# Patient Record
Sex: Male | Born: 1935 | Race: White | Hispanic: No | Smoking: Former smoker
Health system: Southern US, Community
[De-identification: ages and names within clinical notes are randomized; demographics above are authoritative.]

## PROBLEM LIST (undated history)

## (undated) DIAGNOSIS — N183 Chronic kidney disease, stage 3 unspecified: Secondary | ICD-10-CM

## (undated) DIAGNOSIS — C8590 Non-Hodgkin lymphoma, unspecified, unspecified site: Secondary | ICD-10-CM

## (undated) DIAGNOSIS — K2100 Gastro-esophageal reflux disease with esophagitis, without bleeding: Secondary | ICD-10-CM

## (undated) DIAGNOSIS — Z87442 Personal history of urinary calculi: Secondary | ICD-10-CM

## (undated) DIAGNOSIS — R05 Cough: Secondary | ICD-10-CM

## (undated) DIAGNOSIS — E538 Deficiency of other specified B group vitamins: Secondary | ICD-10-CM

## (undated) DIAGNOSIS — G4730 Sleep apnea, unspecified: Secondary | ICD-10-CM

## (undated) DIAGNOSIS — N1330 Unspecified hydronephrosis: Secondary | ICD-10-CM

## (undated) DIAGNOSIS — R059 Cough, unspecified: Secondary | ICD-10-CM

## (undated) DIAGNOSIS — H9190 Unspecified hearing loss, unspecified ear: Secondary | ICD-10-CM

## (undated) DIAGNOSIS — N400 Enlarged prostate without lower urinary tract symptoms: Secondary | ICD-10-CM

## (undated) DIAGNOSIS — J189 Pneumonia, unspecified organism: Secondary | ICD-10-CM

## (undated) DIAGNOSIS — D649 Anemia, unspecified: Secondary | ICD-10-CM

## (undated) DIAGNOSIS — Z87448 Personal history of other diseases of urinary system: Secondary | ICD-10-CM

## (undated) DIAGNOSIS — Q7649 Other congenital malformations of spine, not associated with scoliosis: Secondary | ICD-10-CM

## (undated) DIAGNOSIS — C8300 Small cell B-cell lymphoma, unspecified site: Secondary | ICD-10-CM

## (undated) DIAGNOSIS — K5790 Diverticulosis of intestine, part unspecified, without perforation or abscess without bleeding: Secondary | ICD-10-CM

## (undated) HISTORY — PX: INGUINAL HERNIA REPAIR: SUR1180

## (undated) HISTORY — PX: EYE SURGERY: SHX253

## (undated) HISTORY — DX: Small cell B-cell lymphoma, unspecified site: C83.00

## (undated) HISTORY — DX: Other congenital malformations of spine, not associated with scoliosis: Q76.49

## (undated) HISTORY — PX: PROSTATE SURGERY: SHX751

## (undated) HISTORY — DX: Gastro-esophageal reflux disease with esophagitis, without bleeding: K21.00

## (undated) HISTORY — DX: Personal history of other diseases of urinary system: Z87.448

## (undated) HISTORY — DX: Diverticulosis of intestine, part unspecified, without perforation or abscess without bleeding: K57.90

## (undated) HISTORY — DX: Unspecified hydronephrosis: N13.30

## (undated) HISTORY — DX: Benign prostatic hyperplasia without lower urinary tract symptoms: N40.0

## (undated) HISTORY — DX: Pneumonia, unspecified organism: J18.9

## (undated) HISTORY — PX: CHOLECYSTECTOMY: SHX55

## (undated) HISTORY — DX: Unspecified hearing loss, unspecified ear: H91.90

## (undated) HISTORY — DX: Anemia, unspecified: D64.9

## (undated) HISTORY — DX: Gastro-esophageal reflux disease with esophagitis: K21.0

## (undated) HISTORY — DX: Deficiency of other specified B group vitamins: E53.8

---

## 1999-12-12 ENCOUNTER — Other Ambulatory Visit: Admission: RE | Admit: 1999-12-12 | Discharge: 1999-12-12 | Payer: Self-pay | Admitting: Gastroenterology

## 1999-12-12 ENCOUNTER — Encounter (INDEPENDENT_AMBULATORY_CARE_PROVIDER_SITE_OTHER): Payer: Self-pay | Admitting: Specialist

## 1999-12-12 ENCOUNTER — Encounter (INDEPENDENT_AMBULATORY_CARE_PROVIDER_SITE_OTHER): Payer: Self-pay | Admitting: *Deleted

## 2002-04-28 ENCOUNTER — Emergency Department (HOSPITAL_COMMUNITY): Admission: EM | Admit: 2002-04-28 | Discharge: 2002-04-28 | Payer: Self-pay | Admitting: Emergency Medicine

## 2002-04-28 ENCOUNTER — Encounter: Payer: Self-pay | Admitting: Emergency Medicine

## 2004-10-04 ENCOUNTER — Encounter (INDEPENDENT_AMBULATORY_CARE_PROVIDER_SITE_OTHER): Payer: Self-pay | Admitting: Gastroenterology

## 2007-09-01 ENCOUNTER — Encounter: Admission: RE | Admit: 2007-09-01 | Discharge: 2007-09-01 | Payer: Self-pay | Admitting: Internal Medicine

## 2007-09-01 ENCOUNTER — Encounter: Payer: Self-pay | Admitting: Pulmonary Disease

## 2007-10-19 ENCOUNTER — Encounter (INDEPENDENT_AMBULATORY_CARE_PROVIDER_SITE_OTHER): Payer: Self-pay | Admitting: *Deleted

## 2007-10-19 ENCOUNTER — Ambulatory Visit (HOSPITAL_COMMUNITY): Admission: RE | Admit: 2007-10-19 | Discharge: 2007-10-19 | Payer: Self-pay | Admitting: *Deleted

## 2007-11-30 ENCOUNTER — Ambulatory Visit (HOSPITAL_COMMUNITY): Admission: RE | Admit: 2007-11-30 | Discharge: 2007-11-30 | Payer: Self-pay | Admitting: *Deleted

## 2008-04-12 ENCOUNTER — Encounter: Admission: RE | Admit: 2008-04-12 | Discharge: 2008-04-12 | Payer: Self-pay | Admitting: Internal Medicine

## 2008-04-21 ENCOUNTER — Ambulatory Visit (HOSPITAL_COMMUNITY): Admission: RE | Admit: 2008-04-21 | Discharge: 2008-04-21 | Payer: Self-pay | Admitting: Internal Medicine

## 2008-04-21 ENCOUNTER — Encounter (INDEPENDENT_AMBULATORY_CARE_PROVIDER_SITE_OTHER): Payer: Self-pay | Admitting: Interventional Radiology

## 2008-04-28 ENCOUNTER — Ambulatory Visit: Payer: Self-pay | Admitting: Oncology

## 2008-05-06 ENCOUNTER — Encounter (HOSPITAL_COMMUNITY): Payer: Self-pay | Admitting: Oncology

## 2008-05-06 ENCOUNTER — Other Ambulatory Visit: Admission: RE | Admit: 2008-05-06 | Discharge: 2008-05-06 | Payer: Self-pay | Admitting: Oncology

## 2008-05-27 LAB — COMPREHENSIVE METABOLIC PANEL
ALT: 11 U/L (ref 0–53)
CO2: 25 mEq/L (ref 19–32)
Sodium: 138 mEq/L (ref 135–145)
Total Bilirubin: 0.6 mg/dL (ref 0.3–1.2)
Total Protein: 6.1 g/dL (ref 6.0–8.3)

## 2008-05-27 LAB — CBC WITH DIFFERENTIAL/PLATELET
BASO%: 1.2 % (ref 0.0–2.0)
LYMPH%: 63.4 % — ABNORMAL HIGH (ref 14.0–48.0)
MCHC: 34.2 g/dL (ref 32.0–35.9)
MONO#: 0.7 10*3/uL (ref 0.1–0.9)
RBC: 4.53 10*6/uL (ref 4.20–5.71)
RDW: 14.9 % — ABNORMAL HIGH (ref 11.2–14.6)
WBC: 9.8 10*3/uL (ref 4.0–10.0)
lymph#: 6.2 10*3/uL — ABNORMAL HIGH (ref 0.9–3.3)

## 2008-05-27 LAB — LACTATE DEHYDROGENASE: LDH: 266 U/L — ABNORMAL HIGH (ref 94–250)

## 2008-06-07 LAB — CBC WITH DIFFERENTIAL/PLATELET
Basophils Absolute: 0 10*3/uL (ref 0.0–0.1)
EOS%: 0.6 % (ref 0.0–7.0)
HCT: 40.2 % (ref 38.7–49.9)
HGB: 14.1 g/dL (ref 13.0–17.1)
LYMPH%: 38 % (ref 14.0–48.0)
MCH: 31.2 pg (ref 28.0–33.4)
MCV: 89.1 fL (ref 81.6–98.0)
MONO%: 8.8 % (ref 0.0–13.0)
NEUT%: 52.2 % (ref 40.0–75.0)
RDW: 15.2 % — ABNORMAL HIGH (ref 11.2–14.6)

## 2008-06-14 ENCOUNTER — Ambulatory Visit: Payer: Self-pay | Admitting: Oncology

## 2008-06-14 LAB — CBC WITH DIFFERENTIAL/PLATELET
Basophils Absolute: 0 10*3/uL (ref 0.0–0.1)
Eosinophils Absolute: 0 10*3/uL (ref 0.0–0.5)
HCT: 38.7 % (ref 38.7–49.9)
HGB: 13.4 g/dL (ref 13.0–17.1)
MCH: 31.3 pg (ref 28.0–33.4)
MCV: 90.7 fL (ref 81.6–98.0)
MONO%: 6.6 % (ref 0.0–13.0)
NEUT#: 3.4 10*3/uL (ref 1.5–6.5)
NEUT%: 71.5 % (ref 40.0–75.0)
Platelets: 328 10*3/uL (ref 145–400)
RDW: 16.7 % — ABNORMAL HIGH (ref 11.2–14.6)

## 2008-06-21 LAB — CBC WITH DIFFERENTIAL/PLATELET
Basophils Absolute: 0 10*3/uL (ref 0.0–0.1)
EOS%: 0.3 % (ref 0.0–7.0)
Eosinophils Absolute: 0 10*3/uL (ref 0.0–0.5)
HCT: 37.7 % — ABNORMAL LOW (ref 38.7–49.9)
HGB: 13.1 g/dL (ref 13.0–17.1)
MCH: 31.6 pg (ref 28.0–33.4)
NEUT#: 1.4 10*3/uL — ABNORMAL LOW (ref 1.5–6.5)
NEUT%: 57 % (ref 40.0–75.0)
lymph#: 0.7 10*3/uL — ABNORMAL LOW (ref 0.9–3.3)

## 2008-06-27 LAB — CBC WITH DIFFERENTIAL/PLATELET
Basophils Absolute: 0.1 10*3/uL (ref 0.0–0.1)
EOS%: 0.4 % (ref 0.0–7.0)
Eosinophils Absolute: 0 10*3/uL (ref 0.0–0.5)
HCT: 37.7 % — ABNORMAL LOW (ref 38.7–49.9)
HGB: 12.7 g/dL — ABNORMAL LOW (ref 13.0–17.1)
LYMPH%: 25.7 % (ref 14.0–48.0)
MCH: 31.2 pg (ref 28.0–33.4)
MCV: 92.4 fL (ref 81.6–98.0)
MONO%: 15.1 % — ABNORMAL HIGH (ref 0.0–13.0)
NEUT%: 57.3 % (ref 40.0–75.0)
Platelets: 186 10*3/uL (ref 145–400)
RDW: 16.3 % — ABNORMAL HIGH (ref 11.2–14.6)

## 2008-06-27 LAB — COMPREHENSIVE METABOLIC PANEL
AST: 15 U/L (ref 0–37)
Alkaline Phosphatase: 72 U/L (ref 39–117)
BUN: 17 mg/dL (ref 6–23)
Creatinine, Ser: 1.04 mg/dL (ref 0.40–1.50)
Glucose, Bld: 117 mg/dL — ABNORMAL HIGH (ref 70–99)
Total Bilirubin: 0.5 mg/dL (ref 0.3–1.2)

## 2008-06-27 LAB — URIC ACID: Uric Acid, Serum: 6.2 mg/dL (ref 4.0–7.8)

## 2008-07-05 LAB — CBC WITH DIFFERENTIAL/PLATELET
BASO%: 0.1 % (ref 0.0–2.0)
Eosinophils Absolute: 0 10*3/uL (ref 0.0–0.5)
HCT: 39.5 % (ref 38.7–49.9)
LYMPH%: 7.1 % — ABNORMAL LOW (ref 14.0–48.0)
MONO#: 0.4 10*3/uL (ref 0.1–0.9)
NEUT#: 7.8 10*3/uL — ABNORMAL HIGH (ref 1.5–6.5)
NEUT%: 87.7 % — ABNORMAL HIGH (ref 40.0–75.0)
Platelets: 189 10*3/uL (ref 145–400)
WBC: 8.9 10*3/uL (ref 4.0–10.0)
lymph#: 0.6 10*3/uL — ABNORMAL LOW (ref 0.9–3.3)

## 2008-07-12 LAB — CBC WITH DIFFERENTIAL/PLATELET
Basophils Absolute: 0 10*3/uL (ref 0.0–0.1)
Eosinophils Absolute: 0 10*3/uL (ref 0.0–0.5)
HGB: 14.2 g/dL (ref 13.0–17.1)
MCV: 95.6 fL (ref 81.6–98.0)
MONO#: 0.4 10*3/uL (ref 0.1–0.9)
NEUT#: 6.1 10*3/uL (ref 1.5–6.5)
RDW: 19.3 % — ABNORMAL HIGH (ref 11.2–14.6)
WBC: 7.4 10*3/uL (ref 4.0–10.0)
lymph#: 0.8 10*3/uL — ABNORMAL LOW (ref 0.9–3.3)

## 2008-07-25 LAB — CBC WITH DIFFERENTIAL/PLATELET
BASO%: 1.8 % (ref 0.0–2.0)
EOS%: 0.8 % (ref 0.0–7.0)
HCT: 42.6 % (ref 38.7–49.9)
MCH: 32.8 pg (ref 28.0–33.4)
MCHC: 34.6 g/dL (ref 32.0–35.9)
MONO#: 1.1 10*3/uL — ABNORMAL HIGH (ref 0.1–0.9)
NEUT%: 58.7 % (ref 40.0–75.0)
RDW: 16.9 % — ABNORMAL HIGH (ref 11.2–14.6)
WBC: 5.4 10*3/uL (ref 4.0–10.0)
lymph#: 1.1 10*3/uL (ref 0.9–3.3)

## 2008-07-25 LAB — COMPREHENSIVE METABOLIC PANEL
ALT: 16 U/L (ref 0–53)
CO2: 24 mEq/L (ref 19–32)
Calcium: 9.2 mg/dL (ref 8.4–10.5)
Chloride: 102 mEq/L (ref 96–112)
Creatinine, Ser: 1.03 mg/dL (ref 0.40–1.50)
Glucose, Bld: 102 mg/dL — ABNORMAL HIGH (ref 70–99)
Sodium: 137 mEq/L (ref 135–145)
Total Bilirubin: 0.6 mg/dL (ref 0.3–1.2)
Total Protein: 6.3 g/dL (ref 6.0–8.3)

## 2008-07-25 LAB — LACTATE DEHYDROGENASE: LDH: 204 U/L (ref 94–250)

## 2008-07-25 LAB — URIC ACID: Uric Acid, Serum: 6.2 mg/dL (ref 4.0–7.8)

## 2008-08-04 ENCOUNTER — Ambulatory Visit: Payer: Self-pay | Admitting: Oncology

## 2008-08-08 LAB — CBC WITH DIFFERENTIAL/PLATELET
BASO%: 0 % (ref 0.0–2.0)
Basophils Absolute: 0 10*3/uL (ref 0.0–0.1)
EOS%: 0.2 % (ref 0.0–7.0)
Eosinophils Absolute: 0 10*3/uL (ref 0.0–0.5)
HCT: 42.5 % (ref 38.7–49.9)
HGB: 14.6 g/dL (ref 13.0–17.1)
LYMPH%: 5 % — ABNORMAL LOW (ref 14.0–48.0)
MCH: 33.9 pg — ABNORMAL HIGH (ref 28.0–33.4)
MCHC: 34.3 g/dL (ref 32.0–35.9)
MCV: 98.8 fL — ABNORMAL HIGH (ref 81.6–98.0)
MONO#: 0.5 10*3/uL (ref 0.1–0.9)
MONO%: 5.3 % (ref 0.0–13.0)
NEUT#: 7.7 10*3/uL — ABNORMAL HIGH (ref 1.5–6.5)
NEUT%: 89.5 % — ABNORMAL HIGH (ref 40.0–75.0)
Platelets: 180 10*3/uL (ref 145–400)
RBC: 4.3 10*6/uL (ref 4.20–5.71)
RDW: 19.5 % — ABNORMAL HIGH (ref 11.2–14.6)
WBC: 8.6 10*3/uL (ref 4.0–10.0)
lymph#: 0.4 10*3/uL — ABNORMAL LOW (ref 0.9–3.3)

## 2008-08-22 LAB — CBC WITH DIFFERENTIAL/PLATELET
BASO%: 2.9 % — ABNORMAL HIGH (ref 0.0–2.0)
EOS%: 0.6 % (ref 0.0–7.0)
HCT: 41.8 % (ref 38.7–49.9)
MCH: 33 pg (ref 28.0–33.4)
MCHC: 34.8 g/dL (ref 32.0–35.9)
NEUT%: 67.5 % (ref 40.0–75.0)
RDW: 16 % — ABNORMAL HIGH (ref 11.2–14.6)
lymph#: 0.6 10*3/uL — ABNORMAL LOW (ref 0.9–3.3)

## 2008-08-22 LAB — COMPREHENSIVE METABOLIC PANEL
ALT: 19 U/L (ref 0–53)
CO2: 23 mEq/L (ref 19–32)
Calcium: 9 mg/dL (ref 8.4–10.5)
Chloride: 107 mEq/L (ref 96–112)
Sodium: 139 mEq/L (ref 135–145)
Total Protein: 5.9 g/dL — ABNORMAL LOW (ref 6.0–8.3)

## 2008-08-22 LAB — LACTATE DEHYDROGENASE: LDH: 308 U/L — ABNORMAL HIGH (ref 94–250)

## 2008-09-05 LAB — CBC WITH DIFFERENTIAL/PLATELET
BASO%: 0.7 % (ref 0.0–2.0)
Basophils Absolute: 0.1 10*3/uL (ref 0.0–0.1)
EOS%: 0.2 % (ref 0.0–7.0)
HGB: 14.7 g/dL (ref 13.0–17.1)
MCH: 33.8 pg — ABNORMAL HIGH (ref 28.0–33.4)
RBC: 4.36 10*6/uL (ref 4.20–5.71)
RDW: 17.2 % — ABNORMAL HIGH (ref 11.2–14.6)
lymph#: 0.8 10*3/uL — ABNORMAL LOW (ref 0.9–3.3)

## 2008-09-14 ENCOUNTER — Ambulatory Visit (HOSPITAL_COMMUNITY): Admission: RE | Admit: 2008-09-14 | Discharge: 2008-09-14 | Payer: Self-pay | Admitting: Oncology

## 2008-09-16 LAB — COMPREHENSIVE METABOLIC PANEL
BUN: 16 mg/dL (ref 6–23)
CO2: 24 mEq/L (ref 19–32)
Calcium: 9.2 mg/dL (ref 8.4–10.5)
Chloride: 104 mEq/L (ref 96–112)
Creatinine, Ser: 1 mg/dL (ref 0.40–1.50)
Glucose, Bld: 113 mg/dL — ABNORMAL HIGH (ref 70–99)

## 2008-09-16 LAB — CBC WITH DIFFERENTIAL/PLATELET
Basophils Absolute: 0 10*3/uL (ref 0.0–0.1)
Eosinophils Absolute: 0 10*3/uL (ref 0.0–0.5)
HCT: 44.6 % (ref 38.7–49.9)
HGB: 15.4 g/dL (ref 13.0–17.1)
MCH: 33.9 pg — ABNORMAL HIGH (ref 28.0–33.4)
MONO#: 0.6 10*3/uL (ref 0.1–0.9)
NEUT%: 70.9 % (ref 40.0–75.0)
lymph#: 0.7 10*3/uL — ABNORMAL LOW (ref 0.9–3.3)

## 2008-09-16 LAB — LACTATE DEHYDROGENASE: LDH: 212 U/L (ref 94–250)

## 2008-09-19 ENCOUNTER — Ambulatory Visit: Payer: Self-pay | Admitting: Oncology

## 2008-10-17 LAB — COMPREHENSIVE METABOLIC PANEL
BUN: 16 mg/dL (ref 6–23)
CO2: 25 mEq/L (ref 19–32)
Calcium: 9 mg/dL (ref 8.4–10.5)
Chloride: 105 mEq/L (ref 96–112)
Creatinine, Ser: 1.09 mg/dL (ref 0.40–1.50)
Glucose, Bld: 96 mg/dL (ref 70–99)

## 2008-10-17 LAB — LACTATE DEHYDROGENASE: LDH: 205 U/L (ref 94–250)

## 2008-10-17 LAB — CBC WITH DIFFERENTIAL/PLATELET
Basophils Absolute: 0 10*3/uL (ref 0.0–0.1)
EOS%: 1.7 % (ref 0.0–7.0)
Eosinophils Absolute: 0 10*3/uL (ref 0.0–0.5)
HCT: 41.1 % (ref 38.7–49.9)
HGB: 14.3 g/dL (ref 13.0–17.1)
MCH: 32.1 pg (ref 28.0–33.4)
NEUT%: 49.5 % (ref 40.0–75.0)
lymph#: 0.4 10*3/uL — ABNORMAL LOW (ref 0.9–3.3)

## 2008-10-24 LAB — CBC WITH DIFFERENTIAL/PLATELET
Basophils Absolute: 0 10*3/uL (ref 0.0–0.1)
Eosinophils Absolute: 0 10*3/uL (ref 0.0–0.5)
HGB: 14.6 g/dL (ref 13.0–17.1)
NEUT#: 1 10*3/uL — ABNORMAL LOW (ref 1.5–6.5)
RBC: 4.45 10*6/uL (ref 4.20–5.71)
RDW: 14.1 % (ref 11.2–14.6)
WBC: 2.3 10*3/uL — ABNORMAL LOW (ref 4.0–10.0)
lymph#: 0.8 10*3/uL — ABNORMAL LOW (ref 0.9–3.3)

## 2008-10-31 LAB — CBC WITH DIFFERENTIAL/PLATELET
Basophils Absolute: 0 10*3/uL (ref 0.0–0.1)
Eosinophils Absolute: 0 10*3/uL (ref 0.0–0.5)
HGB: 14.4 g/dL (ref 13.0–17.1)
LYMPH%: 19.3 % (ref 14.0–48.0)
MCV: 93.6 fL (ref 81.6–98.0)
MONO#: 0.6 10*3/uL (ref 0.1–0.9)
MONO%: 18.3 % — ABNORMAL HIGH (ref 0.0–13.0)
NEUT#: 2 10*3/uL (ref 1.5–6.5)
Platelets: 195 10*3/uL (ref 145–400)
RDW: 13.8 % (ref 11.2–14.6)
WBC: 3.3 10*3/uL — ABNORMAL LOW (ref 4.0–10.0)

## 2008-11-07 ENCOUNTER — Ambulatory Visit: Payer: Self-pay | Admitting: Oncology

## 2008-11-07 LAB — CBC WITH DIFFERENTIAL/PLATELET
BASO%: 0.5 % (ref 0.0–2.0)
HCT: 43.8 % (ref 38.7–49.9)
HGB: 15.2 g/dL (ref 13.0–17.1)
MCHC: 34.7 g/dL (ref 32.0–35.9)
MONO#: 0.7 10*3/uL (ref 0.1–0.9)
NEUT%: 71.9 % (ref 40.0–75.0)
WBC: 5 10*3/uL (ref 4.0–10.0)
lymph#: 0.7 10*3/uL — ABNORMAL LOW (ref 0.9–3.3)

## 2008-11-14 LAB — CBC WITH DIFFERENTIAL/PLATELET
BASO%: 1.4 % (ref 0.0–2.0)
Basophils Absolute: 0 10*3/uL (ref 0.0–0.1)
Eosinophils Absolute: 0 10*3/uL (ref 0.0–0.5)
HCT: 40.7 % (ref 38.7–49.9)
HGB: 14.4 g/dL (ref 13.0–17.1)
MCHC: 35.2 g/dL (ref 32.0–35.9)
MONO#: 0.7 10*3/uL (ref 0.1–0.9)
NEUT#: 1 10*3/uL — ABNORMAL LOW (ref 1.5–6.5)
NEUT%: 41.3 % (ref 40.0–75.0)
WBC: 2.4 10*3/uL — ABNORMAL LOW (ref 4.0–10.0)
lymph#: 0.7 10*3/uL — ABNORMAL LOW (ref 0.9–3.3)

## 2008-11-23 ENCOUNTER — Ambulatory Visit (HOSPITAL_COMMUNITY): Admission: RE | Admit: 2008-11-23 | Discharge: 2008-11-23 | Payer: Self-pay | Admitting: Oncology

## 2008-11-30 LAB — CBC WITH DIFFERENTIAL/PLATELET
Basophils Absolute: 0.1 10*3/uL (ref 0.0–0.1)
Eosinophils Absolute: 0.1 10*3/uL (ref 0.0–0.5)
HCT: 44.6 % (ref 38.7–49.9)
HGB: 15.4 g/dL (ref 13.0–17.1)
MCV: 88.2 fL (ref 81.6–98.0)
MONO%: 17.5 % — ABNORMAL HIGH (ref 0.0–13.0)
NEUT#: 2.7 10*3/uL (ref 1.5–6.5)
RDW: 12.8 % (ref 11.2–14.6)

## 2008-11-30 LAB — COMPREHENSIVE METABOLIC PANEL
Albumin: 4 g/dL (ref 3.5–5.2)
CO2: 23 mEq/L (ref 19–32)
Glucose, Bld: 96 mg/dL (ref 70–99)
Potassium: 4.2 mEq/L (ref 3.5–5.3)
Sodium: 140 mEq/L (ref 135–145)
Total Bilirubin: 0.7 mg/dL (ref 0.3–1.2)
Total Protein: 6.1 g/dL (ref 6.0–8.3)

## 2008-11-30 LAB — URIC ACID: Uric Acid, Serum: 6.5 mg/dL (ref 4.0–7.8)

## 2009-01-06 ENCOUNTER — Ambulatory Visit: Payer: Self-pay | Admitting: Oncology

## 2009-01-10 LAB — CBC WITH DIFFERENTIAL/PLATELET
Basophils Absolute: 0 10*3/uL (ref 0.0–0.1)
EOS%: 1.9 % (ref 0.0–7.0)
HCT: 44 % (ref 38.7–49.9)
HGB: 15.3 g/dL (ref 13.0–17.1)
MCH: 30.8 pg (ref 28.0–33.4)
MCHC: 34.8 g/dL (ref 32.0–35.9)
MCV: 88.5 fL (ref 81.6–98.0)
MONO%: 11.2 % (ref 0.0–13.0)
NEUT%: 69.8 % (ref 40.0–75.0)
RDW: 14.6 % (ref 11.2–14.6)

## 2009-01-10 LAB — COMPREHENSIVE METABOLIC PANEL
AST: 21 U/L (ref 0–37)
Alkaline Phosphatase: 86 U/L (ref 39–117)
BUN: 18 mg/dL (ref 6–23)
Creatinine, Ser: 0.99 mg/dL (ref 0.40–1.50)

## 2009-02-21 ENCOUNTER — Ambulatory Visit: Payer: Self-pay | Admitting: Oncology

## 2009-02-21 LAB — COMPREHENSIVE METABOLIC PANEL
ALT: 28 U/L (ref 0–53)
Alkaline Phosphatase: 92 U/L (ref 39–117)
Glucose, Bld: 138 mg/dL — ABNORMAL HIGH (ref 70–99)
Sodium: 140 mEq/L (ref 135–145)
Total Bilirubin: 0.8 mg/dL (ref 0.3–1.2)
Total Protein: 6.2 g/dL (ref 6.0–8.3)

## 2009-02-21 LAB — CBC WITH DIFFERENTIAL/PLATELET
Basophils Absolute: 0 10*3/uL (ref 0.0–0.1)
EOS%: 1.5 % (ref 0.0–7.0)
Eosinophils Absolute: 0 10*3/uL (ref 0.0–0.5)
HCT: 46.9 % (ref 38.4–49.9)
HGB: 15.9 g/dL (ref 13.0–17.1)
LYMPH%: 30.7 % (ref 14.0–49.0)
MCHC: 33.9 g/dL (ref 32.0–36.0)
MONO#: 0.5 10*3/uL (ref 0.1–0.9)
NEUT#: 0.9 10*3/uL — ABNORMAL LOW (ref 1.5–6.5)
NEUT%: 43.9 % (ref 39.0–75.0)
Platelets: 125 10*3/uL — ABNORMAL LOW (ref 140–400)
WBC: 2.1 10*3/uL — ABNORMAL LOW (ref 4.0–10.3)
lymph#: 0.6 10*3/uL — ABNORMAL LOW (ref 0.9–3.3)
nRBC: 0 % (ref 0–0)

## 2009-02-21 LAB — LACTATE DEHYDROGENASE: LDH: 206 U/L (ref 94–250)

## 2009-03-22 ENCOUNTER — Ambulatory Visit (HOSPITAL_COMMUNITY): Admission: RE | Admit: 2009-03-22 | Discharge: 2009-03-22 | Payer: Self-pay | Admitting: Oncology

## 2009-03-27 LAB — CBC WITH DIFFERENTIAL/PLATELET
Eosinophils Absolute: 0.1 10*3/uL (ref 0.0–0.5)
HCT: 47.9 % (ref 38.4–49.9)
LYMPH%: 17.5 % (ref 14.0–49.0)
MCHC: 35.1 g/dL (ref 32.0–36.0)
MCV: 87.9 fL (ref 79.3–98.0)
MONO#: 0.8 10*3/uL (ref 0.1–0.9)
MONO%: 16.6 % — ABNORMAL HIGH (ref 0.0–14.0)
NEUT#: 3 10*3/uL (ref 1.5–6.5)
NEUT%: 64.2 % (ref 39.0–75.0)
Platelets: 147 10*3/uL (ref 140–400)
RBC: 5.45 10*6/uL (ref 4.20–5.82)

## 2009-03-27 LAB — COMPREHENSIVE METABOLIC PANEL
Alkaline Phosphatase: 91 U/L (ref 39–117)
BUN: 19 mg/dL (ref 6–23)
CO2: 26 mEq/L (ref 19–32)
Creatinine, Ser: 0.99 mg/dL (ref 0.40–1.50)
Glucose, Bld: 89 mg/dL (ref 70–99)
Sodium: 142 mEq/L (ref 135–145)
Total Bilirubin: 0.8 mg/dL (ref 0.3–1.2)
Total Protein: 6.3 g/dL (ref 6.0–8.3)

## 2009-03-27 LAB — LACTATE DEHYDROGENASE: LDH: 201 U/L (ref 94–250)

## 2009-06-22 ENCOUNTER — Ambulatory Visit: Payer: Self-pay | Admitting: Oncology

## 2009-06-26 ENCOUNTER — Ambulatory Visit (HOSPITAL_COMMUNITY): Admission: RE | Admit: 2009-06-26 | Discharge: 2009-06-26 | Payer: Self-pay | Admitting: Oncology

## 2009-06-26 ENCOUNTER — Encounter: Payer: Self-pay | Admitting: Pulmonary Disease

## 2009-06-26 LAB — CBC WITH DIFFERENTIAL/PLATELET
Basophils Absolute: 0 10*3/uL (ref 0.0–0.1)
EOS%: 0.6 % (ref 0.0–7.0)
HCT: 46.1 % (ref 38.4–49.9)
HGB: 15.7 g/dL (ref 13.0–17.1)
MCH: 30.4 pg (ref 27.2–33.4)
MONO#: 1.1 10*3/uL — ABNORMAL HIGH (ref 0.1–0.9)
NEUT%: 36.9 % — ABNORMAL LOW (ref 39.0–75.0)
Platelets: 163 10*3/uL (ref 140–400)
lymph#: 0.8 10*3/uL — ABNORMAL LOW (ref 0.9–3.3)

## 2009-07-17 ENCOUNTER — Ambulatory Visit: Payer: Self-pay | Admitting: Pulmonary Disease

## 2009-07-17 DIAGNOSIS — R05 Cough: Secondary | ICD-10-CM

## 2009-07-17 DIAGNOSIS — R059 Cough, unspecified: Secondary | ICD-10-CM | POA: Insufficient documentation

## 2009-07-25 ENCOUNTER — Ambulatory Visit: Payer: Self-pay | Admitting: Oncology

## 2009-07-26 ENCOUNTER — Encounter: Payer: Self-pay | Admitting: Pulmonary Disease

## 2009-07-26 ENCOUNTER — Encounter: Admission: RE | Admit: 2009-07-26 | Discharge: 2009-09-06 | Payer: Self-pay | Admitting: Pulmonary Disease

## 2009-07-27 LAB — CBC WITH DIFFERENTIAL/PLATELET
Basophils Absolute: 0 10*3/uL (ref 0.0–0.1)
Eosinophils Absolute: 0.1 10*3/uL (ref 0.0–0.5)
HCT: 42.1 % (ref 38.4–49.9)
HGB: 14.3 g/dL (ref 13.0–17.1)
LYMPH%: 20.1 % (ref 14.0–49.0)
MONO#: 1.2 10*3/uL — ABNORMAL HIGH (ref 0.1–0.9)
NEUT#: 2.4 10*3/uL (ref 1.5–6.5)
NEUT%: 51.6 % (ref 39.0–75.0)
Platelets: 214 10*3/uL (ref 140–400)
RBC: 4.75 10*6/uL (ref 4.20–5.82)
WBC: 4.7 10*3/uL (ref 4.0–10.3)
nRBC: 0 % (ref 0–0)

## 2009-07-27 LAB — COMPREHENSIVE METABOLIC PANEL
AST: 13 U/L (ref 0–37)
Alkaline Phosphatase: 78 U/L (ref 39–117)
BUN: 16 mg/dL (ref 6–23)
Creatinine, Ser: 0.94 mg/dL (ref 0.40–1.50)
Glucose, Bld: 101 mg/dL — ABNORMAL HIGH (ref 70–99)
Total Bilirubin: 0.5 mg/dL (ref 0.3–1.2)

## 2009-07-28 ENCOUNTER — Telehealth: Payer: Self-pay | Admitting: Pulmonary Disease

## 2009-08-30 ENCOUNTER — Ambulatory Visit: Payer: Self-pay | Admitting: Internal Medicine

## 2009-09-01 ENCOUNTER — Encounter: Payer: Self-pay | Admitting: Internal Medicine

## 2009-09-07 ENCOUNTER — Telehealth: Payer: Self-pay | Admitting: Pulmonary Disease

## 2009-09-11 ENCOUNTER — Ambulatory Visit: Payer: Self-pay | Admitting: Pulmonary Disease

## 2009-09-11 DIAGNOSIS — J4489 Other specified chronic obstructive pulmonary disease: Secondary | ICD-10-CM | POA: Insufficient documentation

## 2009-09-11 DIAGNOSIS — J449 Chronic obstructive pulmonary disease, unspecified: Secondary | ICD-10-CM

## 2009-09-26 ENCOUNTER — Telehealth: Payer: Self-pay | Admitting: Pulmonary Disease

## 2009-10-03 ENCOUNTER — Ambulatory Visit: Payer: Self-pay | Admitting: Gastroenterology

## 2009-10-03 ENCOUNTER — Encounter (INDEPENDENT_AMBULATORY_CARE_PROVIDER_SITE_OTHER): Payer: Self-pay | Admitting: *Deleted

## 2009-10-03 DIAGNOSIS — K220 Achalasia of cardia: Secondary | ICD-10-CM

## 2009-10-19 ENCOUNTER — Ambulatory Visit (HOSPITAL_COMMUNITY): Admission: RE | Admit: 2009-10-19 | Discharge: 2009-10-19 | Payer: Self-pay | Admitting: Gastroenterology

## 2009-10-19 ENCOUNTER — Ambulatory Visit: Payer: Self-pay | Admitting: Gastroenterology

## 2009-10-25 ENCOUNTER — Ambulatory Visit: Payer: Self-pay | Admitting: Oncology

## 2009-10-27 LAB — COMPREHENSIVE METABOLIC PANEL
AST: 29 U/L (ref 0–37)
Albumin: 3.7 g/dL (ref 3.5–5.2)
Alkaline Phosphatase: 127 U/L — ABNORMAL HIGH (ref 39–117)
Calcium: 8.9 mg/dL (ref 8.4–10.5)
Chloride: 103 mEq/L (ref 96–112)
Potassium: 3.9 mEq/L (ref 3.5–5.3)
Sodium: 140 mEq/L (ref 135–145)
Total Protein: 5.8 g/dL — ABNORMAL LOW (ref 6.0–8.3)

## 2009-10-27 LAB — CBC WITH DIFFERENTIAL/PLATELET
Basophils Absolute: 0 10*3/uL (ref 0.0–0.1)
EOS%: 3.1 % (ref 0.0–7.0)
Eosinophils Absolute: 0.2 10*3/uL (ref 0.0–0.5)
HGB: 15 g/dL (ref 13.0–17.1)
MCH: 30.1 pg (ref 27.2–33.4)
MCV: 89.9 fL (ref 79.3–98.0)
MONO%: 9.1 % (ref 0.0–14.0)
NEUT#: 5.4 10*3/uL (ref 1.5–6.5)
RBC: 4.99 10*6/uL (ref 4.20–5.82)
RDW: 15.8 % — ABNORMAL HIGH (ref 11.0–14.6)
lymph#: 1 10*3/uL (ref 0.9–3.3)

## 2009-11-21 ENCOUNTER — Ambulatory Visit: Payer: Self-pay | Admitting: Gastroenterology

## 2010-01-24 ENCOUNTER — Ambulatory Visit: Payer: Self-pay | Admitting: Oncology

## 2010-01-26 LAB — CBC WITH DIFFERENTIAL/PLATELET
Eosinophils Absolute: 0.2 10*3/uL (ref 0.0–0.5)
HCT: 43.2 % (ref 38.4–49.9)
HGB: 14.3 g/dL (ref 13.0–17.1)
MCH: 29.7 pg (ref 27.2–33.4)
MCHC: 33.1 g/dL (ref 32.0–36.0)
MCV: 89.6 fL (ref 79.3–98.0)
NEUT#: 6.8 10*3/uL — ABNORMAL HIGH (ref 1.5–6.5)
Platelets: 209 10*3/uL (ref 140–400)
RDW: 14.7 % — ABNORMAL HIGH (ref 11.0–14.6)
lymph#: 0.9 10*3/uL (ref 0.9–3.3)

## 2010-01-26 LAB — COMPREHENSIVE METABOLIC PANEL
BUN: 22 mg/dL (ref 6–23)
Calcium: 8.9 mg/dL (ref 8.4–10.5)
Chloride: 104 mEq/L (ref 96–112)
Creatinine, Ser: 0.93 mg/dL (ref 0.40–1.50)

## 2010-03-04 ENCOUNTER — Inpatient Hospital Stay (HOSPITAL_COMMUNITY): Admission: EM | Admit: 2010-03-04 | Discharge: 2010-03-07 | Payer: Self-pay | Admitting: Emergency Medicine

## 2010-03-05 ENCOUNTER — Encounter (INDEPENDENT_AMBULATORY_CARE_PROVIDER_SITE_OTHER): Payer: Self-pay | Admitting: Internal Medicine

## 2010-03-05 ENCOUNTER — Ambulatory Visit: Payer: Self-pay | Admitting: Vascular Surgery

## 2010-03-06 ENCOUNTER — Encounter (INDEPENDENT_AMBULATORY_CARE_PROVIDER_SITE_OTHER): Payer: Self-pay | Admitting: Internal Medicine

## 2010-03-07 ENCOUNTER — Ambulatory Visit: Payer: Self-pay | Admitting: Oncology

## 2010-03-14 ENCOUNTER — Encounter: Admission: RE | Admit: 2010-03-14 | Discharge: 2010-04-16 | Payer: Self-pay | Admitting: Internal Medicine

## 2010-04-24 ENCOUNTER — Ambulatory Visit: Payer: Self-pay | Admitting: Oncology

## 2010-04-26 LAB — CBC WITH DIFFERENTIAL/PLATELET
Basophils Absolute: 0 10*3/uL (ref 0.0–0.1)
EOS%: 6.2 % (ref 0.0–7.0)
MONO#: 1 10*3/uL — ABNORMAL HIGH (ref 0.1–0.9)
Platelets: 169 10*3/uL (ref 140–400)
RBC: 5.16 10*6/uL (ref 4.20–5.82)
RDW: 14.9 % — ABNORMAL HIGH (ref 11.0–14.6)
WBC: 7.6 10*3/uL (ref 4.0–10.3)
nRBC: 0 % (ref 0–0)

## 2010-04-26 LAB — COMPREHENSIVE METABOLIC PANEL
AST: 16 U/L (ref 0–37)
CO2: 24 mEq/L (ref 19–32)
Chloride: 104 mEq/L (ref 96–112)
Creatinine, Ser: 0.92 mg/dL (ref 0.40–1.50)
Potassium: 4 mEq/L (ref 3.5–5.3)

## 2010-07-25 ENCOUNTER — Ambulatory Visit: Payer: Self-pay | Admitting: Oncology

## 2010-07-27 LAB — CBC WITH DIFFERENTIAL/PLATELET
Basophils Absolute: 0 10*3/uL (ref 0.0–0.1)
Eosinophils Absolute: 0.2 10*3/uL (ref 0.0–0.5)
HGB: 14.7 g/dL (ref 13.0–17.1)
MCHC: 32.7 g/dL (ref 32.0–36.0)
MCV: 89.8 fL (ref 79.3–98.0)
MONO#: 0.9 10*3/uL (ref 0.1–0.9)
Platelets: 152 10*3/uL (ref 140–400)
RDW: 15.1 % — ABNORMAL HIGH (ref 11.0–14.6)
lymph#: 1.2 10*3/uL (ref 0.9–3.3)

## 2010-07-27 LAB — COMPREHENSIVE METABOLIC PANEL
ALT: 13 U/L (ref 0–53)
Alkaline Phosphatase: 99 U/L (ref 39–117)
CO2: 27 mEq/L (ref 19–32)
Calcium: 8.7 mg/dL (ref 8.4–10.5)
Chloride: 105 mEq/L (ref 96–112)
Sodium: 141 mEq/L (ref 135–145)
Total Protein: 5.6 g/dL — ABNORMAL LOW (ref 6.0–8.3)

## 2010-10-16 ENCOUNTER — Ambulatory Visit: Payer: Self-pay | Admitting: Oncology

## 2010-10-18 ENCOUNTER — Encounter: Payer: Self-pay | Admitting: Pulmonary Disease

## 2010-10-18 LAB — CBC WITH DIFFERENTIAL/PLATELET
BASO%: 0.3 % (ref 0.0–2.0)
Eosinophils Absolute: 0.2 10*3/uL (ref 0.0–0.5)
HCT: 45 % (ref 38.4–49.9)
HGB: 14.9 g/dL (ref 13.0–17.1)
LYMPH%: 13.6 % — ABNORMAL LOW (ref 14.0–49.0)
MCV: 90.5 fL (ref 79.3–98.0)
MONO%: 11.9 % (ref 0.0–14.0)
NEUT#: 5.5 10*3/uL (ref 1.5–6.5)
RDW: 15 % — ABNORMAL HIGH (ref 11.0–14.6)
WBC: 7.6 10*3/uL (ref 4.0–10.3)
nRBC: 0 % (ref 0–0)

## 2010-10-19 LAB — URIC ACID: Uric Acid, Serum: 6 mg/dL (ref 4.0–7.8)

## 2010-10-19 LAB — LACTATE DEHYDROGENASE: LDH: 145 U/L (ref 94–250)

## 2010-10-19 LAB — COMPREHENSIVE METABOLIC PANEL
Calcium: 9.2 mg/dL (ref 8.4–10.5)
Glucose, Bld: 155 mg/dL — ABNORMAL HIGH (ref 70–99)
Potassium: 4.2 mEq/L (ref 3.5–5.3)

## 2010-10-30 ENCOUNTER — Encounter: Admission: RE | Admit: 2010-10-30 | Discharge: 2010-10-30 | Payer: Self-pay | Admitting: Allergy

## 2010-11-30 ENCOUNTER — Ambulatory Visit: Payer: Self-pay | Admitting: Gastroenterology

## 2010-12-30 ENCOUNTER — Encounter: Payer: Self-pay | Admitting: Internal Medicine

## 2011-01-10 NOTE — Letter (Signed)
Summary: [facility]  [facility]   Imported By: Lennie Odor 11/02/2010 12:39:42  _____________________________________________________________________  External Attachment:    Type:   Image     Comment:   External Document

## 2011-01-10 NOTE — Assessment & Plan Note (Signed)
  Review of gastrointestinal problems: 1. Presumed achalasia. Previously a patient of Dr. Virginia Rochester with extensive workup (Failed manometry testing, the catheter coiled in his esophagus)  . Most recent EGD November, 2010 found tight GE junction, slightly dilated distal esophagus, findings consistent with achalasia, botulinum toxin was injected.  Injection improved his swallowing significantly however he is still careful about what he eats, eats slowly, takes small bites, chews his food very well.   History of Present Illness Visit Type: Follow-up Visit Primary GI MD: Rob Bunting MD Primary Provider: Pearson Grippe, MD  Requesting Provider: n/a Chief Complaint: Follow up for  reflux and cough History of Present Illness:     very pleasant 77 year old man whom I last saw over a year ago. Recently he was found to sinusitis, perhaps contributing to his cough.  His swallowing is "alright," .  Swallowing is no longer a problem for him. He eats slowly, chews his food well.  the cough is usually worse at night, BEFORE he lays down.  Eating does not cause him to cough.           Current Medications (verified): 1)  Glucosamine 500 Mg Caps (Glucosamine Sulfate) .... Once Daily 2)  Prevacid 30 Mg Cpdr (Lansoprazole) .... 2 Once Daily 3)  Finasteride 5 Mg Tabs (Finasteride) .Marland Kitchen.. 1 By Mouth Once Daily 4)  Avelox 400 Mg Tabs (Moxifloxacin Hcl) .Marland Kitchen.. 1 By Mouth Once Daily  Allergies (verified): No Known Drug Allergies  Vital Signs:  Patient profile:   77 year old male Height:      65 inches Weight:      141 pounds BMI:     23.55 BSA:     1.71 Pulse rate:   80 / minute Pulse rhythm:   irregular BP sitting:   92 / 52  (left arm)  Vitals Entered By: Merri Ray CMA Duncan Dull) (November 30, 2010 3:24 PM)  Physical Exam  Additional Exam:  Constitutional: generally well appearing Psychiatric: alert and oriented times 3 Abdomen: soft, non-tender, non-distended, normal bowel sounds    Impression &  Recommendations:  Problem # 1:  Achalasia he did not have any significant swallowing difficulties anymore. If he does, he will call and I would like to repeat EGD with Botox injection. It is not clear if his cough, which is the biggest complaint, is related to his achalasia. Recently he was told he had sinusitis and is finishing antibiotics for it.  Patient Instructions: 1)  should stay on prevacid once daily. 2)  Call Dr. Christella Hartigan if you start to have swallowing troubles again. 3)  A copy of this information will be sent to Dr. Selena Batten. 4)  The medication list was reviewed and reconciled.  All changed / newly prescribed medications were explained.  A complete medication list was provided to the patient / caregiver.

## 2011-01-18 ENCOUNTER — Other Ambulatory Visit (HOSPITAL_COMMUNITY): Payer: Self-pay | Admitting: Oncology

## 2011-01-18 ENCOUNTER — Encounter (HOSPITAL_BASED_OUTPATIENT_CLINIC_OR_DEPARTMENT_OTHER): Payer: Medicare Other | Admitting: Oncology

## 2011-01-18 DIAGNOSIS — Z5112 Encounter for antineoplastic immunotherapy: Secondary | ICD-10-CM

## 2011-01-18 DIAGNOSIS — C8589 Other specified types of non-Hodgkin lymphoma, extranodal and solid organ sites: Secondary | ICD-10-CM

## 2011-01-18 DIAGNOSIS — C8590 Non-Hodgkin lymphoma, unspecified, unspecified site: Secondary | ICD-10-CM

## 2011-01-18 LAB — CBC WITH DIFFERENTIAL/PLATELET
LYMPH%: 17.1 % (ref 14.0–49.0)
MCH: 29.6 pg (ref 27.2–33.4)
NEUT#: 6.9 10*3/uL — ABNORMAL HIGH (ref 1.5–6.5)
Platelets: 215 10*3/uL (ref 140–400)

## 2011-01-18 LAB — COMPREHENSIVE METABOLIC PANEL
Albumin: 3.8 g/dL (ref 3.5–5.2)
Alkaline Phosphatase: 119 U/L — ABNORMAL HIGH (ref 39–117)
BUN: 14 mg/dL (ref 6–23)
CO2: 27 mEq/L (ref 19–32)
Chloride: 100 mEq/L (ref 96–112)
Creatinine, Ser: 0.95 mg/dL (ref 0.40–1.50)
Glucose, Bld: 133 mg/dL — ABNORMAL HIGH (ref 70–99)
Potassium: 4.1 mEq/L (ref 3.5–5.3)
Total Bilirubin: 0.7 mg/dL (ref 0.3–1.2)

## 2011-01-18 LAB — URIC ACID: Uric Acid, Serum: 5.8 mg/dL (ref 4.0–7.8)

## 2011-02-12 ENCOUNTER — Other Ambulatory Visit: Payer: Self-pay | Admitting: Dermatology

## 2011-02-15 ENCOUNTER — Other Ambulatory Visit: Payer: Self-pay | Admitting: Allergy

## 2011-02-15 ENCOUNTER — Ambulatory Visit
Admission: RE | Admit: 2011-02-15 | Discharge: 2011-02-15 | Disposition: A | Discharge status: Home / Self Care | Payer: Medicare Other | Source: Ambulatory Visit | Attending: Allergy | Admitting: Allergy

## 2011-02-15 DIAGNOSIS — R05 Cough: Secondary | ICD-10-CM

## 2011-03-03 LAB — CBC
HCT: 38.1 % — ABNORMAL LOW (ref 39.0–52.0)
Hemoglobin: 13.3 g/dL (ref 13.0–17.0)
MCHC: 34.3 g/dL (ref 30.0–36.0)
MCV: 90.1 fL (ref 78.0–100.0)
Platelets: 199 10*3/uL (ref 150–400)
RBC: 4.3 MIL/uL (ref 4.22–5.81)
RDW: 15.3 % (ref 11.5–15.5)
WBC: 5.5 10*3/uL (ref 4.0–10.5)

## 2011-03-03 LAB — LIPID PANEL
Cholesterol: 123 mg/dL (ref 0–200)
Total CHOL/HDL Ratio: 2.6 RATIO

## 2011-03-03 LAB — CULTURE, BLOOD (ROUTINE X 2): Culture: NO GROWTH

## 2011-03-03 LAB — T3: T3, Total: 72.3 ng/dl — ABNORMAL LOW (ref 80.0–204.0)

## 2011-03-03 LAB — COMPREHENSIVE METABOLIC PANEL
ALT: 32 U/L (ref 0–53)
Albumin: 2.7 g/dL — ABNORMAL LOW (ref 3.5–5.2)
Alkaline Phosphatase: 112 U/L (ref 39–117)
BUN: 12 mg/dL (ref 6–23)
Chloride: 102 mEq/L (ref 96–112)
Glucose, Bld: 124 mg/dL — ABNORMAL HIGH (ref 70–99)
Potassium: 4.1 mEq/L (ref 3.5–5.1)
Sodium: 135 mEq/L (ref 135–145)
Total Bilirubin: 0.6 mg/dL (ref 0.3–1.2)

## 2011-03-03 LAB — DIFFERENTIAL
Basophils Absolute: 0.1 10*3/uL (ref 0.0–0.1)
Basophils Relative: 1 % (ref 0–1)
Lymphocytes Relative: 10 % — ABNORMAL LOW (ref 12–46)
Neutro Abs: 8.9 10*3/uL — ABNORMAL HIGH (ref 1.7–7.7)

## 2011-03-03 LAB — POCT I-STAT, CHEM 8
BUN: 15 mg/dL (ref 6–23)
Creatinine, Ser: 0.9 mg/dL (ref 0.4–1.5)
Sodium: 137 mEq/L (ref 135–145)
TCO2: 28 mmol/L (ref 0–100)

## 2011-03-03 LAB — T4, FREE: Free T4: 1.03 ng/dL (ref 0.80–1.80)

## 2011-04-15 ENCOUNTER — Other Ambulatory Visit (HOSPITAL_COMMUNITY): Payer: Self-pay | Admitting: Oncology

## 2011-04-15 ENCOUNTER — Encounter (HOSPITAL_BASED_OUTPATIENT_CLINIC_OR_DEPARTMENT_OTHER): Payer: Medicare Other | Admitting: Oncology

## 2011-04-15 ENCOUNTER — Encounter (HOSPITAL_COMMUNITY): Payer: Self-pay

## 2011-04-15 ENCOUNTER — Ambulatory Visit (HOSPITAL_COMMUNITY)
Admission: RE | Admit: 2011-04-15 | Discharge: 2011-04-15 | Disposition: A | Discharge status: Home / Self Care | Payer: Medicare Other | Source: Ambulatory Visit | Attending: Oncology | Admitting: Oncology

## 2011-04-15 DIAGNOSIS — C8599 Non-Hodgkin lymphoma, unspecified, extranodal and solid organ sites: Secondary | ICD-10-CM

## 2011-04-15 DIAGNOSIS — R599 Enlarged lymph nodes, unspecified: Secondary | ICD-10-CM | POA: Insufficient documentation

## 2011-04-15 DIAGNOSIS — C8590 Non-Hodgkin lymphoma, unspecified, unspecified site: Secondary | ICD-10-CM

## 2011-04-15 DIAGNOSIS — N200 Calculus of kidney: Secondary | ICD-10-CM | POA: Insufficient documentation

## 2011-04-15 DIAGNOSIS — C8589 Other specified types of non-Hodgkin lymphoma, extranodal and solid organ sites: Secondary | ICD-10-CM

## 2011-04-15 DIAGNOSIS — N1330 Unspecified hydronephrosis: Secondary | ICD-10-CM | POA: Insufficient documentation

## 2011-04-15 DIAGNOSIS — N400 Enlarged prostate without lower urinary tract symptoms: Secondary | ICD-10-CM | POA: Insufficient documentation

## 2011-04-15 HISTORY — DX: Non-Hodgkin lymphoma, unspecified, unspecified site: C85.90

## 2011-04-15 LAB — CMP (CANCER CENTER ONLY)
AST: 24 U/L (ref 11–38)
Albumin: 3.2 g/dL — ABNORMAL LOW (ref 3.3–5.5)
BUN, Bld: 14 mg/dL (ref 7–22)
CO2: 29 mEq/L (ref 18–33)
Calcium: 9 mg/dL (ref 8.0–10.3)
Chloride: 101 mEq/L (ref 98–108)
Creat: 0.7 mg/dl (ref 0.6–1.2)
Potassium: 4.5 mEq/L (ref 3.3–4.7)

## 2011-04-15 LAB — CBC WITH DIFFERENTIAL/PLATELET
Basophils Absolute: 0 10*3/uL (ref 0.0–0.1)
Eosinophils Absolute: 0.1 10*3/uL (ref 0.0–0.5)
HCT: 46 % (ref 38.4–49.9)
HGB: 15.4 g/dL (ref 13.0–17.1)
MCH: 29.5 pg (ref 27.2–33.4)
MONO#: 0.7 10*3/uL (ref 0.1–0.9)
NEUT#: 5.6 10*3/uL (ref 1.5–6.5)
NEUT%: 74 % (ref 39.0–75.0)
RDW: 17.4 % — ABNORMAL HIGH (ref 11.0–14.6)
WBC: 7.5 10*3/uL (ref 4.0–10.3)
lymph#: 1.2 10*3/uL (ref 0.9–3.3)

## 2011-04-15 LAB — LACTATE DEHYDROGENASE: LDH: 156 U/L (ref 94–250)

## 2011-04-15 MED ORDER — IOHEXOL 300 MG/ML  SOLN
100.0000 mL | Freq: Once | INTRAMUSCULAR | Status: AC | PRN
  Administered 2011-04-15: 100 mL via INTRAVENOUS

## 2011-04-19 ENCOUNTER — Encounter (HOSPITAL_BASED_OUTPATIENT_CLINIC_OR_DEPARTMENT_OTHER): Payer: Medicare Other | Admitting: Oncology

## 2011-04-19 DIAGNOSIS — Z5112 Encounter for antineoplastic immunotherapy: Secondary | ICD-10-CM

## 2011-04-19 DIAGNOSIS — R609 Edema, unspecified: Secondary | ICD-10-CM

## 2011-04-19 DIAGNOSIS — C8589 Other specified types of non-Hodgkin lymphoma, extranodal and solid organ sites: Secondary | ICD-10-CM

## 2011-04-23 NOTE — Op Note (Signed)
NAME:  IVAL, PACER NO.:  [id number]   MEDICAL RECORD NO.:  [id number]          PATIENT TYPE:  AMB   LOCATION:  ENDO                         FACILITY:  [facility]   PHYSICIAN:  Georgiana Spinner, M.D.    DATE OF BIRTH:  1918-12-18   DATE OF PROCEDURE:  10/19/2007  DATE OF DISCHARGE:                               OPERATIVE REPORT   PROCEDURE:  Upper endoscopy with dilation.   INDICATIONS:  Dysphagia with abnormal barium swallow indicating a near  complete obstruction of the distal esophagus with diffuse esophageal  spasm.   ANESTHESIA:  Fentanyl 50 mcg, Versed 6 mg.   DESCRIPTION OF PROCEDURE:  With the patient mildly sedated in the left  lateral decubitus position the Pentax videoscopic endoscope was inserted  in the mouth passed under direct vision into the esophagus.  It was  noted that he did have spasm as we advanced the endoscope through the  esophagus areas would open and close in front of [place].  As we got to the  distal esophagus it appeared to be fairly tightly closed, but we were  able to get the endoscope through it with a pop. We advanced through  the stomach, fundus, body, antrum, duodenal bulb, second portion of the  duodenum all appeared normal.  Photographs were taken.  From this point  the endoscope was slowly withdrawn taking circumferential views of  duodenal mucosa until the endoscope had been pulled back, and into  stomach placed in retroflexion to view the stomach from below.  The  endoscope was then straightened and the guidewire was passed.  The  endoscope was withdrawn.  Subsequently Savary dilators 10 and 12 were  passed.  With the latter I noted some resistance so I elected to  withdraw the guidewire with it and reinserted the endoscope.  There  appeared to be some blood in the distal esophagus.  No other pathology  noted, and the endoscope was withdrawn.  The patient's vital signs and  pulse oximeter remained stable.  The patient tolerated  procedure well  without apparent complications.   FINDINGS:  Dilation of the distal esophageal narrowing to 10 and 12  Savary.   PLAN:  Will schedule the patient for esophageal manometry to evaluate  further and have patient follow up with me as an outpatient.  Will  assess clinical response to dilation as well.           ______________________________  Georgiana Spinner, M.D.     GMO/MEDQ  D:  10/19/2007  T:  10/19/2007  Job:  [id number]

## 2011-08-13 ENCOUNTER — Other Ambulatory Visit (HOSPITAL_COMMUNITY): Payer: Self-pay | Admitting: Oncology

## 2011-08-13 ENCOUNTER — Encounter (HOSPITAL_BASED_OUTPATIENT_CLINIC_OR_DEPARTMENT_OTHER): Payer: Medicare Other | Admitting: Oncology

## 2011-08-13 DIAGNOSIS — C8589 Other specified types of non-Hodgkin lymphoma, extranodal and solid organ sites: Secondary | ICD-10-CM

## 2011-08-13 DIAGNOSIS — Z5112 Encounter for antineoplastic immunotherapy: Secondary | ICD-10-CM

## 2011-08-13 DIAGNOSIS — R05 Cough: Secondary | ICD-10-CM

## 2011-08-13 LAB — COMPREHENSIVE METABOLIC PANEL
AST: 24 U/L (ref 0–37)
Albumin: 3.3 g/dL — ABNORMAL LOW (ref 3.5–5.2)
BUN: 18 mg/dL (ref 6–23)
Calcium: 8.8 mg/dL (ref 8.4–10.5)
Chloride: 103 mEq/L (ref 96–112)
Creatinine, Ser: 0.97 mg/dL (ref 0.50–1.35)
Glucose, Bld: 133 mg/dL — ABNORMAL HIGH (ref 70–99)
Potassium: 4 mEq/L (ref 3.5–5.3)

## 2011-08-13 LAB — CBC WITH DIFFERENTIAL/PLATELET
BASO%: 0.5 % (ref 0.0–2.0)
Basophils Absolute: 0.1 10*3/uL (ref 0.0–0.1)
HCT: 41.2 % (ref 38.4–49.9)
HGB: 13.7 g/dL (ref 13.0–17.1)
LYMPH%: 14.2 % (ref 14.0–49.0)
MCH: 29.5 pg (ref 27.2–33.4)
MCHC: 33.3 g/dL (ref 32.0–36.0)
MONO#: 0.9 10*3/uL (ref 0.1–0.9)
NEUT%: 75 % (ref 39.0–75.0)
Platelets: 212 10*3/uL (ref 140–400)
WBC: 10.5 10*3/uL — ABNORMAL HIGH (ref 4.0–10.3)
lymph#: 1.5 10*3/uL (ref 0.9–3.3)

## 2011-08-14 ENCOUNTER — Encounter (HOSPITAL_BASED_OUTPATIENT_CLINIC_OR_DEPARTMENT_OTHER): Payer: Medicare Other | Admitting: Oncology

## 2011-08-14 DIAGNOSIS — C8589 Other specified types of non-Hodgkin lymphoma, extranodal and solid organ sites: Secondary | ICD-10-CM

## 2011-08-14 DIAGNOSIS — Z5112 Encounter for antineoplastic immunotherapy: Secondary | ICD-10-CM

## 2011-11-04 ENCOUNTER — Other Ambulatory Visit: Payer: Self-pay | Admitting: Dermatology

## 2011-11-08 ENCOUNTER — Other Ambulatory Visit: Payer: Self-pay | Admitting: Nurse Practitioner

## 2011-11-10 ENCOUNTER — Other Ambulatory Visit: Payer: Self-pay | Admitting: Oncology

## 2011-11-10 DIAGNOSIS — C8300 Small cell B-cell lymphoma, unspecified site: Secondary | ICD-10-CM | POA: Insufficient documentation

## 2011-11-11 ENCOUNTER — Encounter: Payer: Self-pay | Admitting: Oncology

## 2011-11-12 ENCOUNTER — Other Ambulatory Visit: Payer: Self-pay | Admitting: *Deleted

## 2011-11-12 ENCOUNTER — Ambulatory Visit (HOSPITAL_BASED_OUTPATIENT_CLINIC_OR_DEPARTMENT_OTHER): Payer: Medicare Other

## 2011-11-12 ENCOUNTER — Other Ambulatory Visit (HOSPITAL_BASED_OUTPATIENT_CLINIC_OR_DEPARTMENT_OTHER): Payer: Medicare Other | Admitting: Lab

## 2011-11-12 ENCOUNTER — Telehealth: Payer: Self-pay | Admitting: Oncology

## 2011-11-12 ENCOUNTER — Ambulatory Visit (HOSPITAL_BASED_OUTPATIENT_CLINIC_OR_DEPARTMENT_OTHER): Payer: Medicare Other | Admitting: Oncology

## 2011-11-12 VITALS — BP 105/59 | HR 71 | Temp 96.8°F | Ht 63.5 in | Wt 141.0 lb

## 2011-11-12 VITALS — BP 108/68 | HR 84 | Temp 97.5°F | Ht 63.5 in | Wt 141.4 lb

## 2011-11-12 DIAGNOSIS — J449 Chronic obstructive pulmonary disease, unspecified: Secondary | ICD-10-CM

## 2011-11-12 DIAGNOSIS — C8300 Small cell B-cell lymphoma, unspecified site: Secondary | ICD-10-CM

## 2011-11-12 DIAGNOSIS — C8583 Other specified types of non-Hodgkin lymphoma, intra-abdominal lymph nodes: Secondary | ICD-10-CM

## 2011-11-12 DIAGNOSIS — Z5112 Encounter for antineoplastic immunotherapy: Secondary | ICD-10-CM

## 2011-11-12 DIAGNOSIS — R05 Cough: Secondary | ICD-10-CM

## 2011-11-12 DIAGNOSIS — C8599 Non-Hodgkin lymphoma, unspecified, extranodal and solid organ sites: Secondary | ICD-10-CM

## 2011-11-12 LAB — COMPREHENSIVE METABOLIC PANEL
BUN: 23 mg/dL (ref 6–23)
CO2: 25 mEq/L (ref 19–32)
Calcium: 8.6 mg/dL (ref 8.4–10.5)
Chloride: 105 mEq/L (ref 96–112)
Creatinine, Ser: 1.06 mg/dL (ref 0.50–1.35)

## 2011-11-12 LAB — CBC WITH DIFFERENTIAL/PLATELET
Basophils Absolute: 0 10*3/uL (ref 0.0–0.1)
EOS%: 0.9 % (ref 0.0–7.0)
Eosinophils Absolute: 0.1 10*3/uL (ref 0.0–0.5)
HCT: 42.1 % (ref 38.4–49.9)
HGB: 13.7 g/dL (ref 13.0–17.1)
MCH: 28.8 pg (ref 27.2–33.4)
MONO#: 1.6 10*3/uL — ABNORMAL HIGH (ref 0.1–0.9)
NEUT#: 11 10*3/uL — ABNORMAL HIGH (ref 1.5–6.5)
NEUT%: 75.8 % — ABNORMAL HIGH (ref 39.0–75.0)
RDW: 15 % — ABNORMAL HIGH (ref 11.0–14.6)
WBC: 14.5 10*3/uL — ABNORMAL HIGH (ref 4.0–10.3)
lymph#: 1.7 10*3/uL (ref 0.9–3.3)

## 2011-11-12 LAB — LACTATE DEHYDROGENASE: LDH: 286 U/L — ABNORMAL HIGH (ref 94–250)

## 2011-11-12 MED ORDER — ACETAMINOPHEN 325 MG PO TABS
650.0000 mg | ORAL_TABLET | Freq: Once | ORAL | Status: AC
  Administered 2011-11-12: 650 mg via ORAL

## 2011-11-12 MED ORDER — DEXAMETHASONE SODIUM PHOSPHATE 10 MG/ML IJ SOLN
20.0000 mg | Freq: Once | INTRAMUSCULAR | Status: AC
  Administered 2011-11-12: 20 mg via INTRAVENOUS

## 2011-11-12 MED ORDER — SODIUM CHLORIDE 0.9 % IV SOLN
375.0000 mg/m2 | Freq: Once | INTRAVENOUS | Status: DC
  Filled 2011-11-12: qty 60

## 2011-11-12 MED ORDER — SODIUM CHLORIDE 0.9 % IV SOLN
Freq: Once | INTRAVENOUS | Status: AC
  Administered 2011-11-12: 12:00:00 via INTRAVENOUS

## 2011-11-12 MED ORDER — SODIUM CHLORIDE 0.9 % IV SOLN
375.0000 mg/m2 | Freq: Once | INTRAVENOUS | Status: AC
  Administered 2011-11-12: 600 mg via INTRAVENOUS
  Filled 2011-11-12: qty 60

## 2011-11-12 MED ORDER — FAMOTIDINE IN NACL 20-0.9 MG/50ML-% IV SOLN
20.0000 mg | Freq: Once | INTRAVENOUS | Status: AC
  Administered 2011-11-12: 20 mg via INTRAVENOUS

## 2011-11-12 MED ORDER — DIPHENHYDRAMINE HCL 25 MG PO CAPS
25.0000 mg | ORAL_CAPSULE | Freq: Once | ORAL | Status: AC
  Administered 2011-11-12: 25 mg via ORAL

## 2011-11-12 MED ORDER — ONDANSETRON 8 MG/50ML IVPB (CHCC)
8.0000 mg | Freq: Once | INTRAVENOUS | Status: AC
  Administered 2011-11-12: 8 mg via INTRAVENOUS

## 2011-11-12 NOTE — Progress Notes (Signed)
CC:   Massie Maroon, MD [name], MD Barbaraann Share, MD,FCCP Courtney Paris, M.D.  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  Mr. Monts has been receiving maintenance Rituxan following induction treatment with Rituxan and fludarabine from 05/31/2008 through 08/22/2008.  The patient's diagnosis was established by a core needle biopsy of a mesenteric mass on 04/21/2008.  The patient has stage IV disease with involvement of blood and bone marrow at the time of diagnosis.  He has been on maintenance Rituxan since 09/19/2008 and continues to do quite well.  He was last treated on 08/13/2011.  Mr. Gassert is now 77 years old.  He gets along quite well.  He is active, lives with his wife, independent.  He drives.  His only real complaints are chronic cough which he has now had for 2-1/2 years, felt to be in part due to reflux.  He has had a full workup for that problem.  He denies any abdominal pain, any trouble eating, fever, chills, night sweats.  His white count is a little elevated today but he tells me that he has not had any steroid injections or corticosteroid therapy.  He has had no problems with prior doses of his Rituxan most recently given 3 months ago.  PROBLEM LIST: 1. Small lymphocytic B-cell non-Hodgkin's lymphoma presenting with a     large abdominal mass in the mesentery with core needle biopsy on     04/21/2008 showing coexpression of CD 5 and CD 20, also positive     for CD 19, CD 21 and CD 22, negative for CD 10.  The patient had     involvement of the peripheral blood and bone marrow when he was     evaluated at [facility] in early     June.  The patient received initially Rituxan and fludarabine 4     cycles from 05/31/2008 through 08/22/2008 and then has been on     maintenance Rituxan every 3 months since 09/19/2008.   He has been     virtually asymptomatic from the standpoint of his lymphoma.  The     patient's last CT scan of abdomen and pelvis was on 04/15/2011.     His last chest x-ray was on 02/15/2011.  The patient has had an     excellent partial response.  There were some enlarged lymph nodes     seen in the central mesentery measuring up to about 29 mm.  In the     pelvis there were no enlarged lymph nodes. 2. Chronic cough possibly related to GERD. 3. Achalasia. 4. COPD. 5. History of kidney stones. 6. History of elevated PSA. 7. BPH. 8. Hearing impairment. 9. Sun related damage of skin. 10.Diverticulosis. 11.Multiple colonic polyps.  MEDICATIONS:  Are as follows:  Beta-carotene with minerals, vitamin B12, Proscar, Fish Oil fatty acids, Prevacid and glucosamine chondroitin sulfate.  PHYSICAL EXAMINATION:  Mr. Holberg looks generally well and unchanged.  He is vigorous and able to get up to the examining table without any assistance.  Weight is 141.4 pounds.  Height 5 feet 3-1/2 inches.  Body surface area 1.7 meters squared.  Blood pressure 108/68.  O2 saturation on room air at rest was 95%.  Other vital signs are normal.  There is male pattern balding.  No scleral icterus.  Mouth and pharynx benign. No  peripheral adenopathy palpable in the neck, axillary, supraclavicular or inguinal regions.  Lungs are clear to percussion and auscultation. The patient does have a nonproductive cough.  Cardiac regular rhythm without murmur or rub.  Abdomen, he has about a 6-7 cm hernia just above the umbilicus as well as hernia involving the umbilicus.  No definite abdominal masses, ascites, organ enlargement.  Extremities 3+ peripheral edema of both lower legs.  No central catheter or port.  He has a lot of sun-related skin damage.  Neurologic exam is grossly normal.  LABORATORY DATA:  Today, white count 14.5, ANC 11.0, hemoglobin 13.7, hematocrit 42.1 and platelets 198,000.  Differential is normal  with 76% neutrophils, 12% lymphocytes.  Absolute lymphocyte count was 1.7 which is normal.  The patient's white count is slightly elevated for reasons that are not entirely clear.  As stated, the patient is asymptomatic from his lymphoma.  He denies any fever, chills, night sweats, any sign of infection or any history of steroid injections or oral treatment. Chemistries today are pending.  Chemistries from 08/13/2011 notable for an albumin of 3.3, glucose of 133.  Liver function tests including LDH were normal.  In looking over the patient's chart I see where he did have hepatitis serology carried out on 10/18/2010.  This consisted of hepatitis B surface antibody, hepatitis B surface antigen, hepatitis B core antibody total, hepatitis core antibody IgM, all of which were negative.  IMPRESSION AND PLAN:  In general, Mr. Boyte is doing well.  We will go ahead with another dose of Rituxan with the dose rounded off to 600 mg IV.  We will continue this every 3 months.  The next treatment will be scheduled for March 5 at which time we will check CBC and chemistries. The patient is doing well from our standpoint.  He has minimal adenopathy as per his last CT scan of the abdomen and pelvis on 04/15/2011.  Will probably repeat the CT scans somewhere in the vicinity of May or June.  For now the patient is scheduled for another treatment with Rituxan maintenance on March 5.    ______________________________ Samul Dada, M.D. DSM/MEDQ  D:  11/12/2011  T:  11/12/2011  Job:  [id number]

## 2011-11-12 NOTE — Telephone Encounter (Signed)
gve the pt his march 2013 appt calendar °

## 2011-11-12 NOTE — Progress Notes (Signed)
This office note has been dictated.  #[id number]

## 2011-12-10 DIAGNOSIS — Z87442 Personal history of urinary calculi: Secondary | ICD-10-CM

## 2011-12-10 HISTORY — DX: Personal history of urinary calculi: Z87.442

## 2012-02-11 ENCOUNTER — Encounter: Payer: Self-pay | Admitting: Oncology

## 2012-02-11 ENCOUNTER — Other Ambulatory Visit: Payer: Medicare Other | Admitting: Lab

## 2012-02-11 ENCOUNTER — Ambulatory Visit (HOSPITAL_BASED_OUTPATIENT_CLINIC_OR_DEPARTMENT_OTHER): Payer: Medicare Other

## 2012-02-11 ENCOUNTER — Ambulatory Visit: Payer: Medicare Other | Admitting: Oncology

## 2012-02-11 ENCOUNTER — Other Ambulatory Visit (HOSPITAL_BASED_OUTPATIENT_CLINIC_OR_DEPARTMENT_OTHER): Payer: Medicare Other | Admitting: Lab

## 2012-02-11 VITALS — BP 113/60 | HR 71 | Temp 97.2°F

## 2012-02-11 VITALS — BP 119/65 | HR 88 | Temp 97.0°F | Ht 63.5 in | Wt 138.4 lb

## 2012-02-11 DIAGNOSIS — C8599 Non-Hodgkin lymphoma, unspecified, extranodal and solid organ sites: Secondary | ICD-10-CM

## 2012-02-11 DIAGNOSIS — C8300 Small cell B-cell lymphoma, unspecified site: Secondary | ICD-10-CM

## 2012-02-11 DIAGNOSIS — Z5112 Encounter for antineoplastic immunotherapy: Secondary | ICD-10-CM

## 2012-02-11 LAB — LACTATE DEHYDROGENASE: LDH: 178 U/L (ref 94–250)

## 2012-02-11 LAB — COMPREHENSIVE METABOLIC PANEL
ALT: 12 U/L (ref 0–53)
BUN: 21 mg/dL (ref 6–23)
CO2: 27 mEq/L (ref 19–32)
Calcium: 9.3 mg/dL (ref 8.4–10.5)
Chloride: 104 mEq/L (ref 96–112)
Creatinine, Ser: 1.06 mg/dL (ref 0.50–1.35)
Total Bilirubin: 0.3 mg/dL (ref 0.3–1.2)

## 2012-02-11 LAB — CBC WITH DIFFERENTIAL/PLATELET
Basophils Absolute: 0 10*3/uL (ref 0.0–0.1)
EOS%: 1.1 % (ref 0.0–7.0)
Eosinophils Absolute: 0.2 10*3/uL (ref 0.0–0.5)
LYMPH%: 10.4 % — ABNORMAL LOW (ref 14.0–49.0)
MCH: 28.8 pg (ref 27.2–33.4)
MCV: 87.4 fL (ref 79.3–98.0)
MONO%: 8.3 % (ref 0.0–14.0)
Platelets: 185 10*3/uL (ref 140–400)
RBC: 4.69 10*6/uL (ref 4.20–5.82)
RDW: 15.2 % — ABNORMAL HIGH (ref 11.0–14.6)
nRBC: 0 % (ref 0–0)

## 2012-02-11 MED ORDER — DIPHENHYDRAMINE HCL 25 MG PO CAPS
25.0000 mg | ORAL_CAPSULE | Freq: Once | ORAL | Status: AC
  Administered 2012-02-11: 25 mg via ORAL

## 2012-02-11 MED ORDER — SODIUM CHLORIDE 0.9 % IV SOLN
Freq: Once | INTRAVENOUS | Status: AC
  Administered 2012-02-11: 12:00:00 via INTRAVENOUS

## 2012-02-11 MED ORDER — FAMOTIDINE IN NACL 20-0.9 MG/50ML-% IV SOLN
20.0000 mg | Freq: Once | INTRAVENOUS | Status: AC
  Administered 2012-02-11: 20 mg via INTRAVENOUS

## 2012-02-11 MED ORDER — ONDANSETRON 8 MG/50ML IVPB (CHCC)
8.0000 mg | Freq: Once | INTRAVENOUS | Status: AC
  Administered 2012-02-11: 8 mg via INTRAVENOUS

## 2012-02-11 MED ORDER — DEXAMETHASONE SODIUM PHOSPHATE 10 MG/ML IJ SOLN
20.0000 mg | Freq: Once | INTRAMUSCULAR | Status: AC
  Administered 2012-02-11: 20 mg via INTRAVENOUS

## 2012-02-11 MED ORDER — ACETAMINOPHEN 325 MG PO TABS
650.0000 mg | ORAL_TABLET | Freq: Once | ORAL | Status: AC
  Administered 2012-02-11: 650 mg via ORAL

## 2012-02-11 MED ORDER — SODIUM CHLORIDE 0.9 % IV SOLN: 375.0000 mg/m2 | Freq: Once | INTRAVENOUS | Status: DC

## 2012-02-11 MED ORDER — SODIUM CHLORIDE 0.9 % IV SOLN
375.0000 mg/m2 | Freq: Once | INTRAVENOUS | Status: AC
  Administered 2012-02-11: 600 mg via INTRAVENOUS
  Filled 2012-02-11: qty 60

## 2012-02-11 NOTE — Progress Notes (Signed)
CC:   Massie Maroon, MD [name], MD Barbaraann Share, MD,FCCP Courtney Paris, M.D.   PROBLEM LIST:  1. Small lymphocytic B-cell non-Hodgkin's lymphoma presenting with a  large abdominal mass in the mesentery with core needle biopsy on  04/21/2008 showing coexpression of CD 5 and CD 20, also positive  for CD 19, CD 21 and CD 22, negative for CD 10. The patient had  involvement of the peripheral blood and bone marrow when he was  evaluated at [facility] in early  June. The patient received initially Rituxan and fludarabine 4  cycles from 05/31/2008 through 08/22/2008 and then has been on  maintenance Rituxan every 3 months since 09/19/2008. He has been  virtually asymptomatic from the standpoint of his lymphoma. The  patient's last CT scan of abdomen and pelvis was on 04/15/2011.  His last chest x-ray was on 02/15/2011. The patient has had an  excellent partial response. There were some enlarged lymph nodes  seen in the central mesentery measuring up to about 29 mm. In the  pelvis there were no enlarged lymph nodes.  It should be noted that Mr. Hazzard did undergo hepatitis screening on 10/18/2010.  This consisted of hepatitis B surface antibody, hepatitis B surface antigen, hepatitis B core antibody total, and hepatitis core antibody IgM; all of which were negative. 2. Chronic cough possibly related to GERD.  3. Achalasia.  4. COPD.  5. History of kidney stones.  6. History of elevated PSA.  7. BPH.  8. Hearing impairment.  9. Sun related damage of skin.  10.Diverticulosis.  11.Multiple colonic polyps.   MEDICATIONS: 1. Ocuvite 1 tablet daily. 2. Cyanocobalamin 100 mcg daily. 3. Proscar 5 mg daily. 4. Fish oil omega-3 fatty acids 1,000 mg daily. 5. Glucosamine chondroitin 500-400 mg daily. 6. Prevacid 30 mg daily.  HISTORY:  Daniel [name] is a 78 year old, white, married male followed by me for small lymphocytic B-cell  non-Hodgkin's lymphoma presenting with a large abdominal mass involving the mesentery with diagnosis going back to 04/21/2008.  The patient has stage IV disease with involvement of peripheral blood and bone marrow.  He is currently on maintenance Rituxan, which was started on 09/19/2008, following initial treatment with Rituxan and fludarabine from 05/31/2008 through 08/22/2008.  Mr. Snellings was last seen by [place] on 11/12/2011 at which time he received Rituxan 600 mg IV.  We are treating him with maintenance dosing every 3 months.  He tolerates these treatments well without any problems, although occasionally has some trouble sleeping at night due to the effects of the Decadron.  The patient continues to have a cough, which has been attributed to reflux.  He does have some sputum production from this.  A new problem, which has arisen about 6 months ago, seems to be episodic rigors occurring every 2-3 weeks, usually at around 6-7 p.m. and lasting about an hour.  These are not associated with any fever.  The patient does not attribute this to being cold since they usually occur when he is home.  He has never had this before.  There do not seem to be any after effects.  In general, the patient feels well.  He says that, if anything, his energy has improved, although he does not have as much energy as he used to.  He denies any pain and remains quite active, especially for his age.  PHYSICAL EXAMINATION:  General Appearance:  He continues to look well and is his  usual outgoing, friendly self.  Weight is 138.4 pounds. Height 5 feet and 3-1/2 inches.  Body surface area 1.7 m2.  Other vital signs are normal.  HEENT:  There is no scleral icterus.  Mouth and pharynx benign.  Lymph Nodes:  No peripheral adenopathy palpable in the neck, axillary, supraclavicular, or inguinal areas.  Lungs: Today reveal inspiratory crackles bilaterally despite coughing.  On his last visit here on December 4th, his  lungs were clear.  Cardiac Exam:  Regular rhythm without murmur or rub.  Abdomen:  Benign without masses, ascites, or organ enlargement.  The abdomen is notable for continued presence of a 6-7 cm hernia above the umbilicus as well as an umbilical hernia. Extremities:  He has 1-2+ edema of both lower legs, as previously noted. No clubbing.  The patient does not have a central catheter or port.  He has a lot of sun-related skin damage.  Neurologic Exam:  Nonfocal.  LABORATORY DATA TODAY:  White count 13.1, ANC 10.4, hemoglobin 13.5, hematocrit 41, platelets  185,000.  Absolute lymphocyte count was 1.4, which is normal.  There are 80% neutrophils, 10.4% lymphocytes. Chemistries today are pending.  Chemistries from 11/12/2011 are notable only for an albumin of 3.4, total protein of 5.3, and, thus, globulins are 1.9, which are low.  LDH was slightly elevated at 286 as compared with 149 on 08/13/2011.  IMAGING STUDIES: 1. CT scan of the abdomen and pelvis with IV contrast from 03/04/2010     showed central mesenteric adenopathy.  The largest left anterior     mesenteric lymph node measures 2.5 x 1.2 cm, felt to be stable in     size compared with the prior exam of 03/22/2009.  No new adenopathy     was noted.  There was marked enlargement of the prostate gland with     indentation of the urinary bladder base.  There is a diverticulum     in the right lateral aspect of the urinary bladder, felt to be     stable.  There was a stable compression fracture of T12 vertebral     body.  There was patchy airspace disease in the right lower lobe     posteriorly, felt to be due to either infection or inflammatory     process. 2. Chest x-ray, 2 view, from 02/15/2011 showed stable chronic     bibasilar interstitial disease compared with the chest x-ray of     03/04/2010.  No acute findings. 3. CT of the abdomen and pelvis with IV contrast from 04/15/2011     showed progression of peribronchial  interstitial thickening within     the left and right lower lobes.  There was mild interval increase     in adenopathy within the central small bowel mesentery.  There was     increased hydroureter and mild hydronephrosis on the right.  A     lymph node in the left abdomen measures 18 x 29 mm, similar to 15 x     27 mm on the prior study of 03/04/2010.  It was felt that there     might be more lymph nodes notable within the mesentery.  In the     pelvis, there were no enlarged lymph nodes.  Again, there was     marked enlargement of the prostate gland.  IMPRESSION AND PLAN:  Mr. Nee continues to do well.  We will continue with maintenance Rituxan every 3 months as we are doing, 600 mg  per dose.  Mr. Carsey is due for treatment today.  We will plan to see Mr. Wrinkle again in approximately 3 months.  We will obtain a chest x-ray, 2 view, and a CT scan of abdomen and pelvis with IV contrast several days prior to his scheduled appointment to see me, which will be around June 11th, at which time we will check CBC and chemistries and he will be due for another dose of Rituxan.  I am not sure what to make of the intermittent rigors.  Certainly, these could be related to his underlying lymphoma.  I cannot completely exclude the possibility of some intermittent seeding of bacteria, perhaps a virus, although I would not expect these to be quite as predictable as Mr. Thall has relayed to me, nor would I expect them to be quite as transient as he describes.   ______________________________ Samul Dada, M.D. DSM/MEDQ  D:  02/11/2012  T:  02/11/2012  Job:  [id number]

## 2012-02-11 NOTE — Progress Notes (Signed)
This office note has been dictated.  #[id number]

## 2012-02-11 NOTE — Patient Instructions (Signed)
Pt to go to lab for lab draw before going home. Call with any problems

## 2012-02-21 ENCOUNTER — Ambulatory Visit: Payer: Medicare Other

## 2012-02-21 ENCOUNTER — Ambulatory Visit: Payer: Medicare Other | Admitting: Oncology

## 2012-05-12 ENCOUNTER — Other Ambulatory Visit (HOSPITAL_BASED_OUTPATIENT_CLINIC_OR_DEPARTMENT_OTHER): Payer: Medicare Other | Admitting: Lab

## 2012-05-12 ENCOUNTER — Telehealth: Payer: Self-pay | Admitting: Medical Oncology

## 2012-05-12 ENCOUNTER — Ambulatory Visit (HOSPITAL_BASED_OUTPATIENT_CLINIC_OR_DEPARTMENT_OTHER): Payer: Medicare Other | Admitting: Lab

## 2012-05-12 ENCOUNTER — Other Ambulatory Visit: Payer: Self-pay | Admitting: Oncology

## 2012-05-12 ENCOUNTER — Ambulatory Visit (HOSPITAL_COMMUNITY)
Admission: RE | Admit: 2012-05-12 | Discharge: 2012-05-12 | Disposition: A | Discharge status: Home / Self Care | Payer: Medicare Other | Source: Ambulatory Visit | Attending: Oncology | Admitting: Oncology

## 2012-05-12 DIAGNOSIS — C8300 Small cell B-cell lymphoma, unspecified site: Secondary | ICD-10-CM

## 2012-05-12 DIAGNOSIS — N1330 Unspecified hydronephrosis: Secondary | ICD-10-CM

## 2012-05-12 DIAGNOSIS — Z9079 Acquired absence of other genital organ(s): Secondary | ICD-10-CM | POA: Insufficient documentation

## 2012-05-12 DIAGNOSIS — K5730 Diverticulosis of large intestine without perforation or abscess without bleeding: Secondary | ICD-10-CM | POA: Insufficient documentation

## 2012-05-12 DIAGNOSIS — Z9089 Acquired absence of other organs: Secondary | ICD-10-CM | POA: Insufficient documentation

## 2012-05-12 DIAGNOSIS — Z79899 Other long term (current) drug therapy: Secondary | ICD-10-CM | POA: Insufficient documentation

## 2012-05-12 DIAGNOSIS — R599 Enlarged lymph nodes, unspecified: Secondary | ICD-10-CM | POA: Insufficient documentation

## 2012-05-12 DIAGNOSIS — C8599 Non-Hodgkin lymphoma, unspecified, extranodal and solid organ sites: Secondary | ICD-10-CM

## 2012-05-12 DIAGNOSIS — C8589 Other specified types of non-Hodgkin lymphoma, extranodal and solid organ sites: Secondary | ICD-10-CM | POA: Insufficient documentation

## 2012-05-12 DIAGNOSIS — K4090 Unilateral inguinal hernia, without obstruction or gangrene, not specified as recurrent: Secondary | ICD-10-CM | POA: Insufficient documentation

## 2012-05-12 LAB — CMP (CANCER CENTER ONLY)
ALT(SGPT): 27 U/L (ref 10–47)
AST: 22 U/L (ref 11–38)
Albumin: 3 g/dL — ABNORMAL LOW (ref 3.3–5.5)
CO2: 30 mEq/L (ref 18–33)
Calcium: 8.7 mg/dL (ref 8.0–10.3)
Chloride: 100 mEq/L (ref 98–108)
Potassium: 4.5 mEq/L (ref 3.3–4.7)
Sodium: 142 mEq/L (ref 128–145)
Total Protein: 6.3 g/dL — ABNORMAL LOW (ref 6.4–8.1)

## 2012-05-12 LAB — CBC WITH DIFFERENTIAL/PLATELET
Eosinophils Absolute: 0.1 10*3/uL (ref 0.0–0.5)
MONO#: 1.3 10*3/uL — ABNORMAL HIGH (ref 0.1–0.9)
NEUT#: 8.8 10*3/uL — ABNORMAL HIGH (ref 1.5–6.5)
Platelets: 208 10*3/uL (ref 140–400)
RBC: 4.79 10*6/uL (ref 4.20–5.82)
RDW: 15.5 % — ABNORMAL HIGH (ref 11.0–14.6)
WBC: 11.4 10*3/uL — ABNORMAL HIGH (ref 4.0–10.3)
nRBC: 0 % (ref 0–0)

## 2012-05-12 NOTE — Telephone Encounter (Signed)
Victorino Dike from Dr. Lenoria Chime office called to let us know that  Dr. Retta Diones would like for [place] to get a BMET and fax results to him. He is going to see pt on Friday 05/15/12

## 2012-05-12 NOTE — Telephone Encounter (Signed)
I called pt per Dr. Arline Asp to inform him that he has bilateral hydroureteronephrosis. He has had one on the right but the left is new. There is a kidney stone on the left. Pt states he has kidney stones about 50 years ago. I noted he has seen Dr. Aldean Ast in the past but he states he now sees Dr. Retta Diones since Dr. Aldean Ast retired. I told him that Dr. Arline Asp would like for him to see Dr. Retta Diones and I will call his nurse to discuss. He voiced understanding. I called Dr. Lenoria Chime nurse Victorino Dike and I also faxed her the report. She will discuss with Dr. Retta Diones regarding an appointment. I let her know that Dr. Arline Asp would like to check a BMET and if they would like for [place] to order to please call me.

## 2012-05-12 NOTE — Telephone Encounter (Signed)
I called pt to let him know that Dr. Arline Asp and Dr. Retta Diones would like to check his kidney function. He states he can come right now. Appointment made.

## 2012-05-15 ENCOUNTER — Encounter: Payer: Self-pay | Admitting: Oncology

## 2012-05-15 ENCOUNTER — Encounter (HOSPITAL_COMMUNITY): Payer: Self-pay | Admitting: *Deleted

## 2012-05-15 ENCOUNTER — Other Ambulatory Visit: Payer: Self-pay | Admitting: Urology

## 2012-05-15 NOTE — Progress Notes (Signed)
Asked not to take any Advil,Ibuprofen,Aleve, or Toradol 72 hour prior to procedure, bring blue folder to procedure, sign all forms in the folder,eat a light dinner Sunday evening and take a laxative between 5 an 6 p..m. And NPO after MN 05-17-12. Understood all instructions given.

## 2012-05-15 NOTE — Progress Notes (Signed)
CT scans of the abdomen and pelvis without IV contrast from 05/12/2012 were reviewed with a radiologist. Compared with the CT scan of 04/15/2011 there has been some progression in a few of the lymph nodes that are being followed. One of the mesenteric lymph nodes which previously measured 2.4 x 1.3 cm now measures 4.5 x 2.4 cm.  The patient is scheduled to see me on 05/19/2012 and also is scheduled for Rituxan on that date. Options are to continue with the current schedule of Rituxan, shorten the interval between treatments or introduce bendamustine with Rituxan.

## 2012-05-18 ENCOUNTER — Ambulatory Visit (HOSPITAL_COMMUNITY): Payer: Medicare Other

## 2012-05-18 ENCOUNTER — Ambulatory Visit: Payer: Medicare Other | Admitting: Oncology

## 2012-05-18 ENCOUNTER — Ambulatory Visit (HOSPITAL_COMMUNITY)
Admission: RE | Admit: 2012-05-18 | Discharge: 2012-05-18 | Disposition: A | Discharge status: Home / Self Care | Payer: Medicare Other | Source: Ambulatory Visit | Attending: Urology | Admitting: Urology

## 2012-05-18 ENCOUNTER — Encounter (HOSPITAL_COMMUNITY): Payer: Self-pay | Admitting: *Deleted

## 2012-05-18 ENCOUNTER — Encounter (HOSPITAL_COMMUNITY)
Admission: RE | Disposition: A | Discharge status: Home / Self Care | Payer: Self-pay | Source: Ambulatory Visit | Attending: Urology

## 2012-05-18 DIAGNOSIS — N189 Chronic kidney disease, unspecified: Secondary | ICD-10-CM | POA: Insufficient documentation

## 2012-05-18 DIAGNOSIS — G4730 Sleep apnea, unspecified: Secondary | ICD-10-CM | POA: Insufficient documentation

## 2012-05-18 DIAGNOSIS — N1330 Unspecified hydronephrosis: Secondary | ICD-10-CM | POA: Insufficient documentation

## 2012-05-18 DIAGNOSIS — N400 Enlarged prostate without lower urinary tract symptoms: Secondary | ICD-10-CM | POA: Insufficient documentation

## 2012-05-18 DIAGNOSIS — N201 Calculus of ureter: Secondary | ICD-10-CM | POA: Insufficient documentation

## 2012-05-18 DIAGNOSIS — N323 Diverticulum of bladder: Secondary | ICD-10-CM | POA: Insufficient documentation

## 2012-05-18 DIAGNOSIS — C8589 Other specified types of non-Hodgkin lymphoma, extranodal and solid organ sites: Secondary | ICD-10-CM | POA: Insufficient documentation

## 2012-05-18 SURGERY — LITHOTRIPSY, ESWL
Anesthesia: LOCAL | Laterality: Left

## 2012-05-18 MED ORDER — HYDROCODONE-ACETAMINOPHEN 5-500 MG PO TABS: 1.0000 | ORAL_TABLET | Freq: Four times a day (QID) | ORAL | Status: DC | PRN

## 2012-05-18 MED ORDER — DIPHENHYDRAMINE HCL 25 MG PO CAPS
25.0000 mg | ORAL_CAPSULE | ORAL | Status: AC
  Administered 2012-05-18: 25 mg via ORAL

## 2012-05-18 MED ORDER — DIAZEPAM 5 MG PO TABS
ORAL_TABLET | ORAL | Status: AC
  Administered 2012-05-18: 10 mg via ORAL
  Filled 2012-05-18: qty 2

## 2012-05-18 MED ORDER — DEXTROSE-NACL 5-0.45 % IV SOLN
INTRAVENOUS | Status: DC
  Administered 2012-05-18: 08:00:00 via INTRAVENOUS

## 2012-05-18 MED ORDER — DIPHENHYDRAMINE HCL 25 MG PO CAPS
ORAL_CAPSULE | ORAL | Status: AC
  Administered 2012-05-18: 25 mg via ORAL
  Filled 2012-05-18: qty 1

## 2012-05-18 MED ORDER — DIAZEPAM 5 MG PO TABS
10.0000 mg | ORAL_TABLET | ORAL | Status: AC
  Administered 2012-05-18: 10 mg via ORAL

## 2012-05-18 NOTE — Progress Notes (Signed)
Patient took laxative yesterday. Denies aspirin or blood thinners past week.

## 2012-05-18 NOTE — Progress Notes (Signed)
Patient first stated he had 4 oz of water this am at 0500. RN called Lorayne Marek on [employer] truck. She stated to call Dr. Retta Diones. MD paged. Immediately after that patient called RN to room and said he drinks water in the am only when he takes his meds and he didn't have his meds this am. Therefore he stated he feels certain now that he did NOT have water this am. Lorayne Marek on the [employer] truck informed.

## 2012-05-18 NOTE — Discharge Instructions (Signed)
See [facility] discharge instructions in chart. MD has called in pain medicine to your pharmacy.     Lithotripsy Care After Refer to this sheet for the next few weeks. These discharge instructions provide you with general information on caring for yourself after you leave the hospital. Your caregiver may also give you specific instructions. Your treatment has been planned according to the most current medical practices available, but unavoidable complications sometimes occur. If you have any problems or questions after discharge, please call your caregiver. AFTER THE PROCEDURE   The recovery time will vary with the procedure done.   You will be taken to the recovery area. A nurse will watch and check your progress. Once you are awake, stable, and taking fluids well, you will be allowed to go home as long as there are no problems.   Your urine may have a red tinge for a few days after treatment. Blood loss is usually minimal.   You may have soreness in the back or flank area. This usually goes away after a few days. The procedure can cause blotches or bruises on the back where the pressure wave enters the skin. These marks usually cause only minimal discomfort and should disappear in a short time.   Stone fragments should begin to pass within 24 hours of treatment. However, a delayed passage is not unusual.   You may have pain, discomfort, and feel sick to your stomach (nauseous) when the crushed (pulverized) fragments of stone are passed down the tube from the kidney to the bladder. Stone fragments can pass soon after the procedure and may last for up to 4 to 8 weeks.   A small number of patients may have severe pain when stone fragments are not able to pass, which leads to an obstruction.   If your stone is greater than 1 inch/2.5 centimeters in diameter or if you have multiple stones that have a combined diameter greater than 1 inch/2.5 centimeters, you may require more than 1  treatment.   You must have someone drive you home.  HOME CARE INSTRUCTIONS   Rest at home until you feel your energy improving.   Only take over-the-counter or prescription medicines for pain, discomfort, or fever as directed by your caregiver. Depending on the type of lithotripsy, you may need to take medicines (antibiotics) that kill germs and anti-inflammatory medicines for a few days.   Drink enough water and fluids to keep your urine clear or pale yellow. This helps "flush" your kidneys. It helps pass any remaining pieces of stone and prevents stones from coming back.   Most people can resume daily activities within 1 or 2 days after standard lithotripsy. It can take longer to recover from laser and percutaneous lithotripsy.   If the stones are in your urinary system, you may be asked to strain your urine at home to look for stones. Any stones that are found can be sent to a medical lab for examination.   Visit your caregiver for a follow-up appointment in a few weeks. Your doctor may remove your stent if you have one. Your caregiver will also check to see whether stone particles still remain.  SEEK MEDICAL CARE IF:   You have an oral temperature above 102 F (38.9 C).   Your pain is not relieved by medicine.   You have a lasting nauseous feeling.   You feel there is too much blood in the urine.   You develop persistent problems with frequent and/or  painful urination that does not at least partially improve after 2 days following the procedure.   You have a congested cough.   You feel lightheaded.   You develop a rash or any other signs that might suggest an allergic problem.   You develop any reaction or side effects to your medicine(s).  SEEK IMMEDIATE MEDICAL CARE IF:   You experience severe back and/or flank pain.   You see nothing but blood when you urinate.   You cannot pass any urine at all.   You have an oral temperature above 102 F (38.9 C), not controlled  by medicine.   You develop shortness of breath, difficulty breathing, or chest pain.   You develop vomiting that will not stop after 6 to 8 hours.   You have a fainting episode.  MAKE SURE YOU:   Understand these instructions.   Will watch your condition.   Will get help right away if you are not doing well or get worse.  Document Released: 12/15/2007 Document Revised: 11/14/2011 Document Reviewed: 12/15/2007 [facility] Patient Information 2012 [place], [place].

## 2012-05-18 NOTE — H&P (Signed)
  Urology History and Physical Exam  CC: Left mid ureteral stone  HPI: 78 year old male with the following history:   He has been followed for years by Dr. Vic Blackbird. I recently started seeing him. He has a history of persistent/asymptomatic right hydroureteronephrosis, a large bladder diverticulum on the right without symptoms, and a huge prostate. He has a history of PSA elevation with a negative  biopsy in the past.  He is on finasteride. He has tolerable urinary symptoms. He denies any right flank pain, gross hematuria, dysuria, or any urologic issues since his visit here last year.    He had a recent CT scan, ordered by Dr. Arline Asp for followup of lymphoma. This revealed a 9 mm left distal ureteral stone with proximal hydronephrosis. In retrospect, the patient had left lower quadrant pain about a month ago. He has had intermittent back pain. He has no nausea, vomiting, fever or chills. Recent creatinine was 1.6. About 4 months ago, it was 1.1. He was seen recently in the office, and we reviewed treatment options. He desired to proceed with lithotripsy. He is aware of the procedure, as well  As risks and complications, including but not limited to pain, bleeding, need for a 2nd procedure or ureteroscopy. He desires to proceed.  PMH: Past Medical History  Diagnosis Date  . Chronic kidney disease   . NHL (non-Hodgkin's lymphoma)     nhl dx 5/09;    PSH: Past Surgical History  Procedure Date  . Cholecystectomy   . Prostate surgery   . Eye surgery     Implants bilateral    Allergies: No Known Allergies  Medications: No prescriptions prior to admission     Social History: History   Social History  . Marital Status: Married    Spouse Name: N/A    Number of Children: N/A  . Years of Education: N/A   Occupational History  . Not on file.   Social History Main Topics  . Smoking status: Former Smoker    Quit date: 05/09/1972  . Smokeless tobacco: Not on file  .  Alcohol Use: 0.6 oz/week    1 Glasses of wine per week  . Drug Use:   . Sexually Active:    Other Topics Concern  . Not on file   Social History Narrative  . No narrative on file    Family History: No family history on file.  Review of Systems: Positive: Left flank/abdominal pain, slow stream. Negative:  A further 10 point review of systems was negative except what is listed in the HPI.  Physical Exam: @VITALS2 @ General: No acute distress.  Awake. Head:  Normocephalic.  Atraumatic. Hard of hearing. ENT:  EOMI.  Mucous membranes moist Neck:  Supple.  No lymphadenopathy. CV:  S1 present. S2 present. Regular rate. Pulmonary: Equal effort bilaterally.  Clear to auscultation bilaterally. Abdomen: Soft.  Non tender to palpation. Skin:  Normal turgor.  No visible rash. Extremity: No gross deformity of bilateral upper extremities.  No gross deformity of    bilateral lower extremities. Neurologic: Alert. Appropriate mood.   Studies:  No results found for this basename: HGB:2,WBC:2,PLT:2 in the last 72 hours  No results found for this basename: NA:2,K:2,CL:2,CO2:2,BUN:2,CREATININE:2,CALCIUM:2,MAGNESIUM:2,GFRNONAA:2,GFRAA:2 in the last 72 hours   No results found for this basename: PT:2,INR:2,APTT:2 in the last 72 hours   No components found with this basename: ABG:2    Assessment:  Left ureteral stone  Plan: Left ESL

## 2012-05-19 ENCOUNTER — Ambulatory Visit (HOSPITAL_BASED_OUTPATIENT_CLINIC_OR_DEPARTMENT_OTHER): Payer: Medicare Other

## 2012-05-19 ENCOUNTER — Encounter: Payer: Self-pay | Admitting: Oncology

## 2012-05-19 ENCOUNTER — Telehealth: Payer: Self-pay | Admitting: Oncology

## 2012-05-19 ENCOUNTER — Telehealth: Payer: Self-pay | Admitting: *Deleted

## 2012-05-19 ENCOUNTER — Ambulatory Visit (HOSPITAL_BASED_OUTPATIENT_CLINIC_OR_DEPARTMENT_OTHER): Payer: Medicare Other | Admitting: Oncology

## 2012-05-19 VITALS — BP 108/50 | HR 68 | Temp 95.0°F

## 2012-05-19 VITALS — BP 125/65 | HR 90 | Temp 97.0°F | Ht 65.0 in | Wt 131.0 lb

## 2012-05-19 DIAGNOSIS — C8599 Non-Hodgkin lymphoma, unspecified, extranodal and solid organ sites: Secondary | ICD-10-CM

## 2012-05-19 DIAGNOSIS — C8300 Small cell B-cell lymphoma, unspecified site: Secondary | ICD-10-CM

## 2012-05-19 DIAGNOSIS — Z5112 Encounter for antineoplastic immunotherapy: Secondary | ICD-10-CM

## 2012-05-19 DIAGNOSIS — R634 Abnormal weight loss: Secondary | ICD-10-CM

## 2012-05-19 MED ORDER — FAMOTIDINE IN NACL 20-0.9 MG/50ML-% IV SOLN
20.0000 mg | Freq: Once | INTRAVENOUS | Status: AC
  Administered 2012-05-19: 20 mg via INTRAVENOUS

## 2012-05-19 MED ORDER — DEXAMETHASONE SODIUM PHOSPHATE 10 MG/ML IJ SOLN
20.0000 mg | Freq: Once | INTRAMUSCULAR | Status: AC
  Administered 2012-05-19: 20 mg via INTRAVENOUS

## 2012-05-19 MED ORDER — ACETAMINOPHEN 325 MG PO TABS
650.0000 mg | ORAL_TABLET | Freq: Once | ORAL | Status: AC
  Administered 2012-05-19: 650 mg via ORAL

## 2012-05-19 MED ORDER — SODIUM CHLORIDE 0.9 % IV SOLN
Freq: Once | INTRAVENOUS | Status: AC
  Administered 2012-05-19: 11:00:00 via INTRAVENOUS

## 2012-05-19 MED ORDER — SODIUM CHLORIDE 0.9 % IV SOLN
375.0000 mg/m2 | Freq: Once | INTRAVENOUS | Status: AC
  Administered 2012-05-19: 600 mg via INTRAVENOUS
  Filled 2012-05-19: qty 60

## 2012-05-19 MED ORDER — DIPHENHYDRAMINE HCL 25 MG PO CAPS
25.0000 mg | ORAL_CAPSULE | Freq: Once | ORAL | Status: AC
  Administered 2012-05-19: 25 mg via ORAL

## 2012-05-19 MED ORDER — ONDANSETRON 8 MG/50ML IVPB (CHCC)
8.0000 mg | Freq: Once | INTRAVENOUS | Status: AC
  Administered 2012-05-19: 8 mg via INTRAVENOUS

## 2012-05-19 NOTE — Telephone Encounter (Signed)
Per POF I have scheduled treatment appt for the patient. Not enough time after MD visit on 7/9 to do treatment. Treatment moved to 7/10. Rose notified.  JMW

## 2012-05-19 NOTE — Telephone Encounter (Signed)
Gave pt appt for July 9th lab and MD, chemo the next day, notify MD's nurse Lynden Ang

## 2012-05-19 NOTE — Progress Notes (Signed)
CC:   Daniel Maroon, MD Daniel Gosling, MD Daniel Share, MD,FCCP Daniel [name], M.D.  PROBLEM LIST:   1. Small lymphocytic B-cell non-Hodgkin lymphoma presenting with a     large abdominal mass in the mesentery.  Core needle biopsy on     04/21/2008 showed coexpression of CD5 and CD20, also CD19, CD21,     and CD22.  CD10 was negative.  The patient had involvement of the     peripheral blood and bone marrow when he was evaluated at [facility] in early June 2009.  The     patient initially was treated with Rituxan and fludarabine, 4     cycles from 05/31/2008 through 08/22/2008.  Since then he has been     on maintenance Rituxan every 3 months and has remained asymptomatic     from the standpoint of his lymphoma.  The patient had an excellent     response to these treatments.  His most recent CT scan of the     abdomen and pelvis carried out without IV contrast on 05/12/2012     was compared with the CT scan of the abdomen and pelvis carried out     on 04/15/2011.  There has been some mild progression of mesenteric     adenopathy.  For that reason, we will be treating the patient with     Rituxan on a monthly basis for the next few months. It should be noted that Daniel [name] underwent hepatitis screening on 10/18/2010.  This consisted of hepatitis B surface antibody, hepatitis B surface antigen, hepatitis B core antibody total, and hepatitis core antibody IgM.  All of these were negative.  2. Chronic cough possibly related to GERD.  3. Achalasia.  4. COPD.  5. History of kidney stones.  6. History of elevated PSA.  7. BPH.  8. Hearing impairment.  9. Sun related damage of skin.  10.Diverticulosis.  11.Multiple colonic polyps. 12.Macular degeneration, under the care of Dr. Mateo [name]. 13.Lithotripsy carried out on an obstructing distal left ureteral     stone on 05/18/2012 by Dr. Marcine [name].    MEDICATIONS:  1. Ocuvite 1  tablet daily.  2. Prevacid 30 mg daily. 3. Proscar 5 mg daily.  4. Fish oil omega-3 fatty acids 1,000 mg daily.  5. Glucosamine chondroitin 500-400 mg daily.     HISTORY: I saw Daniel [name] today for followup of his small lymphocytic B-cell non-Hodgkin lymphoma, presenting with a large abdominal mass involving the mesentery.  His diagnosis goes back over 4 years now to Apr 21, 2008.  The patient had stage IV disease with peripheral blood and bone marrow involvement.  The patient is 78 years old.  Daniel [name] was last seen by [place] on 02/11/2012.  He had been receiving maintenance Rituxan every 3 months.  Daniel [name] underwent a scheduled CT scan of abdomen and pelvis without IV contrast on 05/12/2012.  This was compared with the prior study of 04/15/2011.  The major finding was the development of moderate left-sided hydronephrosis secondary to a distal left ureteral stone.  The patient had previous right-sided hydronephrosis, presumably secondary to prostatic enlargement and bladder outlet obstruction.  This had remained stable.  In addition, he was noted to have some mild progression of his mesenteric adenopathy.  The patient has been asymptomatic from these findings.  He was referred to Dr. Marcine [name], who carried out lithotripsy on the left-sided ureteral  stone yesterday.  The patient tolerated this well and is here today for scheduled dose of Rituxan.  As stated, he was last treated with Rituxan 3 months ago on 02/11/2012.  [name].  He denies any nausea, vomiting, pain.  He was having rigors every few weeks when I saw him 3 months ago.  Those rigors have ceased.  The patient is eating well, but nevertheless has lost about 7 pounds since his last visit.  We talked about nutritional supplements today.  Energy is decreased, but without change.  The patient is without fevers.  In general feels okay, remains independent and active.  PHYSICAL  EXAMINATION:  General:  He is a somewhat frail elderly gentleman, now 78 years old.  He needs a little assistance getting up and down from the examining table.  Vital Signs:  Weight today is 131 pounds, as compared with 138.4 pounds on 02/11/2012.  Height 5 feet 5 inches, body surface area 1.65 sq m.  Blood pressure 125/65.  Other vital signs are normal.  O2 saturation on room air at rest was 97%. HEENT:  There is no scleral icterus.  Mouth and pharynx are benign.  He has a soft swelling over his central forehead.  This has been stable, measures approximately 4 cm across x 3 cm in height.  This is asymptomatic, appears to be benign.  Lymph:  There is no peripheral adenopathy palpable in the neck, supraclavicular, axillary, or inguinal areas.  Lungs:  Notable for inspiratory rales about halfway up bilaterally.  These do not clear with cough.  I should mention, the patient's cough is about the same, nonproductive.  This has been attributed to reflux.  Cardiac:  Regular rhythm without murmur or rub. Abdomen:  Benign with no organomegaly or masses palpable.  He does have an umbilical hernia located above the umbilicus without significant change, measuring approximately 5 or 6 cm across.  This is easily reducible.  Extremities:  Trace to 1+ edema of the ankles without clubbing.  The patient has extensive sun-related skin damage.  No central catheter or port.  Neurologic Exam:  Nonfocal.  He walks with a limp.  LABORATORY DATA:  From June 4th, white count 11.4, ANC 8.8, hemoglobin 14.1, hematocrit 42.2, platelets 208,000.  Chemistries from 05/12/2012 notable for a BUN of 24, creatinine 1.6, glucose 122, albumin of 3.0. LDH 167.  Recent albumins have been slightly low.  Specifically on 03/05 albumin was 2.9, on 11/12/2011 albumin was 3.4, and on 08/13/2011 albumin was 3.3.  IMAGING STUDIES:  1. CT scan of the abdomen and pelvis with IV contrast from 03/04/2010  showed central mesenteric  adenopathy. The largest left anterior  mesenteric lymph node measures 2.5 x 1.2 cm, felt to be stable in  size compared with the prior exam of 03/22/2009. No new adenopathy  was noted. There was marked enlargement of the prostate gland with  indentation of the urinary bladder base. There is a diverticulum  in the right lateral aspect of the urinary bladder, felt to be  stable. There was a stable compression fracture of T12 vertebral  body. There was patchy airspace disease in the right lower lobe  posteriorly, felt to be due to either infection or inflammatory  process.   2. Chest x-ray, 2 view, from 02/15/2011 showed stable chronic  bibasilar interstitial disease compared with the chest x-ray of  03/04/2010. No acute findings.   3. CT of the abdomen and pelvis with IV contrast from  04/15/2011  showed progression of peribronchial interstitial thickening within  the left and right lower lobes. There was mild interval increase  in adenopathy within the central small bowel mesentery. There was  increased hydroureter and mild hydronephrosis on the right. A  lymph node in the left abdomen measures 18 x 29 mm, similar to 15 x  27 mm on the prior study of 03/04/2010. It was felt that there  might be more lymph nodes notable within the mesentery. In the  pelvis, there were no enlarged lymph nodes. Again, there was  marked enlargement of the prostate gland.  4. CT scan of abdomen and pelvis without IV contrast on 05/12/2012 showed progression of mesenteric adenopathy.  Specifically an index node now measures 4.5 x 2.4 cm on image 24 versus 2.4 x 1.3 cm on the prior CT scan of 04/15/2011.  There were scattered colonic diverticula.  There was no retrocrural or retroperitoneal adenopathy.  There was moderate to marked enlargement of the prostate with some mild bladder wall thickening and a right-sided bladder diverticulum.  There was fat- containing ventral abdominal wall hernia.  There was  development of moderate left-sided hydroureteronephrosis secondary to a distal left ureteric stone.  There was similar right-sided hydroureteronephrosis, felt to be due to prostatic enlargement and bladder outlet obstruction. There was improved appearance at the lung bases.  T12 compression deformity was unchanged and there was evidence of prior cholecystectomy. There was a tiny hiatal hernia.  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  Heart size was normal without pericardial or pleural Effusion.   IMPRESSION AND PLAN:  In general, Daniel [name] is doing well.  I am concerned about his weight loss today, approximately 7 pounds from his last visit.  Perhaps some of this may be due to less edema in his legs. We talked about nutritional supplements.  In general, Daniel [name] is feeling well and eating well and has no specific complaints.  He is moderately active and independent for his age of 81.  I have reviewed the CT scan of the abdomen and pelvis and compared it with the prior study from a year ago.  There has been some slight progression despite Rituxan given every 3 months as maintenance therapy. I thought the simplest thing we could do is perhaps decrease the interval between Rituxan treatments to monthly.  The patient is due for Rituxan 600 mg IV today.  We will have him return on or about July 9th, at which time we will check CBC and chemistries and plan to treat him with Rituxan 600 mg IV at that time and monthly thereafter.  We will probably get another CT scan of the abdomen and pelvis in November or December for comparison.  If the patient has significant progression of his disease, then we may consider treating him with bendamustine.  The patient was given a copy of his imaging studies.    ______________________________ Samul Dada, M.D. DSM/MEDQ  D:  05/19/2012  T:  05/19/2012  Job:  [id number]

## 2012-05-19 NOTE — Patient Instructions (Addendum)
[facility] Discharge Instructions for Patients Receiving Chemotherapy  Today you received the following chemotherapy agents Rituxan  To help prevent nausea and vomiting after your treatment, we encourage you to take your nausea medication as needed.   If you develop nausea and vomiting that is not controlled by your nausea medication, call the clinic. If it is after clinic hours your family physician or the after hours number for the clinic or go to the Emergency Department.   BELOW ARE SYMPTOMS THAT SHOULD BE REPORTED IMMEDIATELY:  *FEVER GREATER THAN 100.5 F  *CHILLS WITH OR WITHOUT FEVER  NAUSEA AND VOMITING THAT IS NOT CONTROLLED WITH YOUR NAUSEA MEDICATION  *UNUSUAL SHORTNESS OF BREATH  *UNUSUAL BRUISING OR BLEEDING  TENDERNESS IN MOUTH AND THROAT WITH OR WITHOUT PRESENCE OF ULCERS  *URINARY PROBLEMS  *BOWEL PROBLEMS  UNUSUAL RASH Items with * indicate a potential emergency and should be followed up as soon as possible.  One of the nurses will contact you 24 hours after your treatment. Please let the nurse know about any problems that you may have experienced. Feel free to call the clinic you have any questions or concerns. The clinic phone number is [phone].   I have been informed and understand all the instructions given to me. I know to contact the clinic, my physician, or go to the Emergency Department if any problems should occur. I do not have any questions at this time, but understand that I may call the clinic during office hours   should I have any questions or need assistance in obtaining follow up care.    __________________________________________  _____________  __________ Signature of Patient or Authorized Representative            Date                   Time    __________________________________________ Nurse's Signature

## 2012-05-19 NOTE — Progress Notes (Signed)
This office note has been dictated.  #[id number]

## 2012-06-09 ENCOUNTER — Other Ambulatory Visit: Payer: Self-pay | Admitting: Oncology

## 2012-06-10 ENCOUNTER — Encounter: Payer: Self-pay | Admitting: Dietician

## 2012-06-10 NOTE — Progress Notes (Signed)
Brief Out-patient Oncology Nutrition Note  Reason: Patient screened positive for nutrition risk for unintentional weight loss and decreased appetite.   Mr. Bulson is a 78 year old patient of Dr. Arline Asp, diagnosed with lymphoma. Contacted patient via telephone number for positive nutrition risk. Spoke with patient's wife. She reported patient is eating ok. She stated she tries to make him drink Boost nutrition supplement. She reported she makes sure he eats so that he does not loose any more weight.   Wt Readings from Last 10 Encounters:  05/19/12 131 lb (59.421 kg)  05/18/12 140 lb (63.504 kg)  05/18/12 140 lb (63.504 kg)  02/11/12 138 lb 6.4 oz (62.778 kg)  11/12/11 141 lb (63.957 kg)  11/12/11 141 lb 6.4 oz (64.139 kg)  11/10/11 140 lb 3.2 oz (63.594 kg)  11/30/10 141 lb (63.957 kg)  11/21/09 148 lb (67.132 kg)  10/03/09 145 lb (65.772 kg)   I have educated patient's wife on high-calorie, high-protein foods. I encouraged her to have patient try carnation instant breakfast if patient will not drink Boost. She would like patient to receive nutrition information via mail. Provided RD contact information.   RD available for nutrition needs.   Iven Finn [facility] [phone]

## 2012-06-16 ENCOUNTER — Ambulatory Visit (HOSPITAL_BASED_OUTPATIENT_CLINIC_OR_DEPARTMENT_OTHER): Payer: Medicare Other | Admitting: Oncology

## 2012-06-16 ENCOUNTER — Telehealth: Payer: Self-pay | Admitting: Oncology

## 2012-06-16 ENCOUNTER — Encounter: Payer: Self-pay | Admitting: Oncology

## 2012-06-16 ENCOUNTER — Other Ambulatory Visit (HOSPITAL_BASED_OUTPATIENT_CLINIC_OR_DEPARTMENT_OTHER): Payer: Medicare Other

## 2012-06-16 VITALS — BP 116/67 | HR 74 | Temp 97.1°F | Ht 65.5 in | Wt 130.5 lb

## 2012-06-16 DIAGNOSIS — D126 Benign neoplasm of colon, unspecified: Secondary | ICD-10-CM

## 2012-06-16 DIAGNOSIS — R05 Cough: Secondary | ICD-10-CM

## 2012-06-16 DIAGNOSIS — C8599 Non-Hodgkin lymphoma, unspecified, extranodal and solid organ sites: Secondary | ICD-10-CM

## 2012-06-16 DIAGNOSIS — C8300 Small cell B-cell lymphoma, unspecified site: Secondary | ICD-10-CM

## 2012-06-16 DIAGNOSIS — J449 Chronic obstructive pulmonary disease, unspecified: Secondary | ICD-10-CM

## 2012-06-16 LAB — COMPREHENSIVE METABOLIC PANEL
ALT: 23 U/L (ref 0–53)
Albumin: 3.3 g/dL — ABNORMAL LOW (ref 3.5–5.2)
Alkaline Phosphatase: 101 U/L (ref 39–117)
CO2: 28 mEq/L (ref 19–32)
Potassium: 4 mEq/L (ref 3.5–5.3)
Sodium: 140 mEq/L (ref 135–145)
Total Bilirubin: 0.6 mg/dL (ref 0.3–1.2)
Total Protein: 5 g/dL — ABNORMAL LOW (ref 6.0–8.3)

## 2012-06-16 LAB — CBC WITH DIFFERENTIAL/PLATELET
BASO%: 0.8 % (ref 0.0–2.0)
Eosinophils Absolute: 0.1 10*3/uL (ref 0.0–0.5)
LYMPH%: 15.8 % (ref 14.0–49.0)
MCHC: 32.6 g/dL (ref 32.0–36.0)
MONO#: 0.8 10*3/uL (ref 0.1–0.9)
MONO%: 10.6 % (ref 0.0–14.0)
NEUT#: 5.1 10*3/uL (ref 1.5–6.5)
Platelets: 203 10*3/uL (ref 140–400)
RBC: 4.59 10*6/uL (ref 4.20–5.82)
RDW: 16 % — ABNORMAL HIGH (ref 11.0–14.6)
WBC: 7.1 10*3/uL (ref 4.0–10.3)

## 2012-06-16 NOTE — Progress Notes (Signed)
CC:   Massie Maroon, MD [name], MD Barbaraann Share, MD,FCCP Bertram Millard. Dahlstedt, M.D.   PROBLEM LIST:  1. 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 Core needle biopsy on  04/21/2008 showed coexpression of CD5 and CD20, also CD19, CD21,  and CD22. CD10 was negative. The patient had involvement of the  peripheral blood and bone marrow when he was evaluated at [facility] in early June 2009. The  patient initially was treated with Rituxan and fludarabine, 4  cycles from 05/31/2008 through 08/22/2008. Since then he has been  on maintenance Rituxan every 3 months and has remained asymptomatic  from the standpoint of his lymphoma. The patient had an excellent  response to these treatments. His most recent CT scan of the  abdomen and pelvis carried out without IV contrast on 05/12/2012  was compared with the CT scan of the abdomen and pelvis carried out  on 04/15/2011. There has been some mild progression of mesenteric  adenopathy. For that reason, we will be treating the patient with  Rituxan on a monthly basis for the next few months.  It should be noted that Mr. Pietrzyk underwent hepatitis screening on  10/18/2010. This consisted of hepatitis B surface antibody, hepatitis B  surface antigen, hepatitis B core antibody total, and hepatitis core  antibody IgM. All of these were negative.  2. Chronic cough possibly related to GERD.  3. Achalasia.  4. COPD.  5. History of kidney stones.  6. History of elevated PSA.  7. BPH.  8. Hearing impairment.  9. Sun related damage of skin.  10.Diverticulosis.  11.Multiple colonic polyps.  12.Macular degeneration, under the care of Dr. Mateo Flow.  13.Lithotripsy carried out on an obstructing distal left ureteral  stone on 05/18/2012 by Dr. Marcine Matar.    MEDICATIONS:  1. Ocuvite 1 tablet daily.  2. Prevacid 30 mg daily.  3.  Proscar 5 mg daily.  4. Fish oil omega-3 fatty acids 1,000 mg daily.  5. Glucosamine chondroitin 500-400 mg daily.  6. Flomax 0.4 mg daily.   SMOKING HISTORY:  Patient has been a nonsmoker except for a pipe in the past.   HISTORY:  Yaxiel Minnie was seen today for followup of his small lymphocytic B-cell non-Hodgkin's lymphoma presenting with a large abdominal mass involving the mesentery back in May 2009.  The patient had stage IV disease with peripheral blood and bone marrow involvement. He is 78 years old.  Mr. Deloria was last seen by [place] on 05/19/2012 at which time he received Rituxan 600 mg IV.  We have been treating him monthly since a CT scan of the abdomen and pelvis on 05/12/2012 showed some progression as compared with a prior CT scan from 04/15/2011.  Mr.. Stepter is here for evaluation prior to another dose of Rituxan which he will be receiving tomorrow.  The only change in Mr. Davids condition has been some discomfort in his left sacral iliac area.  This hurts with movement, started recently, but seems to be improving.  He is using a cane, however.  He denies any change in his respiratory status.  He continues to have swelling of his ankles.  The patient otherwise feels fine, is without any complaints. He had no problems with his treatment a month ago.  PHYSICAL EXAMINATION:  The patient is elderly, somewhat frail.  He needs some assistance getting up to the examining table.  Weight is  138.5 pounds, height 5 feet 5-1/2 inches, body surface area 1.65 sq m.  Blood pressure 116/67.  Other vital signs are normal.  O2 saturation on room air at rest was 98%.  There was no scleral icterus.  Mouth and pharynx are benign.  No peripheral adenopathy palpable.  Lungs:  Notable for inspiratory and expiratory rhonchi with prolonged expiratory phase. These abnormalities are present bilaterally.  Despite his lung exam, the patient denies any respiratory distress.  He still has a  nonproductive cough.  Cardiac:  Regular rhythm without murmur or rub.  Abdomen:  Soft, nontender, with no organomegaly or masses palpable.  He has an umbilical hernia located above the umbilicus without change.  This is easily reducible.  He has 1 to 2+ edema of both ankles.  He has lots of sun- related skin damage over his arms.  No central catheter or port. Neurologic:  He walks with a limp and has a cane in his right hand.  No focal findings.  LABORATORY DATA:  Today, white count 7.1, ANC 5.1, hemoglobin 13.0, hematocrit 40.0, platelets 203,000.  Chemistries are pending. Chemistries from 05/12/2012 were notable for a glucose of 122, creatinine 1.6, BUN 24.  Albumin was 3.0.  LDH 167.   IMAGING STUDIES:  1. CT scan of the abdomen and pelvis with IV contrast from 03/04/2010  showed central mesenteric adenopathy. The largest left anterior  mesenteric lymph node measures 2.5 x 1.2 cm, felt to be stable in  size compared with the prior exam of 03/22/2009. No new adenopathy  was noted. There was marked enlargement of the prostate gland with  indentation of the urinary bladder base. There is a diverticulum  in the right lateral aspect of the urinary bladder, felt to be  stable. There was a stable compression fracture of T12 vertebral  body. There was patchy airspace disease in the right lower lobe  posteriorly, felt to be due to either infection or inflammatory  process.  2. Chest x-ray, 2 view, from 02/15/2011 showed stable chronic  bibasilar interstitial disease compared with the chest x-ray of  03/04/2010. No acute findings.  3. CT of the abdomen and pelvis with IV contrast from 04/15/2011  showed progression of peribronchial interstitial thickening within  the left and right lower lobes. There was mild interval increase  in adenopathy within the central small bowel mesentery. There was  increased hydroureter and mild hydronephrosis on the right. A  lymph node in the left abdomen  measures 18 x 29 mm, similar to 15 x  27 mm on the prior study of 03/04/2010. It was felt that there  might be more lymph nodes notable within the mesentery. In the  pelvis, there were no enlarged lymph nodes. Again, there was  marked enlargement of the prostate gland.  4. CT scan of abdomen and pelvis without IV contrast on 05/12/2012 showed  progression of mesenteric adenopathy. Specifically an index node now  measures 4.5 x 2.4 cm on image 24 versus 2.4 x 1.3 cm on the prior CT  scan of 04/15/2011. There were scattered colonic diverticula. There  was no retrocrural or retroperitoneal adenopathy. There was moderate to  marked enlargement of the prostate with some mild bladder wall  thickening and a right-sided bladder diverticulum. There was fat-  containing ventral abdominal wall hernia. There was development of  moderate left-sided hydroureteronephrosis secondary to a distal left  ureteric stone. There was similar right-sided hydroureteronephrosis,  felt to be due to prostatic enlargement and bladder outlet  obstruction.  There was improved appearance at the lung bases. T12 compression  deformity was unchanged and there was evidence of prior cholecystectomy.  There was a tiny hiatal hernia. 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 Heart size was normal without pericardial or pleural  effusion.   IMPRESSION AND PLAN:  Mr. Lamadrid will be due for another dose of Rituxan 600 mg IV on Wednesday which is tomorrow, July 10th.  We talked about nutritional supplements today.  Mr. Payeur had taken about 1 can of Ensure per day.  I encouraged him to take 2 or 3.  His weight may have plateaued at this point.  However, a few years ago he was weighing about 150 pounds and, thus, he has lost about 20 pounds over the past 3 years.  We will plan to see Mr. Cislo again in about 1 month around August 8th at which time we will check CBC and chemistries.  He will be due for another dose  of Rituxan 600 mg IV.  We are continuing this on a monthly basis for now with plans to check CT scan of abdomen and pelvis in November or December for comparison.  If the present program is not effective in controlling his disease then we may have to consider treating him with bendamustine possibly or ofatumumab.    ______________________________ Samul Dada, M.D. DSM/MEDQ  D:  06/16/2012  T:  06/16/2012  Job:  [id number]

## 2012-06-16 NOTE — Progress Notes (Signed)
This office note has been dictated.  #[id number]

## 2012-06-16 NOTE — Telephone Encounter (Signed)
Nothing avail for dr dm,pt aware that i will call,note to [facility]

## 2012-06-17 ENCOUNTER — Other Ambulatory Visit: Payer: Self-pay | Admitting: Nurse Practitioner

## 2012-06-17 ENCOUNTER — Ambulatory Visit (HOSPITAL_BASED_OUTPATIENT_CLINIC_OR_DEPARTMENT_OTHER): Payer: Medicare Other

## 2012-06-17 VITALS — BP 134/54 | HR 58 | Temp 97.1°F

## 2012-06-17 DIAGNOSIS — C8300 Small cell B-cell lymphoma, unspecified site: Secondary | ICD-10-CM

## 2012-06-17 DIAGNOSIS — C8599 Non-Hodgkin lymphoma, unspecified, extranodal and solid organ sites: Secondary | ICD-10-CM

## 2012-06-17 DIAGNOSIS — Z5112 Encounter for antineoplastic immunotherapy: Secondary | ICD-10-CM

## 2012-06-17 MED ORDER — DEXAMETHASONE SODIUM PHOSPHATE 10 MG/ML IJ SOLN
20.0000 mg | Freq: Once | INTRAMUSCULAR | Status: AC
  Administered 2012-06-17: 20 mg via INTRAVENOUS

## 2012-06-17 MED ORDER — DIPHENHYDRAMINE HCL 25 MG PO CAPS
25.0000 mg | ORAL_CAPSULE | Freq: Once | ORAL | Status: AC
  Administered 2012-06-17: 25 mg via ORAL

## 2012-06-17 MED ORDER — FAMOTIDINE IN NACL 20-0.9 MG/50ML-% IV SOLN
20.0000 mg | Freq: Once | INTRAVENOUS | Status: AC
  Administered 2012-06-17: 20 mg via INTRAVENOUS

## 2012-06-17 MED ORDER — SODIUM CHLORIDE 0.9 % IV SOLN
Freq: Once | INTRAVENOUS | Status: AC
  Administered 2012-06-17: 09:00:00 via INTRAVENOUS

## 2012-06-17 MED ORDER — RITUXIMAB CHEMO INJECTION 10 MG/ML
375.0000 mg/m2 | Freq: Once | INTRAVENOUS | Status: AC
  Administered 2012-06-17: 600 mg via INTRAVENOUS
  Filled 2012-06-17: qty 60

## 2012-06-17 MED ORDER — ONDANSETRON 8 MG/50ML IVPB (CHCC)
8.0000 mg | Freq: Once | INTRAVENOUS | Status: AC
  Administered 2012-06-17: 8 mg via INTRAVENOUS

## 2012-06-17 MED ORDER — ACETAMINOPHEN 325 MG PO TABS
650.0000 mg | ORAL_TABLET | Freq: Once | ORAL | Status: AC
  Administered 2012-06-17: 650 mg via ORAL

## 2012-06-17 NOTE — Patient Instructions (Addendum)
[facility] Discharge Instructions for Patients Receiving Chemotherapy  Today you received the following chemotherapy agents Rituxin  To help prevent nausea and vomiting after your treatment, we encourage you to take your nausea medication Begin taking it at 7 pm and take it as often as prescribed for the next 24 to 72 hours.   If you develop nausea and vomiting that is not controlled by your nausea medication, call the clinic. If it is after clinic hours your family physician or the after hours number for the clinic or go to the Emergency Department.   BELOW ARE SYMPTOMS THAT SHOULD BE REPORTED IMMEDIATELY:  *FEVER GREATER THAN 100.5 F  *CHILLS WITH OR WITHOUT FEVER  NAUSEA AND VOMITING THAT IS NOT CONTROLLED WITH YOUR NAUSEA MEDICATION  *UNUSUAL SHORTNESS OF BREATH  *UNUSUAL BRUISING OR BLEEDING  TENDERNESS IN MOUTH AND THROAT WITH OR WITHOUT PRESENCE OF ULCERS  *URINARY PROBLEMS  *BOWEL PROBLEMS  UNUSUAL RASH Items with * indicate a potential emergency and should be followed up as soon as possible.  One of the nurses will contact you 24 hours after your treatment. Please let the nurse know about any problems that you may have experienced. Feel free to call the clinic you have any questions or concerns. The clinic phone number is [phone].   I have been informed and understand all the instructions given to me. I know to contact the clinic, my physician, or go to the Emergency Department if any problems should occur. I do not have any questions at this time, but understand that I may call the clinic during office hours   should I have any questions or need assistance in obtaining follow up care.    __________________________________________  _____________  __________ Signature of Patient or Authorized Representative            Date                   Time    __________________________________________ Nurse's Signature    

## 2012-06-19 ENCOUNTER — Telehealth: Payer: Self-pay | Admitting: *Deleted

## 2012-06-19 NOTE — Telephone Encounter (Signed)
Per staff message I have scheduled appt. JMW 

## 2012-07-06 ENCOUNTER — Other Ambulatory Visit: Payer: Self-pay | Admitting: Oncology

## 2012-07-06 ENCOUNTER — Other Ambulatory Visit: Payer: Self-pay | Admitting: Urology

## 2012-07-17 ENCOUNTER — Ambulatory Visit (HOSPITAL_BASED_OUTPATIENT_CLINIC_OR_DEPARTMENT_OTHER): Payer: Medicare Other | Admitting: Oncology

## 2012-07-17 ENCOUNTER — Encounter: Payer: Self-pay | Admitting: Oncology

## 2012-07-17 ENCOUNTER — Ambulatory Visit (HOSPITAL_BASED_OUTPATIENT_CLINIC_OR_DEPARTMENT_OTHER): Payer: Medicare Other

## 2012-07-17 ENCOUNTER — Other Ambulatory Visit (HOSPITAL_BASED_OUTPATIENT_CLINIC_OR_DEPARTMENT_OTHER): Payer: Medicare Other | Admitting: Lab

## 2012-07-17 ENCOUNTER — Other Ambulatory Visit: Payer: Medicare Other | Admitting: Lab

## 2012-07-17 VITALS — BP 100/57 | HR 80 | Temp 97.9°F | Resp 20 | Ht 65.5 in | Wt 133.8 lb

## 2012-07-17 VITALS — BP 98/59 | HR 72 | Temp 97.4°F | Resp 18

## 2012-07-17 DIAGNOSIS — C8583 Other specified types of non-Hodgkin lymphoma, intra-abdominal lymph nodes: Secondary | ICD-10-CM

## 2012-07-17 DIAGNOSIS — C8300 Small cell B-cell lymphoma, unspecified site: Secondary | ICD-10-CM

## 2012-07-17 DIAGNOSIS — C8599 Non-Hodgkin lymphoma, unspecified, extranodal and solid organ sites: Secondary | ICD-10-CM

## 2012-07-17 DIAGNOSIS — Z5112 Encounter for antineoplastic immunotherapy: Secondary | ICD-10-CM

## 2012-07-17 LAB — CBC WITH DIFFERENTIAL/PLATELET
BASO%: 0.2 % (ref 0.0–2.0)
Basophils Absolute: 0 10*3/uL (ref 0.0–0.1)
EOS%: 1.2 % (ref 0.0–7.0)
HGB: 12.5 g/dL — ABNORMAL LOW (ref 13.0–17.1)
MCH: 28.8 pg (ref 27.2–33.4)
RDW: 16.4 % — ABNORMAL HIGH (ref 11.0–14.6)
WBC: 13.7 10*3/uL — ABNORMAL HIGH (ref 4.0–10.3)
lymph#: 1.4 10*3/uL (ref 0.9–3.3)

## 2012-07-17 LAB — COMPREHENSIVE METABOLIC PANEL
ALT: 17 U/L (ref 0–53)
AST: 18 U/L (ref 0–37)
Albumin: 3.4 g/dL — ABNORMAL LOW (ref 3.5–5.2)
BUN: 21 mg/dL (ref 6–23)
Calcium: 9.1 mg/dL (ref 8.4–10.5)
Chloride: 103 mEq/L (ref 96–112)
Potassium: 4.2 mEq/L (ref 3.5–5.3)

## 2012-07-17 MED ORDER — SODIUM CHLORIDE 0.9 % IV SOLN
375.0000 mg/m2 | Freq: Once | INTRAVENOUS | Status: AC
  Administered 2012-07-17: 600 mg via INTRAVENOUS
  Filled 2012-07-17: qty 60

## 2012-07-17 MED ORDER — DEXAMETHASONE SODIUM PHOSPHATE 10 MG/ML IJ SOLN
20.0000 mg | Freq: Once | INTRAMUSCULAR | Status: AC
  Administered 2012-07-17: 20 mg via INTRAVENOUS

## 2012-07-17 MED ORDER — ONDANSETRON 8 MG/50ML IVPB (CHCC)
8.0000 mg | Freq: Once | INTRAVENOUS | Status: AC
  Administered 2012-07-17: 8 mg via INTRAVENOUS

## 2012-07-17 MED ORDER — FAMOTIDINE IN NACL 20-0.9 MG/50ML-% IV SOLN
20.0000 mg | Freq: Once | INTRAVENOUS | Status: AC
  Administered 2012-07-17: 20 mg via INTRAVENOUS

## 2012-07-17 MED ORDER — DIPHENHYDRAMINE HCL 25 MG PO CAPS
25.0000 mg | ORAL_CAPSULE | Freq: Once | ORAL | Status: AC
  Administered 2012-07-17: 25 mg via ORAL

## 2012-07-17 MED ORDER — ACETAMINOPHEN 325 MG PO TABS
650.0000 mg | ORAL_TABLET | Freq: Once | ORAL | Status: AC
  Administered 2012-07-17: 650 mg via ORAL

## 2012-07-17 NOTE — Progress Notes (Signed)
CC:   Massie Maroon, MD [name], MD Barbaraann Share, MD,FCCP Bertram Millard. Dahlstedt, M.D.  PROBLEM LIST:  1. 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 Core needle biopsy on  04/21/2008 showed coexpression of CD5 and CD20, also CD19, CD21,  and CD22. CD10 was negative. The patient had involvement of the  peripheral blood and bone marrow when he was evaluated at [facility] in early June 2009. The  patient initially was treated with Rituxan and fludarabine, 4  cycles from 05/31/2008 through 08/22/2008. Since then he has been  on maintenance Rituxan every 3 months and has remained asymptomatic  from the standpoint of his lymphoma. The patient had an excellent  response to these treatments. His most recent CT scan of the  abdomen and pelvis carried out without IV contrast on 05/12/2012  was compared with the CT scan of the abdomen and pelvis carried out  on 04/15/2011. There has been some mild progression of mesenteric  adenopathy. For that reason, we will be treating the patient with  Rituxan on a monthly basis for the next few months.  It should be noted that Daniel [name] underwent hepatitis screening on  10/18/2010. This consisted of hepatitis B surface antibody, hepatitis B  surface antigen, hepatitis B core antibody total, and hepatitis core  antibody IgM. All of these were negative.  2. Chronic cough possibly related to GERD.  3. Achalasia.  4. COPD.  5. History of kidney stones.  6. History of elevated PSA.  7. BPH.  8. Hearing impairment.  9. Sun related damage of skin.  10.Diverticulosis.  11.Multiple colonic polyps.  12.Macular degeneration, under the care of Dr. Mateo Flow.  13.Lithotripsy carried out on an obstructing distal left ureteral  stone on 05/18/2012 by Dr. Marcine Matar.    MEDICATIONS:  1. Ocuvite 1 tablet daily.  2. Prevacid 30 mg daily.  3. Proscar  5 mg daily.  4. Fish oil omega-3 fatty acids 1,000 mg daily.  5. Glucosamine chondroitin 500-400 mg daily.  6. Flomax 0.4 mg daily.  CURRENT CHEMOTHERAPY PROGRAM:  Rituxan 375 mg/m2 equal to 600 mg IV every month.    SMOKING HISTORY: Patient has been a nonsmoker except for a pipe in the past.    HISTORY:  I saw Daniel [name] today for followup of his small lymphocytic B-cell non-Hodgkin's lymphoma presenting with a large abdominal mass involving the mesentery in May 2009.  The patient had stage IV disease with peripheral blood and bone marrow involvement.  He is  78 years old and will be 17 on October 13.  Daniel [name] was last seen by [place] on 06/16/2012 and received Rituxan the following day, 06/17/2012.  With the exception of feeling tired and having no energy for a few days he tolerated the treatment well.  The patient's condition basically is the same.  He still has a troublesome unproductive cough.  He has increased his nutritional supplements to 3 cans of Ensure per day with some weight gain.  I believe his wife is about to have some surgery and his son is recovering from a nephrectomy.  There has been no change in the patient's condition and he is without complaints today.  PHYSICAL EXAM:  There is little change.  Weight is 133.8 pounds.  Height 5 feet 5-1/2 inches.  Body surface area 1.67 m2.  Blood pressure 100/57. Other vital signs are normal.  O2 saturation on  room air at rest was 94%.  He continues to have a cystic lesion on his forehead measuring about 4 cm across x 3 cm in height.  There is no scleral icterus.  Mouth and pharynx are benign.  There is no peripheral adenopathy palpable. Lungs notable for some inspiratory rales and squeaks over the right lower lung field.  Breath sounds seem to be decreased at the left base but the left lung seems to be clear.  Cardiac exam regular rhythm without murmur or rub.  Abdomen soft, benign.  He has an umbilical hernia located above  the umbilicus measuring about 8 cm across x 5 cm in height.  This is easily reducible.  No abdominal masses, tenderness, organomegaly.  No Port-A-Cath or central catheter.  Neurologic exam is normal.  The patient is without his cane today but does walk with a limp.  LABORATORY DATA:  Today, white count 13.7, ANC 11.0, hemoglobin 12.5, hematocrit 38.0, platelets 172,000.  Chemistries today are pending. Chemistries from 06/16/2012 notable for an albumin of 3.3, total protein 5.0 and globulins at 1.7.  Glucose 119, BUN 19, creatinine 1.13.  IMAGING STUDIES:  1. CT scan of the abdomen and pelvis with IV contrast from 03/04/2010  showed central mesenteric adenopathy. The largest left anterior  mesenteric lymph node measures 2.5 x 1.2 cm, felt to be stable in  size compared with the prior exam of 03/22/2009. No new adenopathy  was noted. There was marked enlargement of the prostate gland with  indentation of the urinary bladder base. There is a diverticulum  in the right lateral aspect of the urinary bladder, felt to be  stable. There was a stable compression fracture of T12 vertebral  body. There was patchy airspace disease in the right lower lobe  posteriorly, felt to be due to either infection or inflammatory  process.  2. Chest x-ray, 2 view, from 02/15/2011 showed stable chronic  bibasilar interstitial disease compared with the chest x-ray of  03/04/2010. No acute findings.  3. CT of the abdomen and pelvis with IV contrast from 04/15/2011  showed progression of peribronchial interstitial thickening within  the left and right lower lobes. There was mild interval increase  in adenopathy within the central small bowel mesentery. There was  increased hydroureter and mild hydronephrosis on the right. A  lymph node in the left abdomen measures 18 x 29 mm, similar to 15 x  27 mm on the prior study of 03/04/2010. It was felt that there  might be more lymph nodes notable within the mesentery.  In the  pelvis, there were no enlarged lymph nodes. Again, there was  marked enlargement of the prostate gland.  4. CT scan of abdomen and pelvis without IV contrast on 05/12/2012 showed  progression of mesenteric adenopathy. Specifically an index node now  measures 4.5 x 2.4 cm on image 24 versus 2.4 x 1.3 cm on the prior CT  scan of 04/15/2011. There were scattered colonic diverticula. There  was no retrocrural or retroperitoneal adenopathy. There was moderate to  marked enlargement of the prostate with some mild bladder wall  thickening and a right-sided bladder diverticulum. There was fat-  containing ventral abdominal wall hernia. There was development of  moderate left-sided hydroureteronephrosis secondary to a distal left  ureteric stone. There was similar right-sided hydroureteronephrosis,  felt to be due to prostatic enlargement and bladder outlet obstruction.  There was improved appearance at the lung bases. T12 compression  deformity was unchanged and there was evidence  of prior cholecystectomy.  There was a tiny hiatal hernia. 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 Heart size was normal without pericardial or pleural  effusion.   IMPRESSION AND PLAN:  Daniel [name] continues to do well with current program of Rituxan 600 mg IV every month.  He is due for treatment today.  He is being treated because of progression evident on the CT scan of the abdomen and pelvis from 05/12/2012 when compared with the CT scans of 04/15/2011.  We will have Daniel [name] return in about 5 weeks, about September 12 at which time he will have CBC and chemistries.  He will be due for another dose of Rituxan at that time.  It is our plan to recheck CT scan of abdomen and pelvis in November or December to see if he is responding.  If he is not responding then we may need to consider treating him with bendamustine and/or ofatumumab.    ______________________________ Samul Dada, M.D. DSM/MEDQ  D:  07/17/2012  T:  07/17/2012  Job:  [id number]

## 2012-07-17 NOTE — Progress Notes (Signed)
This office note has been dictated.  #[id number]

## 2012-07-20 ENCOUNTER — Telehealth: Payer: Self-pay | Admitting: Oncology

## 2012-07-20 NOTE — Telephone Encounter (Signed)
s/w pt and he is aware of his 9/12 appt   aom

## 2012-07-23 ENCOUNTER — Encounter (HOSPITAL_COMMUNITY): Payer: Self-pay | Admitting: *Deleted

## 2012-07-23 NOTE — Progress Notes (Signed)
1032 Asked to bring in the blue folder for the procedure,insurance card,ID,have a driver for the day,wear comfortable clothing. Asked not to take Aspirin,Advil or Toradol 72 hours prior to the procedure. Instructed to eat a light dinner and take his laxative Dulcolax as told by the doctor's office also take a fleet enema according to the patient. To arrive for the procedure at 0645 said he was clear on all of his instructions.

## 2012-07-27 NOTE — Progress Notes (Deleted)
07-23-12 1032 Asked to bring the blue folder.insurance card, ID, have a driver for the day and wear comfortable clothing. Asked not to take Aspirin ,Advil, Toradol 72 hours procedure to procedure and to review the medication list in the folder from the doctor. Instructed to eat a light dinner and take a laxative 08-02-12 by 6 pm unless other instructions were given by the doctors's office. To arrive for the procedure at 0645.

## 2012-07-28 ENCOUNTER — Encounter (HOSPITAL_COMMUNITY): Payer: Self-pay | Admitting: Pharmacy Technician

## 2012-07-29 NOTE — Progress Notes (Signed)
1125 07-29-12 Late entry for 07-23-12 1033 Asked to stop taking vitamins and herbal medications 7 days before procedure.

## 2012-07-29 NOTE — Progress Notes (Deleted)
07-23-12 1033 Asked to stop Vitamins and herbal medications 7 days before the procedure.

## 2012-08-03 ENCOUNTER — Ambulatory Visit (HOSPITAL_COMMUNITY): Payer: Medicare Other

## 2012-08-03 ENCOUNTER — Other Ambulatory Visit: Payer: Medicare Other | Admitting: Lab

## 2012-08-03 ENCOUNTER — Ambulatory Visit (HOSPITAL_COMMUNITY)
Admission: RE | Admit: 2012-08-03 | Discharge: 2012-08-03 | Disposition: A | Discharge status: Home / Self Care | Payer: Medicare Other | Source: Ambulatory Visit | Attending: Urology | Admitting: Urology

## 2012-08-03 ENCOUNTER — Encounter (HOSPITAL_COMMUNITY): Payer: Self-pay

## 2012-08-03 ENCOUNTER — Ambulatory Visit: Payer: Medicare Other | Admitting: Oncology

## 2012-08-03 ENCOUNTER — Encounter (HOSPITAL_COMMUNITY)
Admission: RE | Disposition: A | Discharge status: Home / Self Care | Payer: Self-pay | Source: Ambulatory Visit | Attending: Urology

## 2012-08-03 DIAGNOSIS — N201 Calculus of ureter: Secondary | ICD-10-CM | POA: Insufficient documentation

## 2012-08-03 DIAGNOSIS — C8589 Other specified types of non-Hodgkin lymphoma, extranodal and solid organ sites: Secondary | ICD-10-CM | POA: Insufficient documentation

## 2012-08-03 DIAGNOSIS — N1330 Unspecified hydronephrosis: Secondary | ICD-10-CM

## 2012-08-03 SURGERY — LITHOTRIPSY, ESWL
Anesthesia: LOCAL | Laterality: Left

## 2012-08-03 MED ORDER — SODIUM CHLORIDE 0.9 % IJ SOLN: 3.0000 mL | Freq: Two times a day (BID) | INTRAMUSCULAR | Status: DC

## 2012-08-03 MED ORDER — DIAZEPAM 5 MG PO TABS
10.0000 mg | ORAL_TABLET | ORAL | Status: AC
  Administered 2012-08-03: 10 mg via ORAL
  Filled 2012-08-03: qty 2

## 2012-08-03 MED ORDER — OXYCODONE HCL 5 MG PO TABS: 5.0000 mg | ORAL_TABLET | ORAL | Status: DC | PRN

## 2012-08-03 MED ORDER — OXYCODONE-ACETAMINOPHEN 5-325 MG PO TABS: ORAL_TABLET | ORAL | Status: DC

## 2012-08-03 MED ORDER — SODIUM CHLORIDE 0.9 % IJ SOLN: 3.0000 mL | INTRAMUSCULAR | Status: DC | PRN

## 2012-08-03 MED ORDER — ACETAMINOPHEN 325 MG PO TABS: 650.0000 mg | ORAL_TABLET | ORAL | Status: DC | PRN

## 2012-08-03 MED ORDER — DEXTROSE-NACL 5-0.45 % IV SOLN
INTRAVENOUS | Status: DC
  Administered 2012-08-03: 08:00:00 via INTRAVENOUS

## 2012-08-03 MED ORDER — ONDANSETRON HCL 4 MG/2ML IJ SOLN: 4.0000 mg | Freq: Four times a day (QID) | INTRAMUSCULAR | Status: DC | PRN

## 2012-08-03 MED ORDER — DIPHENHYDRAMINE HCL 25 MG PO CAPS
25.0000 mg | ORAL_CAPSULE | ORAL | Status: AC
  Administered 2012-08-03: 25 mg via ORAL
  Filled 2012-08-03: qty 1

## 2012-08-03 MED ORDER — ACETAMINOPHEN 650 MG RE SUPP
650.0000 mg | RECTAL | Status: DC | PRN
  Filled 2012-08-03: qty 1

## 2012-08-03 MED ORDER — MORPHINE SULFATE 10 MG/ML IJ SOLN: 1.0000 mg | INTRAMUSCULAR | Status: DC | PRN

## 2012-08-03 MED ORDER — SODIUM CHLORIDE 0.9 % IV SOLN: 250.0000 mL | INTRAVENOUS | Status: DC | PRN

## 2012-08-03 NOTE — H&P (Signed)
  Urology History and Physical Exam  CC: Kidney stone  HPI: 78 year old male presents for repeat ESL of a remaining left ureteral stone fragment remaining from ESL of a 10 mm left distal ureteral stone performed on 05/18/2012.  PMH: Past Medical History  Diagnosis Date  . Chronic kidney disease   . NHL (non-Hodgkin's lymphoma)     nhl dx 5/09;    PSH: Past Surgical History  Procedure Date  . Cholecystectomy   . Prostate surgery   . Eye surgery     Implants bilateral    Allergies: No Known Allergies  Medications: Prescriptions prior to admission  Medication Sig Dispense Refill  . finasteride (PROSCAR) 5 MG tablet Take 5 mg by mouth daily with breakfast.       . fish oil-omega-3 fatty acids 1000 MG capsule Take 1 g by mouth daily with breakfast.       . glucosamine-chondroitin 500-400 MG tablet Take 1 tablet by mouth daily.        . Hypromellose (GENTEAL OP) Place 1 drop into both eyes as needed. Dry eyes      . lansoprazole (PREVACID) 30 MG capsule Take 30 mg by mouth daily.        . Multiple Vitamins-Minerals (ICAPS MV PO) Take 1 tablet by mouth daily.      . Tamsulosin HCl (FLOMAX) 0.4 MG CAPS Take 0.4 mg by mouth daily.         Social History: History   Social History  . Marital Status: Married    Spouse Name: N/A    Number of Children: N/A  . Years of Education: N/A   Occupational History  . Not on file.   Social History Main Topics  . Smoking status: Former Smoker    Quit date: 05/09/1972  . Smokeless tobacco: Not on file  . Alcohol Use: 0.6 oz/week    1 Glasses of wine per week  . Drug Use:   . Sexually Active:    Other Topics Concern  . Not on file   Social History Narrative  . No narrative on file    Family History: History reviewed. No pertinent family history.  Review of Systems: Positive:  Negative:   A further 10 point review of systems was negative except what is listed in the HPI.  Physical Exam: @VITALS2 @ General: No acute  distress.  Awake. Head:  Normocephalic.  Atraumatic. ENT:  EOMI.  Mucous membranes moist Neck:  Supple.  No lymphadenopathy. CV:  S1 present. S2 present. Regular rate. Pulmonary: Equal effort bilaterally.  Clear to auscultation bilaterally. Abdomen: Soft.  Non tender to palpation. Skin:  Normal turgor.  No visible rash. Extremity: No gross deformity of bilateral upper extremities.  No gross deformity of    bilateral lower extremities. Neurologic: Alert. Appropriate mood.    Studies:  No results found for this basename: HGB:2,WBC:2,PLT:2 in the last 72 hours  No results found for this basename: NA:2,K:2,CL:2,CO2:2,BUN:2,CREATININE:2,CALCIUM:2,MAGNESIUM:2,GFRNONAA:2,GFRAA:2 in the last 72 hours   No results found for this basename: PT:2,INR:2,APTT:2 in the last 72 hours   No components found with this basename: ABG:2    Assessment:  Left ureteral stone  Plan: Repeat ESL

## 2012-08-12 ENCOUNTER — Other Ambulatory Visit: Payer: Self-pay | Admitting: Oncology

## 2012-08-13 ENCOUNTER — Ambulatory Visit: Payer: Medicare Other | Admitting: Oncology

## 2012-08-13 ENCOUNTER — Encounter: Payer: Self-pay | Admitting: Oncology

## 2012-08-18 ENCOUNTER — Encounter: Payer: Self-pay | Admitting: Oncology

## 2012-08-20 ENCOUNTER — Encounter: Payer: Self-pay | Admitting: Oncology

## 2012-08-20 ENCOUNTER — Ambulatory Visit (HOSPITAL_BASED_OUTPATIENT_CLINIC_OR_DEPARTMENT_OTHER): Payer: Medicare Other

## 2012-08-20 ENCOUNTER — Other Ambulatory Visit (HOSPITAL_BASED_OUTPATIENT_CLINIC_OR_DEPARTMENT_OTHER): Payer: Medicare Other | Admitting: Lab

## 2012-08-20 ENCOUNTER — Ambulatory Visit (HOSPITAL_BASED_OUTPATIENT_CLINIC_OR_DEPARTMENT_OTHER): Payer: Medicare Other | Admitting: Oncology

## 2012-08-20 ENCOUNTER — Telehealth: Payer: Self-pay | Admitting: Oncology

## 2012-08-20 VITALS — BP 108/52 | HR 74 | Temp 97.6°F | Resp 18

## 2012-08-20 VITALS — BP 125/65 | HR 87 | Temp 97.4°F | Resp 18 | Ht 65.0 in | Wt 136.9 lb

## 2012-08-20 DIAGNOSIS — C8300 Small cell B-cell lymphoma, unspecified site: Secondary | ICD-10-CM

## 2012-08-20 DIAGNOSIS — R05 Cough: Secondary | ICD-10-CM

## 2012-08-20 DIAGNOSIS — C8583 Other specified types of non-Hodgkin lymphoma, intra-abdominal lymph nodes: Secondary | ICD-10-CM

## 2012-08-20 DIAGNOSIS — N201 Calculus of ureter: Secondary | ICD-10-CM

## 2012-08-20 DIAGNOSIS — Z5112 Encounter for antineoplastic immunotherapy: Secondary | ICD-10-CM

## 2012-08-20 DIAGNOSIS — J8410 Pulmonary fibrosis, unspecified: Secondary | ICD-10-CM

## 2012-08-20 LAB — CBC WITH DIFFERENTIAL/PLATELET
Basophils Absolute: 0 10*3/uL (ref 0.0–0.1)
EOS%: 1.9 % (ref 0.0–7.0)
HGB: 12.8 g/dL — ABNORMAL LOW (ref 13.0–17.1)
MCH: 29 pg (ref 27.2–33.4)
MONO#: 1.2 10*3/uL — ABNORMAL HIGH (ref 0.1–0.9)
NEUT#: 8 10*3/uL — ABNORMAL HIGH (ref 1.5–6.5)
RDW: 16.3 % — ABNORMAL HIGH (ref 11.0–14.6)
WBC: 10.9 10*3/uL — ABNORMAL HIGH (ref 4.0–10.3)
lymph#: 1.6 10*3/uL (ref 0.9–3.3)

## 2012-08-20 LAB — COMPREHENSIVE METABOLIC PANEL (CC13)
ALT: 15 U/L (ref 0–55)
AST: 14 U/L (ref 5–34)
Albumin: 2.6 g/dL — ABNORMAL LOW (ref 3.5–5.0)
BUN: 19 mg/dL (ref 7.0–26.0)
Calcium: 8.4 mg/dL (ref 8.4–10.4)
Chloride: 108 mEq/L — ABNORMAL HIGH (ref 98–107)
Potassium: 4.2 mEq/L (ref 3.5–5.1)

## 2012-08-20 MED ORDER — ACETAMINOPHEN 325 MG PO TABS
650.0000 mg | ORAL_TABLET | Freq: Once | ORAL | Status: AC
  Administered 2012-08-20: 650 mg via ORAL

## 2012-08-20 MED ORDER — SODIUM CHLORIDE 0.9 % IV SOLN: Freq: Once | INTRAVENOUS | Status: DC

## 2012-08-20 MED ORDER — DEXAMETHASONE SODIUM PHOSPHATE 10 MG/ML IJ SOLN
20.0000 mg | Freq: Once | INTRAMUSCULAR | Status: AC
  Administered 2012-08-20: 20 mg via INTRAVENOUS

## 2012-08-20 MED ORDER — SODIUM CHLORIDE 0.9 % IV SOLN
375.0000 mg/m2 | Freq: Once | INTRAVENOUS | Status: AC
  Administered 2012-08-20: 600 mg via INTRAVENOUS
  Filled 2012-08-20: qty 60

## 2012-08-20 MED ORDER — DIPHENHYDRAMINE HCL 25 MG PO CAPS
25.0000 mg | ORAL_CAPSULE | Freq: Once | ORAL | Status: AC
  Administered 2012-08-20: 25 mg via ORAL

## 2012-08-20 MED ORDER — ONDANSETRON 8 MG/50ML IVPB (CHCC)
8.0000 mg | Freq: Once | INTRAVENOUS | Status: AC
  Administered 2012-08-20: 8 mg via INTRAVENOUS

## 2012-08-20 MED ORDER — FAMOTIDINE IN NACL 20-0.9 MG/50ML-% IV SOLN
20.0000 mg | Freq: Once | INTRAVENOUS | Status: AC
  Administered 2012-08-20: 20 mg via INTRAVENOUS

## 2012-08-20 NOTE — Progress Notes (Signed)
CC:   [name], MD Barbaraann Share, MD,FCCP Bertram Millard. Dahlstedt, M.D. Massie Maroon, MD  PROBLEM LIST:  1. 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 Core needle biopsy on  04/21/2008 showed coexpression of CD5 and CD20, also CD19, CD21,  and CD22. CD10 was negative. The patient had involvement of the  peripheral blood and bone marrow when he was evaluated at [facility] in early June 2009. The  patient initially was treated with Rituxan and fludarabine, 4  cycles from 05/31/2008 through 08/22/2008. Since then he has been  on maintenance Rituxan every 3 months and has remained asymptomatic  from the standpoint of his lymphoma. The patient had an excellent  response to these treatments. His most recent CT scan of the  abdomen and pelvis carried out without IV contrast on 05/12/2012  was compared with the CT scan of the abdomen and pelvis carried out  on 04/15/2011. There has been some mild progression of mesenteric  adenopathy. For that reason, we will be treating the patient with  Rituxan on a monthly basis for the next few months.  It should be noted that Mr. Gangemi underwent hepatitis screening on  10/18/2010. This consisted of hepatitis B surface antibody, hepatitis B  surface antigen, hepatitis B core antibody total, and hepatitis core  antibody IgM. All of these were negative.  2. Chronic cough possibly related to GERD.  3. Achalasia.  4. COPD.  5. History of kidney stones.  6. History of elevated PSA.  7. BPH.  8. Hearing impairment.  9. Sun related damage of skin.  10.Diverticulosis.  11.Multiple colonic polyps.  12.Macular degeneration, under the care of Dr. Mateo Flow.  13.Lithotripsy carried out on an obstructing distal left ureteral  stone on 05/18/2012 by Dr. Marcine Matar.    MEDICATIONS:  1. Ocuvite 1 tablet daily.  2. Prevacid 30 mg daily.  3. Proscar  5 mg daily.  4. Fish oil omega-3 fatty acids 1,000 mg daily.  5. Glucosamine chondroitin 500-400 mg daily.  6. Flomax 0.4 mg daily.   CURRENT CHEMOTHERAPY PROGRAM: Rituxan 375 mg/m2 equal to 600 mg IV  every month.   SMOKING HISTORY: Patient has been a nonsmoker except for a pipe in the past.   HISTORY:  Jase Reep was seen today for followup of his small lymphocytic B-cell non-Hodgkin lymphoma presenting with a large abdominal mass involving the mesentery in May 2009.  The patient had stage IV disease with peripheral blood and bone marrow involvement.  He is 78 years old, will be 78 on October 13th. on October 13th.  The patient was last seen by [place] on 07/17/2012, at which time he received another dose of Rituxan 600 mg IV.  The only real change in the patient's condition is increased swelling of his legs, right greater than left, not associated with any pain.  The patient at one time had been on Lasix but apparently had stopped this.  I have urged him to consult with Dr. Selena Batten, his primary physician.  The patient denies any problems tolerating his Rituxan.  He continues to have a cough, mostly unproductive, which varies in intensity.  He denies any shortness of breath, any fever, chills, night sweats.  The patient apparently had lithotripsy carried out on 08/03/2012.  His wife has undergone surgery for a hip replacement about 3 weeks ago, is currently recovering in a rehab facility.  The patient is without any other problems  or complaints.  PHYSICAL EXAMINATION:  There is little change.  He is a frail, elderly gentleman who is independent.  Weight is 136.8 pounds, height 5 feet 5 inches, body surface area 1.69 sq m.  Blood pressure 125/65.  Other vital signs are normal.  His physical exam is unchanged from the exam previously recorded on 07/17/2012.  He continues to have inspiratory rales bilaterally, right greater than left.  He does have more edema today in his legs, 2+ to 3+ in the right leg  up to the knee and about 1+ to 2+ on the left.  No calf tenderness.  Negative Homans sign.  The patient walks with a limp.  Does not have his cane today.  No Port-A- Cath or central catheter.  Abdomen:  Without masses or organ enlargement, and there is no adenopathy.  LABORATORY DATA:  Today, white count 10.9, ANC 8.0, hemoglobin 12.8, hematocrit 39.7, platelets 176,000.  Chemistries today are pending. Chemistries from 07/17/2012 notable for an albumin of 3.4 and total protein of 5.5.  Globulins were therefore 2.1.  IMAGING STUDIES:  1. CT scan of the abdomen and pelvis with IV contrast from 03/04/2010  showed central mesenteric adenopathy. The largest left anterior  mesenteric lymph node measures 2.5 x 1.2 cm, felt to be stable in  size compared with the prior exam of 03/22/2009. No new adenopathy  was noted. There was marked enlargement of the prostate gland with  indentation of the urinary bladder base. There is a diverticulum  in the right lateral aspect of the urinary bladder, felt to be  stable. There was a stable compression fracture of T12 vertebral  body. There was patchy airspace disease in the right lower lobe  posteriorly, felt to be due to either infection or inflammatory  process.  2. Chest x-ray, 2 view, from 02/15/2011 showed stable chronic  bibasilar interstitial disease compared with the chest x-ray of  03/04/2010. No acute findings.  3. CT of the abdomen and pelvis with IV contrast from 04/15/2011  showed progression of peribronchial interstitial thickening within  the left and right lower lobes. There was mild interval increase  in adenopathy within the central small bowel mesentery. There was  increased hydroureter and mild hydronephrosis on the right. A  lymph node in the left abdomen measures 18 x 29 mm, similar to 15 x  27 mm on the prior study of 03/04/2010. It was felt that there  might be more lymph nodes notable within the mesentery. In the  pelvis, there  were no enlarged lymph nodes. Again, there was  marked enlargement of the prostate gland.  4. CT scan of abdomen and pelvis without IV contrast on 05/12/2012 showed  progression of mesenteric adenopathy. Specifically an index node now  measures 4.5 x 2.4 cm on image 24 versus 2.4 x 1.3 cm on the prior CT  scan of 04/15/2011. There were scattered colonic diverticula. There  was no retrocrural or retroperitoneal adenopathy. There was moderate to  marked enlargement of the prostate with some mild bladder wall  thickening and a right-sided bladder diverticulum. There was fat-  containing ventral abdominal wall hernia. There was development of  moderate left-sided hydroureteronephrosis secondary to a distal left  ureteric stone. There was similar right-sided hydroureteronephrosis,  felt to be due to prostatic enlargement and bladder outlet obstruction.  There was improved appearance at the lung bases. T12 compression  deformity was unchanged and there was evidence of prior cholecystectomy.  There was a tiny hiatal hernia.  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 Heart size was normal without pericardial or pleural  effusion. 5. One-view abdomen on 05/18/2012 showed a 1 cm stone in the distal     aspect of the left ureter, felt to be unchanged compared with the     abdominal CT scan of 05/12/2012. 6. One-view abdomen on 08/03/2012 showed possible distal left ureteral     stone measuring about 6 mm along the left lateral margin of the     lower sacrum.  There are numerous phleboliths seen in the anatomic     pelvis bilaterally, making evaluation for a distal left ureteral     stone difficult.   IMPRESSION AND PLAN:  Clinically Mr. Labonte is stable, seems to be doing reasonably well.  He will receive another dose of Rituxan 600 mg IV.  We are treating him approximately every month for now and plan to repeat his CT scans in November or December and compare them with the last  CT scan of abdomen and pelvis carried out on 05/12/2012.  Today I am concerned about increased swelling of his legs and have urged him to contact his primary physician, Dr. Selena Batten, regarding reinstitution of diuretic therapy.  We will plan to see Mr. Goostree again in 1 month, which will be around October 11th, at which time we will check CBC and chemistries.  The patient will be due for another dose of Rituxan at that time.    ______________________________ Samul Dada, M.D. DSM/MEDQ  D:  08/20/2012  T:  08/20/2012  Job:  [id number]

## 2012-08-20 NOTE — Progress Notes (Signed)
This office note has been dictated.  #[id number]

## 2012-08-20 NOTE — Telephone Encounter (Signed)
gve the pt his oct 2013 appt calendar °

## 2012-09-22 ENCOUNTER — Encounter: Payer: Self-pay | Admitting: Medical Oncology

## 2012-09-22 ENCOUNTER — Other Ambulatory Visit (HOSPITAL_BASED_OUTPATIENT_CLINIC_OR_DEPARTMENT_OTHER): Payer: Medicare Other | Admitting: Lab

## 2012-09-22 ENCOUNTER — Encounter: Payer: Self-pay | Admitting: Oncology

## 2012-09-22 ENCOUNTER — Ambulatory Visit (HOSPITAL_BASED_OUTPATIENT_CLINIC_OR_DEPARTMENT_OTHER): Payer: Medicare Other | Admitting: Oncology

## 2012-09-22 ENCOUNTER — Ambulatory Visit (HOSPITAL_BASED_OUTPATIENT_CLINIC_OR_DEPARTMENT_OTHER): Payer: Medicare Other

## 2012-09-22 ENCOUNTER — Other Ambulatory Visit: Payer: Self-pay | Admitting: Oncology

## 2012-09-22 VITALS — BP 125/69 | HR 80 | Temp 97.0°F | Resp 18 | Ht 65.0 in | Wt 132.7 lb

## 2012-09-22 VITALS — BP 106/63 | HR 65 | Temp 98.2°F | Resp 18

## 2012-09-22 DIAGNOSIS — C8599 Non-Hodgkin lymphoma, unspecified, extranodal and solid organ sites: Secondary | ICD-10-CM

## 2012-09-22 DIAGNOSIS — Z5111 Encounter for antineoplastic chemotherapy: Secondary | ICD-10-CM

## 2012-09-22 DIAGNOSIS — C8300 Small cell B-cell lymphoma, unspecified site: Secondary | ICD-10-CM

## 2012-09-22 LAB — CBC WITH DIFFERENTIAL/PLATELET
BASO%: 0.7 % (ref 0.0–2.0)
EOS%: 3.2 % (ref 0.0–7.0)
MCH: 29.2 pg (ref 27.2–33.4)
MCV: 88 fL (ref 79.3–98.0)
MONO%: 9.9 % (ref 0.0–14.0)
RBC: 4.68 10*6/uL (ref 4.20–5.82)
RDW: 15 % — ABNORMAL HIGH (ref 11.0–14.6)

## 2012-09-22 LAB — COMPREHENSIVE METABOLIC PANEL (CC13)
AST: 13 U/L (ref 5–34)
Albumin: 2.9 g/dL — ABNORMAL LOW (ref 3.5–5.0)
Alkaline Phosphatase: 122 U/L (ref 40–150)
Potassium: 4.1 mEq/L (ref 3.5–5.1)
Sodium: 140 mEq/L (ref 136–145)
Total Protein: 5.5 g/dL — ABNORMAL LOW (ref 6.4–8.3)

## 2012-09-22 MED ORDER — ONDANSETRON 8 MG/50ML IVPB (CHCC)
8.0000 mg | Freq: Once | INTRAVENOUS | Status: AC
  Administered 2012-09-22: 8 mg via INTRAVENOUS

## 2012-09-22 MED ORDER — RITUXIMAB CHEMO INJECTION 10 MG/ML
375.0000 mg/m2 | Freq: Once | INTRAVENOUS | Status: AC
  Administered 2012-09-22: 600 mg via INTRAVENOUS
  Filled 2012-09-22: qty 60

## 2012-09-22 MED ORDER — SODIUM CHLORIDE 0.9 % IV SOLN
Freq: Once | INTRAVENOUS | Status: AC
  Administered 2012-09-22: 13:00:00 via INTRAVENOUS

## 2012-09-22 MED ORDER — ACETAMINOPHEN 325 MG PO TABS
650.0000 mg | ORAL_TABLET | Freq: Once | ORAL | Status: AC
  Administered 2012-09-22: 650 mg via ORAL

## 2012-09-22 MED ORDER — DIPHENHYDRAMINE HCL 25 MG PO CAPS
25.0000 mg | ORAL_CAPSULE | Freq: Once | ORAL | Status: AC
  Administered 2012-09-22: 25 mg via ORAL

## 2012-09-22 MED ORDER — FAMOTIDINE IN NACL 20-0.9 MG/50ML-% IV SOLN
20.0000 mg | Freq: Once | INTRAVENOUS | Status: AC
  Administered 2012-09-22: 20 mg via INTRAVENOUS

## 2012-09-22 MED ORDER — DEXAMETHASONE SODIUM PHOSPHATE 10 MG/ML IJ SOLN
20.0000 mg | Freq: Once | INTRAMUSCULAR | Status: AC
  Administered 2012-09-22: 20 mg via INTRAVENOUS

## 2012-09-22 NOTE — Patient Instructions (Signed)
[facility] Discharge Instructions for Patients Receiving Chemotherapy  Today you received the following chemotherapy agents Rituxan   To help prevent nausea and vomiting after your treatment, we encourage you to take your nausea medication as prescribed If you develop nausea and vomiting that is not controlled by your nausea medication, call the clinic. If it is after clinic hours your family physician or the after hours number for the clinic or go to the Emergency Department.   BELOW ARE SYMPTOMS THAT SHOULD BE REPORTED IMMEDIATELY:  *FEVER GREATER THAN 100.5 F  *CHILLS WITH OR WITHOUT FEVER  NAUSEA AND VOMITING THAT IS NOT CONTROLLED WITH YOUR NAUSEA MEDICATION  *UNUSUAL SHORTNESS OF BREATH  *UNUSUAL BRUISING OR BLEEDING  TENDERNESS IN MOUTH AND THROAT WITH OR WITHOUT PRESENCE OF ULCERS  *URINARY PROBLEMS  *BOWEL PROBLEMS  UNUSUAL RASH Items with * indicate a potential emergency and should be followed up as soon as possible.  One of the nurses will contact you 24 hours after your treatment. Please let the nurse know about any problems that you may have experienced. Feel free to call the clinic you have any questions or concerns. The clinic phone number is [phone].   I have been informed and understand all the instructions given to me. I know to contact the clinic, my physician, or go to the Emergency Department if any problems should occur. I do not have any questions at this time, but understand that I may call the clinic during office hours   should I have any questions or need assistance in obtaining follow up care.    __________________________________________  _____________  __________ Signature of Patient or Authorized Representative            Date                   Time    __________________________________________ Nurse's Signature    

## 2012-09-22 NOTE — Progress Notes (Signed)
This office note has been dictated.  #[id number]

## 2012-09-23 ENCOUNTER — Encounter: Payer: Self-pay | Admitting: Medical Oncology

## 2012-09-23 ENCOUNTER — Telehealth: Payer: Self-pay | Admitting: Medical Oncology

## 2012-09-23 NOTE — Telephone Encounter (Signed)
I called pt per Dr. Arline Asp to ask if he has gotten his flu vaccine for this year and if he has had the pneumonia vaccine. He has not gotten his flu vaccine yet and he did have the pneumonia vaccine but not sure how long it has been. He will call Dr. Selena Batten to follow up. Dr. Arline Asp notified.

## 2012-09-23 NOTE — Progress Notes (Signed)
CC:   [name], MD Barbaraann Share, MD,FCCP Bertram Millard. Dahlstedt, M.D. Massie Maroon, MD    PROBLEM LIST:  1. 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 Core needle biopsy on  04/21/2008 showed coexpression of CD5 and CD20, also CD19, CD21,  and CD22. CD10 was negative. The patient had involvement of the  peripheral blood and bone marrow when he was evaluated at [facility] in early June 2009. The  patient initially was treated with Rituxan and fludarabine, 4  cycles from 05/31/2008 through 08/22/2008.   The patient had an excellent response to his induction treatment with Rituxan and fludarabine.  Beginning in the fall of 2009, the patient was maintained on Rituxan every 3 months.  However, a CT scan of the abdomen and pelvis carried out without IV contrast on 05/12/2012 showed mild progression of mesenteric adenopathy when compared with the CT scan of the abdomen and pelvis carried out on 04/15/2011.  On 05/19/2012,  the patient began receiving Rituxan on a monthly basis. It should be noted that Mr. Bors underwent hepatitis screening on  10/18/2010. This consisted of hepatitis B surface antibody, hepatitis B  surface antigen, hepatitis B core antibody total, and hepatitis core  antibody IgM. All of these were negative.  2. Chronic cough possibly related to GERD.  3. Achalasia.  4. COPD.  5. History of kidney stones.  6. History of elevated PSA.  7. BPH.  8. Hearing impairment.  9. Sun related damage of skin.  10.Diverticulosis.  11.Multiple colonic polyps.  12.Macular degeneration, under the care of Dr. Mateo Flow.  13.Lithotripsy carried out on an obstructing distal left ureteral  stone on 05/18/2012 by Dr. Marcine Matar.   MEDICATIONS:  1. Ocuvite 1 tablet daily.  2. Prevacid 30 mg daily.  3. Proscar 5 mg daily.  4. Fish oil omega-3 fatty acids 1,000 mg daily.   5. Glucosamine chondroitin 500-400 mg daily.  6. Flomax 0.4 mg daily. 7. Lasix 20 mg daily for peripheral edema.   CURRENT CHEMOTHERAPY PROGRAM: Rituxan 375 mg/m2 equal to 600 mg IV  every month.    We will need to confirm dates of Pneumovax and flu shots.   SMOKING HISTORY: Patient has been a nonsmoker except for a pipe in the past.    HISTORY:  I saw Daniel [name] today for followup of his small lymphocytic B-cell non-Hodgkin's lymphoma presenting with a large abdominal mass involving the mesentery dating back to May 2009.  The patient had stage IV disease with peripheral blood and bone marrow involvement.  He recently turned 78 years old.  He was last seen by [place] on 08/20/2012.  He is currently receiving Rituxan about every month.  The patient denies any major changes in his condition.  He continues to feel generally well.  He continues to have a troublesome cough.  For the past 6 months or so the patient experiences a sense of chill and feeling febrile although his temperature is normal.  These episodes occur every 3-6 weeks usually at night.  He denies any night sweats.  The patient's wife is still in rehab facility following hip replacement.  As stated, the patient is without any new problems or complaints, and tolerates is a monthly Rituxan treatments without any problems.  PHYSICAL EXAMINATION:  General:  The patient shows little change. Weight is 132.7 pounds.  Height 5 feet 5 inches.  Body surface area 1.66 m2.  Vital signs:  Blood pressure 125/69.  Other vital signs are normal. There is no scleral icterus.  Mouth and pharynx are benign.  No peripheral adenopathy palpable.  No axillary or inguinal adenopathy. Lungs:  Inspiratory crackles and wheezing at both lower lung fields. Cardiac:  Regular rhythm without murmur or rub.  Abdomen:  Without masses.  He continues to have a fairly large hernia above his umbilical area.  Abdomen is soft and nontender.  No Port-A-Cath or  central catheter.  He has 1+ edema of both ankles.  He is not using his cane today, which he left in his car.  Neurologic:  Exam is nonfocal.  LABORATORY DATA:  Today, white count 11.2, ANC 8.4, hemoglobin 13.6, hematocrit 41.2, platelets 250,000.  Chemistries today notable for a total protein of 5.5 and an albumin of 2.9.  LDH was 164, BUN 13, and creatinine 1.  IMAGING STUDIES:  1. CT scan of the abdomen and pelvis with IV contrast from 03/04/2010  showed central mesenteric adenopathy. The largest left anterior  mesenteric lymph node measures 2.5 x 1.2 cm, felt to be stable in  size compared with the prior exam of 03/22/2009. No new adenopathy  was noted. There was marked enlargement of the prostate gland with  indentation of the urinary bladder base. There is a diverticulum  in the right lateral aspect of the urinary bladder, felt to be  stable. There was a stable compression fracture of T12 vertebral  body. There was patchy airspace disease in the right lower lobe  posteriorly, felt to be due to either infection or inflammatory  process.  2. Chest x-ray, 2 view, from 02/15/2011 showed stable chronic  bibasilar interstitial disease compared with the chest x-ray of  03/04/2010. No acute findings.  3. CT of the abdomen and pelvis with IV contrast from 04/15/2011  showed progression of peribronchial interstitial thickening within  the left and right lower lobes. There was mild interval increase  in adenopathy within the central small bowel mesentery. There was  increased hydroureter and mild hydronephrosis on the right. A  lymph node in the left abdomen measures 18 x 29 mm, similar to 15 x  27 mm on the prior study of 03/04/2010. It was felt that there  might be more lymph nodes notable within the mesentery. In the  pelvis, there were no enlarged lymph nodes. Again, there was  marked enlargement of the prostate gland.  4. CT scan of abdomen and pelvis without IV contrast on  05/12/2012 showed  progression of mesenteric adenopathy. Specifically an index node now  measures 4.5 x 2.4 cm on image 24 versus 2.4 x 1.3 cm on the prior CT  scan of 04/15/2011. There were scattered colonic diverticula. There  was no retrocrural or retroperitoneal adenopathy. There was moderate to  marked enlargement of the prostate with some mild bladder wall  thickening and a right-sided bladder diverticulum. There was fat-  containing ventral abdominal wall hernia. There was development of  moderate left-sided hydroureteronephrosis secondary to a distal left  ureteric stone. There was similar right-sided hydroureteronephrosis,  felt to be due to prostatic enlargement and bladder outlet obstruction.  There was improved appearance at the lung bases. T12 compression  deformity was unchanged and there was evidence of prior cholecystectomy.  There was a tiny hiatal hernia. 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 Heart size was normal without pericardial or pleural  effusion.  5. One-view abdomen on 05/18/2012 showed a 1 cm stone in the  distal  aspect of the left ureter, felt to be unchanged compared with the  abdominal CT scan of 05/12/2012.  6. One-view abdomen on 08/03/2012 showed possible distal left ureteral  stone measuring about 6 mm along the left lateral margin of the  lower sacrum. There are numerous phleboliths seen in the anatomic  pelvis bilaterally, making evaluation for a distal left ureteral  stone difficult.   IMPRESSION AND PLAN:  Mr. Huhta clinical condition remains stable.  He will receive another dose of Rituxan and Decadron today, 600 mg and 20 mg respectively.  We are setting him up for another office visit with treatment on November 12th at which time he will have CBC and chemistries.  Our plan is to repeat the CT scan of the abdomen and pelvis probably in early December and to compare that with the CT scan of abdomen and pelvis carried out  on 05/12/2012.  If the patient's disease is progressive, then he may be a candidate for bendamustine with or without Rituxan or possibly ofatumumab.    ______________________________ Samul Dada, M.D. DSM/MEDQ  D:  09/22/2012  T:  09/23/2012  Job:  [id number]

## 2012-09-25 ENCOUNTER — Telehealth: Payer: Self-pay | Admitting: Oncology

## 2012-09-25 NOTE — Telephone Encounter (Signed)
Talked to patient and gave her appt for 11/12 lab and MD, emailed Marcelino Duster to adjust chemo

## 2012-09-28 ENCOUNTER — Telehealth: Payer: Self-pay | Admitting: *Deleted

## 2012-09-28 NOTE — Telephone Encounter (Signed)
Per staff message and POF I have scheduled appts. JWM  

## 2012-09-29 ENCOUNTER — Telehealth: Payer: Self-pay | Admitting: Oncology

## 2012-09-29 NOTE — Telephone Encounter (Signed)
Called pt and left message regarding appt for November 2014 lab,MD and chemo

## 2012-10-20 ENCOUNTER — Encounter: Payer: Self-pay | Admitting: Oncology

## 2012-10-20 ENCOUNTER — Other Ambulatory Visit: Payer: Self-pay | Admitting: Oncology

## 2012-10-20 ENCOUNTER — Ambulatory Visit: Payer: Medicare Other | Admitting: Lab

## 2012-10-20 ENCOUNTER — Ambulatory Visit (HOSPITAL_BASED_OUTPATIENT_CLINIC_OR_DEPARTMENT_OTHER): Payer: Medicare Other

## 2012-10-20 ENCOUNTER — Telehealth: Payer: Self-pay | Admitting: Oncology

## 2012-10-20 ENCOUNTER — Ambulatory Visit (HOSPITAL_BASED_OUTPATIENT_CLINIC_OR_DEPARTMENT_OTHER): Payer: Medicare Other | Admitting: Oncology

## 2012-10-20 ENCOUNTER — Other Ambulatory Visit (HOSPITAL_BASED_OUTPATIENT_CLINIC_OR_DEPARTMENT_OTHER): Payer: Medicare Other

## 2012-10-20 VITALS — BP 101/61 | HR 84 | Temp 97.5°F | Resp 16

## 2012-10-20 VITALS — BP 118/73 | HR 74 | Temp 97.0°F | Resp 18 | Ht 65.0 in | Wt 134.0 lb

## 2012-10-20 DIAGNOSIS — C8599 Non-Hodgkin lymphoma, unspecified, extranodal and solid organ sites: Secondary | ICD-10-CM

## 2012-10-20 DIAGNOSIS — C8300 Small cell B-cell lymphoma, unspecified site: Secondary | ICD-10-CM

## 2012-10-20 DIAGNOSIS — C8583 Other specified types of non-Hodgkin lymphoma, intra-abdominal lymph nodes: Secondary | ICD-10-CM

## 2012-10-20 DIAGNOSIS — Z5112 Encounter for antineoplastic immunotherapy: Secondary | ICD-10-CM

## 2012-10-20 DIAGNOSIS — C8589 Other specified types of non-Hodgkin lymphoma, extranodal and solid organ sites: Secondary | ICD-10-CM

## 2012-10-20 LAB — COMPREHENSIVE METABOLIC PANEL (CC13)
ALT: 9 U/L (ref 0–55)
AST: 7 U/L (ref 5–34)
Albumin: 2.6 g/dL — ABNORMAL LOW (ref 3.5–5.0)
Alkaline Phosphatase: 81 U/L (ref 40–150)
BUN: 56 mg/dL — ABNORMAL HIGH (ref 7.0–26.0)
Calcium: 9.2 mg/dL (ref 8.4–10.4)
Chloride: 106 mEq/L (ref 98–107)
Potassium: 5.5 mEq/L — ABNORMAL HIGH (ref 3.5–5.1)
Sodium: 135 mEq/L — ABNORMAL LOW (ref 136–145)
Total Protein: 5.5 g/dL — ABNORMAL LOW (ref 6.4–8.3)

## 2012-10-20 LAB — CBC WITH DIFFERENTIAL/PLATELET
BASO%: 0.5 % (ref 0.0–2.0)
EOS%: 2.9 % (ref 0.0–7.0)
HCT: 39.9 % (ref 38.4–49.9)
HGB: 12.8 g/dL — ABNORMAL LOW (ref 13.0–17.1)
MCH: 28.2 pg (ref 27.2–33.4)
MCHC: 32.1 g/dL (ref 32.0–36.0)
MONO#: 1.2 10*3/uL — ABNORMAL HIGH (ref 0.1–0.9)
NEUT%: 65.1 % (ref 39.0–75.0)
RDW: 15.6 % — ABNORMAL HIGH (ref 11.0–14.6)
WBC: 8.7 10*3/uL (ref 4.0–10.3)
lymph#: 1.5 10*3/uL (ref 0.9–3.3)

## 2012-10-20 MED ORDER — ONDANSETRON 8 MG/50ML IVPB (CHCC)
8.0000 mg | Freq: Once | INTRAVENOUS | Status: AC
  Administered 2012-10-20: 8 mg via INTRAVENOUS

## 2012-10-20 MED ORDER — FAMOTIDINE IN NACL 20-0.9 MG/50ML-% IV SOLN
20.0000 mg | Freq: Once | INTRAVENOUS | Status: AC
  Administered 2012-10-20: 20 mg via INTRAVENOUS

## 2012-10-20 MED ORDER — DIPHENHYDRAMINE HCL 25 MG PO CAPS
25.0000 mg | ORAL_CAPSULE | Freq: Once | ORAL | Status: AC
  Administered 2012-10-20: 25 mg via ORAL

## 2012-10-20 MED ORDER — ACETAMINOPHEN 325 MG PO TABS
650.0000 mg | ORAL_TABLET | Freq: Once | ORAL | Status: AC
  Administered 2012-10-20: 650 mg via ORAL

## 2012-10-20 MED ORDER — SODIUM CHLORIDE 0.9 % IV SOLN
375.0000 mg/m2 | Freq: Once | INTRAVENOUS | Status: AC
  Administered 2012-10-20: 600 mg via INTRAVENOUS
  Filled 2012-10-20: qty 60

## 2012-10-20 MED ORDER — SODIUM CHLORIDE 0.9 % IV SOLN
Freq: Once | INTRAVENOUS | Status: AC
  Administered 2012-10-20: 11:00:00 via INTRAVENOUS

## 2012-10-20 MED ORDER — DEXAMETHASONE SODIUM PHOSPHATE 10 MG/ML IJ SOLN
20.0000 mg | Freq: Once | INTRAMUSCULAR | Status: AC
  Administered 2012-10-20: 20 mg via INTRAVENOUS

## 2012-10-20 NOTE — Patient Instructions (Addendum)
[facility] Discharge Instructions for Patients Receiving Chemotherapy  Today you received the following chemotherapy agents Rituxan   To help prevent nausea and vomiting after your treatment, we encourage you to take your nausea medication as prescribed If you develop nausea and vomiting that is not controlled by your nausea medication, call the clinic. If it is after clinic hours your family physician or the after hours number for the clinic or go to the Emergency Department.   BELOW ARE SYMPTOMS THAT SHOULD BE REPORTED IMMEDIATELY:  *FEVER GREATER THAN 100.5 F  *CHILLS WITH OR WITHOUT FEVER  NAUSEA AND VOMITING THAT IS NOT CONTROLLED WITH YOUR NAUSEA MEDICATION  *UNUSUAL SHORTNESS OF BREATH  *UNUSUAL BRUISING OR BLEEDING  TENDERNESS IN MOUTH AND THROAT WITH OR WITHOUT PRESENCE OF ULCERS  *URINARY PROBLEMS  *BOWEL PROBLEMS  UNUSUAL RASH Items with * indicate a potential emergency and should be followed up as soon as possible.  One of the nurses will contact you 24 hours after your treatment. Please let the nurse know about any problems that you may have experienced. Feel free to call the clinic you have any questions or concerns. The clinic phone number is [phone].   I have been informed and understand all the instructions given to me. I know to contact the clinic, my physician, or go to the Emergency Department if any problems should occur. I do not have any questions at this time, but understand that I may call the clinic during office hours   should I have any questions or need assistance in obtaining follow up care.    __________________________________________  _____________  __________ Signature of Patient or Authorized Representative            Date                   Time    __________________________________________ Nurse's Signature    

## 2012-10-20 NOTE — Progress Notes (Signed)
CC:   Daniel Gosling, MD Daniel Share, MD,FCCP Daniel [name], M.D. Daniel Maroon, MD  PROBLEM LIST:  1. 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 Core needle biopsy on  04/21/2008 showed coexpression of CD5 and CD20, also CD19, CD21,  and CD22. CD10 was negative. The patient had involvement of the  peripheral blood and bone marrow when he was evaluated at [facility] in early June 2009. The  patient initially was treated with Rituxan and fludarabine, 4  cycles from 05/31/2008 through 08/22/2008.  The patient had an excellent response to his induction treatment with  Rituxan and fludarabine. Beginning in the fall of 2009, the patient was  maintained on Rituxan every 3 months. However, a CT scan of the abdomen  and pelvis carried out without IV contrast on 05/12/2012 showed mild  progression of mesenteric adenopathy when compared with the CT scan of  the abdomen and pelvis carried out on 04/15/2011. On 05/19/2012,  the patient began receiving Rituxan on a monthly basis.  It should be noted that Daniel [name] underwent hepatitis screening on  10/18/2010. This consisted of hepatitis B surface antibody, hepatitis B  surface antigen, hepatitis B core antibody total, and hepatitis core  antibody IgM. All of these were negative.  2. Chronic cough possibly related to GERD.  3. Achalasia.  4. COPD.  5. History of kidney stones.  6. History of elevated PSA.  7. BPH.  8. Hearing impairment.  9. Sun related damage of skin.  10.Diverticulosis.  11.Multiple colonic polyps.  12.Macular degeneration, under the care of Dr. Mateo [name].  13.Lithotripsy carried out on an obstructing distal left ureteral  stone on 05/18/2012 by Dr. Marcine [name].    MEDICATIONS:  1. Ocuvite 1 tablet daily.  2. Prevacid 30 mg daily.  3. Proscar 5 mg daily.  4. Fish oil omega-3 fatty acids 1,000 mg daily.    5. Glucosamine chondroitin 500-400 mg daily.  6. Flomax 0.4 mg daily.  7. Lasix 20 mg daily for peripheral edema.   CURRENT CHEMOTHERAPY PROGRAM: Rituxan 375 mg/m2 equal to 600 mg IV  every month.   Pneumovax was given in the late 1990s.   The patient's last flu shot was on 09/19/2011.  Patient was urged to obtain a flu shot from his primary physician.    SMOKING HISTORY: Patient has been a nonsmoker except for a pipe in the past.    HISTORY:  Daniel [name] was seen today for followup of his small lymphocytic B-cell non-Hodgkin's lymphoma presenting with a large abdominal mass involving the mesentery, dating back to May 2009.  The patient had stage IV disease with peripheral blood and bone marrow involvement.  Daniel [name] was last seen by [place] on 09/22/2012 at which time he received another dose of IV Rituxan and Decadron.  He tolerates these treatments without any problems.  The patient denies any change in his condition and feels generally well.  He tells me that his wife is now home from rehab following a total hip replacement.  The patient continues to have persistent coughing, as he has had for some time.  Some days are better than others.  Overall there has been no change.  PHYSICAL EXAMINATION:  There is little change.  Weight is 134 pounds. Height 5 feet 5 inches.  Body surface area 1.67 meters square.  Blood pressure 118/73.  Other vital signs are normal.  O2 saturation on  room air at rest was 94% to 95%.  The patient still has a troublesome unproductive cough.  There was no scleral icterus.  Mouth and pharynx benign.  No peripheral adenopathy palpable.  No axillary or inguinal adenopathy.  Lungs:  Inspiratory and expiratory crackles bilaterally.  I did not hear any wheezing today.  Cardiac:  Regular rhythm without murmur or rub.  The patient does not have a Port-A-Cath or central catheter.  Abdomen:  Unchanged without palpable mass but fairly large supraumbilical and  umbilical hernias with possible diastasis recti.  No tenderness.  He has 2+ edema of the right leg without tenderness and 1+ edema of the left leg.  I did not see a cane with the patient today. Neurologic:  Nonfocal.  LABORATORY DATA:  Today, white count 8.7, ANC 5.6, hemoglobin 12.8, hematocrit 39.9, platelets 162,000.  Chemistries are pending. Chemistries from 09/22/2012 were essentially normal except for an albumin of 2.9.  LDH was 164.  BUN 13, creatinine 1.0.  IMAGING STUDIES:  1. CT scan of the abdomen and pelvis with IV contrast from 03/04/2010  showed central mesenteric adenopathy. The largest left anterior  mesenteric lymph node measures 2.5 x 1.2 cm, felt to be stable in  size compared with the prior exam of 03/22/2009. No new adenopathy  was noted. There was marked enlargement of the prostate gland with  indentation of the urinary bladder base. There is a diverticulum  in the right lateral aspect of the urinary bladder, felt to be  stable. There was a stable compression fracture of T12 vertebral  body. There was patchy airspace disease in the right lower lobe  posteriorly, felt to be due to either infection or inflammatory  process.  2. Chest x-ray, 2 view, from 02/15/2011 showed stable chronic  bibasilar interstitial disease compared with the chest x-ray of  03/04/2010. No acute findings.  3. CT of the abdomen and pelvis with IV contrast from 04/15/2011  showed progression of peribronchial interstitial thickening within  the left and right lower lobes. There was mild interval increase  in adenopathy within the central small bowel mesentery. There was  increased hydroureter and mild hydronephrosis on the right. A  lymph node in the left abdomen measures 18 x 29 mm, similar to 15 x  27 mm on the prior study of 03/04/2010. It was felt that there  might be more lymph nodes notable within the mesentery. In the  pelvis, there were no enlarged lymph nodes. Again, there was    marked enlargement of the prostate gland.  4. CT scan of abdomen and pelvis without IV contrast on 05/12/2012 showed  progression of mesenteric adenopathy. Specifically an index node now  measures 4.5 x 2.4 cm on image 24 versus 2.4 x 1.3 cm on the prior CT  scan of 04/15/2011. There were scattered colonic diverticula. There  was no retrocrural or retroperitoneal adenopathy. There was moderate to  marked enlargement of the prostate with some mild bladder wall  thickening and a right-sided bladder diverticulum. There was fat-  containing ventral abdominal wall hernia. There was development of  moderate left-sided hydroureteronephrosis secondary to a distal left  ureteric stone. There was similar right-sided hydroureteronephrosis,  felt to be due to prostatic enlargement and bladder outlet obstruction.  There was improved appearance at the lung bases. T12 compression  deformity was unchanged and there was evidence of prior cholecystectomy.  There was a tiny hiatal hernia. 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 Heart size was normal  without pericardial or pleural  effusion.  5. One-view abdomen on 05/18/2012 showed a 1 cm stone in the distal  aspect of the left ureter, felt to be unchanged compared with the  abdominal CT scan of 05/12/2012.  6. One-view abdomen on 08/03/2012 showed possible distal left ureteral  stone measuring about 6 mm along the left lateral margin of the  lower sacrum. There are numerous phleboliths seen in the anatomic  pelvis bilaterally, making evaluation for a distal left ureteral  stone difficult.   IMPRESSION/PLAN:  There have been no significant changes in Daniel [name] condition.  He will receive another dose of IV Rituxan 600 mg and Decadron 20 mg.  Our plan is to repeat imaging studies, specifically a chest x-ray, 2 views, and CT scan of abdomen and pelvis with IV contrast to be carried out somewhere around December 6th prior to the  patient's next appointment with me about December 12th.  At that time, he will be due for another treatment.  We will check a CBC, chemistries, and LDH.  If the patient's disease seems to be progressing, he may be a candidate for bendamustine in combination with Rituxan or ofatumumab.    ______________________________ Samul Dada, M.D. DSM/MEDQ  D:  10/20/2012  T:  10/20/2012  Job:  [id number]

## 2012-10-20 NOTE — Telephone Encounter (Signed)
gv and printed pt appt schedule for DEC 2013 for lab, appt...emailed michelle to add chemo..the patient aware

## 2012-10-20 NOTE — Progress Notes (Signed)
This office note has been dictated.  #[id number]

## 2012-10-20 NOTE — Patient Instructions (Signed)
Please be sure to get your flu shot.

## 2012-10-21 ENCOUNTER — Encounter: Payer: Self-pay | Admitting: Medical Oncology

## 2012-10-21 ENCOUNTER — Telehealth: Payer: Self-pay | Admitting: Medical Oncology

## 2012-10-21 ENCOUNTER — Telehealth: Payer: Self-pay | Admitting: Oncology

## 2012-10-21 NOTE — Progress Notes (Unsigned)
I called pt to inform him that Dr.Murinson is going to recheck some labs 10/22/12. He had several values that were elevated and he needs to re-evaluate.

## 2012-10-21 NOTE — Telephone Encounter (Signed)
Added lb for 11/14. S/w pt he is aware. Also informed pt that inf after 12/12 lb/fu has been added.

## 2012-10-21 NOTE — Telephone Encounter (Signed)
I called pt per Dr. Arline Asp to let him know he will be getting a call from the schedulers about a lab appointment for 10/22/12. I explained that he had several values that were elevated and he would like to recheck. He voiced understanding.

## 2012-10-22 ENCOUNTER — Other Ambulatory Visit (HOSPITAL_BASED_OUTPATIENT_CLINIC_OR_DEPARTMENT_OTHER): Payer: Medicare Other

## 2012-10-22 DIAGNOSIS — C8599 Non-Hodgkin lymphoma, unspecified, extranodal and solid organ sites: Secondary | ICD-10-CM

## 2012-10-22 DIAGNOSIS — C8300 Small cell B-cell lymphoma, unspecified site: Secondary | ICD-10-CM

## 2012-10-22 LAB — COMPREHENSIVE METABOLIC PANEL (CC13)
ALT: 54 U/L (ref 0–55)
AST: 39 U/L — ABNORMAL HIGH (ref 5–34)
Albumin: 2.8 g/dL — ABNORMAL LOW (ref 3.5–5.0)
Alkaline Phosphatase: 115 U/L (ref 40–150)
Potassium: 4 mEq/L (ref 3.5–5.1)
Sodium: 138 mEq/L (ref 136–145)
Total Bilirubin: 0.71 mg/dL (ref 0.20–1.20)
Total Protein: 5 g/dL — ABNORMAL LOW (ref 6.4–8.3)

## 2012-10-23 ENCOUNTER — Ambulatory Visit: Payer: Medicare Other | Admitting: Oncology

## 2012-10-23 ENCOUNTER — Other Ambulatory Visit: Payer: Medicare Other | Admitting: Lab

## 2012-11-13 ENCOUNTER — Ambulatory Visit (HOSPITAL_COMMUNITY)
Admission: RE | Admit: 2012-11-13 | Discharge: 2012-11-13 | Disposition: A | Discharge status: Home / Self Care | Payer: Medicare Other | Source: Ambulatory Visit | Attending: Oncology | Admitting: Oncology

## 2012-11-13 DIAGNOSIS — C8300 Small cell B-cell lymphoma, unspecified site: Secondary | ICD-10-CM

## 2012-11-13 DIAGNOSIS — C8599 Non-Hodgkin lymphoma, unspecified, extranodal and solid organ sites: Secondary | ICD-10-CM | POA: Insufficient documentation

## 2012-11-13 MED ORDER — IOHEXOL 300 MG/ML  SOLN
100.0000 mL | Freq: Once | INTRAMUSCULAR | Status: AC | PRN
  Administered 2012-11-13: 100 mL via INTRAVENOUS

## 2012-11-17 ENCOUNTER — Encounter: Payer: Self-pay | Admitting: Oncology

## 2012-11-17 NOTE — Progress Notes (Signed)
CT scan of the abdomen and pelvis with IV contrast was carried out on 11/13/2012 and compared with the prior study of 07/02/2012. The study was reviewed. There is a cluster of lymph nodes per the left side of the mesentery. There are no significant changes. There may be some slight improvement in the scan.

## 2012-11-19 ENCOUNTER — Telehealth: Payer: Self-pay | Admitting: Oncology

## 2012-11-19 ENCOUNTER — Other Ambulatory Visit (HOSPITAL_BASED_OUTPATIENT_CLINIC_OR_DEPARTMENT_OTHER): Payer: Medicare Other | Admitting: Lab

## 2012-11-19 ENCOUNTER — Ambulatory Visit (HOSPITAL_BASED_OUTPATIENT_CLINIC_OR_DEPARTMENT_OTHER): Payer: Medicare Other | Admitting: Family

## 2012-11-19 ENCOUNTER — Ambulatory Visit (HOSPITAL_BASED_OUTPATIENT_CLINIC_OR_DEPARTMENT_OTHER): Payer: Medicare Other

## 2012-11-19 ENCOUNTER — Encounter: Payer: Self-pay | Admitting: Family

## 2012-11-19 VITALS — BP 91/56 | HR 76 | Temp 96.9°F | Resp 18

## 2012-11-19 VITALS — BP 133/75 | HR 75 | Temp 96.8°F | Resp 18 | Ht 65.0 in | Wt 136.0 lb

## 2012-11-19 DIAGNOSIS — C8300 Small cell B-cell lymphoma, unspecified site: Secondary | ICD-10-CM

## 2012-11-19 DIAGNOSIS — Z5112 Encounter for antineoplastic immunotherapy: Secondary | ICD-10-CM

## 2012-11-19 DIAGNOSIS — I10 Essential (primary) hypertension: Secondary | ICD-10-CM

## 2012-11-19 DIAGNOSIS — R05 Cough: Secondary | ICD-10-CM

## 2012-11-19 DIAGNOSIS — C8583 Other specified types of non-Hodgkin lymphoma, intra-abdominal lymph nodes: Secondary | ICD-10-CM

## 2012-11-19 DIAGNOSIS — D126 Benign neoplasm of colon, unspecified: Secondary | ICD-10-CM

## 2012-11-19 LAB — CBC WITH DIFFERENTIAL/PLATELET
EOS%: 3.2 % (ref 0.0–7.0)
Eosinophils Absolute: 0.3 10*3/uL (ref 0.0–0.5)
LYMPH%: 18.6 % (ref 14.0–49.0)
MCH: 28.5 pg (ref 27.2–33.4)
MCV: 88.2 fL (ref 79.3–98.0)
MONO%: 12.1 % (ref 0.0–14.0)
NEUT#: 7 10*3/uL — ABNORMAL HIGH (ref 1.5–6.5)
Platelets: 166 10*3/uL (ref 140–400)
RBC: 4.84 10*6/uL (ref 4.20–5.82)
RDW: 16.2 % — ABNORMAL HIGH (ref 11.0–14.6)
nRBC: 0 % (ref 0–0)

## 2012-11-19 LAB — COMPREHENSIVE METABOLIC PANEL (CC13)
AST: 23 U/L (ref 5–34)
BUN: 20 mg/dL (ref 7.0–26.0)
Calcium: 9.1 mg/dL (ref 8.4–10.4)
Chloride: 107 mEq/L (ref 98–107)
Creatinine: 1.1 mg/dL (ref 0.7–1.3)
Total Bilirubin: 0.63 mg/dL (ref 0.20–1.20)

## 2012-11-19 LAB — LACTATE DEHYDROGENASE (CC13): LDH: 251 U/L — ABNORMAL HIGH (ref 125–245)

## 2012-11-19 MED ORDER — SODIUM CHLORIDE 0.9 % IV SOLN
375.0000 mg/m2 | Freq: Once | INTRAVENOUS | Status: AC
  Administered 2012-11-19: 600 mg via INTRAVENOUS
  Filled 2012-11-19: qty 60

## 2012-11-19 MED ORDER — DEXAMETHASONE SODIUM PHOSPHATE 10 MG/ML IJ SOLN
20.0000 mg | Freq: Once | INTRAMUSCULAR | Status: AC
  Administered 2012-11-19: 20 mg via INTRAVENOUS

## 2012-11-19 MED ORDER — FAMOTIDINE IN NACL 20-0.9 MG/50ML-% IV SOLN
20.0000 mg | Freq: Once | INTRAVENOUS | Status: AC
  Administered 2012-11-19: 20 mg via INTRAVENOUS

## 2012-11-19 MED ORDER — DIPHENHYDRAMINE HCL 25 MG PO CAPS
25.0000 mg | ORAL_CAPSULE | Freq: Once | ORAL | Status: AC
  Administered 2012-11-19: 25 mg via ORAL

## 2012-11-19 MED ORDER — ACETAMINOPHEN 325 MG PO TABS
650.0000 mg | ORAL_TABLET | Freq: Once | ORAL | Status: AC
  Administered 2012-11-19: 650 mg via ORAL

## 2012-11-19 MED ORDER — ONDANSETRON 8 MG/50ML IVPB (CHCC)
8.0000 mg | Freq: Once | INTRAVENOUS | Status: AC
  Administered 2012-11-19: 8 mg via INTRAVENOUS

## 2012-11-19 MED ORDER — SODIUM CHLORIDE 0.9 % IV SOLN
Freq: Once | INTRAVENOUS | Status: AC
  Administered 2012-11-19: 10:00:00 via INTRAVENOUS

## 2012-11-19 NOTE — Progress Notes (Signed)
Patient ID: Daniel [name], male   DOB: Mar 25, 1919, 78 y.o.   MRN: [id number] CSN: [id number]  CC: Daniel Gosling, MD  Daniel Share, MD,FCCP  Daniel Millard. Retta Diones, MD Daniel Maroon, MD   Problem List: Daniel [name] is a 78 y.o. Caucasian male with a problem list consisting of:  1. Small lymphocytic B-cell non-Hodgkin lymphoma presenting with a large abdominal mass in the mesentery. Core needle biopsy on 04/21/2008 showed coexpression of CD5 and CD20, also CD19, CD21, and CD22. CD10 was negative. The patient had involvement of the peripheral blood and bone marrow when he was evaluated at [facility] in early June 2009. The patient initially was treated with Rituxan and fludarabine, 4 cycles from 05/31/2008 through 08/22/2008. The patient had an excellent response to his induction treatment with Rituxan and fludarabine. Beginning in the fall of 2009, the patient was maintained on Rituxan every 3 months. However, a CT scan of the abdomen and pelvis carried out without IV contrast on 05/12/2012 showed mild progression of mesenteric adenopathy when compared with the CT scan of the abdomen and pelvis carried out on 04/15/2011. On 05/19/2012, the patient began receiving Rituxan on a monthly basis. It should be noted that Daniel [name] underwent hepatitis screening on 10/18/2010. This consisted of hepatitis B surface antibody, hepatitis B surface antigen, hepatitis B core antibody total, and hepatitis core antibody IgM. All of these were negative. Chest x-ray and CT of abdomen/pelvis on 11/13/2012 do not show disease progression and showed stable left-sided mesenteric lymphadenopathy. 2. Chronic cough possibly related to GERD 3. Achalasia 4. COPD 5. History of kidney stones  6. History of elevated PSA 7. BPH  8. Hearing impairment 9. Sun related damage of skin 10.Diverticulosis 11.Multiple colonic polyps  12.Macular degeneration, under the care of Daniel [name].   13.Lithotripsy carried out on an obstructing distal left ureteral stone on 05/18/2012 by Daniel [name].  The patient's last flu shot was on 09/19/2011. Patient was urged to obtain a flu shot from his primary physician. Pneumovax was given in the late 1990s.   Current Chemotherapy Program: Rituxan 375 mg/m2 equal to 600 mg IV every month.  Daniel [name] and I saw Daniel [name] was seen today for followup of his small lymphocytic B-cell non-Hodgkin's lymphoma presenting with a large  abdominal mass involving the mesentery, dating back to May 2009. The patient had stage IV disease with peripheral blood and bone marrow involvement.  Daniel [name] was last seen by [place] on 10/20/2012 at which time he received another dose of IV Rituxan and Decadron. He tolerates these treatments without any problems. The patient denies any change in his condition and feels generally well. His wife is continues recovery at their home following a total hip replacement. The patient continues to have persistent coughing, as he has had for some time. Overall there has been no change and he denies any other symptomatology.  The results of the 11/13/2012 chest x-ray and CT of the abdomen/pelvis were reviewed with him and the patient received copies of the reports.    Past Medical History: Past Medical History  Diagnosis Date  . Chronic kidney disease   . NHL (non-Hodgkin's lymphoma)     nhl dx 5/09;  . HOH (hard of hearing)   . Diverticulosis   . BPH (benign prostatic hyperplasia)   . Congenital malformation of spine     T12  . Hx of hydronephrosis     Surgical History: Past  Surgical History  Procedure Date  . Cholecystectomy   . Prostate surgery   . Eye surgery     Implants bilateral  . Inguinal hernia repair     left    Current Medications: Current Outpatient Prescriptions  Medication Sig Dispense Refill  . finasteride (PROSCAR) 5 MG tablet Take 5 mg by mouth daily with breakfast.       . fish  oil-omega-3 fatty acids 1000 MG capsule Take 1 g by mouth daily with breakfast.       . furosemide (LASIX) 20 MG tablet Take 20 mg by mouth daily.      Marland Kitchen glucosamine-chondroitin 500-400 MG tablet Take 1 tablet by mouth daily.        . Hypromellose (GENTEAL OP) Place 1 drop into both eyes as needed. Dry eyes      . lansoprazole (PREVACID) 30 MG capsule Take 30 mg by mouth daily.        . Multiple Vitamins-Minerals (ICAPS MV PO) Take 1 tablet by mouth daily.        Allergies: No Known Allergies  Family History: Family History  Problem Relation Age of Onset  . Tuberculosis Mother   . Blindness Father     Social History: History  Substance Use Topics  . Smoking status: Former Smoker    Types: Pipe    Quit date: 05/09/1972  . Smokeless tobacco: Former Neurosurgeon  . Alcohol Use: 0.6 oz/week    1 Glasses of wine per week     Comment: Rarely    Review of Systems: 10 Point review of systems was completed and is negative except as noted above.   Physical Exam:   Blood pressure 133/75, pulse 75, temperature 96.8 F (36 C), temperature source Oral, resp. rate 18, height 5\' 5"  (1.651 m), weight 136 lb (61.689 kg). General appearance: Alert, cooperative, well nourished, no apparent distress Head: Normocephalic, without obvious abnormality, atraumatic Eyes: Conjunctivae/corneas clear, PERRLA, EOMI Ears: Bilateral hearing aids Nose: Nares, septum and mucosa are normal, drainage, no sinus tenderness Neck: No adenopathy, supple, symmetrical, trachea midline and thyroid not enlarged, symmetric, no tenderness/mass/nodules Resp: Bilateral scattered, harsh/coarse rales Cardio: Regular rate and rhythm, S1, S2 normal, 1/6 murmur, no click, rub or gallop GI: Soft, distended, RLQ mass/hernia, non-tender, normoactive bowel sounds, no organomegaly felt from the patient in a sitting position Extremities: Extremities atraumatic, no cyanosis,  bilateral LE +1 edema Skin: Bilateral UE seborrheic  keratosis Lymph nodes: Cervical, supraclavicular, and axillary nodes normal Neurologic: Grossly normal   Laboratory Data: Results for orders placed in visit on 11/19/12 (from the past 48 hour(s))  CBC WITH DIFFERENTIAL     Status: Abnormal   Collection Time   11/19/12  8:23 AM      Component Value Range Comment   WBC 10.7 (*) 4.0 - 10.3 10e3/uL    NEUT# 7.0 (*) 1.5 - 6.5 10e3/uL    HGB 13.8  13.0 - 17.1 g/dL    HCT 60.4  54.0 - 98.1 %    Platelets 166  140 - 400 10e3/uL    MCV 88.2  79.3 - 98.0 fL    MCH 28.5  27.2 - 33.4 pg    MCHC 32.3  32.0 - 36.0 g/dL    RBC 1.91  4.78 - 2.95 10e6/uL    RDW 16.2 (*) 11.0 - 14.6 %    lymph# 2.0  0.9 - 3.3 10e3/uL    MONO# 1.3 (*) 0.1 - 0.9 10e3/uL    Eosinophils Absolute 0.3  0.0 - 0.5 10e3/uL    Basophils Absolute 0.0  0.0 - 0.1 10e3/uL    NEUT% 65.7  39.0 - 75.0 %    LYMPH% 18.6  14.0 - 49.0 %    MONO% 12.1  0.0 - 14.0 %    EOS% 3.2  0.0 - 7.0 %    BASO% 0.4  0.0 - 2.0 %    nRBC 0  0 - 0 %   TECHNOLOGIST REVIEW     Status: Normal   Collection Time   11/19/12  8:23 AM      Component Value Range Comment   Technologist Review Few Variant lymphs present        Imaging Studies: 1. CT scan of the abdomen and pelvis with IV contrast from 03/04/2010 showed central mesenteric adenopathy. The largest left anterior mesenteric lymph node measures 2.5 x 1.2 cm, felt to be stable in size compared with the prior exam of 03/22/2009. No new adenopathy was noted. There was marked enlargement of the prostate gland with indentation of the urinary bladder base. There is a diverticulum in the right lateral aspect of the urinary bladder, felt to be stable. There was a stable compression fracture of T12 vertebral body. There was patchy airspace disease in the right lower lobe  posteriorly, felt to be due to either infection or inflammatory process.  2. Chest x-ray, 2 view, from 02/15/2011 showed stable chronic bibasilar interstitial disease compared with the  chest x-ray of 03/04/2010. No acute findings.  3. CT of the abdomen and pelvis with IV contrast from 04/15/2011 showed progression of peribronchial interstitial thickening within the left and right lower lobes. There was mild interval increase in adenopathy within the central small bowel mesentery. There was increased hydroureter and mild hydronephrosis on the right. A lymph node in the left abdomen measures 18 x 29 mm, similar to 15 x 27 mm on the prior study of 03/04/2010. It was felt that there might be more lymph nodes notable within the mesentery. In the pelvis, there were no enlarged lymph nodes. Again, there was marked enlargement of the prostate gland.  4. CT scan of abdomen and pelvis without IV contrast on 05/12/2012 showed progression of mesenteric adenopathy. Specifically an index node now  measures 4.5 x 2.4 cm on image 24 versus 2.4 x 1.3 cm on the prior CT scan of 04/15/2011. There were scattered colonic diverticula. There was no retrocrural or retroperitoneal adenopathy. There was moderate to marked enlargement of the prostate with some mild bladder wall thickening and a right-sided bladder diverticulum. There was fat-containing ventral abdominal wall hernia. There was development of moderate left-sided hydroureteronephrosis secondary to a distal left ureteric stone. There was similar right-sided hydroureteronephrosis, felt to be due to prostatic enlargement and bladder outlet obstruction. There was improved appearance at the lung bases. T12 compression deformity was unchanged and there was evidence of prior cholecystectomy. There was a tiny hiatal hernia. 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 Heart size was normal without pericardial or pleural effusion.  5. One-view abdomen on 05/18/2012 showed a 1 cm stone in the distal aspect of the left ureter, felt to be unchanged compared with the abdominal CT scan of 05/12/2012.  6. One-view abdomen on 08/03/2012 showed possible distal  left ureteral stone measuring about 6 mm along the left lateral margin of the lower sacrum. There are numerous phleboliths seen in the anatomic pelvis bilaterally, making evaluation for a distal left ureteral stone difficult. 7. Chest x-ray 2 view on 11/13/2012  showed no acute finding.  Chronic interstitial change appears stable compared to prior exam from 03/04/2010.   8. CT scan of the abdomen and pelvis with IV contrast  On 11/13/2012 showed chronic lower lobe bronchiectasis and peribronchial disease. Stable left-sided mesenteric lymphadenopathy. Stable right-sided hydroureteronephrosis.  No obstructing ureteral calculi.  Stable bladder diverticulum and enlarged prostate gland. No acute abdominal/pelvic findings.    Impression/Plan: There have been no significant changes in Mr. Leider condition. He will receive another dose of IV Rituxan 600 mg and Decadron 20 mg today.  Mr. Enderson will return on 12/17/2012 for treatment #23.  We will check a CBC, chemistries, and LDH at that time.  Daniel [name] is considering extending the time between office visits and may discuss this with the patient in the future.  Mr. Roots is encourage to contact us in the interim if he has any questions or concerns.   Larina Bras, NP-C 11/19/2012, 8:45 AM

## 2012-11-19 NOTE — Patient Instructions (Addendum)
[facility] Discharge Instructions for Patients Receiving Chemotherapy  Today you received the following chemotherapy agents Rituxan.    If you develop nausea and vomiting that is not controlled by your nausea medication, call the clinic. If it is after clinic hours your family physician or the after hours number for the clinic or go to the Emergency Department.   BELOW ARE SYMPTOMS THAT SHOULD BE REPORTED IMMEDIATELY:  *FEVER GREATER THAN 100.5 F  *CHILLS WITH OR WITHOUT FEVER  NAUSEA AND VOMITING THAT IS NOT CONTROLLED WITH YOUR NAUSEA MEDICATION  *UNUSUAL SHORTNESS OF BREATH  *UNUSUAL BRUISING OR BLEEDING  TENDERNESS IN MOUTH AND THROAT WITH OR WITHOUT PRESENCE OF ULCERS  *URINARY PROBLEMS  *BOWEL PROBLEMS  UNUSUAL RASH Items with * indicate a potential emergency and should be followed up as soon as possible.  Feel free to call the clinic you have any questions or concerns. The clinic phone number is [phone].    Rituximab injection What is this medicine? RITUXIMAB (ri TUX i mab) is a monoclonal antibody. This medicine changes the way the body's immune system works. It is used commonly to treat non-Hodgkin's lymphoma and other conditions. In cancer cells, this drug targets a specific protein within cancer cells and stops the cancer cells from growing. It is also used to treat rhuematoid arthritis (RA). In RA, this medicine slow the inflammatory process and help reduce joint pain and swelling. This medicine is often used with other cancer or arthritis medications. This medicine may be used for other purposes; ask your health care provider or pharmacist if you have questions. What should I tell my health care provider before I take this medicine? They need to know if you have any of these conditions: -blood disorders -heart disease -history of hepatitis B -infection (especially a virus infection such as chickenpox, cold sores, or herpes) -irregular  heartbeat -kidney disease -lung or breathing disease, like asthma -lupus -an unusual or allergic reaction to rituximab, mouse proteins, other medicines, foods, dyes, or preservatives -pregnant or trying to get pregnant -breast-feeding How should I use this medicine? This medicine is for infusion into a vein. It is administered in a hospital or clinic by a specially trained health care professional. A special MedGuide will be given to you by the pharmacist with each prescription and refill. Be sure to read this information carefully each time. Talk to your pediatrician regarding the use of this medicine in children. This medicine is not approved for use in children. Overdosage: If you think you have taken too much of this medicine contact a poison control center or emergency room at once. NOTE: This medicine is only for you. Do not share this medicine with others. What if I miss a dose? It is important not to miss a dose. Call your doctor or health care professional if you are unable to keep an appointment. What may interact with this medicine? -cisplatin -medicines for blood pressure -some other medicines for arthritis -vaccines This list may not describe all possible interactions. Give your health care provider a list of all the medicines, herbs, non-prescription drugs, or dietary supplements you use. Also tell them if you smoke, drink alcohol, or use illegal drugs. Some items may interact with your medicine. What should I watch for while using this medicine? Report any side effects that you notice during your treatment right away, such as changes in your breathing, fever, chills, dizziness or lightheadedness. These effects are more common with the first dose. Visit your prescriber  or health care professional for checks on your progress. You will need to have regular blood work. Report any other side effects. The side effects of this medicine can continue after you finish your treatment.  Continue your course of treatment even though you feel ill unless your doctor tells you to stop. Call your doctor or health care professional for advice if you get a fever, chills or sore throat, or other symptoms of a cold or flu. Do not treat yourself. This drug decreases your body's ability to fight infections. Try to avoid being around people who are sick. This medicine may increase your risk to bruise or bleed. Call your doctor or health care professional if you notice any unusual bleeding. Be careful brushing and flossing your teeth or using a toothpick because you may get an infection or bleed more easily. If you have any dental work done, tell your dentist you are receiving this medicine. Avoid taking products that contain aspirin, acetaminophen, ibuprofen, naproxen, or ketoprofen unless instructed by your doctor. These medicines may hide a fever. Do not become pregnant while taking this medicine. Women should inform their doctor if they wish to become pregnant or think they might be pregnant. There is a potential for serious side effects to an unborn child. Talk to your health care professional or pharmacist for more information. Do not breast-feed an infant while taking this medicine. What side effects may I notice from receiving this medicine? Side effects that you should report to your doctor or health care professional as soon as possible: -allergic reactions like skin rash, itching or hives, swelling of the face, lips, or tongue -low blood counts - this medicine may decrease the number of white blood cells, red blood cells and platelets. You may be at increased risk for infections and bleeding. -signs of infection - fever or chills, cough, sore throat, pain or difficulty passing urine -signs of decreased platelets or bleeding - bruising, pinpoint red spots on the skin, black, tarry stools, blood in the urine -signs of decreased red blood cells - unusually weak or tired, fainting spells,  lightheadedness -breathing problems -confused, not responsive -chest pain -fast, irregular heartbeat -feeling faint or lightheaded, falls -mouth sores -redness, blistering, peeling or loosening of the skin, including inside the mouth -stomach pain -swelling of the ankles, feet, or hands -trouble passing urine or change in the amount of urine Side effects that usually do not require medical attention (report to your doctor or other health care professional if they continue or are bothersome): -anxiety -headache -loss of appetite -muscle aches -nausea -night sweats This list may not describe all possible side effects. Call your doctor for medical advice about side effects. You may report side effects to FDA at 1-800-FDA-1088. Where should I keep my medicine? This drug is given in a hospital or clinic and will not be stored at home. NOTE: This sheet is a summary. It may not cover all possible information. If you have questions about this medicine, talk to your doctor, pharmacist, or health care provider.  2013, [employer]. (07/25/2008 2:04:59 PM)

## 2012-11-19 NOTE — Patient Instructions (Addendum)
Please contact us at [phone] if you have any questions or concerns. 

## 2012-11-19 NOTE — Telephone Encounter (Signed)
Rayna Sexton nurse to Dr. Arline Asp, left message regarding apt for 12/17/12

## 2012-11-25 ENCOUNTER — Telehealth: Payer: Self-pay | Admitting: Oncology

## 2012-11-25 NOTE — Telephone Encounter (Signed)
Not able to reach pt re appt for 1/9 /14 or lm. Also cell not taking incoming calls. January schedule mailed today.

## 2012-12-17 ENCOUNTER — Encounter: Payer: Self-pay | Admitting: Oncology

## 2012-12-17 ENCOUNTER — Ambulatory Visit (HOSPITAL_BASED_OUTPATIENT_CLINIC_OR_DEPARTMENT_OTHER): Payer: Medicare Other | Admitting: Oncology

## 2012-12-17 ENCOUNTER — Other Ambulatory Visit (HOSPITAL_BASED_OUTPATIENT_CLINIC_OR_DEPARTMENT_OTHER): Payer: Medicare Other | Admitting: Lab

## 2012-12-17 ENCOUNTER — Telehealth: Payer: Self-pay | Admitting: Oncology

## 2012-12-17 ENCOUNTER — Ambulatory Visit (HOSPITAL_BASED_OUTPATIENT_CLINIC_OR_DEPARTMENT_OTHER): Payer: Medicare Other

## 2012-12-17 VITALS — BP 133/62 | HR 78 | Temp 97.3°F | Resp 18 | Ht 65.0 in | Wt 137.8 lb

## 2012-12-17 VITALS — BP 100/57 | HR 71 | Temp 96.9°F | Resp 18

## 2012-12-17 DIAGNOSIS — C8300 Small cell B-cell lymphoma, unspecified site: Secondary | ICD-10-CM

## 2012-12-17 DIAGNOSIS — C8583 Other specified types of non-Hodgkin lymphoma, intra-abdominal lymph nodes: Secondary | ICD-10-CM

## 2012-12-17 DIAGNOSIS — Z5112 Encounter for antineoplastic immunotherapy: Secondary | ICD-10-CM

## 2012-12-17 LAB — COMPREHENSIVE METABOLIC PANEL (CC13)
ALT: 10 U/L (ref 0–55)
AST: 18 U/L (ref 5–34)
Albumin: 3 g/dL — ABNORMAL LOW (ref 3.5–5.0)
Calcium: 9.2 mg/dL (ref 8.4–10.4)
Chloride: 104 mEq/L (ref 98–107)
Creatinine: 1.1 mg/dL (ref 0.7–1.3)
Potassium: 3.9 mEq/L (ref 3.5–5.1)
Sodium: 140 mEq/L (ref 136–145)
Total Protein: 5.8 g/dL — ABNORMAL LOW (ref 6.4–8.3)

## 2012-12-17 LAB — CBC WITH DIFFERENTIAL/PLATELET
BASO%: 0.7 % (ref 0.0–2.0)
Eosinophils Absolute: 0.3 10*3/uL (ref 0.0–0.5)
MCHC: 31.7 g/dL — ABNORMAL LOW (ref 32.0–36.0)
MONO#: 0.9 10*3/uL (ref 0.1–0.9)
NEUT#: 4.9 10*3/uL (ref 1.5–6.5)
RBC: 4.74 10*6/uL (ref 4.20–5.82)
RDW: 16.2 % — ABNORMAL HIGH (ref 11.0–14.6)
WBC: 7.6 10*3/uL (ref 4.0–10.3)
lymph#: 1.5 10*3/uL (ref 0.9–3.3)
nRBC: 0 % (ref 0–0)

## 2012-12-17 LAB — LACTATE DEHYDROGENASE (CC13): LDH: 180 U/L (ref 125–245)

## 2012-12-17 MED ORDER — DEXAMETHASONE SODIUM PHOSPHATE 10 MG/ML IJ SOLN
20.0000 mg | Freq: Once | INTRAMUSCULAR | Status: AC
  Administered 2012-12-17: 20 mg via INTRAVENOUS

## 2012-12-17 MED ORDER — SODIUM CHLORIDE 0.9 % IV SOLN
375.0000 mg/m2 | Freq: Once | INTRAVENOUS | Status: AC
  Administered 2012-12-17: 600 mg via INTRAVENOUS
  Filled 2012-12-17: qty 60

## 2012-12-17 MED ORDER — ONDANSETRON 8 MG/50ML IVPB (CHCC)
8.0000 mg | Freq: Once | INTRAVENOUS | Status: AC
  Administered 2012-12-17: 8 mg via INTRAVENOUS

## 2012-12-17 MED ORDER — ACETAMINOPHEN 325 MG PO TABS
650.0000 mg | ORAL_TABLET | Freq: Once | ORAL | Status: AC
  Administered 2012-12-17: 650 mg via ORAL

## 2012-12-17 MED ORDER — DIPHENHYDRAMINE HCL 25 MG PO CAPS
25.0000 mg | ORAL_CAPSULE | Freq: Once | ORAL | Status: AC
  Administered 2012-12-17: 25 mg via ORAL

## 2012-12-17 MED ORDER — FAMOTIDINE IN NACL 20-0.9 MG/50ML-% IV SOLN
20.0000 mg | Freq: Once | INTRAVENOUS | Status: AC
  Administered 2012-12-17: 20 mg via INTRAVENOUS

## 2012-12-17 MED ORDER — SODIUM CHLORIDE 0.9 % IV SOLN
Freq: Once | INTRAVENOUS | Status: AC
  Administered 2012-12-17: 12:00:00 via INTRAVENOUS

## 2012-12-17 NOTE — Progress Notes (Signed)
This office note has been dictated.  #[id number]

## 2012-12-17 NOTE — Progress Notes (Signed)
CC:   Daniel Gosling, MD Barbaraann Share, MD,FCCP Daniel [name], M.D. Daniel Maroon, MD  PROBLEM LIST:  1. 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 Core needle biopsy on  04/21/2008 showed coexpression of CD5 and CD20, also CD19, CD21,  and CD22. CD10 was negative. The patient had involvement of the  peripheral blood and bone marrow when he was evaluated at [facility] in early June 2009. The  patient initially was treated with Rituxan and fludarabine, 4  cycles from 05/31/2008 through 08/22/2008.  The patient had an excellent response to his induction treatment with  Rituxan and fludarabine. Beginning in the fall of 2009, the patient was  maintained on Rituxan every 3 months. However, a CT scan of the abdomen  and pelvis carried out without IV contrast on 05/12/2012 showed mild  progression of mesenteric adenopathy when compared with the CT scan of  the abdomen and pelvis carried out on 04/15/2011.  On 05/19/2012,  the patient began receiving Rituxan on a monthly basis. It should be noted that Daniel [name] underwent hepatitis screening on  10/18/2010. This consisted of hepatitis B surface antibody, hepatitis B  surface antigen, hepatitis B core antibody total, and hepatitis core  antibody IgM. All of these were negative.  The patient had been receiving monthly Rituxan since 05/19/2012.  CT scan of the abdomen and pelvis carried out on 11/13/2012 showed no significant changes, possibly even some improvement.  Starting 12/18/2011 the patient will receive Rituxan every 2 months.  2. Chronic cough possibly related to GERD.  3. Achalasia.  4. COPD.  5. History of kidney stones.  6. History of elevated PSA.  7. BPH.  8. Hearing impairment.  9. Sun related damage of skin.  10. Diverticulosis.  11. Multiple colonic polyps.  12. Macular degeneration, under the care of Dr. Mateo Flow.  13. Lithotripsy carried out on an obstructing distal left ureteral  stone on 05/18/2012 by Dr. Marcine Matar.   MEDICATIONS:  Will be reviewed and recorded. Current Outpatient Prescriptions  Medication Sig Dispense Refill  . finasteride (PROSCAR) 5 MG tablet Take 5 mg by mouth daily with breakfast.       . fish oil-omega-3 fatty acids 1000 MG capsule Take 1 g by mouth daily with breakfast.       . furosemide (LASIX) 20 MG tablet Take 20 mg by mouth daily.      Marland Kitchen glucosamine-chondroitin 500-400 MG tablet Take 1 tablet by mouth daily.        . Hypromellose (GENTEAL OP) Place 1 drop into both eyes as needed. Dry eyes      . lansoprazole (PREVACID) 30 MG capsule Take 30 mg by mouth daily.        . Multiple Vitamins-Minerals (ICAPS MV PO) Take 1 tablet by mouth daily.       CURRENT CHEMOTHERAPY PROGRAM:  Rituxan 375 mg/meter squared, equal to 600 mg IV, had been administered from 05/19/2012 through 12/17/2012. Starting 12/17/2012 the patient will receive Rituxan 600 mg IV every 2 Months.   IMMUNIZATIONS: Pneumovax was given in the late 1990s.  The patient's last flu shot was on 09/19/2011. Patient was urged to obtain a flu shot from his primary physician.    SMOKING HISTORY: Patient has been a nonsmoker except for a pipe in the past.   HISTORY:  I saw Daniel [name] today for followup of his small lymphocytic B-cell non-Hodgkin's lymphoma,  presenting with a large abdominal mass involving the mesentery, dating back to May 2009.  The patient had stage IV disease with peripheral blood and bone marrow involvement.  Daniel [name] was last seen by [place] on 11/19/2012, and prior to that on 10/20/2012. There has been no significant change in his condition.  He continues to have cough which is productive of yellowish sputum.  He tolerates his treatments with Rituxan well.  There has been no change in his condition.  PHYSICAL EXAMINATION:  Vital Signs:  On physical exam there is  little change.  Daniel [name] is 79 years old.  Weight is 137.8 pounds, height 5 feet 5 inches, body surface area 1.69 meters squared.  Blood pressure 133/62.  Other vital signs are normal.  HEENT:  There is no scleral icterus.  Mouth and pharynx are benign.  Lymph:  There is no peripheral adenopathy palpable.  No axillary or inguinal adenopathy.  Lungs:  He has inspiratory and expiratory creaking rhonchi on the right.  The left lung is fairly clear.  Cardiac exam:  Regular rhythm, without murmur or rub.  Lines:  The patient does not have a Port-A-Cath or central catheter.  Abdomen:  Unchanged, with no palpable masses or organomegaly. He has fairly large supraumbilical and umbilical hernias and a possible diastasis recti.  No tenderness.  He has 1 to 2+ edema of both ankles. Neurologic exam:  Nonfocal.  He is not using a cane today.  LABORATORY DATA:  Today white count is 7.6, ANC 4.9, hemoglobin 13.3, hematocrit 41.9, platelets 187,000.  Chemistries today are pending. Chemistries from 11/19/2012 were notable for an albumin of 3.2, up from 2.8 on 10/22/2012, and up from 2.6 on 10/20/2012.  LDH was 251, which was marginally increased.  IMAGING STUDIES:  1. CT scan of the abdomen and pelvis with IV contrast from 03/04/2010 showed central mesenteric adenopathy. The largest left anterior mesenteric lymph node measures 2.5 x 1.2 cm, felt to be stable in size compared with the prior exam of 03/22/2009. No new adenopathy was noted. There was marked enlargement of the prostate gland with indentation of the urinary bladder base. There is a diverticulum in the right lateral aspect of the urinary bladder, felt to be stable. There was a stable compression fracture of T12 vertebral body. There was patchy airspace disease in the right lower lobe  posteriorly, felt to be due to either infection or inflammatory process.  2. Chest x-ray, 2 view, from 02/15/2011 showed stable chronic bibasilar interstitial disease  compared with the chest x-ray of 03/04/2010. No acute findings.  3. CT of the abdomen and pelvis with IV contrast from 04/15/2011 showed progression of peribronchial interstitial thickening within the left and right lower lobes. There was mild interval increase in adenopathy within the central small bowel mesentery. There was increased hydroureter and mild hydronephrosis on the right. A lymph node in the left abdomen measures 18 x 29 mm, similar to 15 x 27 mm on the prior study of 03/04/2010. It was felt that there might be more lymph nodes notable within the mesentery. In the pelvis, there were no enlarged lymph nodes. Again, there was marked enlargement of the prostate gland.  4. CT scan of abdomen and pelvis without IV contrast on 05/12/2012 showed progression of mesenteric adenopathy. Specifically an index node now  measures 4.5 x 2.4 cm on image 24 versus 2.4 x 1.3 cm on the prior CT scan of 04/15/2011. There were scattered colonic diverticula. There was no retrocrural or  retroperitoneal adenopathy. There was moderate to marked enlargement of the prostate with some mild bladder wall thickening and a right-sided bladder diverticulum. There was fat-containing ventral abdominal wall hernia. There was development of moderate left-sided hydroureteronephrosis secondary to a distal left ureteric stone. There was similar right-sided hydroureteronephrosis, felt to be due to prostatic enlargement and bladder outlet obstruction. There was improved appearance at the lung bases. T12 compression deformity was unchanged and there was evidence of prior cholecystectomy. There was a tiny hiatal hernia. 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 Heart size was normal without pericardial or pleural effusion.  5. One-view abdomen on 05/18/2012 showed a 1 cm stone in the distal aspect of the left ureter, felt to be unchanged compared with the abdominal CT scan of 05/12/2012.  6. One-view abdomen on 08/03/2012  showed possible distal left ureteral stone measuring about 6 mm along the left lateral margin of the lower sacrum. There are numerous phleboliths seen in the anatomic pelvis bilaterally, making evaluation for a distal left ureteral stone difficult.  7. Chest x-ray 2 view on 11/13/2012 showed no acute finding. Chronic interstitial change appears stable compared to prior exam from 03/04/2010.  8. CT scan of the abdomen and pelvis with IV contrast On 11/13/2012 showed chronic lower lobe bronchiectasis and peribronchial disease. Stable left-sided mesenteric lymphadenopathy. Stable right-sided hydroureteronephrosis. No obstructing ureteral calculi. Stable bladder diverticulum and enlarged prostate gland. No acute abdominal/pelvic findings.     IMPRESSION AND PLAN:  Daniel [name] clinical condition is stable.  I was encouraged by the improvement of his CT scans carried out on 11/13/2012 compared with the prior CT scan of 05/12/2012.  For that reason we will try to increase the interval between treatments from monthly to every 2 months.  As noted above, the patient has been receiving monthly Rituxan treatments since 05/19/2012.  We will try to treat him every 2 months at this point.  Accordingly, we will have Daniel [name] come back around 02/11/2013, at which time we will check CBC, chemistries.  He can be seen by Norina Buzzard at that time.  He will be due for another dose of Rituxan.  We will probably repeat his CT scans sometime toward the end of the year.    ______________________________ Samul Dada, M.D. DSM/MEDQ  D:  12/17/2012  T:  12/17/2012  Job:  [id number]

## 2012-12-17 NOTE — Patient Instructions (Signed)
[facility] Discharge Instructions for Patients Receiving Chemotherapy  Today you received Rituxan     If you develop nausea and vomiting that is not controlled by your nausea medication, call the clinic. If it is after clinic hours your family physician or the after hours number for the clinic or go to the Emergency Department.   BELOW ARE SYMPTOMS THAT SHOULD BE REPORTED IMMEDIATELY:  *FEVER GREATER THAN 100.5 F  *CHILLS WITH OR WITHOUT FEVER  NAUSEA AND VOMITING THAT IS NOT CONTROLLED WITH YOUR NAUSEA MEDICATION  *UNUSUAL SHORTNESS OF BREATH  *UNUSUAL BRUISING OR BLEEDING  TENDERNESS IN MOUTH AND THROAT WITH OR WITHOUT PRESENCE OF ULCERS  *URINARY PROBLEMS  *BOWEL PROBLEMS  UNUSUAL RASH Items with * indicate a potential emergency and should be followed up as soon as possible.  . Feel free to call the clinic you have any questions or concerns. The clinic phone number is [phone].   I have been informed and understand all the instructions given to me. I know to contact the clinic, my physician, or go to the Emergency Department if any problems should occur. I do not have any questions at this time, but understand that I may call the clinic during office hours   should I have any questions or need assistance in obtaining follow up care.    __________________________________________  _____________  __________ Signature of Patient or Authorized Representative            Date                   Time    __________________________________________ Nurse's Signature

## 2012-12-17 NOTE — Progress Notes (Signed)
Rituxan not started until 1400 (chemotherapy bag not unclamped).  Started at 1400 at rate of 100mg /hr  Rate of 36ml/hr  Vol of 26ml.  1430.  Pt. States he feels fine.  Rate increased to 200mg /hr   Rate of 168ml/hr   Vol of 53ml. 1504   Pt. Denies any problems  Rate increased to 300mg /hr  Rate of 167ml/hr  Vol of 79ml 1540  Pt. States he feels fine.  VS stable  Rate increased to max of 400mg /hr  Rate of 277ml/hr  Vol of 105. 1630  rituxan comleted with no reaction noted

## 2012-12-17 NOTE — Telephone Encounter (Signed)
Gave pt appt for march 2014 lab, and ML , emailed [place] regarding chemo

## 2012-12-24 ENCOUNTER — Telehealth: Payer: Self-pay | Admitting: Oncology

## 2012-12-24 NOTE — Telephone Encounter (Signed)
Talked to patient he has calendar appt for March 2014 lab,MD and chemo

## 2013-02-04 ENCOUNTER — Other Ambulatory Visit: Payer: Self-pay | Admitting: Internal Medicine

## 2013-02-04 DIAGNOSIS — M79662 Pain in left lower leg: Secondary | ICD-10-CM

## 2013-02-04 DIAGNOSIS — M7989 Other specified soft tissue disorders: Secondary | ICD-10-CM

## 2013-02-05 ENCOUNTER — Ambulatory Visit
Admission: RE | Admit: 2013-02-05 | Discharge: 2013-02-05 | Disposition: A | Discharge status: Home / Self Care | Payer: Medicare Other | Source: Ambulatory Visit | Attending: Internal Medicine | Admitting: Internal Medicine

## 2013-02-05 DIAGNOSIS — M79662 Pain in left lower leg: Secondary | ICD-10-CM

## 2013-02-05 DIAGNOSIS — M7989 Other specified soft tissue disorders: Secondary | ICD-10-CM

## 2013-02-09 ENCOUNTER — Telehealth: Payer: Self-pay | Admitting: Oncology

## 2013-02-09 NOTE — Telephone Encounter (Signed)
Pt 3/6 appt moved from [facility] to [facility]. S/w pt he is aware of new appt time for 3/6 @ 11:30am.

## 2013-02-11 ENCOUNTER — Telehealth: Payer: Self-pay | Admitting: *Deleted

## 2013-02-11 ENCOUNTER — Telehealth: Payer: Self-pay | Admitting: Oncology

## 2013-02-11 ENCOUNTER — Encounter: Payer: Self-pay | Admitting: Oncology

## 2013-02-11 ENCOUNTER — Ambulatory Visit (HOSPITAL_BASED_OUTPATIENT_CLINIC_OR_DEPARTMENT_OTHER): Payer: Medicare Other

## 2013-02-11 ENCOUNTER — Other Ambulatory Visit (HOSPITAL_BASED_OUTPATIENT_CLINIC_OR_DEPARTMENT_OTHER): Payer: Medicare Other | Admitting: Lab

## 2013-02-11 ENCOUNTER — Ambulatory Visit (HOSPITAL_BASED_OUTPATIENT_CLINIC_OR_DEPARTMENT_OTHER): Payer: Medicare Other | Admitting: Oncology

## 2013-02-11 VITALS — BP 129/61 | HR 78 | Temp 97.6°F | Resp 20 | Ht 65.0 in | Wt 139.8 lb

## 2013-02-11 VITALS — BP 106/55 | HR 63 | Temp 97.3°F | Resp 20

## 2013-02-11 DIAGNOSIS — C8300 Small cell B-cell lymphoma, unspecified site: Secondary | ICD-10-CM

## 2013-02-11 DIAGNOSIS — C8583 Other specified types of non-Hodgkin lymphoma, intra-abdominal lymph nodes: Secondary | ICD-10-CM

## 2013-02-11 DIAGNOSIS — Z5112 Encounter for antineoplastic immunotherapy: Secondary | ICD-10-CM

## 2013-02-11 LAB — CBC WITH DIFFERENTIAL/PLATELET
Basophils Absolute: 0 10*3/uL (ref 0.0–0.1)
Eosinophils Absolute: 0.1 10*3/uL (ref 0.0–0.5)
HCT: 43.8 % (ref 38.4–49.9)
HGB: 14.1 g/dL (ref 13.0–17.1)
MONO#: 1.1 10*3/uL — ABNORMAL HIGH (ref 0.1–0.9)
NEUT%: 70.6 % (ref 39.0–75.0)
WBC: 9.1 10*3/uL (ref 4.0–10.3)
lymph#: 1.4 10*3/uL (ref 0.9–3.3)

## 2013-02-11 LAB — COMPREHENSIVE METABOLIC PANEL (CC13)
AST: 15 U/L (ref 5–34)
Albumin: 2.8 g/dL — ABNORMAL LOW (ref 3.5–5.0)
BUN: 20.8 mg/dL (ref 7.0–26.0)
CO2: 27 mEq/L (ref 22–29)
Calcium: 9.2 mg/dL (ref 8.4–10.4)
Chloride: 105 mEq/L (ref 98–107)
Glucose: 99 mg/dl (ref 70–99)
Potassium: 4.2 mEq/L (ref 3.5–5.1)

## 2013-02-11 MED ORDER — DIPHENHYDRAMINE HCL 25 MG PO CAPS
25.0000 mg | ORAL_CAPSULE | Freq: Once | ORAL | Status: AC
  Administered 2013-02-11: 25 mg via ORAL

## 2013-02-11 MED ORDER — ACETAMINOPHEN 325 MG PO TABS
650.0000 mg | ORAL_TABLET | Freq: Once | ORAL | Status: AC
  Administered 2013-02-11: 650 mg via ORAL

## 2013-02-11 MED ORDER — SODIUM CHLORIDE 0.9 % IV SOLN
375.0000 mg/m2 | Freq: Once | INTRAVENOUS | Status: AC
  Administered 2013-02-11: 600 mg via INTRAVENOUS
  Filled 2013-02-11: qty 60

## 2013-02-11 MED ORDER — DEXAMETHASONE SODIUM PHOSPHATE 10 MG/ML IJ SOLN
20.0000 mg | Freq: Once | INTRAMUSCULAR | Status: AC
  Administered 2013-02-11: 20 mg via INTRAVENOUS

## 2013-02-11 MED ORDER — FAMOTIDINE IN NACL 20-0.9 MG/50ML-% IV SOLN
20.0000 mg | Freq: Once | INTRAVENOUS | Status: AC
  Administered 2013-02-11: 20 mg via INTRAVENOUS

## 2013-02-11 MED ORDER — SODIUM CHLORIDE 0.9 % IV SOLN
Freq: Once | INTRAVENOUS | Status: AC
  Administered 2013-02-11: 14:00:00 via INTRAVENOUS

## 2013-02-11 MED ORDER — ONDANSETRON 8 MG/50ML IVPB (CHCC)
8.0000 mg | Freq: Once | INTRAVENOUS | Status: AC
  Administered 2013-02-11: 8 mg via INTRAVENOUS

## 2013-02-11 NOTE — Patient Instructions (Addendum)
[facility] Discharge Instructions for Patients Receiving Chemotherapy  Today you received the following chemotherapy agents Rituxan.  To help prevent nausea and vomiting after your treatment, we encourage you to take your nausea medication as prescribed.    If you develop nausea and vomiting that is not controlled by your nausea medication, call the clinic. If it is after clinic hours your family physician or the after hours number for the clinic or go to the Emergency Department.   BELOW ARE SYMPTOMS THAT SHOULD BE REPORTED IMMEDIATELY:  *FEVER GREATER THAN 100.5 F  *CHILLS WITH OR WITHOUT FEVER  NAUSEA AND VOMITING THAT IS NOT CONTROLLED WITH YOUR NAUSEA MEDICATION  *UNUSUAL SHORTNESS OF BREATH  *UNUSUAL BRUISING OR BLEEDING  TENDERNESS IN MOUTH AND THROAT WITH OR WITHOUT PRESENCE OF ULCERS  *URINARY PROBLEMS  *BOWEL PROBLEMS  UNUSUAL RASH Items with * indicate a potential emergency and should be followed up as soon as possible.  Please let the nurse know about any problems that you may have experienced. Feel free to call the clinic you have any questions or concerns. The clinic phone number is [phone].   I have been informed and understand all the instructions given to me. I know to contact the clinic, my physician, or go to the Emergency Department if any problems should occur. I do not have any questions at this time, but understand that I may call the clinic during office hours   should I have any questions or need assistance in obtaining follow up care.    __________________________________________  _____________  __________ Signature of Patient or Authorized Representative            Date                   Time    __________________________________________ Nurse's Signature

## 2013-02-11 NOTE — Progress Notes (Signed)
This office note has been dictated.  #[id number]

## 2013-02-11 NOTE — Telephone Encounter (Signed)
Per staff phone call and POF I have schedueld appts.  JMW  

## 2013-02-12 NOTE — Progress Notes (Signed)
CC:   Daniel Gosling, MD Daniel Share, MD,FCCP Daniel [name], M.D. Daniel Maroon, MD  PROBLEM LIST:  1. 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 Core needle biopsy on  04/21/2008 showed coexpression of CD5 and CD20, also CD19, CD21,  and CD22. CD10 was negative. The patient had involvement of the  peripheral blood and bone marrow when he was evaluated at [facility] in early June 2009. The  patient initially was treated with Rituxan and fludarabine, 4  cycles from 05/31/2008 through 08/22/2008.  The patient had an excellent response to his induction treatment with  Rituxan and fludarabine. Beginning in the fall of 2009, the patient was  maintained on Rituxan [name] 3 months. However, a CT scan of the abdomen  and pelvis carried out without IV contrast on 05/12/2012 showed mild  progression of mesenteric adenopathy when compared with the CT scan of  the abdomen and pelvis carried out on 04/15/2011. On 05/19/2012,  the patient began receiving Rituxan on a monthly basis.  It should be noted that Daniel [name] underwent hepatitis screening on  10/18/2010. This consisted of hepatitis B surface antibody, hepatitis B  surface antigen, hepatitis B core antibody total, and hepatitis core  antibody IgM. All of these were negative.  The patient had been receiving monthly Rituxan since 05/19/2012. CT  scan of the abdomen and pelvis carried out on 11/13/2012 showed no  significant changes, possibly even some improvement. Rituxan is now  being given [name] 2 months as of 12/17/2012.   2. Chronic cough possibly related to GERD.  3. Achalasia.  4. COPD.  5. History of kidney stones.  6. History of elevated PSA.  7. BPH.  8. Hearing impairment.  9. Sun related damage of skin.  10. Diverticulosis.  11. Multiple colonic polyps.  12. Macular degeneration, under the care of Dr. Mateo Flow.  13.  Lithotripsy carried out on an obstructing distal left ureteral  stone on 05/18/2012 by Dr. Marcine Matar.   MEDICATIONS:  Reviewed and recorded. Current Outpatient Prescriptions  Medication Sig Dispense Refill  . finasteride (PROSCAR) 5 MG tablet Take 5 mg by mouth daily with breakfast.       . fish oil-omega-3 fatty acids 1000 MG capsule Take 1 g by mouth daily with breakfast.       . furosemide (LASIX) 20 MG tablet Take 20 mg by mouth daily.      Marland Kitchen glucosamine-chondroitin 500-400 MG tablet Take 1 tablet by mouth daily.        . Hypromellose (GENTEAL OP) Place 1 drop into both eyes as needed. Dry eyes      . lansoprazole (PREVACID) 30 MG capsule Take 30 mg by mouth daily.        . Multiple Vitamins-Minerals (ICAPS MV PO) Take 1 tablet by mouth daily.       No current facility-administered medications for this visit.    CURRENT CHEMOTHERAPY PROGRAM: Rituxan 375 mg/meter squared, equal to  600 mg IV, had been administered from 05/19/2012 through 12/17/2012.  Starting 12/17/2012, the patient is receiving Rituxan 600 mg IV [name] 2  months.   IMMUNIZATIONS:  Pneumovax was given in the late 1990s.  The patient's last flu shot was on 09/19/2011. Patient was urged to obtain a flu shot from his primary physician.    SMOKING HISTORY: Patient has been a nonsmoker except for a pipe in the past.    HISTORY:  Daniel [name] was seen today for followup of his small lymphocytic B-cell non-Hodgkin lymphoma presenting with a large abdominal mass involving the mesentery.  Diagnosis dates back to May 2009 when the patient had stage IV disease on the basis of peripheral blood and bone marrow involvement.  Daniel [name] was last seen by [place] on 12/17/2012, at which time he received Rituxan 600 mg and Decadron 20 mg IV.  He tolerates his treatments well.  There have been no major changes in Daniel [name] condition.  Apparently, he was noted to have some discomfort in his left calf and underwent a Doppler  study of his left lower extremity which was negative for superficial or deep venous thrombosis.  The patient is really without any complaints today.  He continues to get along quite well and is quite independent.  There have been no significant changes in his condition or symptoms.  He still has a somewhat productive cough.  At times this seems to be improved.  PHYSICAL EXAMINATION:  General:  Daniel [name] shows no obvious changes. Weight is stable at 139 pounds 12.8 ounces, height 5 feet 5 inches, body surface area 1.71 sq m.  Vital Signs:  Blood pressure 129/61.  Other vital signs are normal.  O2 saturation on room air at rest was 96%.  HEENT: There is no scleral icterus. Mouth and pharynx are benign. Lymph: There is no peripheral adenopathy palpable. No axillary or inguinal adenopathy. Lungs: He has inspiratory and expiratory creaking rhonchi at both bases, right  greater than left. Cardiac exam: Regular rhythm, without murmur or  rub. Lines: The patient does not have a Port-A-Cath or central  catheter. Abdomen: Unchanged, with no palpable masses or organomegaly.  He has fairly large supraumbilical and umbilical hernias and possibly  a diastasis recti. No tenderness. He has 1 to 2+ edema of both lower legs  Neurologic exam: Nonfocal. He is not using a cane today.   LABORATORY DATA:  Today, white count 9.1, ANC 6.4, hemoglobin 14.1, hematocrit 43.8, platelets 177,000.  Chemistries today are pending. Chemistries from 12/17/2012 were notable for an albumin of 3.0.  BUN was 20, creatinine 1.1, and LDH 180.   IMAGING STUDIES:  1. CT scan of the abdomen and pelvis with IV contrast from 03/04/2010 showed central mesenteric adenopathy. The largest left anterior mesenteric lymph node measures 2.5 x 1.2 cm, felt to be stable in size compared with the prior exam of 03/22/2009. No new adenopathy was noted. There was marked enlargement of the prostate gland with indentation of the urinary bladder base.  There is a diverticulum in the right lateral aspect of the urinary bladder, felt to be stable. There was a stable compression fracture of T12 vertebral body. There was patchy airspace disease in the right lower lobe  posteriorly, felt to be due to either infection or inflammatory process.  2. Chest x-ray, 2 view, from 02/15/2011 showed stable chronic bibasilar interstitial disease compared with the chest x-ray of 03/04/2010. No acute findings.  3. CT of the abdomen and pelvis with IV contrast from 04/15/2011 showed progression of peribronchial interstitial thickening within the left and right lower lobes. There was mild interval increase in adenopathy within the central small bowel mesentery. There was increased hydroureter and mild hydronephrosis on the right. A lymph node in the left abdomen measures 18 x 29 mm, similar to 15 x 27 mm on the prior study of 03/04/2010. It was felt that there might be more lymph nodes notable within the mesentery. In  the pelvis, there were no enlarged lymph nodes. Again, there was marked enlargement of the prostate gland.  4. CT scan of abdomen and pelvis without IV contrast on 05/12/2012 showed progression of mesenteric adenopathy. Specifically an index node now  measures 4.5 x 2.4 cm on image 24 versus 2.4 x 1.3 cm on the prior CT scan of 04/15/2011. There were scattered colonic diverticula. There was no retrocrural or retroperitoneal adenopathy. There was moderate to marked enlargement of the prostate with some mild bladder wall thickening and a right-sided bladder diverticulum. There was fat-containing ventral abdominal wall hernia. There was development of moderate left-sided hydroureteronephrosis secondary to a distal left ureteric stone. There was similar right-sided hydroureteronephrosis, felt to be due to prostatic enlargement and bladder outlet obstruction. There was improved appearance at the lung bases. T12 compression deformity was unchanged and there was evidence  of prior cholecystectomy. There was a tiny hiatal hernia. 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 Heart size was normal without pericardial or pleural effusion.  5. One-view abdomen on 05/18/2012 showed a 1 cm stone in the distal aspect of the left ureter, felt to be unchanged compared with the abdominal CT scan of 05/12/2012.  6. One-view abdomen on 08/03/2012 showed possible distal left ureteral stone measuring about 6 mm along the left lateral margin of the lower sacrum. There are numerous phleboliths seen in the anatomic pelvis bilaterally, making evaluation for a distal left ureteral stone difficult.  7. Chest x-ray 2 view on 11/13/2012 showed no acute finding. Chronic interstitial change appears stable compared to prior exam from 03/04/2010.  8. CT scan of the abdomen and pelvis with IV contrast On 11/13/2012 showed chronic lower lobe bronchiectasis and peribronchial disease. Stable left-sided mesenteric lymphadenopathy. Stable right-sided hydroureteronephrosis. No obstructing ureteral calculi. Stable bladder diverticulum and enlarged prostate gland. No acute abdominal/pelvic findings. 9. Left lower extremity venous duplex ultrasound carried out on 02/05/2013 showed no evidence of left lower extremity deep venous thrombosis. There was no evidence of superficial thrombophlebitis or abnormal fluid collection.   IMPRESSION AND PLAN:  Daniel [name] condition remains stable.  His last CT scans of abdomen and pelvis were carried out on 11/13/2012 and showed no major changes compared with the prior study of 07/02/2012.  We will go ahead with another treatment consisting of Rituxan 600 mg and Decadron 20 mg IV.  Daniel [name] will return in 2 months, around May 8th, at which time we will check CBC and chemistries.  He will be due for another treatment at that time.  We will probably repeat CT scans sometime toward the end of the year.    ______________________________ Samul Dada,  M.D. DSM/MEDQ  D:  02/11/2013  T:  02/12/2013  Job:  [id number]

## 2013-04-19 ENCOUNTER — Encounter: Payer: Self-pay | Admitting: Oncology

## 2013-04-19 ENCOUNTER — Ambulatory Visit (HOSPITAL_BASED_OUTPATIENT_CLINIC_OR_DEPARTMENT_OTHER): Payer: Medicare Other

## 2013-04-19 ENCOUNTER — Ambulatory Visit (HOSPITAL_BASED_OUTPATIENT_CLINIC_OR_DEPARTMENT_OTHER): Payer: Medicare Other | Admitting: Oncology

## 2013-04-19 ENCOUNTER — Telehealth: Payer: Self-pay | Admitting: Oncology

## 2013-04-19 ENCOUNTER — Other Ambulatory Visit (HOSPITAL_BASED_OUTPATIENT_CLINIC_OR_DEPARTMENT_OTHER): Payer: Medicare Other | Admitting: Lab

## 2013-04-19 VITALS — BP 98/52 | HR 68 | Temp 97.0°F | Resp 20

## 2013-04-19 VITALS — BP 114/52 | HR 74 | Temp 98.1°F | Resp 19 | Ht 65.0 in | Wt 138.2 lb

## 2013-04-19 DIAGNOSIS — C8583 Other specified types of non-Hodgkin lymphoma, intra-abdominal lymph nodes: Secondary | ICD-10-CM

## 2013-04-19 DIAGNOSIS — C8300 Small cell B-cell lymphoma, unspecified site: Secondary | ICD-10-CM

## 2013-04-19 DIAGNOSIS — R05 Cough: Secondary | ICD-10-CM

## 2013-04-19 DIAGNOSIS — Z5112 Encounter for antineoplastic immunotherapy: Secondary | ICD-10-CM

## 2013-04-19 LAB — COMPREHENSIVE METABOLIC PANEL (CC13)
AST: 21 U/L (ref 5–34)
Alkaline Phosphatase: 113 U/L (ref 40–150)
Glucose: 101 mg/dl — ABNORMAL HIGH (ref 70–99)
Sodium: 141 mEq/L (ref 136–145)
Total Bilirubin: 0.57 mg/dL (ref 0.20–1.20)
Total Protein: 6.2 g/dL — ABNORMAL LOW (ref 6.4–8.3)

## 2013-04-19 LAB — CBC WITH DIFFERENTIAL/PLATELET
BASO%: 0.3 % (ref 0.0–2.0)
EOS%: 4.3 % (ref 0.0–7.0)
HCT: 41.5 % (ref 38.4–49.9)
MCHC: 32.3 g/dL (ref 32.0–36.0)
MONO#: 1.1 10*3/uL — ABNORMAL HIGH (ref 0.1–0.9)
RBC: 4.69 10*6/uL (ref 4.20–5.82)
RDW: 15.1 % — ABNORMAL HIGH (ref 11.0–14.6)
WBC: 9.3 10*3/uL (ref 4.0–10.3)
lymph#: 2.1 10*3/uL (ref 0.9–3.3)
nRBC: 0 % (ref 0–0)

## 2013-04-19 LAB — LACTATE DEHYDROGENASE (CC13): LDH: 187 U/L (ref 125–245)

## 2013-04-19 MED ORDER — ONDANSETRON 8 MG/50ML IVPB (CHCC)
8.0000 mg | Freq: Once | INTRAVENOUS | Status: AC
  Administered 2013-04-19: 8 mg via INTRAVENOUS

## 2013-04-19 MED ORDER — DEXAMETHASONE SODIUM PHOSPHATE 10 MG/ML IJ SOLN
20.0000 mg | Freq: Once | INTRAMUSCULAR | Status: AC
  Administered 2013-04-19: 20 mg via INTRAVENOUS

## 2013-04-19 MED ORDER — SODIUM CHLORIDE 0.9 % IV SOLN
Freq: Once | INTRAVENOUS | Status: AC
  Administered 2013-04-19: 11:00:00 via INTRAVENOUS

## 2013-04-19 MED ORDER — DIPHENHYDRAMINE HCL 25 MG PO CAPS
25.0000 mg | ORAL_CAPSULE | Freq: Once | ORAL | Status: AC
  Administered 2013-04-19: 25 mg via ORAL

## 2013-04-19 MED ORDER — ACETAMINOPHEN 325 MG PO TABS
650.0000 mg | ORAL_TABLET | Freq: Once | ORAL | Status: AC
  Administered 2013-04-19: 650 mg via ORAL

## 2013-04-19 MED ORDER — FAMOTIDINE IN NACL 20-0.9 MG/50ML-% IV SOLN
20.0000 mg | Freq: Once | INTRAVENOUS | Status: AC
  Administered 2013-04-19: 20 mg via INTRAVENOUS

## 2013-04-19 MED ORDER — SODIUM CHLORIDE 0.9 % IV SOLN
375.0000 mg/m2 | Freq: Once | INTRAVENOUS | Status: AC
  Administered 2013-04-19: 600 mg via INTRAVENOUS
  Filled 2013-04-19: qty 60

## 2013-04-19 NOTE — Progress Notes (Signed)
This office note has been dictated.  #[id number]

## 2013-04-19 NOTE — Telephone Encounter (Signed)
gv pt appt schedule for July.  

## 2013-04-19 NOTE — Patient Instructions (Addendum)
[facility] Discharge Instructions for Patients Receiving Chemotherapy  Today you received the following chemotherapy agents: Rituxan   To help prevent nausea and vomiting after your treatment, we encourage you to take your nausea medication.  Take it as often as prescribed.     If you develop nausea and vomiting that is not controlled by your nausea medication, call the clinic. If it is after clinic hours your family physician or the after hours number for the clinic or go to the Emergency Department.   BELOW ARE SYMPTOMS THAT SHOULD BE REPORTED IMMEDIATELY:  *FEVER GREATER THAN 100.5 F  *CHILLS WITH OR WITHOUT FEVER  NAUSEA AND VOMITING THAT IS NOT CONTROLLED WITH YOUR NAUSEA MEDICATION  *UNUSUAL SHORTNESS OF BREATH  *UNUSUAL BRUISING OR BLEEDING  TENDERNESS IN MOUTH AND THROAT WITH OR WITHOUT PRESENCE OF ULCERS  *URINARY PROBLEMS  *BOWEL PROBLEMS  UNUSUAL RASH Items with * indicate a potential emergency and should be followed up as soon as possible.  Feel free to call the clinic you have any questions or concerns. The clinic phone number is [phone].   I have been informed and understand all the instructions given to me. I know to contact the clinic, my physician, or go to the Emergency Department if any problems should occur. I do not have any questions at this time, but understand that I may call the clinic during office hours   should I have any questions or need assistance in obtaining follow up care.    __________________________________________  _____________  __________ Signature of Patient or Authorized Representative            Date                   Time    __________________________________________ Nurse's Signature

## 2013-04-20 NOTE — Progress Notes (Signed)
CC:   Daniel Gosling, MD Barbaraann Share, MD,FCCP Bertram Millard. Dahlstedt, M.D. Massie Maroon, MD  PROBLEM LIST:  1. 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 Core needle biopsy on  04/21/2008 showed coexpression of CD5 and CD20, also CD19, CD21,  and CD22. CD10 was negative. The patient had involvement of the  peripheral blood and bone marrow when he was evaluated at [facility] in early June 2009. The  patient initially was treated with Rituxan and fludarabine, 4  cycles from 05/31/2008 through 08/22/2008.  The patient had an excellent response to his induction treatment with  Rituxan and fludarabine. Beginning in the fall of 2009, the patient was  maintained on Rituxan every 3 months. However, a CT scan of the abdomen  and pelvis carried out without IV contrast on 05/12/2012 showed mild  progression of mesenteric adenopathy when compared with the CT scan of  the abdomen and pelvis carried out on 04/15/2011. On 05/19/2012,  the patient began receiving Rituxan on a monthly basis.  It should be noted that Daniel [name] underwent hepatitis screening on  10/18/2010. This consisted of hepatitis B surface antibody, hepatitis B  surface antigen, hepatitis B core antibody total, and hepatitis core  antibody IgM. All of these were negative.  The patient had been receiving monthly Rituxan since 05/19/2012. CT  scan of the abdomen and pelvis carried out on 11/13/2012 showed no  significant changes, possibly even some improvement. Rituxan is now  being given every 2 months as of 12/17/2012.  2. Chronic cough possibly related to GERD.  3. Achalasia.  4. COPD.  5. History of kidney stones.  6. History of elevated PSA.  7. BPH.  8. Hearing impairment.  9. Sun related damage of skin.  10. Diverticulosis.  11. Multiple colonic polyps.  12. Macular degeneration, under the care of Dr. Mateo Flow.  13.  Lithotripsy carried out on an obstructing distal left ureteral  stone on 05/18/2012 by Dr. Marcine Matar.   MEDICATIONS:  Reviewed and recorded. Current Outpatient Prescriptions  Medication Sig Dispense Refill  . finasteride (PROSCAR) 5 MG tablet Take 5 mg by mouth daily with breakfast.       . fish oil-omega-3 fatty acids 1000 MG capsule Take 1 g by mouth daily with breakfast.       . furosemide (LASIX) 20 MG tablet Take 20 mg by mouth daily.      Marland Kitchen glucosamine-chondroitin 500-400 MG tablet Take 1 tablet by mouth daily.        . Hypromellose (GENTEAL OP) Place 1 drop into both eyes as needed. Dry eyes      . lansoprazole (PREVACID) 30 MG capsule Take 30 mg by mouth daily.        . Multiple Vitamins-Minerals (ICAPS MV PO) Take 1 tablet by mouth daily.       No current facility-administered medications for this visit.    TREATMENT PROGRAM:  -Rituxan 375 mg/meter squared, equal to 600 mg IV, had been administered from 05/19/2012 through 12/17/2012. Starting 12/17/2012, the patient is receiving Rituxan 600 mg IV every 2 months.  -Decadron 20 mg IV every 2 months with Rituxan.   IMMUNIZATIONS:  -Pneumovax was given in the late 1990s.  -The patient's last flu shot was on 09/19/2011. Patient was urged to obtain a flu shot from his primary physician.   SMOKING HISTORY: Patient has been a nonsmoker except for a pipe in the  past.     HISTORY:  I saw Daniel [name] today for followup of his small lymphocytic B cell non-Hodgkin's lymphoma presenting with a large abdominal mass involving the mesentery.  Diagnosis goes back to May 2009, and the patient had stage IV disease on the basis of peripheral blood and bone marrow involvement.  Daniel [name] was last seen by [place] on 02/11/2013, at which time he received another dose of Rituxan 600 mg and Decadron 20 mg IV.  There have been no major problems with his infusions.  He tolerates these well.  The patient denies any problems over the past 2  months.  He continues to have a chronic cough occasionally productive of a thick yellowish sputum.  This problem has been ongoing for at least the last couple of years and does not appear to have changed.  The patient is without any new complaints.  He denies any pain.  PHYSICAL EXAMINATION:  He shows no changes.  He is 79 years old, fairly sharp mentally, very pleasant and friendly.  Weight is 138 pounds 3.2 ounces.  Height 5 feet 5 inches.  Body surface area 1.7 sq m.  Blood pressure 114/52.  Other vital signs are normal.  There is no scleral icterus.  Mouth and pharynx are benign.  No peripheral adenopathy palpable.  No axillary or inguinal adenopathy.  Lungs continue to reveal inspiratory and expiatory creaking rhonchi at both bases, right greater than left.  Cardiac:  Regular rhythm without murmur or rub.  The patient has some ecchymosis over his right anterior chest.  He does not have a Port-A-Cath or central catheter.  Abdomen:  Unchanged with no palpable masses or organomegaly.  He has fairly large supraumbilical and umbilical hernias as well as a possible diastasis recti.  He has 1-2+ edema of the right lower leg and 1+ left ankle edema.  Neurologic: Nonfocal.  The patient sometimes uses a cane.  He has sun-damaged skin, particularly on his arms.  He has a large cystic lesion over his central forehead, all unchanged.  LABORATORY DATA:  Today, white count 9.3, ANC 5.7, hemoglobin 13.4, hematocrit 41.5, platelets 179,000.  Chemistries, including an LDH, are pending.  Chemistries from 02/11/2013 notable for an albumin of 2.8, BUN of 21, creatinine 1.0, LDH 156.  IMAGING STUDIES:  1. CT scan of the abdomen and pelvis with IV contrast from 03/04/2010 showed central mesenteric adenopathy. The largest left anterior mesenteric lymph node measures 2.5 x 1.2 cm, felt to be stable in size compared with the prior exam of 03/22/2009. No new adenopathy was noted. There was marked enlargement  of the prostate gland with indentation of the urinary bladder base. There is a diverticulum in the right lateral aspect of the urinary bladder, felt to be stable. There was a stable compression fracture of T12 vertebral body. There was patchy airspace disease in the right lower lobe  posteriorly, felt to be due to either infection or inflammatory process.  2. Chest x-ray, 2 view, from 02/15/2011 showed stable chronic bibasilar interstitial disease compared with the chest x-ray of 03/04/2010. No acute findings.  3. CT of the abdomen and pelvis with IV contrast from 04/15/2011 showed progression of peribronchial interstitial thickening within the left and right lower lobes. There was mild interval increase in adenopathy within the central small bowel mesentery. There was increased hydroureter and mild hydronephrosis on the right. A lymph node in the left abdomen measures 18 x 29 mm, similar to 15 x 27 mm on the  prior study of 03/04/2010. It was felt that there might be more lymph nodes notable within the mesentery. In the pelvis, there were no enlarged lymph nodes. Again, there was marked enlargement of the prostate gland.  4. CT scan of abdomen and pelvis without IV contrast on 05/12/2012 showed progression of mesenteric adenopathy. Specifically an index node now  measures 4.5 x 2.4 cm on image 24 versus 2.4 x 1.3 cm on the prior CT scan of 04/15/2011. There were scattered colonic diverticula. There was no retrocrural or retroperitoneal adenopathy. There was moderate to marked enlargement of the prostate with some mild bladder wall thickening and a right-sided bladder diverticulum. There was fat-containing ventral abdominal wall hernia. There was development of moderate left-sided hydroureteronephrosis secondary to a distal left ureteric stone. There was similar right-sided hydroureteronephrosis, felt to be due to prostatic enlargement and bladder outlet obstruction. There was improved appearance at the lung  bases. T12 compression deformity was unchanged and there was evidence of prior cholecystectomy. There was a tiny hiatal hernia. 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 Heart size was normal without pericardial or pleural effusion.  5. One-view abdomen on 05/18/2012 showed a 1 cm stone in the distal aspect of the left ureter, felt to be unchanged compared with the abdominal CT scan of 05/12/2012.  6. One-view abdomen on 08/03/2012 showed possible distal left ureteral stone measuring about 6 mm along the left lateral margin of the lower sacrum. There are numerous phleboliths seen in the anatomic pelvis bilaterally, making evaluation for a distal left ureteral stone difficult.  7. Chest x-ray 2 view on 11/13/2012 showed no acute finding. Chronic interstitial change appears stable compared to prior exam from 03/04/2010.  8. CT scan of the abdomen and pelvis with IV contrast On 11/13/2012 showed chronic lower lobe bronchiectasis and peribronchial disease. Stable left-sided mesenteric lymphadenopathy. Stable right-sided hydroureteronephrosis. No obstructing ureteral calculi. Stable bladder diverticulum and enlarged prostate gland. No acute abdominal/pelvic findings.  9. Left lower extremity venous duplex ultrasound carried out on 02/05/2013  showed no evidence of left lower extremity deep venous thrombosis.  There was no evidence of superficial thrombophlebitis or abnormal fluid  collection.    IMPRESSION/PLAN:  Daniel [name] condition remained stable.  His last CT scan of the abdomen and pelvis was carried out on 11/13/2012 and prior to that on 05/12/2012.  The left-sided lymphadenopathy remains stable.  We will go ahead with another course of treatment with Rituxan 600 mg and Decadron 20 mg IV.  Daniel [name] will return in approximately 2 months, which will be around July 14th, at which time we will check CBC and chemistries, and he will be due for another course of treatment.  It is our  plan to repeat his CT scans sometime in the fall of this year.    ______________________________ Samul Dada, M.D. DSM/MEDQ  D:  04/19/2013  T:  04/20/2013  Job:  [id number]

## 2013-05-27 ENCOUNTER — Emergency Department (HOSPITAL_COMMUNITY)
Admission: EM | Admit: 2013-05-27 | Discharge: 2013-05-27 | Disposition: A | Discharge status: Home / Self Care | Payer: Medicare Other | Attending: Emergency Medicine | Admitting: Emergency Medicine

## 2013-05-27 ENCOUNTER — Emergency Department (HOSPITAL_COMMUNITY): Payer: Medicare Other

## 2013-05-27 ENCOUNTER — Other Ambulatory Visit: Payer: Self-pay

## 2013-05-27 ENCOUNTER — Encounter (HOSPITAL_COMMUNITY): Payer: Self-pay | Admitting: Unknown Physician Specialty

## 2013-05-27 DIAGNOSIS — R079 Chest pain, unspecified: Secondary | ICD-10-CM

## 2013-05-27 DIAGNOSIS — Z8669 Personal history of other diseases of the nervous system and sense organs: Secondary | ICD-10-CM | POA: Insufficient documentation

## 2013-05-27 DIAGNOSIS — N189 Chronic kidney disease, unspecified: Secondary | ICD-10-CM | POA: Insufficient documentation

## 2013-05-27 DIAGNOSIS — Z79899 Other long term (current) drug therapy: Secondary | ICD-10-CM | POA: Insufficient documentation

## 2013-05-27 DIAGNOSIS — Z87898 Personal history of other specified conditions: Secondary | ICD-10-CM | POA: Insufficient documentation

## 2013-05-27 DIAGNOSIS — Z8719 Personal history of other diseases of the digestive system: Secondary | ICD-10-CM | POA: Insufficient documentation

## 2013-05-27 DIAGNOSIS — Z87891 Personal history of nicotine dependence: Secondary | ICD-10-CM | POA: Insufficient documentation

## 2013-05-27 DIAGNOSIS — R1013 Epigastric pain: Secondary | ICD-10-CM | POA: Insufficient documentation

## 2013-05-27 DIAGNOSIS — Z87448 Personal history of other diseases of urinary system: Secondary | ICD-10-CM | POA: Insufficient documentation

## 2013-05-27 LAB — CBC
HCT: 45.8 % (ref 39.0–52.0)
Hemoglobin: 15.1 g/dL (ref 13.0–17.0)
MCH: 28.9 pg (ref 26.0–34.0)
RBC: 5.22 MIL/uL (ref 4.22–5.81)

## 2013-05-27 LAB — COMPREHENSIVE METABOLIC PANEL
ALT: 174 U/L — ABNORMAL HIGH (ref 0–53)
Alkaline Phosphatase: 151 U/L — ABNORMAL HIGH (ref 39–117)
BUN: 19 mg/dL (ref 6–23)
CO2: 30 mEq/L (ref 19–32)
Calcium: 9.4 mg/dL (ref 8.4–10.5)
GFR calc Af Amer: 64 mL/min — ABNORMAL LOW (ref >90)
GFR calc non Af Amer: 55 mL/min — ABNORMAL LOW (ref >90)
Glucose, Bld: 96 mg/dL (ref 70–99)
Potassium: 4.6 mEq/L (ref 3.5–5.1)
Sodium: 139 mEq/L (ref 135–145)

## 2013-05-27 MED ORDER — ASPIRIN 81 MG PO CHEW: 324.0000 mg | CHEWABLE_TABLET | Freq: Once | ORAL | Status: DC

## 2013-05-27 NOTE — ED Notes (Signed)
Patient arrived via [facility] from [facility] office with Chest pain that started yesterday. Describes as a dull pain, unable to rate. He states it does not change with activity. He states it is at its worse when he is lying down in bed. Denies any N/V, shortness of breath or diaphoresis. EMS administered Asprin 324 mg.

## 2013-05-27 NOTE — ED Provider Notes (Signed)
History     CSN: [id number]  Arrival date & time 05/27/13  1203   First MD Initiated Contact with Patient 05/27/13 1204      Chief Complaint  Patient presents with  . Chest Pain    (Consider location/radiation/quality/duration/timing/severity/associated sxs/prior treatment) HPI Patient presents with concern of epigastric pain.  Pain began yesterday, without clear precipitant.  Since onset symptoms have been intermittent, and are currently minimal.  There is a soreness about the epigastrium, nonradiating, moderate in severity when the sensation is most pronounced. Symptoms are worse with supine positioning. There is no concurrent dyspnea, diaphoresis, lightheadedness, syncope, exertional or pleuritic changes. Patient states that he has been in his usual state of health until this time.  Past Medical History  Diagnosis Date  . Chronic kidney disease   . NHL (non-Hodgkin's lymphoma)     nhl dx 5/09;  . HOH (hard of hearing)   . Diverticulosis   . BPH (benign prostatic hyperplasia)   . Congenital malformation of spine     T12  . Hx of hydronephrosis     Past Surgical History  Procedure Laterality Date  . Cholecystectomy    . Prostate surgery    . Eye surgery      Implants bilateral  . Inguinal hernia repair      left    Family History  Problem Relation Age of Onset  . Tuberculosis Mother   . Blindness Father     History  Substance Use Topics  . Smoking status: Former Smoker    Types: Pipe    Quit date: 05/09/1972  . Smokeless tobacco: Former Neurosurgeon  . Alcohol Use: 0.6 oz/week    1 Glasses of wine per week     Comment: Rarely      Review of Systems  Constitutional:       Per HPI, otherwise negative  HENT:       Per HPI, otherwise negative  Respiratory:       Per HPI, otherwise negative  Cardiovascular:       Per HPI, otherwise negative  Gastrointestinal: Negative for vomiting.  Endocrine:       Negative aside from HPI  Genitourinary:       Neg  aside from HPI   Musculoskeletal:       Per HPI, otherwise negative  Skin: Negative.   Neurological: Negative for syncope.    Allergies  Review of patient's allergies indicates no known allergies.  Home Medications   Current Outpatient Rx  Name  Route  Sig  Dispense  Refill  . finasteride (PROSCAR) 5 MG tablet   Oral   Take 5 mg by mouth daily with breakfast.          . fish oil-omega-3 fatty acids 1000 MG capsule   Oral   Take 1 g by mouth daily with breakfast.          . furosemide (LASIX) 20 MG tablet   Oral   Take 20 mg by mouth daily.         Marland Kitchen glucosamine-chondroitin 500-400 MG tablet   Oral   Take 1 tablet by mouth daily.           . Hypromellose (GENTEAL OP)   Both Eyes   Place 1 drop into both eyes as needed. Dry eyes         . lansoprazole (PREVACID) 30 MG capsule   Oral   Take 30 mg by mouth daily.           Marland Kitchen  Multiple Vitamins-Minerals (ICAPS MV PO)   Oral   Take 1 tablet by mouth daily.           BP 145/73  Pulse 61  Temp(Src) 97.9 F (36.6 C) (Oral)  Resp 21  Ht 5\' 5"  (1.651 m)  Wt 138 lb (62.596 kg)  BMI 22.96 kg/m2  SpO2 95%  Physical Exam  Nursing note and vitals reviewed. Constitutional: He is oriented to person, place, and time. He appears well-developed. No distress.  HENT:  Head: Normocephalic and atraumatic.  Eyes: Conjunctivae and EOM are normal.  Cardiovascular: Normal rate and regular rhythm.   Pulmonary/Chest: Effort normal. No stridor. No respiratory distress.  Abdominal: He exhibits no distension.  Musculoskeletal: He exhibits no edema.  Neurological: He is alert and oriented to person, place, and time.  Skin: Skin is warm and dry.  Psychiatric: He has a normal mood and affect.    ED Course  Procedures (including critical care time)  Labs Reviewed  CBC  COMPREHENSIVE METABOLIC PANEL  PROTIME-INR   No results found.  Date: 05/27/2013  Rate: 64  Rhythm: normal sinus rhythm  QRS Axis: right   Intervals: normal  ST/T Wave abnormalities: nonspecific T wave changes  Conduction Disutrbances:right bundle branch block  Narrative Interpretation:   Old EKG Reviewed: unchanged ABNORMAL  Update: After repeat exam, ultrasound was ordered with concerns for elevated transaminases.  Patient is watching golf, currently in no distress.  He denies any complaints.  Update: With ultrasound results, CT was ordered.  On repeat exam the patient is still watching golf, still denies any ongoing complaints.   No diagnosis found.  Cardiac: 75 sinus rhythm normal Pulse ox 99% room air normal    5:49 PM On repeat exam, and multiple prior repeat exam, the patient was in no distress.  With no new complaints, we discussed results at length.  I have paged the patient's primary care physician, in awaiting a call back.  The patient states that he can followup with his physician via telephone tomorrow.  Although there are multiple abnormalities demonstrated on CT, with his stable vital signs, largely reassuring labs, his progress for discharge with outpatient followup, though it is clear that he requires close followup.   MDM  This patient presents with epigastric discomfort.  Given the presence of pain for one day, he is nonischemic ECG, negative troponins likely reflect the absence of ongoing coronary ischemia. Patient has multiple other medical problems, including non-Hodgkin's lymphoma. The patient's evaluation, was progressive, which eventually demonstrated multiple new lesions on CT, though none of them seemingly acute.  However, with all of these findings, the patient clearly requires additional evaluation and management as an outpatient.  Attempted to contact his primary care physician, unsuccessfully. The patient stated that he can followup tomorrow via telephone, and will follow up with his urologist, hematologist as well. With stable vital signs, the patient has multiple medical problems, and likely  progressive lymphoma, he was discharged in stable condition.        Gerhard Munch, MD 05/27/13 Barry Brunner

## 2013-06-03 ENCOUNTER — Encounter: Payer: Self-pay | Admitting: Gastroenterology

## 2013-06-09 ENCOUNTER — Ambulatory Visit (INDEPENDENT_AMBULATORY_CARE_PROVIDER_SITE_OTHER): Payer: Medicare Other | Admitting: Pulmonary Disease

## 2013-06-09 ENCOUNTER — Encounter: Payer: Self-pay | Admitting: Pulmonary Disease

## 2013-06-09 VITALS — BP 112/68 | HR 84 | Temp 98.0°F | Ht 65.0 in | Wt 138.0 lb

## 2013-06-09 DIAGNOSIS — J479 Bronchiectasis, uncomplicated: Secondary | ICD-10-CM

## 2013-06-09 DIAGNOSIS — R05 Cough: Secondary | ICD-10-CM

## 2013-06-09 NOTE — Patient Instructions (Addendum)
Will send a sputum culture to check for bacteria and MAC.  Would like an early morning specimen, and bring to office that morning. I suspect your cough is coming from your reflux/esophageal disease. Will call you once I receive results of your culture.

## 2013-06-09 NOTE — Assessment & Plan Note (Signed)
The patient has had a long-standing cough that I have always felt was upper airway in origin.  Certainly, bronchiectasis can cause a chronic cough, but it is typically very productive.  The patient has a long history of esophagitis and reflux, and more than likely this is the cause of his cough that historically sounds upper airway.

## 2013-06-09 NOTE — Addendum Note (Signed)
Addended by: Nita Sells on: 06/09/2013 04:30 PM   Modules accepted: Orders

## 2013-06-09 NOTE — Progress Notes (Signed)
  Subjective:    Patient ID: Jenne Pane, male    DOB: 03-04-1919, 79 y.o.   MRN: [id number]  HPI The patient is a 79 year old male who been asked to see for abnormal lung fields on a recent CT abdomen.  I had seen him in the distant past for a chronic cough, as well as mild bilateral lower lobe bronchiectasis.  I have not seen him since 2010, and did try him on a bronchodilator regimen at that time for his cough and bronchiectasis without change in symptoms.  His cough was felt to be upper airway in origin, and probably from reflux.  He comes in today where he has had a recent CT abdomen that showed worsening lower lobe bronchiectasis compared to the 2010 scan.  There was also associated peribronchial nodularity.  The patient has continued to have primarily a dry hacking cough, but he will occasionally produce pale yellow mucous.  He has had some decrease in appetite lately, but thinks this is related to his esophageal problems.  He has lost 6 pounds in the last 3 weeks, but tells me that he is watching his diet closely because of his esophageal issues.  He has not had any night sweats or worsening malaise.   Review of Systems  Constitutional: Positive for unexpected weight change. Negative for fever.  HENT: Negative for ear pain, nosebleeds, congestion, sore throat, rhinorrhea, sneezing, trouble swallowing, dental problem, postnasal drip and sinus pressure.   Eyes: Negative for redness and itching.  Respiratory: Positive for cough. Negative for chest tightness, shortness of breath and wheezing.   Cardiovascular: Positive for chest pain ( center chest---lasted 3 hours 05/27/13) and leg swelling ( feet). Negative for palpitations.  Gastrointestinal: Negative for nausea and vomiting.  Genitourinary: Negative for dysuria.  Musculoskeletal: Positive for joint swelling ( hand).  Skin: Negative for rash.  Neurological: Negative for headaches.  Hematological: Does not bruise/bleed easily.   Psychiatric/Behavioral: Negative for dysphoric mood. The patient is not nervous/anxious.        Objective:   Physical Exam Constitutional:  Well developed, no acute distress  HENT:  Nares patent without discharge  Oropharynx without exudate, palate and uvula are normal  Eyes:  Perrla, eomi, no scleral icterus  Neck:  No JVD, no TMG  Cardiovascular:  Normal rate, regular rhythm, no rubs or gallops.  No murmurs        Intact distal pulses  Pulmonary :  Normal breath sounds, no stridor or respiratory distress   No rhonchi, or wheezing.  Prominent bibasilar crackles.   Abdominal:  Soft, nondistended, bowel sounds present.  No tenderness noted.   Musculoskeletal:  1+ lower extremity edema noted.  Lymph Nodes:  No cervical lymphadenopathy noted  Skin:  No cyanosis noted  Neurologic:  Alert, appropriate, moves all 4 extremities without obvious deficit.         Assessment & Plan:

## 2013-06-09 NOTE — Assessment & Plan Note (Signed)
The patient has a history of lower lobe bronchiectasis dating back to at least 2010, and now clearly has worsening disease on his latest CT from June.  There is no associated peribronchial nodularity that may simply represent inflammatory changes, though could represent colonization with MAC.  There is no definite evidence to suggest that he has an actual infection with MAC.  He has a long history of chronic esophagitis with reflux, and I suspect this is the etiology of his worsening lower lobe bronchiectasis.  I would like to see if he can give [place] a sputum culture to document his colonizing bacterial flora, and also to see if he is colonized with MAC.  If his culture is positive for MAC, I do not see any indication for aggressive treatment.  It would require 3 different antibiotics over the course of a year, and generally had very poor tolerance in the elderly population.

## 2013-06-10 ENCOUNTER — Other Ambulatory Visit: Payer: Medicare Other

## 2013-06-10 DIAGNOSIS — R05 Cough: Secondary | ICD-10-CM

## 2013-06-10 DIAGNOSIS — J479 Bronchiectasis, uncomplicated: Secondary | ICD-10-CM

## 2013-06-14 LAB — RESPIRATORY CULTURE OR RESPIRATORY AND SPUTUM CULTURE

## 2013-06-15 ENCOUNTER — Encounter: Payer: Self-pay | Admitting: Pulmonary Disease

## 2013-06-21 ENCOUNTER — Encounter: Payer: Self-pay | Admitting: *Deleted

## 2013-06-21 NOTE — Telephone Encounter (Signed)
Unable to reach via phone and e-mail. Letter to be sent to pt home.

## 2013-06-24 ENCOUNTER — Telehealth: Payer: Self-pay | Admitting: Pulmonary Disease

## 2013-06-24 ENCOUNTER — Ambulatory Visit (HOSPITAL_BASED_OUTPATIENT_CLINIC_OR_DEPARTMENT_OTHER): Payer: Medicare Other

## 2013-06-24 ENCOUNTER — Ambulatory Visit (HOSPITAL_BASED_OUTPATIENT_CLINIC_OR_DEPARTMENT_OTHER): Payer: Medicare Other | Admitting: Hematology and Oncology

## 2013-06-24 ENCOUNTER — Other Ambulatory Visit (HOSPITAL_BASED_OUTPATIENT_CLINIC_OR_DEPARTMENT_OTHER): Payer: Medicare Other | Admitting: Lab

## 2013-06-24 VITALS — BP 122/74 | HR 82 | Temp 97.0°F | Resp 20 | Ht 65.0 in | Wt 135.6 lb

## 2013-06-24 VITALS — BP 116/57 | HR 60 | Temp 97.0°F | Resp 18

## 2013-06-24 DIAGNOSIS — C8300 Small cell B-cell lymphoma, unspecified site: Secondary | ICD-10-CM

## 2013-06-24 DIAGNOSIS — Z5112 Encounter for antineoplastic immunotherapy: Secondary | ICD-10-CM

## 2013-06-24 DIAGNOSIS — C8599 Non-Hodgkin lymphoma, unspecified, extranodal and solid organ sites: Secondary | ICD-10-CM

## 2013-06-24 DIAGNOSIS — C8583 Other specified types of non-Hodgkin lymphoma, intra-abdominal lymph nodes: Secondary | ICD-10-CM

## 2013-06-24 LAB — COMPREHENSIVE METABOLIC PANEL (CC13)
Albumin: 3 g/dL — ABNORMAL LOW (ref 3.5–5.0)
Alkaline Phosphatase: 137 U/L (ref 40–150)
BUN: 21.5 mg/dL (ref 7.0–26.0)
Glucose: 91 mg/dl (ref 70–140)
Potassium: 4.1 mEq/L (ref 3.5–5.1)

## 2013-06-24 LAB — CBC WITH DIFFERENTIAL/PLATELET
BASO%: 0.4 % (ref 0.0–2.0)
EOS%: 2.5 % (ref 0.0–7.0)
HCT: 43.9 % (ref 38.4–49.9)
LYMPH%: 23.3 % (ref 14.0–49.0)
MCH: 29.3 pg (ref 27.2–33.4)
MCHC: 32.8 g/dL (ref 32.0–36.0)
MCV: 89.2 fL (ref 79.3–98.0)
MONO#: 0.9 10*3/uL (ref 0.1–0.9)
NEUT%: 62.1 % (ref 39.0–75.0)
Platelets: 174 10*3/uL (ref 140–400)

## 2013-06-24 MED ORDER — ONDANSETRON 8 MG/50ML IVPB (CHCC)
8.0000 mg | Freq: Once | INTRAVENOUS | Status: AC
  Administered 2013-06-24: 8 mg via INTRAVENOUS

## 2013-06-24 MED ORDER — SODIUM CHLORIDE 0.9 % IV SOLN
375.0000 mg/m2 | Freq: Once | INTRAVENOUS | Status: AC
  Administered 2013-06-24: 600 mg via INTRAVENOUS
  Filled 2013-06-24: qty 60

## 2013-06-24 MED ORDER — DIPHENHYDRAMINE HCL 25 MG PO CAPS
25.0000 mg | ORAL_CAPSULE | Freq: Once | ORAL | Status: AC
  Administered 2013-06-24: 25 mg via ORAL

## 2013-06-24 MED ORDER — SODIUM CHLORIDE 0.9 % IV SOLN
Freq: Once | INTRAVENOUS | Status: AC
  Administered 2013-06-24: 10:00:00 via INTRAVENOUS

## 2013-06-24 MED ORDER — FAMOTIDINE IN NACL 20-0.9 MG/50ML-% IV SOLN: 20.0000 mg | Freq: Once | INTRAVENOUS | Status: DC

## 2013-06-24 MED ORDER — SODIUM CHLORIDE 0.9 % IV SOLN
50.0000 mg | Freq: Once | INTRAVENOUS | Status: AC
  Administered 2013-06-24: 50 mg via INTRAVENOUS
  Filled 2013-06-24: qty 2

## 2013-06-24 MED ORDER — DEXAMETHASONE SODIUM PHOSPHATE 10 MG/ML IJ SOLN
20.0000 mg | Freq: Once | INTRAMUSCULAR | Status: AC
  Administered 2013-06-24: 20 mg via INTRAVENOUS

## 2013-06-24 MED ORDER — ACETAMINOPHEN 325 MG PO TABS
650.0000 mg | ORAL_TABLET | Freq: Once | ORAL | Status: AC
  Administered 2013-06-24: 650 mg via ORAL

## 2013-06-24 NOTE — Progress Notes (Signed)
CC:   [name], MD Barbaraann Share, MD,FCCP Bertram Millard. Dahlstedt, M.D. Massie Maroon, MD  PROBLEM LIST:  1. 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 Core needle biopsy on  04/21/2008 showed coexpression of CD5 and CD20, also CD19, CD21,  and CD22. CD10 was negative. The patient had involvement of the  peripheral blood and bone marrow when he was evaluated at [facility] in early June 2009. The  patient initially was treated with Rituxan and fludarabine, 4  cycles from 05/31/2008 through 08/22/2008.  The patient had an excellent response to his induction treatment with  Rituxan and fludarabine. Beginning in the fall of 2009, the patient was  maintained on Rituxan every 3 months. However, a CT scan of the abdomen  and pelvis carried out without IV contrast on 05/12/2012 showed mild  progression of mesenteric adenopathy when compared with the CT scan of  the abdomen and pelvis carried out on 04/15/2011. On 05/19/2012,  the patient began receiving Rituxan on a monthly basis.  It should be noted that Mr. Gulbranson underwent hepatitis screening on  10/18/2010. This consisted of hepatitis B surface antibody, hepatitis B  surface antigen, hepatitis B core antibody total, and hepatitis core  antibody IgM. All of these were negative.  The patient had been receiving monthly Rituxan since 05/19/2012. CT  scan of the abdomen and pelvis carried out on 11/13/2012 showed no  significant changes, possibly even some improvement. Rituxan is now  being given every 2 months as of 12/17/2012.  2. Chronic cough possibly related to GERD.  3. Achalasia.  4. COPD.  5. History of kidney stones.  6. History of elevated PSA.  7. BPH.  8. Hearing impairment.  9. Sun related damage of skin.  10. Diverticulosis.  11. Multiple colonic polyps.  12. Macular degeneration, under the care of Dr. Mateo Flow.  13.  Lithotripsy carried out on an obstructing distal left ureteral  stone on 05/18/2012 by Dr. Marcine Matar.   MEDICATIONS:  Reviewed and recorded. Current Outpatient Prescriptions  Medication Sig Dispense Refill  . finasteride (PROSCAR) 5 MG tablet Take 5 mg by mouth daily with breakfast.       . fish oil-omega-3 fatty acids 1000 MG capsule Take 1 g by mouth daily with breakfast.       . furosemide (LASIX) 20 MG tablet Take 20 mg by mouth daily.      Marland Kitchen glucosamine-chondroitin 500-400 MG tablet Take 1 tablet by mouth daily.        . Hypromellose (GENTEAL OP) Place 1 drop into both eyes as needed. Dry eyes      . lansoprazole (PREVACID) 30 MG capsule Take 30 mg by mouth daily.        . Multiple Vitamins-Minerals (ICAPS MV PO) Take 1 tablet by mouth daily.       No current facility-administered medications for this visit.    TREATMENT PROGRAM:  -Rituxan 375 mg/meter squared, equal to 600 mg IV, had been administered from 05/19/2012 through 12/17/2012. Starting 12/17/2012, the patient is receiving Rituxan 600 mg IV every 2 months.  -Decadron 20 mg IV every 2 months with Rituxan.   IMMUNIZATIONS:  -Pneumovax was given in the late 1990s.  -The patient's last flu shot was on 09/19/2011. Patient was urged to obtain a flu shot from his primary physician.   SMOKING HISTORY: Patient has been a nonsmoker except for a pipe in the  past.     HISTORY:  We saw Eytan Carrigan today for followup of his small lymphocytic B cell non-Hodgkin's lymphoma presenting with a large abdominal mass involving the mesentery.  Diagnosis goes back to May 2009, and the patient had stage IV disease on the basis of peripheral blood and bone marrow involvement.  Mr. Carreno was last seen on 04/19/2013, at which time he received another dose of Rituxan 600 mg and Decadron 20 mg IV.  There have been no major problems with his infusions.  He tolerates these well.  The patient denies any problems over the past 2 months.   He continues to have a chronic cough occasionally productive of a thick yellowish sputum.  This problem has been ongoing for at least the last couple of years and does not appear to have changed.  The patient is without any new complaints.  He denies any pain. Blood pressure 122/74, pulse 82, temperature 97 F (36.1 C), temperature source Oral, resp. rate 20, height 5\' 5"  (1.651 m), weight 135 lb 9.6 oz (61.508 kg).  PHYSICAL EXAMINATION:  He shows no changes.  He is 79 years old, fairly sharp mentally, very pleasant and friendly.  There is no scleral icterus.  Mouth and pharynx are benign.  No peripheral adenopathy palpable.  No axillary or inguinal adenopathy.  Lungs continue to reveal inspiratory and expiatory creaking rhonchi at both bases, right greater than left.  Cardiac:  Regular rhythm without murmur or rub.  The patient has some ecchymosis over his right anterior chest.  He does not have a Port-A-Cath or central catheter.  Abdomen:  Unchanged with no palpable masses or organomegaly.  He has fairly large supraumbilical and umbilical hernias as well as a possible diastasis recti.  He has 1-2+ edema of the right lower leg and 1+ left ankle edema.  Neurologic: Nonfocal.  The patient sometimes uses a cane.  He has sun-damaged skin, particularly on his arms.  He has a large cystic lesion over his central forehead, all unchanged.  LABORATORY DATA:    CBC    Component Value Date/Time   WBC 7.2 06/24/2013 0821   WBC 9.0 05/27/2013 1338   RBC 4.92 06/24/2013 0821   RBC 5.22 05/27/2013 1338   HGB 14.4 06/24/2013 0821   HGB 15.1 05/27/2013 1338   HCT 43.9 06/24/2013 0821   HCT 45.8 05/27/2013 1338   PLT 174 06/24/2013 0821   PLT 162 05/27/2013 1338   MCV 89.2 06/24/2013 0821   MCV 87.7 05/27/2013 1338   MCH 29.3 06/24/2013 0821   MCH 28.9 05/27/2013 1338   MCHC 32.8 06/24/2013 0821   MCHC 33.0 05/27/2013 1338   RDW 15.4* 06/24/2013 0821   RDW 15.0 05/27/2013 1338   LYMPHSABS 1.7 06/24/2013  0821   LYMPHSABS 1.1 03/04/2010 1512   MONOABS 0.9 06/24/2013 0821   MONOABS 1.1* 03/04/2010 1512   EOSABS 0.2 06/24/2013 0821   EOSABS 0.0 03/04/2010 1512   BASOSABS 0.0 06/24/2013 0821   BASOSABS 0.1 03/04/2010 1512    Lab Results  Component Value Date   GLUCOSE 96 05/27/2013   BUN 19 05/27/2013   CO2 30 05/27/2013   ALT 174* 05/27/2013   AST 195* 05/27/2013   LDH 187 04/19/2013   K 4.6 05/27/2013   CREATININE 1.13 05/27/2013     IMAGING STUDIES:  1. CT scan of the abdomen and pelvis with IV contrast from 03/04/2010 showed central mesenteric adenopathy. The largest left anterior mesenteric lymph node measures 2.5 x  1.2 cm, felt to be stable in size compared with the prior exam of 03/22/2009. No new adenopathy was noted. There was marked enlargement of the prostate gland with indentation of the urinary bladder base. There is a diverticulum in the right lateral aspect of the urinary bladder, felt to be stable. There was a stable compression fracture of T12 vertebral body. There was patchy airspace disease in the right lower lobe  posteriorly, felt to be due to either infection or inflammatory process.  2. Chest x-ray, 2 view, from 02/15/2011 showed stable chronic bibasilar interstitial disease compared with the chest x-ray of 03/04/2010. No acute findings.  3. CT of the abdomen and pelvis with IV contrast from 04/15/2011 showed progression of peribronchial interstitial thickening within the left and right lower lobes. There was mild interval increase in adenopathy within the central small bowel mesentery. There was increased hydroureter and mild hydronephrosis on the right. A lymph node in the left abdomen measures 18 x 29 mm, similar to 15 x 27 mm on the prior study of 03/04/2010. It was felt that there might be more lymph nodes notable within the mesentery. In the pelvis, there were no enlarged lymph nodes. Again, there was marked enlargement of the prostate gland.  4. CT scan of abdomen and pelvis  without IV contrast on 05/12/2012 showed progression of mesenteric adenopathy. Specifically an index node now  measures 4.5 x 2.4 cm on image 24 versus 2.4 x 1.3 cm on the prior CT scan of 04/15/2011. There were scattered colonic diverticula. There was no retrocrural or retroperitoneal adenopathy. There was moderate to marked enlargement of the prostate with some mild bladder wall thickening and a right-sided bladder diverticulum. There was fat-containing ventral abdominal wall hernia. There was development of moderate left-sided hydroureteronephrosis secondary to a distal left ureteric stone. There was similar right-sided hydroureteronephrosis, felt to be due to prostatic enlargement and bladder outlet obstruction. There was improved appearance at the lung bases. T12 compression deformity was unchanged and there was evidence of prior cholecystectomy. There was a tiny hiatal hernia. 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 Heart size was normal without pericardial or pleural effusion.  5. One-view abdomen on 05/18/2012 showed a 1 cm stone in the distal aspect of the left ureter, felt to be unchanged compared with the abdominal CT scan of 05/12/2012.  6. One-view abdomen on 08/03/2012 showed possible distal left ureteral stone measuring about 6 mm along the left lateral margin of the lower sacrum. There are numerous phleboliths seen in the anatomic pelvis bilaterally, making evaluation for a distal left ureteral stone difficult.  7. Chest x-ray 2 view on 11/13/2012 showed no acute finding. Chronic interstitial change appears stable compared to prior exam from 03/04/2010.  8. CT scan of the abdomen and pelvis with IV contrast On 11/13/2012 showed chronic lower lobe bronchiectasis and peribronchial disease. Stable left-sided mesenteric lymphadenopathy. Stable right-sided hydroureteronephrosis. No obstructing ureteral calculi. Stable bladder diverticulum and enlarged prostate gland. No acute  abdominal/pelvic findings.  9. Left lower extremity venous duplex ultrasound carried out on 02/05/2013  showed no evidence of left lower extremity deep venous thrombosis.  There was no evidence of superficial thrombophlebitis or abnormal fluid  collection.    IMPRESSION/PLAN:  Mr. Brentlinger condition remained stable.  His last CT scan of the abdomen and pelvis was carried out on 11/13/2012 and prior to that on 05/12/2012.  The left-sided lymphadenopathy remains stable.  We will go ahead with another course of treatment with Rituxan 600 mg and  Decadron 20 mg IV.  Mr. Panas will return in approximately 2 months, at which time we will check CBC and chemistries, and he will be due for another course of treatment (08/19/2013).   Plan to repeat his CT scans sometime in the fall of this year.   Zachery Dakins, MD 06/24/2013 9:32 AM

## 2013-06-24 NOTE — Patient Instructions (Addendum)
[facility] Discharge Instructions for Patients Receiving Chemotherapy  Today you received the following chemotherapy agents Rituxan.  To help prevent nausea and vomiting after your treatment, we encourage you to take your nausea medication as prescribed.   If you develop nausea and vomiting that is not controlled by your nausea medication, call the clinic.   BELOW ARE SYMPTOMS THAT SHOULD BE REPORTED IMMEDIATELY:  *FEVER GREATER THAN 100.5 F  *CHILLS WITH OR WITHOUT FEVER  NAUSEA AND VOMITING THAT IS NOT CONTROLLED WITH YOUR NAUSEA MEDICATION  *UNUSUAL SHORTNESS OF BREATH  *UNUSUAL BRUISING OR BLEEDING  TENDERNESS IN MOUTH AND THROAT WITH OR WITHOUT PRESENCE OF ULCERS  *URINARY PROBLEMS  *BOWEL PROBLEMS  UNUSUAL RASH Items with * indicate a potential emergency and should be followed up as soon as possible.  Feel free to call the clinic you have any questions or concerns. The clinic phone number is [phone].    

## 2013-06-24 NOTE — Telephone Encounter (Signed)
Notes Recorded by Barbaraann Share, MD on 06/14/2013 at 5:51 PM Please let pt know that he is growing a small amount of bacteria in sputum called serratia, but would not treat unless a change from baseline. His culture for MAC is still pending.   I spoke with patient about results and he verbalized understanding and had no questions

## 2013-06-30 ENCOUNTER — Institutional Professional Consult (permissible substitution): Payer: Medicare Other | Admitting: Pulmonary Disease

## 2013-07-02 ENCOUNTER — Other Ambulatory Visit: Payer: Self-pay | Admitting: Surgery

## 2013-07-09 ENCOUNTER — Encounter: Payer: Self-pay | Admitting: Gastroenterology

## 2013-07-09 ENCOUNTER — Encounter (HOSPITAL_COMMUNITY): Payer: Self-pay | Admitting: *Deleted

## 2013-07-09 ENCOUNTER — Encounter (HOSPITAL_COMMUNITY): Payer: Self-pay | Admitting: Pharmacy Technician

## 2013-07-09 ENCOUNTER — Ambulatory Visit (INDEPENDENT_AMBULATORY_CARE_PROVIDER_SITE_OTHER): Payer: Medicare Other | Admitting: Gastroenterology

## 2013-07-09 VITALS — BP 100/54 | HR 60 | Ht 65.0 in | Wt 139.4 lb

## 2013-07-09 DIAGNOSIS — K220 Achalasia of cardia: Secondary | ICD-10-CM

## 2013-07-09 DIAGNOSIS — R1319 Other dysphagia: Secondary | ICD-10-CM

## 2013-07-09 NOTE — Patient Instructions (Addendum)
You will be set up for an upper endoscopy at [facility] with MAC sedation, plan for Botox injection for dysphagia and Achalasia.                                              We are excited to introduce MyChart, a new best-in-class service that provides you online access to important information in your electronic medical record. We want to make it easier for you to view your health information - all in one secure location - when and where you need it. We expect MyChart will enhance the quality of care and service we provide.  When you register for MyChart, you can:    View your test results.    Request appointments and receive appointment reminders via email.    Request medication renewals.    View your medical history, allergies, medications and immunizations.    Communicate with your physician's office through a password-protected site.    Conveniently print information such as your medication lists.  To find out if MyChart is right for you, please talk to a member of our clinical staff today. We will gladly answer your questions about this free health and wellness tool.  If you are age 54 or older and want a member of your family to have access to your record, you must provide written consent by completing a proxy form available at our office. Please speak to our clinical staff about guidelines regarding accounts for patients younger than age 86.  As you activate your MyChart account and need any technical assistance, please call the MyChart technical support line at [phone]-CHART [phone]) or email your question to [email or web address]. If you email your question(s), please include your name, a return phone number and the best time to reach you.  If you have non-urgent health-related questions, you can send a message to our office through MyChart at [email or web address]. If you have a medical emergency, call 911.  Thank you for using MyChart as your new health and wellness  resource!   MyChart licensed from [employer],  [id number]. Patents Pending.

## 2013-07-09 NOTE — Progress Notes (Signed)
Review of gastrointestinal problems:  1. Presumed achalasia. Previously a patient of Dr. Virginia Rochester with extensive workup (Failed manometry testing, the catheter coiled in his esophagus) . Most recent EGD November, 2010 found tight GE junction, slightly dilated distal esophagus, findings consistent with achalasia, botulinum toxin was injected. Injection improved his swallowing significantly however he is still careful about what he eats, eats slowly, takes small bites, chews his food very well.   HPI: This is a  very pleasant 79 year old man whom I last saw 2-3 years ago  Had a chest pain about a month ago.  Lasted about an hour.  [facility].  Several tests done, none implicating his heart.   Has been drinking a lot of coffee, soft drinks, eating before bed.   After ER visit, he changed a lot of habit (less caffeine).  He does have dysphagia.  Will normally eat slow, take small bites.  Sometimes even liquids are a problem for him.  He feels he has lost weight over many years (12 pounds in 5 years).  Was in [place], [place], [place].  He was [employer] [place].  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 Tanya Nones father was in same troop, outfit.     Past Medical History  Diagnosis Date  . Chronic kidney disease   . NHL (non-Hodgkin's lymphoma)     nhl dx 5/09;  . HOH (hard of hearing)   . Diverticulosis   . BPH (benign prostatic hyperplasia)   . Congenital malformation of spine     T12  . Hx of hydronephrosis     Past Surgical History  Procedure Laterality Date  . Cholecystectomy    . Prostate surgery    . Eye surgery      Implants bilateral  . Inguinal hernia repair      left    Current Outpatient Prescriptions  Medication Sig Dispense Refill  . finasteride (PROSCAR) 5 MG tablet Take 5 mg by mouth daily with breakfast.       . fish oil-omega-3 fatty acids 1000 MG capsule Take 1 g by mouth daily with breakfast.       . furosemide (LASIX) 20 MG tablet Take 20 mg by  mouth daily.      Marland Kitchen glucosamine-chondroitin 500-400 MG tablet Take 1 tablet by mouth daily.        . Hypromellose (GENTEAL OP) Place 1 drop into both eyes as needed. Dry eyes      . lansoprazole (PREVACID) 30 MG capsule Take 30 mg by mouth daily.        . Multiple Vitamins-Minerals (ICAPS MV PO) Take 1 tablet by mouth daily.       No current facility-administered medications for this visit.    Allergies as of 07/09/2013  . (No Known Allergies)    Family History  Problem Relation Age of Onset  . Tuberculosis Mother   . Blindness Father     History   Social History  . Marital Status: Married    Spouse Name: N/A    Number of Children: N/A  . Years of Education: N/A   Occupational History  . retired    Social History Main Topics  . Smoking status: Former Smoker -- 3 years    Types: Pipe    Quit date: 12/10/1959  . Smokeless tobacco: Former Neurosurgeon     Comment: Pt states that he never inhaled. Pt also states that while in service he smoked cigs x 2 months.   Marland Kitchen  Alcohol Use: 0.6 oz/week    1 Glasses of wine per week     Comment: Rarely  . Drug Use: No  . Sexually Active: Not on file   Other Topics Concern  . Not on file   Social History Narrative  . No narrative on file      Physical Exam: BP 100/54  Pulse 60  Ht 5\' 5"  (1.651 m)  Wt 139 lb 6.4 oz (63.231 kg)  BMI 23.2 kg/m2 Constitutional: Elderly, a bit frail but mentally completely normal Psychiatric: alert and oriented x3 Abdomen: soft, nontender, nondistended, no obvious ascites, no peritoneal signs, normal bowel sounds     Assessment and plan: 79 y.o. male with dysphasia, intermittent cough, history of achalasia  Recalls and I also recall that previous Botox injection for achalasia was very helpful for him. We will set up EGD at [facility] long MAC sedation for repeat Botox injection.

## 2013-07-09 NOTE — Progress Notes (Signed)
Showed dr ewell chext xray results from 05-27-2013, chest xray needs to be repeated before 07-15-2013 procedure.

## 2013-07-14 ENCOUNTER — Other Ambulatory Visit: Payer: Self-pay

## 2013-07-15 ENCOUNTER — Encounter (HOSPITAL_COMMUNITY): Payer: Self-pay | Admitting: *Deleted

## 2013-07-15 ENCOUNTER — Ambulatory Visit (HOSPITAL_COMMUNITY)
Admission: RE | Admit: 2013-07-15 | Discharge: 2013-07-15 | Disposition: A | Discharge status: Home / Self Care | Payer: Medicare Other | Source: Ambulatory Visit | Attending: Gastroenterology | Admitting: Gastroenterology

## 2013-07-15 ENCOUNTER — Encounter (HOSPITAL_COMMUNITY): Payer: Self-pay

## 2013-07-15 ENCOUNTER — Ambulatory Visit (HOSPITAL_COMMUNITY): Payer: Medicare Other | Admitting: *Deleted

## 2013-07-15 ENCOUNTER — Encounter (HOSPITAL_COMMUNITY)
Admission: RE | Disposition: A | Discharge status: Home / Self Care | Payer: Self-pay | Source: Ambulatory Visit | Attending: Gastroenterology

## 2013-07-15 ENCOUNTER — Telehealth: Payer: Self-pay

## 2013-07-15 DIAGNOSIS — N189 Chronic kidney disease, unspecified: Secondary | ICD-10-CM | POA: Insufficient documentation

## 2013-07-15 DIAGNOSIS — R1319 Other dysphagia: Secondary | ICD-10-CM

## 2013-07-15 DIAGNOSIS — R079 Chest pain, unspecified: Secondary | ICD-10-CM | POA: Insufficient documentation

## 2013-07-15 DIAGNOSIS — Z79899 Other long term (current) drug therapy: Secondary | ICD-10-CM | POA: Insufficient documentation

## 2013-07-15 DIAGNOSIS — K220 Achalasia of cardia: Secondary | ICD-10-CM | POA: Insufficient documentation

## 2013-07-15 DIAGNOSIS — R1310 Dysphagia, unspecified: Secondary | ICD-10-CM | POA: Insufficient documentation

## 2013-07-15 DIAGNOSIS — N400 Enlarged prostate without lower urinary tract symptoms: Secondary | ICD-10-CM | POA: Insufficient documentation

## 2013-07-15 DIAGNOSIS — C8589 Other specified types of non-Hodgkin lymphoma, extranodal and solid organ sites: Secondary | ICD-10-CM | POA: Insufficient documentation

## 2013-07-15 HISTORY — DX: Cough: R05

## 2013-07-15 HISTORY — PX: ESOPHAGOGASTRODUODENOSCOPY (EGD) WITH PROPOFOL: SHX5813

## 2013-07-15 HISTORY — DX: Personal history of urinary calculi: Z87.442

## 2013-07-15 HISTORY — DX: Sleep apnea, unspecified: G47.30

## 2013-07-15 HISTORY — DX: Cough, unspecified: R05.9

## 2013-07-15 HISTORY — DX: Benign prostatic hyperplasia without lower urinary tract symptoms: N40.0

## 2013-07-15 HISTORY — PX: BOTOX INJECTION: SHX5754

## 2013-07-15 SURGERY — ESOPHAGOGASTRODUODENOSCOPY (EGD) WITH PROPOFOL
Anesthesia: Monitor Anesthesia Care

## 2013-07-15 MED ORDER — LACTATED RINGERS IV SOLN
INTRAVENOUS | Status: DC
  Administered 2013-07-15: 1000 mL via INTRAVENOUS

## 2013-07-15 MED ORDER — FENTANYL CITRATE 0.05 MG/ML IJ SOLN
INTRAMUSCULAR | Status: DC | PRN
  Administered 2013-07-15 (×2): 25 ug via INTRAVENOUS

## 2013-07-15 MED ORDER — SODIUM CHLORIDE 0.9 % IV SOLN
INTRAVENOUS | Status: DC
  Administered 2013-07-15: 07:00:00 via INTRAVENOUS

## 2013-07-15 MED ORDER — PROPOFOL 10 MG/ML IV BOLUS
INTRAVENOUS | Status: DC | PRN
  Administered 2013-07-15 (×2): 20 mg via INTRAVENOUS

## 2013-07-15 MED ORDER — BUTAMBEN-TETRACAINE-BENZOCAINE 2-2-14 % EX AERO
INHALATION_SPRAY | CUTANEOUS | Status: DC | PRN
  Administered 2013-07-15: 2 via TOPICAL

## 2013-07-15 MED ORDER — KETAMINE HCL 10 MG/ML IJ SOLN
INTRAMUSCULAR | Status: DC | PRN
  Administered 2013-07-15: 10 mg via INTRAVENOUS

## 2013-07-15 MED ORDER — SODIUM CHLORIDE 0.9 % IJ SOLN
100.0000 [IU] | Freq: Once | INTRAMUSCULAR | Status: DC
  Filled 2013-07-15: qty 100

## 2013-07-15 SURGICAL SUPPLY — 15 items

## 2013-07-15 NOTE — Anesthesia Preprocedure Evaluation (Addendum)
Anesthesia Evaluation  Patient identified by MRN, date of birth, ID band Patient awake    Reviewed: Allergy & Precautions, H&P , NPO status , Patient's Chart, lab work & pertinent test results  Airway Mallampati: II TM Distance: >3 FB Neck ROM: Full    Dental no notable dental hx. (+) Partial Upper   Pulmonary neg pulmonary ROS, sleep apnea ,  breath sounds clear to auscultation  Pulmonary exam normal       Cardiovascular negative cardio ROS  Rhythm:Regular Rate:Normal  RBBB   Neuro/Psych HOH negative neurological ROS  negative psych ROS   GI/Hepatic negative GI ROS, Neg liver ROS,   Endo/Other  negative endocrine ROS  Renal/GU negative Renal ROS  negative genitourinary   Musculoskeletal negative musculoskeletal ROS (+)   Abdominal   Peds negative pediatric ROS (+)  Hematology negative hematology ROS (+) Non-Hodgkin lymphoma   Anesthesia Other Findings   Reproductive/Obstetrics negative OB ROS                          Anesthesia Physical Anesthesia Plan  ASA: II  Anesthesia Plan: MAC   Post-op Pain Management:    Induction:   Airway Management Planned:   Additional Equipment:   Intra-op Plan:   Post-operative Plan:   Informed Consent: I have reviewed the patients History and Physical, chart, labs and discussed the procedure including the risks, benefits and alternatives for the proposed anesthesia with the patient or authorized representative who has indicated his/her understanding and acceptance.   Dental advisory given  Plan Discussed with: CRNA  Anesthesia Plan Comments:         Anesthesia Quick Evaluation

## 2013-07-15 NOTE — Telephone Encounter (Signed)
Message copied by Donata Duff on Thu Jul 15, 2013  8:31 AM ------      Message from: Rob Bunting P      Created: Thu Jul 15, 2013  8:02 AM       Jacolyn Joaquin, he needs rov in 6-8 weeks,            Thanks       ------

## 2013-07-15 NOTE — Preoperative (Signed)
Beta Blockers   Reason not to administer Beta Blockers:Not Applicable 

## 2013-07-15 NOTE — Anesthesia Postprocedure Evaluation (Signed)
  Anesthesia Post-op Note  Patient: Daniel [name]  Procedure(s) Performed: Procedure(s) (LRB): ESOPHAGOGASTRODUODENOSCOPY (EGD) WITH PROPOFOL (N/A) BOTOX INJECTION (N/A)  Patient Location: PACU  Anesthesia Type: MAC  Level of Consciousness: awake and alert   Airway and Oxygen Therapy: Patient Spontanous Breathing  Post-op Pain: mild  Post-op Assessment: Post-op Vital signs reviewed, Patient's Cardiovascular Status Stable, Respiratory Function Stable, Patent Airway and No signs of Nausea or vomiting  Last Vitals:  Filed Vitals:   07/15/13 0820  BP: 133/79  Pulse:   Temp:   Resp: 16    Post-op Vital Signs: stable   Complications: No apparent anesthesia complications

## 2013-07-15 NOTE — Telephone Encounter (Signed)
Letter mailed

## 2013-07-15 NOTE — Op Note (Signed)
[facility] [address] [place], [address]   ENDOSCOPY PROCEDURE REPORT  PATIENT: Daniel [name], Daniel [name]  MR#: [id number] BIRTHDATE: 03-08-1919 , 89  yrs. old GENDER: Male ENDOSCOPIST: Rachael Fee, MD PROCEDURE DATE:  07/15/2013 PROCEDURE:  EGD w/ directed submucosal injection(s), any substance ASA CLASS:     Class III INDICATIONS:  achalasia.  Previously a patient of Dr.  Virginia Rochester with extensive workup (Failed manometry testing, the catheter coiled in his esophagus) .  Most recent EGD November, 2010 found tight GE junction, slightly dilated distal esophagus, findings consistent with achalasia, botulinum toxin was injected.  Injection improved his swallowing significantly however he is still careful about what he eats, eats slowly, takes small bites, chews his food very well.Marland Kitchen  MEDICATIONS: MAC sedation, administered by CRNA TOPICAL ANESTHETIC: Cetacaine Spray  DESCRIPTION OF PROCEDURE: After the risks benefits and alternatives of the procedure were thoroughly explained, informed consent was obtained.  The Pentax Gastroscope [id number] endoscope was introduced through the mouth and advanced to the second portion of the duodenum. Without limitations.  The instrument was slowly withdrawn as the mucosa was fully examined.    The GE junction was very snug, nearly pinpoint without mucosal disease.  The esophagus proximal to the GE junction was dilated and tortuous.  I was able to advance gastroscope through the GE junction with moderate resistence and the remaining UGI tract was normal.  I injected the GE junction (antegrade) with 25units Botox injected in 4 quadrants.  Retroflexed views revealed no abnormalities.     The scope was then withdrawn from the patient and the procedure completed. COMPLICATIONS: There were no complications.  ENDOSCOPIC IMPRESSION: The GE junction was very snug, nearly pinpoint without mucosal disease.  The esophagus proximal to the GE  junction was dilated and tortuous.  I was able to advance gastroscope through the GE junction with moderate resistence and the remaining UGI tract was normal.  I injected the GE junction (antegrade) with 25units Botox injected in 4 quadrants.  RECOMMENDATIONS: My office will call to set up return visit in 6-8 weeks to see how you responded to this Botox injection.   eSigned:  Rachael Fee, MD 07/15/2013 8:00 AM   CC: Pearson Grippe, MD

## 2013-07-15 NOTE — Interval H&P Note (Signed)
History and Physical Interval Note:  07/15/2013 7:14 AM  Daniel [name]  has presented today for surgery, with the diagnosis of Dysphasia [784.59] Achalasia [530.0]  The various methods of treatment have been discussed with the patient and family. After consideration of risks, benefits and other options for treatment, the patient has consented to  Procedure(s) with comments: ESOPHAGOGASTRODUODENOSCOPY (EGD) WITH PROPOFOL (N/A) - botox injection BOTOX INJECTION (N/A) as a surgical intervention .  The patient's history has been reviewed, patient examined, no change in status, stable for surgery.  I have reviewed the patient's chart and labs.  Questions were answered to the patient's satisfaction.     Rob Bunting

## 2013-07-15 NOTE — H&P (View-Only) (Signed)
Review of gastrointestinal problems:  1. Presumed achalasia. Previously a patient of Dr. Orr with extensive workup (Failed manometry testing, the catheter coiled in his esophagus) . Most recent EGD November, 2010 found tight GE junction, slightly dilated distal esophagus, findings consistent with achalasia, botulinum toxin was injected. Injection improved his swallowing significantly however he is still careful about what he eats, eats slowly, takes small bites, chews his food very well.   HPI: This is a  very pleasant 79-year-old man whom I last saw 2-3 years ago  Had a chest pain about a month ago.  Lasted about an hour.  W[facility] to ER[facility].  Several tests done, none implicating his heart.   Has been drinking a lot of coffee, soft drinks, eating before bed.   After ER visit, he changed a lot of habit (less caffeine).  He does have dysphagia.  Will normally eat slow, take small bites.  Sometimes even liquids are a problem for him.  He feels he has lost weight over many years (12 pounds in 5 years).  Was in [place], [place], [place].  He was [employer] [place] [employer].  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     Past Medical History  Diagnosis Date  . Chronic kidney disease   . NHL (non-Hodgkin's lymphoma)     nhl dx 5/09;  . HOH (hard of hearing)   . Diverticulosis   . BPH (benign prostatic hyperplasia)   . Congenital malformation of spine     T12  . Hx of hydronephrosis     Past Surgical History  Procedure Laterality Date  . Cholecystectomy    . Prostate surgery    . Eye surgery      Implants bilateral  . Inguinal hernia repair      left    Current Outpatient Prescriptions  Medication Sig Dispense Refill  . finasteride (PROSCAR) 5 MG tablet Take 5 mg by mouth daily with breakfast.       . fish oil-omega-3 fatty acids 1000 MG capsule Take 1 g by mouth daily with breakfast.       . furosemide (LASIX) 20 MG tablet Take 20 mg by  mouth daily.      . glucosamine-chondroitin 500-400 MG tablet Take 1 tablet by mouth daily.        . Hypromellose (GENTEAL OP) Place 1 drop into both eyes as needed. Dry eyes      . lansoprazole (PREVACID) 30 MG capsule Take 30 mg by mouth daily.        . Multiple Vitamins-Minerals (ICAPS MV PO) Take 1 tablet by mouth daily.       No current facility-administered medications for this visit.    Allergies as of 07/09/2013  . (No Known Allergies)    Family History  Problem Relation Age of Onset  . Tuberculosis Mother   . Blindness Father     History   Social History  . Marital Status: Married    Spouse Name: N/A    Number of Children: N/A  . Years of Education: N/A   Occupational History  . retired    Social History Main Topics  . Smoking status: Former Smoker -- 3 years    Types: Pipe    Quit date: 12/10/1959  . Smokeless tobacco: Former User     Comment: Pt states that he never inhaled. Pt also states that while in service he smoked cigs x 2 months.   .   Alcohol Use: 0.6 oz/week    1 Glasses of wine per week     Comment: Rarely  . Drug Use: No  . Sexually Active: Not on file   Other Topics Concern  . Not on file   Social History Narrative  . No narrative on file      Physical Exam: BP 100/54  Pulse 60  Ht 5' 5" (1.651 m)  Wt 139 lb 6.4 oz (63.231 kg)  BMI 23.2 kg/m2 Constitutional: Elderly, a bit frail but mentally completely normal Psychiatric: alert and oriented x3 Abdomen: soft, nontender, nondistended, no obvious ascites, no peritoneal signs, normal bowel sounds     Assessment and plan: 79 y.o. male with dysphasia, intermittent cough, history of achalasia  Recalls and I also recall that previous Botox injection for achalasia was very helpful for him. We will set up EGD at [facility] MAC sedation for repeat Botox injection.  

## 2013-07-15 NOTE — Transfer of Care (Signed)
Immediate Anesthesia Transfer of Care Note  Patient: Daniel [name]  Procedure(s) Performed: Procedure(s) with comments: ESOPHAGOGASTRODUODENOSCOPY (EGD) WITH PROPOFOL (N/A) - botox injection BOTOX INJECTION (N/A)  Patient Location: PACU and Endoscopy Unit  Anesthesia Type:MAC  Level of Consciousness: awake, alert  and oriented  Airway & Oxygen Therapy: Patient Spontanous Breathing and Patient connected to nasal cannula oxygen  Post-op Assessment: Report given to PACU RN, Post -op Vital signs reviewed and stable and Patient moving all extremities  Post vital signs: Reviewed and stable  Complications: No apparent anesthesia complications

## 2013-07-16 ENCOUNTER — Encounter (HOSPITAL_COMMUNITY): Payer: Self-pay | Admitting: Gastroenterology

## 2013-07-23 ENCOUNTER — Encounter: Payer: Self-pay | Admitting: *Deleted

## 2013-08-13 ENCOUNTER — Other Ambulatory Visit: Payer: Self-pay | Admitting: Physician Assistant

## 2013-09-08 ENCOUNTER — Encounter: Payer: Self-pay | Admitting: Gastroenterology

## 2013-09-08 ENCOUNTER — Ambulatory Visit (INDEPENDENT_AMBULATORY_CARE_PROVIDER_SITE_OTHER): Payer: Medicare Other | Admitting: Gastroenterology

## 2013-09-08 VITALS — BP 110/60 | HR 76 | Ht 65.0 in | Wt 141.0 lb

## 2013-09-08 DIAGNOSIS — K220 Achalasia of cardia: Secondary | ICD-10-CM

## 2013-09-08 NOTE — Progress Notes (Signed)
Review of gastrointestinal problems:  1. Presumed achalasia. Previously a patient of Dr. Virginia Rochester with extensive workup (Failed manometry testing, the catheter coiled in his esophagus) . Most recent EGD November, 2010 found tight GE junction, slightly dilated distal esophagus, findings consistent with achalasia, botulinum toxin was injected. Injection improved his swallowing significantly however he is still careful about what he eats, eats slowly, takes small bites, chews his food very well.  Repeat EGD, botox injection 07/2013 for worsening dysphagia.   HPI: This is a   very pleasant 79 year old man whom I last saw the time of EGD with Botox injection about 5 or 6 weeks ago.   He has not a big improvement in swallowing since Botox injection about 6 weeks.   Swallowing has been overall fine.  He was having dysphagia with just about every meal prior to the botox injection. Did have chest pain one 1-2 occasions, not with SOB.    Past Medical History  Diagnosis Date  . HOH (hard of hearing)   . Diverticulosis   . BPH (benign prostatic hyperplasia)   . Congenital malformation of spine     T12  . Hx of hydronephrosis   . NHL (non-Hodgkin's lymphoma)     nhl dx 5/09;  . History of kidney stones 2013  . Cough for last 2 years    occasional white sputum  . Sleep apnea   . Prostate enlargement   . Non-Hodgkin lymphoma     Past Surgical History  Procedure Laterality Date  . Prostate surgery  yrs ago  . Eye surgery  yrs ago    Implants bilateral  . Inguinal hernia repair  yrs ago    left  . Cholecystectomy  yrs ago  . Esophagogastroduodenoscopy (egd) with propofol N/A 07/15/2013    Procedure: ESOPHAGOGASTRODUODENOSCOPY (EGD) WITH PROPOFOL;  Surgeon: Rachael Fee, MD;  Location: [facility] ENDOSCOPY;  Service: Endoscopy;  Laterality: N/A;  botox injection  . Botox injection N/A 07/15/2013    Procedure: BOTOX INJECTION;  Surgeon: Rachael Fee, MD;  Location: [facility] ENDOSCOPY;  Service: Endoscopy;   Laterality: N/A;    Current Outpatient Prescriptions  Medication Sig Dispense Refill  . finasteride (PROSCAR) 5 MG tablet Take 5 mg by mouth daily with breakfast.       . fish oil-omega-3 fatty acids 1000 MG capsule Take 1 g by mouth daily with breakfast.       . furosemide (LASIX) 20 MG tablet Take 20 mg by mouth every morning.       Marland Kitchen glucosamine-chondroitin 500-400 MG tablet Take 1 tablet by mouth daily.       . Hypromellose (GENTEAL OP) Place 1 drop into both eyes 2 (two) times daily as needed. Dry eyes      . lansoprazole (PREVACID) 30 MG capsule Take 30 mg by mouth daily.        . Multiple Vitamins-Minerals (ICAPS MV PO) Take 1 tablet by mouth daily.       No current facility-administered medications for this visit.    Allergies as of 09/08/2013  . (No Known Allergies)    Family History  Problem Relation Age of Onset  . Tuberculosis Mother   . Blindness Father     History   Social History  . Marital Status: Married    Spouse Name: N/A    Number of Children: N/A  . Years of Education: N/A   Occupational History  . retired    Social History Main Topics  . Smoking status:  Former Smoker -- 0.25 packs/day for 3 years    Types: Pipe    Quit date: 12/10/1959  . Smokeless tobacco: Never Used     Comment: Pt states that he never inhaled. Pt also states that while in service he smoked cigs x 2 months.   . Alcohol Use: 0.6 oz/week    1 Glasses of wine per week     Comment: occasional  . Drug Use: No  . Sexual Activity: Not on file   Other Topics Concern  . Not on file   Social History Narrative  . No narrative on file      Physical Exam: BP 110/60  Pulse 76  Ht 5\' 5"  (1.651 m)  Wt 141 lb (63.957 kg)  BMI 23.46 kg/m2 Constitutional: generally well-appearing Psychiatric: alert and oriented x3 Abdomen: soft, nontender, nondistended, no obvious ascites, no peritoneal signs, normal bowel sounds     Assessment and plan: 79 y.o. male with  achalasia,  dysphasia symptoms have improved since Botox injection  He knows to continue to chew his food well, eat slowly and take small bites. If his dysphagia significantly worsens again then I would plan to repeat Botox injection.

## 2013-09-08 NOTE — Patient Instructions (Addendum)
Continue to eat slowly, take small bites, chew very well. If your swallowing becomes bad again, call Dr. Christella Hartigan' office to consider repeating EGD and botox injection.

## 2013-10-14 ENCOUNTER — Other Ambulatory Visit: Payer: Self-pay

## 2013-11-24 ENCOUNTER — Telehealth: Payer: Self-pay | Admitting: Hematology & Oncology

## 2013-11-24 NOTE — Telephone Encounter (Signed)
Pt called said he wanted to confirm appointment for Friday. He doesn't have any scheduled. I talked with Dolores Lory who said she will have nurse call pt with appointments. I gave pt Mellissa's number and mine and to call her if he doesn't hear from her 12-18 afternoon. I gave him my number and he is aware to call me if he doesn't hear from anyone.

## 2013-11-25 ENCOUNTER — Telehealth: Payer: Self-pay | Admitting: Internal Medicine

## 2013-11-25 NOTE — Telephone Encounter (Signed)
received call from rick at [facility] stating [facility] called [facility] re an appt for 12/19. pt has not been see in office since 06/24/13. no was sent at that time re future appts. s/w desk nurse and pt should have returned sept 2014. per desk nurse added appts for lb/fu/tx (rapid rituxan) 12/15/13. s/w pt he is aware and schedule mailed

## 2013-12-15 ENCOUNTER — Other Ambulatory Visit (HOSPITAL_BASED_OUTPATIENT_CLINIC_OR_DEPARTMENT_OTHER): Payer: Medicare Other

## 2013-12-15 ENCOUNTER — Ambulatory Visit (HOSPITAL_BASED_OUTPATIENT_CLINIC_OR_DEPARTMENT_OTHER): Payer: Medicare Other | Admitting: Internal Medicine

## 2013-12-15 ENCOUNTER — Ambulatory Visit (HOSPITAL_BASED_OUTPATIENT_CLINIC_OR_DEPARTMENT_OTHER): Payer: Medicare Other

## 2013-12-15 ENCOUNTER — Telehealth: Payer: Self-pay | Admitting: Internal Medicine

## 2013-12-15 ENCOUNTER — Other Ambulatory Visit: Payer: Self-pay | Admitting: Internal Medicine

## 2013-12-15 ENCOUNTER — Telehealth: Payer: Self-pay | Admitting: *Deleted

## 2013-12-15 VITALS — BP 103/55 | HR 77 | Temp 98.0°F | Resp 18

## 2013-12-15 VITALS — BP 135/67 | HR 79 | Temp 97.4°F | Resp 18 | Ht 65.0 in | Wt 143.9 lb

## 2013-12-15 DIAGNOSIS — Z5112 Encounter for antineoplastic immunotherapy: Secondary | ICD-10-CM | POA: Diagnosis not present

## 2013-12-15 DIAGNOSIS — R059 Cough, unspecified: Secondary | ICD-10-CM

## 2013-12-15 DIAGNOSIS — C8300 Small cell B-cell lymphoma, unspecified site: Secondary | ICD-10-CM

## 2013-12-15 DIAGNOSIS — C8583 Other specified types of non-Hodgkin lymphoma, intra-abdominal lymph nodes: Secondary | ICD-10-CM | POA: Diagnosis not present

## 2013-12-15 DIAGNOSIS — R05 Cough: Secondary | ICD-10-CM

## 2013-12-15 LAB — CBC WITH DIFFERENTIAL/PLATELET
BASO%: 0.6 % (ref 0.0–2.0)
Basophils Absolute: 0.1 10*3/uL (ref 0.0–0.1)
EOS ABS: 0.2 10*3/uL (ref 0.0–0.5)
EOS%: 2.5 % (ref 0.0–7.0)
HCT: 43.3 % (ref 38.4–49.9)
HGB: 14.1 g/dL (ref 13.0–17.1)
LYMPH%: 29.8 % (ref 14.0–49.0)
MCH: 29.6 pg (ref 27.2–33.4)
MCHC: 32.6 g/dL (ref 32.0–36.0)
MCV: 91 fL (ref 79.3–98.0)
MONO#: 1.1 10*3/uL — ABNORMAL HIGH (ref 0.1–0.9)
MONO%: 14.1 % — AB (ref 0.0–14.0)
NEUT#: 4.1 10*3/uL (ref 1.5–6.5)
NEUT%: 53 % (ref 39.0–75.0)
NRBC: 0 % (ref 0–0)
PLATELETS: 165 10*3/uL (ref 140–400)
RBC: 4.76 10*6/uL (ref 4.20–5.82)
RDW: 15.5 % — AB (ref 11.0–14.6)
WBC: 7.7 10*3/uL (ref 4.0–10.3)
lymph#: 2.3 10*3/uL (ref 0.9–3.3)

## 2013-12-15 LAB — COMPREHENSIVE METABOLIC PANEL (CC13)
ALK PHOS: 131 U/L (ref 40–150)
ALT: 21 U/L (ref 0–55)
ANION GAP: 8 meq/L (ref 3–11)
AST: 26 U/L (ref 5–34)
Albumin: 3.6 g/dL (ref 3.5–5.0)
BILIRUBIN TOTAL: 0.95 mg/dL (ref 0.20–1.20)
BUN: 17.9 mg/dL (ref 7.0–26.0)
CO2: 28 meq/L (ref 22–29)
CREATININE: 1.1 mg/dL (ref 0.7–1.3)
Calcium: 9.4 mg/dL (ref 8.4–10.4)
Chloride: 105 mEq/L (ref 98–109)
GLUCOSE: 92 mg/dL (ref 70–140)
Potassium: 4.3 mEq/L (ref 3.5–5.1)
SODIUM: 141 meq/L (ref 136–145)
TOTAL PROTEIN: 6.2 g/dL — AB (ref 6.4–8.3)

## 2013-12-15 LAB — LACTATE DEHYDROGENASE (CC13): LDH: 216 U/L (ref 125–245)

## 2013-12-15 MED ORDER — DEXAMETHASONE SODIUM PHOSPHATE 20 MG/5ML IJ SOLN
INTRAMUSCULAR | Status: AC
  Filled 2013-12-15: qty 5

## 2013-12-15 MED ORDER — DEXAMETHASONE SODIUM PHOSPHATE 10 MG/ML IJ SOLN
20.0000 mg | Freq: Once | INTRAMUSCULAR | Status: AC
  Administered 2013-12-15: 20 mg via INTRAVENOUS

## 2013-12-15 MED ORDER — ONDANSETRON 8 MG/NS 50 ML IVPB
INTRAVENOUS | Status: AC
  Filled 2013-12-15: qty 8

## 2013-12-15 MED ORDER — ONDANSETRON 16 MG/50ML IVPB (CHCC)
INTRAVENOUS | Status: AC
  Filled 2013-12-15: qty 16

## 2013-12-15 MED ORDER — DIPHENHYDRAMINE HCL 25 MG PO CAPS
ORAL_CAPSULE | ORAL | Status: AC
  Filled 2013-12-15: qty 1

## 2013-12-15 MED ORDER — ACETAMINOPHEN 325 MG PO TABS
650.0000 mg | ORAL_TABLET | Freq: Once | ORAL | Status: AC
  Administered 2013-12-15: 650 mg via ORAL

## 2013-12-15 MED ORDER — ONDANSETRON 8 MG/50ML IVPB (CHCC)
8.0000 mg | Freq: Once | INTRAVENOUS | Status: AC
  Administered 2013-12-15: 8 mg via INTRAVENOUS

## 2013-12-15 MED ORDER — DIPHENHYDRAMINE HCL 25 MG PO CAPS
25.0000 mg | ORAL_CAPSULE | Freq: Once | ORAL | Status: AC
  Administered 2013-12-15: 25 mg via ORAL

## 2013-12-15 MED ORDER — FAMOTIDINE IN NACL 20-0.9 MG/50ML-% IV SOLN
20.0000 mg | Freq: Once | INTRAVENOUS | Status: AC
  Administered 2013-12-15: 20 mg via INTRAVENOUS

## 2013-12-15 MED ORDER — FAMOTIDINE IN NACL 20-0.9 MG/50ML-% IV SOLN
INTRAVENOUS | Status: AC
  Filled 2013-12-15: qty 50

## 2013-12-15 MED ORDER — RITUXIMAB CHEMO INJECTION 10 MG/ML
375.0000 mg/m2 | Freq: Once | INTRAVENOUS | Status: AC
  Administered 2013-12-15: 600 mg via INTRAVENOUS
  Filled 2013-12-15: qty 60

## 2013-12-15 MED ORDER — ACETAMINOPHEN 325 MG PO TABS
ORAL_TABLET | ORAL | Status: AC
  Filled 2013-12-15: qty 2

## 2013-12-15 NOTE — Progress Notes (Signed)
[facility] OFFICE PROGRESS NOTE  Daniel Gravel, MD [facility] [place] [address]  DIAGNOSIS: Lymphoma, small lymphocytic - Plan: CBC with Differential, Comprehensive metabolic panel (Cmet) - [facility], Lactate dehydrogenase (LDH) - [facility], CT Abdomen Pelvis Wo Contrast  Chief Complaint  Patient presents with  . Lymphoma, small lymphocytic    CURRENT THERAPY: -Rituxan 375 mg/meter squared, equal to 600 mg IV, had been administered from 05/19/2012 through 12/17/2012. Starting 12/17/2012, the patient is receiving Rituxan 600 mg IV every 2 months. Decadron 20 mg IV every 2 months with Rituxan.  Last received 06/24/2013.  INTERVAL HISTORY: Daniel [name] 80 y.o. male with a history of small lymphocytic B cell non-Hodgkin's lymphoma is here for followup. His lymphoma presented as a large abdominal mass involving the mesentery. He was diagnosed in may 2019 with stage IV disease on the basis of peripheral blood and bone marrow involvement. He was last seen by Dr. Jilda [name] on 06/24/2013. At this time he received Rituxan 600 mg and Decadron 20 mg IV. He's had no major problems with these infusions. He was lost to followup and now reports for visit. The patient is without any new complaints. He denies any abdominal pain any changes in his stools. His weight is stable. He's very active and drives himself to and from his appointments. He has a wife of 71 years. He reports a chronic cough that's nonproductive over the last 3-4 years.  MEDICAL HISTORY: Past Medical History  Diagnosis Date  . HOH (hard of hearing)   . Diverticulosis   . BPH (benign prostatic hyperplasia)   . Congenital malformation of spine     T12  . Hx of hydronephrosis   . NHL (non-Hodgkin's lymphoma)     nhl dx 5/09;  . History of kidney stones 2013  . Cough for last 2 years    occasional white sputum  . Sleep apnea   . Prostate enlargement   . Non-Hodgkin lymphoma     INTERIM HISTORY: has CHRONIC  AIRWAY OBSTRUCTION NEC; ACHALASIA; COUGH; Lymphoma, small lymphocytic; Hydroureteronephrosis; and Bronchiectasis without acute exacerbation on his problem list.    ALLERGIES:  has No Known Allergies.  MEDICATIONS: has a current medication list which includes the following prescription(s): finasteride, fish oil-omega-3 fatty acids, furosemide, glucosamine-chondroitin, hypromellose, lansoprazole, and multiple vitamins-minerals.  SURGICAL HISTORY:  Past Surgical History  Procedure Laterality Date  . Prostate surgery  yrs ago  . Eye surgery  yrs ago    Implants bilateral  . Inguinal hernia repair  yrs ago    left  . Cholecystectomy  yrs ago  . Esophagogastroduodenoscopy (egd) with propofol N/A 07/15/2013    Procedure: ESOPHAGOGASTRODUODENOSCOPY (EGD) WITH PROPOFOL;  Surgeon: Daniel Banister, MD;  Location: [facility];  Service: Endoscopy;  Laterality: N/A;  botox injection  . Botox injection N/A 07/15/2013    Procedure: BOTOX INJECTION;  Surgeon: Daniel Banister, MD;  Location: [facility];  Service: Endoscopy;  Laterality: N/A;   PROBLEM LIST:  1. Small lymphocytic B-cell non-Hodgkin lymphoma presenting with a large abdominal mass in the mesentery. Core needle biopsy on 04/21/2008 showed coexpression of CD5 and CD20, also CD19, CD21, and CD22. CD10 was negative. The patient had involvement of the peripheral blood and bone marrow when he was evaluated at [facility] in early June 2009. The patient initially was treated with Rituxan and fludarabine, 4 cycles from 05/31/2008 through 08/22/2008. The patient had an excellent response to his induction treatment  with Rituxan and fludarabine. Beginning in the fall of 2009, the patient was maintained on Rituxan every 3 months. However, a CT scan of the abdomen and pelvis carried out without IV contrast on 05/12/2012 showed mild progression of mesenteric adenopathy when compared with the CT scan of the abdomen and pelvis  carried out on 04/15/2011. On 05/19/2012, the patient began receiving Rituxan on a monthly basis.  It should be noted that Mr. Daniel [name] underwent hepatitis screening on 10/18/2010. This consisted of hepatitis B surface antibody, hepatitis B surface antigen, hepatitis B core antibody total, and hepatitis core  antibody IgM. All of these were negative. The patient had been receiving monthly Rituxan since 05/19/2012. CT scan of the abdomen and pelvis carried out on 11/13/2012 showed no significant changes, possibly even some improvement. Rituxan is now being given every 2 months as of 12/17/2012.  2. Chronic cough possibly related to GERD.  3. Achalasia.  4. COPD.  5. History of kidney stones.  6. History of elevated PSA.  7. BPH.  8. Hearing impairment.  9. Sun related damage of skin.  10. Diverticulosis.  11. Multiple colonic polyps.  12. Macular degeneration, under the care of Dr. Monna [name].  13. Lithotripsy carried out on an obstructing distal left ureteral stone on 05/18/2012 by Dr. Franchot [name].  REVIEW OF SYSTEMS:   Constitutional: Denies fevers, chills or abnormal weight loss Eyes: Denies blurriness of vision Ears, nose, mouth, throat, and face: Denies mucositis or sore throat Respiratory: Denies cough, dyspnea or wheezes Cardiovascular: Denies palpitation, chest discomfort or lower extremity swelling Gastrointestinal:  Denies nausea, heartburn or change in bowel habits Skin: Denies abnormal skin rashes Lymphatics: Denies new lymphadenopathy or easy bruising Neurological:Denies numbness, tingling or new weaknesses Behavioral/Psych: Mood is stable, no new changes  All other systems were reviewed with the patient and are negative.  PHYSICAL EXAMINATION: ECOG PERFORMANCE STATUS: 0 - Asymptomatic  Blood pressure 135/67, pulse 79, temperature 97.4 F (36.3 C), temperature source Oral, resp. rate 18, height 5\' 5"  (1.651 m), weight 143 lb 14.4 oz (65.273 kg).  GENERAL:alert,  no distress and comfortable; for a pleasant mentally sharp elderly male. SKIN: skin color, texture, turgor are normal, no rashes, large cystic lesion over his central  forehead, unchanged.  EYES: normal, Conjunctiva are pink and non-injected, sclera clear OROPHARYNX:no exudate, no erythema and lips, buccal mucosa, and tongue normal  NECK: supple, thyroid normal size, non-tender, without nodularity LYMPH:  no palpable lymphadenopathy in the cervical, axillary or supraclavicular LUNGS:  inspiratory and expiratory crackling at the bases of both lungs.  HEART: regular rate & rhythm and no murmurs and 1+ extremity edema bilaterally ABDOMEN:abdomen soft, non-tender and normal bowel sounds; large supero-umbilical hernia. Musculoskeletal:no cyanosis of digits and no clubbing  NEURO: alert & oriented x 3 with fluent speech, no focal motor/sensory deficits; he ambulates with a cane.  LABORATORY DATA: Results for orders placed in visit on 12/15/13 (from the past 48 hour(s))  CBC WITH DIFFERENTIAL     Status: Abnormal   Collection Time    12/15/13  8:51 AM      Result Value Range   WBC 7.7  4.0 - 10.3 10e3/uL   NEUT# 4.1  1.5 - 6.5 10e3/uL   HGB 14.1  13.0 - 17.1 g/dL   HCT 43.3  38.4 - 49.9 %   Platelets 165  140 - 400 10e3/uL   MCV 91.0  79.3 - 98.0 fL   MCH 29.6  27.2 - 33.4 pg   MCHC  32.6  32.0 - 36.0 g/dL   RBC 4.76  4.20 - 5.82 10e6/uL   RDW 15.5 (*) 11.0 - 14.6 %   lymph# 2.3  0.9 - 3.3 10e3/uL   MONO# 1.1 (*) 0.1 - 0.9 10e3/uL   Eosinophils Absolute 0.2  0.0 - 0.5 10e3/uL   Basophils Absolute 0.1  0.0 - 0.1 10e3/uL   NEUT% 53.0  39.0 - 75.0 %   LYMPH% 29.8  14.0 - 49.0 %   MONO% 14.1 (*) 0.0 - 14.0 %   EOS% 2.5  0.0 - 7.0 %   BASO% 0.6  0.0 - 2.0 %   nRBC 0  0 - 0 %  COMPREHENSIVE METABOLIC PANEL (0000000)     Status: Abnormal   Collection Time    12/15/13  8:52 AM      Result Value Range   Sodium 141  136 - 145 mEq/L   Potassium 4.3  3.5 - 5.1 mEq/L   Chloride 105  98 - 109  mEq/L   CO2 28  22 - 29 mEq/L   Glucose 92  70 - 140 mg/dl   BUN 17.9  7.0 - 26.0 mg/dL   Creatinine 1.1  0.7 - 1.3 mg/dL   Total Bilirubin 0.95  0.20 - 1.20 mg/dL   Alkaline Phosphatase 131  40 - 150 U/L   AST 26  5 - 34 U/L   ALT 21  0 - 55 U/L   Total Protein 6.2 (*) 6.4 - 8.3 g/dL   Albumin 3.6  3.5 - 5.0 g/dL   Calcium 9.4  8.4 - 10.4 mg/dL   Anion Gap 8  3 - 11 mEq/L  LACTATE DEHYDROGENASE (CC13)     Status: None   Collection Time    12/15/13  8:52 AM      Result Value Range   LDH 216  125 - 245 U/L       Labs:  Lab Results  Component Value Date   WBC 7.7 12/15/2013   HGB 14.1 12/15/2013   HCT 43.3 12/15/2013   MCV 91.0 12/15/2013   PLT 165 12/15/2013   NEUTROABS 4.1 12/15/2013      Chemistry      Component Value Date/Time   NA 141 12/15/2013 0852   NA 139 05/27/2013 1338   NA 142 05/12/2012 0739   K 4.3 12/15/2013 0852   K 4.6 05/27/2013 1338   K 4.5 05/12/2012 0739   CL 104 05/27/2013 1338   CL 103 04/19/2013 0828   CL 100 05/12/2012 0739   CO2 28 12/15/2013 0852   CO2 30 05/27/2013 1338   CO2 30 05/12/2012 0739   BUN 17.9 12/15/2013 0852   BUN 19 05/27/2013 1338   BUN 24* 05/12/2012 0739   CREATININE 1.1 12/15/2013 0852   CREATININE 1.13 05/27/2013 1338   CREATININE 1.6* 05/12/2012 0739      Component Value Date/Time   CALCIUM 9.4 12/15/2013 0852   CALCIUM 9.4 05/27/2013 1338   CALCIUM 8.7 05/12/2012 0739   ALKPHOS 131 12/15/2013 0852   ALKPHOS 151* 05/27/2013 1338   ALKPHOS 109* 05/12/2012 0739   AST 26 12/15/2013 0852   AST 195* 05/27/2013 1338   AST 22 05/12/2012 0739   ALT 21 12/15/2013 0852   ALT 174* 05/27/2013 1338   ALT 27 05/12/2012 0739   BILITOT 0.95 12/15/2013 0852   BILITOT 0.6 05/27/2013 1338   BILITOT 0.80 05/12/2012 0739      CBC:  Recent Labs Lab 12/15/13 [id number]  WBC 7.7  NEUTROABS 4.1  HGB 14.1  HCT 43.3  MCV 91.0  PLT 165    Studies:  No results found.   RADIOGRAPHIC STUDIES: 1. CT scan of the abdomen and pelvis with IV contrast from 03/04/2010 showed central  mesenteric adenopathy. The largest left anterior mesenteric lymph node measures 2.5 x 1.2 cm, felt to be stable in size compared with the prior exam of 03/22/2009. No new adenopathy was noted. There was marked enlargement of the prostate gland with indentation of the urinary bladder base. There is a diverticulum in the right lateral aspect of the urinary bladder, felt to be stable. There was a stable compression fracture of T12 vertebral body. There was patchy airspace disease in the right lower lobe  posteriorly, felt to be due to either infection or inflammatory process.  2. Chest x-ray, 2 view, from 02/15/2011 showed stable chronic bibasilar interstitial disease compared with the chest x-ray of 03/04/2010. No acute findings.  3. CT of the abdomen and pelvis with IV contrast from 04/15/2011 showed progression of peribronchial interstitial thickening within the left and right lower lobes. There was mild interval increase in adenopathy within the central small bowel mesentery. There was increased hydroureter and mild hydronephrosis on the right. A lymph node in the left abdomen measures 18 x 29 mm, similar to 15 x 27 mm on the prior study of 03/04/2010. It was felt that there might be more lymph nodes notable within the mesentery. In the pelvis, there were no enlarged lymph nodes. Again, there was marked enlargement of the prostate gland.  4. CT scan of abdomen and pelvis without IV contrast on 05/12/2012 showed progression of mesenteric adenopathy. Specifically an index node now  measures 4.5 x 2.4 cm on image 24 versus 2.4 x 1.3 cm on the prior CT scan of 04/15/2011. There were scattered colonic diverticula. There was no retrocrural or retroperitoneal adenopathy. There was moderate to marked enlargement of the prostate with some mild bladder wall thickening and a right-sided bladder diverticulum. There was fat-containing ventral abdominal wall hernia. There was development of moderate left-sided  hydroureteronephrosis secondary to a distal left ureteric stone. There was similar right-sided hydroureteronephrosis, felt to be due to prostatic enlargement and bladder outlet obstruction. There was improved appearance at the lung bases. T12 compression deformity was unchanged and there was evidence of prior cholecystectomy. There was a tiny hiatal hernia. 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 Heart size was normal without pericardial or pleural effusion.  5. One-view abdomen on 05/18/2012 showed a 1 cm stone in the distal aspect of the left ureter, felt to be unchanged compared with the abdominal CT scan of 05/12/2012.  6. One-view abdomen on 08/03/2012 showed possible distal left ureteral stone measuring about 6 mm along the left lateral margin of the lower sacrum. There are numerous phleboliths seen in the anatomic pelvis bilaterally, making evaluation for a distal left ureteral stone difficult.  7. Chest x-ray 2 view on 11/13/2012 showed no acute finding. Chronic interstitial change appears stable compared to prior exam from 03/04/2010.  8. CT scan of the abdomen and pelvis with IV contrast On 11/13/2012 showed chronic lower lobe bronchiectasis and peribronchial disease. Stable left-sided mesenteric lymphadenopathy. Stable right-sided hydroureteronephrosis. No obstructing ureteral calculi. Stable bladder diverticulum and enlarged prostate gland. No acute abdominal/pelvic findings.  9. Left lower extremity venous duplex ultrasound carried out on 02/05/2013 showed no evidence of left lower extremity deep venous thrombosis. There was no evidence of superficial thrombophlebitis or abnormal fluid  collection.   ASSESSMENT: Daniel [name] 80 y.o. male with a history of Lymphoma, small lymphocytic - Plan: CBC with Differential, Comprehensive metabolic panel (Cmet) - [facility], Lactate dehydrogenase (LDH) - [facility], CT Abdomen Pelvis Wo Contrast  PLAN:   1. Small lymphocytic lymphoma. --Mr.  Papesh's condition remained stable. His last CT scan of the abdomen and pelvis was carried out on 11/13/2012 and prior to that on 05/12/2012. The left-sided lymphadenopathy remains stable. We will repeat CT scan of the abdomen and pelvis to reassess his disease status. --We will go ahead with another course of treatment with Rituxan 600 mg and Decadron 20 mg IV. --Based on the results of his CT scans, we will determine if we will continue Rituxan every one month versus every 2 months.   2. Follow-up. --Mr. Brevig will return in approximately in one month, at which time we will check CBC and chemistries, and review this his CT scans.   All questions were answered. The patient knows to call the clinic with any problems, questions or concerns. We can certainly see the patient much sooner if necessary.  I spent 15 minutes counseling the patient face to face. The total time spent in the appointment was 25 minutes.    Herbie Lehrmann, MD 12/16/2013 8:26 AM

## 2013-12-15 NOTE — Telephone Encounter (Signed)
Talked to pt and gave him appt for February  lab,MD and chemo

## 2013-12-15 NOTE — Telephone Encounter (Signed)
Per staff message and POF I have scheduled appts.  JMW  

## 2013-12-15 NOTE — Patient Instructions (Signed)

## 2013-12-15 NOTE — Patient Instructions (Signed)
[facility] Discharge Instructions for Patients Receiving Chemotherapy  Today you received the following chemotherapy agents: Rituxan. To help prevent nausea and vomiting after your treatment, we encourage you to take your nausea medication.   If you develop nausea and vomiting that is not controlled by your nausea medication, call the clinic.   BELOW ARE SYMPTOMS THAT SHOULD BE REPORTED IMMEDIATELY:  *FEVER GREATER THAN 100.5 F  *CHILLS WITH OR WITHOUT FEVER  NAUSEA AND VOMITING THAT IS NOT CONTROLLED WITH YOUR NAUSEA MEDICATION  *UNUSUAL SHORTNESS OF BREATH  *UNUSUAL BRUISING OR BLEEDING  TENDERNESS IN MOUTH AND THROAT WITH OR WITHOUT PRESENCE OF ULCERS  *URINARY PROBLEMS  *BOWEL PROBLEMS  UNUSUAL RASH Items with * indicate a potential emergency and should be followed up as soon as possible.  Feel free to call the clinic you have any questions or concerns. The clinic phone number is [phone].    

## 2013-12-15 NOTE — Telephone Encounter (Signed)
gave pt appt for lab and MD, emailed michelle regarding chemo and gave pt oral contrast

## 2013-12-16 ENCOUNTER — Encounter: Payer: Self-pay | Admitting: Internal Medicine

## 2013-12-22 ENCOUNTER — Encounter (HOSPITAL_COMMUNITY): Payer: Self-pay

## 2013-12-22 ENCOUNTER — Ambulatory Visit (HOSPITAL_COMMUNITY)
Admission: RE | Admit: 2013-12-22 | Discharge: 2013-12-22 | Disposition: A | Discharge status: Home / Self Care | Payer: Medicare Other | Source: Ambulatory Visit | Attending: Internal Medicine | Admitting: Internal Medicine

## 2013-12-22 DIAGNOSIS — M949 Disorder of cartilage, unspecified: Secondary | ICD-10-CM

## 2013-12-22 DIAGNOSIS — R599 Enlarged lymph nodes, unspecified: Secondary | ICD-10-CM | POA: Insufficient documentation

## 2013-12-22 DIAGNOSIS — Z79899 Other long term (current) drug therapy: Secondary | ICD-10-CM | POA: Insufficient documentation

## 2013-12-22 DIAGNOSIS — N1330 Unspecified hydronephrosis: Secondary | ICD-10-CM | POA: Insufficient documentation

## 2013-12-22 DIAGNOSIS — N323 Diverticulum of bladder: Secondary | ICD-10-CM | POA: Insufficient documentation

## 2013-12-22 DIAGNOSIS — N401 Enlarged prostate with lower urinary tract symptoms: Secondary | ICD-10-CM | POA: Insufficient documentation

## 2013-12-22 DIAGNOSIS — K838 Other specified diseases of biliary tract: Secondary | ICD-10-CM | POA: Insufficient documentation

## 2013-12-22 DIAGNOSIS — C8300 Small cell B-cell lymphoma, unspecified site: Secondary | ICD-10-CM

## 2013-12-22 DIAGNOSIS — N138 Other obstructive and reflux uropathy: Secondary | ICD-10-CM | POA: Insufficient documentation

## 2013-12-22 DIAGNOSIS — R161 Splenomegaly, not elsewhere classified: Secondary | ICD-10-CM | POA: Insufficient documentation

## 2013-12-22 DIAGNOSIS — K4090 Unilateral inguinal hernia, without obstruction or gangrene, not specified as recurrent: Secondary | ICD-10-CM | POA: Insufficient documentation

## 2013-12-22 DIAGNOSIS — M899 Disorder of bone, unspecified: Secondary | ICD-10-CM | POA: Insufficient documentation

## 2013-12-22 DIAGNOSIS — C8589 Other specified types of non-Hodgkin lymphoma, extranodal and solid organ sites: Secondary | ICD-10-CM | POA: Insufficient documentation

## 2013-12-22 DIAGNOSIS — K5730 Diverticulosis of large intestine without perforation or abscess without bleeding: Secondary | ICD-10-CM | POA: Insufficient documentation

## 2013-12-22 DIAGNOSIS — N139 Obstructive and reflux uropathy, unspecified: Secondary | ICD-10-CM | POA: Insufficient documentation

## 2014-01-13 ENCOUNTER — Other Ambulatory Visit: Payer: Self-pay | Admitting: *Deleted

## 2014-01-14 ENCOUNTER — Ambulatory Visit (HOSPITAL_BASED_OUTPATIENT_CLINIC_OR_DEPARTMENT_OTHER): Payer: Medicare Other | Admitting: Internal Medicine

## 2014-01-14 ENCOUNTER — Telehealth: Payer: Self-pay | Admitting: Internal Medicine

## 2014-01-14 ENCOUNTER — Ambulatory Visit (HOSPITAL_BASED_OUTPATIENT_CLINIC_OR_DEPARTMENT_OTHER): Payer: Medicare Other | Admitting: *Deleted

## 2014-01-14 ENCOUNTER — Ambulatory Visit (HOSPITAL_BASED_OUTPATIENT_CLINIC_OR_DEPARTMENT_OTHER): Payer: Medicare Other

## 2014-01-14 ENCOUNTER — Telehealth: Payer: Self-pay | Admitting: *Deleted

## 2014-01-14 VITALS — BP 126/65 | HR 79 | Temp 96.8°F | Resp 20

## 2014-01-14 VITALS — BP 134/61 | HR 80 | Temp 97.7°F | Resp 18 | Ht 65.0 in | Wt 145.0 lb

## 2014-01-14 DIAGNOSIS — Z5112 Encounter for antineoplastic immunotherapy: Secondary | ICD-10-CM

## 2014-01-14 DIAGNOSIS — C8300 Small cell B-cell lymphoma, unspecified site: Secondary | ICD-10-CM

## 2014-01-14 DIAGNOSIS — C8583 Other specified types of non-Hodgkin lymphoma, intra-abdominal lymph nodes: Secondary | ICD-10-CM

## 2014-01-14 DIAGNOSIS — J479 Bronchiectasis, uncomplicated: Secondary | ICD-10-CM

## 2014-01-14 LAB — COMPREHENSIVE METABOLIC PANEL (CC13)
ALBUMIN: 3.6 g/dL (ref 3.5–5.0)
ALK PHOS: 106 U/L (ref 40–150)
ALT: 22 U/L (ref 0–55)
AST: 26 U/L (ref 5–34)
Anion Gap: 9 mEq/L (ref 3–11)
BUN: 22.4 mg/dL (ref 7.0–26.0)
CO2: 27 mEq/L (ref 22–29)
CREATININE: 1.1 mg/dL (ref 0.7–1.3)
Calcium: 9.5 mg/dL (ref 8.4–10.4)
Chloride: 106 mEq/L (ref 98–109)
Glucose: 104 mg/dl (ref 70–140)
POTASSIUM: 4.3 meq/L (ref 3.5–5.1)
Sodium: 141 mEq/L (ref 136–145)
Total Bilirubin: 0.63 mg/dL (ref 0.20–1.20)
Total Protein: 5.7 g/dL — ABNORMAL LOW (ref 6.4–8.3)

## 2014-01-14 LAB — CBC WITH DIFFERENTIAL/PLATELET
BASO%: 0.7 % (ref 0.0–2.0)
Basophils Absolute: 0.1 10*3/uL (ref 0.0–0.1)
EOS ABS: 0.3 10*3/uL (ref 0.0–0.5)
EOS%: 4 % (ref 0.0–7.0)
HEMATOCRIT: 41.9 % (ref 38.4–49.9)
HEMOGLOBIN: 13.7 g/dL (ref 13.0–17.1)
LYMPH%: 30.5 % (ref 14.0–49.0)
MCH: 29.8 pg (ref 27.2–33.4)
MCHC: 32.7 g/dL (ref 32.0–36.0)
MCV: 91.1 fL (ref 79.3–98.0)
MONO#: 1 10*3/uL — ABNORMAL HIGH (ref 0.1–0.9)
MONO%: 14.3 % — AB (ref 0.0–14.0)
NEUT%: 50.5 % (ref 39.0–75.0)
NEUTROS ABS: 3.5 10*3/uL (ref 1.5–6.5)
PLATELETS: 155 10*3/uL (ref 140–400)
RBC: 4.6 10*6/uL (ref 4.20–5.82)
RDW: 15.4 % — AB (ref 11.0–14.6)
WBC: 6.8 10*3/uL (ref 4.0–10.3)
lymph#: 2.1 10*3/uL (ref 0.9–3.3)

## 2014-01-14 LAB — LACTATE DEHYDROGENASE (CC13): LDH: 223 U/L (ref 125–245)

## 2014-01-14 MED ORDER — DEXAMETHASONE SODIUM PHOSPHATE 20 MG/5ML IJ SOLN
INTRAMUSCULAR | Status: AC
  Filled 2014-01-14: qty 5

## 2014-01-14 MED ORDER — FAMOTIDINE IN NACL 20-0.9 MG/50ML-% IV SOLN
20.0000 mg | Freq: Once | INTRAVENOUS | Status: AC
  Administered 2014-01-14: 20 mg via INTRAVENOUS

## 2014-01-14 MED ORDER — ONDANSETRON 8 MG/50ML IVPB (CHCC)
8.0000 mg | Freq: Once | INTRAVENOUS | Status: AC
  Administered 2014-01-14: 8 mg via INTRAVENOUS

## 2014-01-14 MED ORDER — SODIUM CHLORIDE 0.9 % IV SOLN
375.0000 mg/m2 | Freq: Once | INTRAVENOUS | Status: AC
  Administered 2014-01-14: 600 mg via INTRAVENOUS
  Filled 2014-01-14: qty 60

## 2014-01-14 MED ORDER — ACETAMINOPHEN 325 MG PO TABS
ORAL_TABLET | ORAL | Status: AC
  Filled 2014-01-14: qty 2

## 2014-01-14 MED ORDER — ONDANSETRON 8 MG/NS 50 ML IVPB
INTRAVENOUS | Status: AC
  Filled 2014-01-14: qty 8

## 2014-01-14 MED ORDER — DIPHENHYDRAMINE HCL 25 MG PO CAPS
ORAL_CAPSULE | ORAL | Status: AC
Start: 2014-01-14 — End: 2014-01-14
  Filled 2014-01-14: qty 1

## 2014-01-14 MED ORDER — ACETAMINOPHEN 325 MG PO TABS
650.0000 mg | ORAL_TABLET | Freq: Once | ORAL | Status: AC
  Administered 2014-01-14: 650 mg via ORAL

## 2014-01-14 MED ORDER — DIPHENHYDRAMINE HCL 25 MG PO CAPS
25.0000 mg | ORAL_CAPSULE | Freq: Once | ORAL | Status: AC
Start: 2014-01-14 — End: 2014-01-14
  Administered 2014-01-14: 25 mg via ORAL

## 2014-01-14 MED ORDER — SODIUM CHLORIDE 0.9 % IV SOLN
Freq: Once | INTRAVENOUS | Status: AC
Start: 2014-01-14 — End: 2014-01-14
  Administered 2014-01-14: 10:00:00 via INTRAVENOUS

## 2014-01-14 MED ORDER — DEXAMETHASONE SODIUM PHOSPHATE 10 MG/ML IJ SOLN
20.0000 mg | Freq: Once | INTRAMUSCULAR | Status: AC
  Administered 2014-01-14: 20 mg via INTRAVENOUS

## 2014-01-14 MED ORDER — FAMOTIDINE IN NACL 20-0.9 MG/50ML-% IV SOLN
INTRAVENOUS | Status: AC
  Filled 2014-01-14: qty 50

## 2014-01-14 NOTE — Telephone Encounter (Signed)
Per staff message and POF I have scheduled appts.  JMW  

## 2014-01-14 NOTE — Telephone Encounter (Signed)
Gave pt appt for lab and MSD emailed michelle regarding chemo for March 2015

## 2014-01-14 NOTE — Patient Instructions (Signed)

## 2014-01-14 NOTE — Progress Notes (Signed)
[facility] [place] OFFICE PROGRESS NOTE  [name], MD [place] [address]  DIAGNOSIS: Lymphoma, small lymphocytic - Plan: CBC with Differential, Basic metabolic panel (Bmet) - [facility], Lactate dehydrogenase (LDH) - [facility]  Bronchiectasis without acute exacerbation  Chief Complaint  Patient presents with  . Lymphoma, small lymphocytic    CURRENT THERAPY: -Rituxan 375 mg/meter squared, equal to 600 mg IV, had been administered from 05/19/2012 through 12/17/2012. Starting 12/17/2012, the patient is receiving Rituxan 600 mg IV every 2 months. Decadron 20 mg IV every 2 months with Rituxan.  Last received 06/24/2013.  INTERVAL HISTORY: Daniel [name] 80 y.o. male with a history of small lymphocytic B cell non-Hodgkin's lymphoma is here for followup. His lymphoma presented as a large abdominal mass involving the mesentery. He was diagnosed in may 2019 with stage IV disease on the basis of peripheral blood and bone marrow involvement. He was last seen by Dr. Jilda Roche on 06/24/2013. At this time he received Rituxan 600 mg and Decadron 20 mg IV. He's had no major problems with these infusions. He was lost to followup and now reports for visit. The patient is without any new complaints. He denies any abdominal pain any changes in his stools. His weight is stable. He's very active and drives himself to and from his appointments. He has a wife of 87 years. He reports a chronic cough that's nonproductive over the last 3-4 years.  MEDICAL HISTORY: Past Medical History  Diagnosis Date  . HOH (hard of hearing)   . Diverticulosis   . BPH (benign prostatic hyperplasia)   . Congenital malformation of spine     T12  . Hx of hydronephrosis   . NHL (non-Hodgkin's lymphoma)     nhl dx 5/09;  . History of kidney stones 2013  . Cough for last 2 years    occasional white sputum  . Sleep apnea   . Prostate enlargement   . Non-Hodgkin lymphoma     INTERIM HISTORY: has  CHRONIC AIRWAY OBSTRUCTION NEC; ACHALASIA; COUGH; Lymphoma, small lymphocytic; Hydroureteronephrosis; and Bronchiectasis without acute exacerbation on his problem list.    ALLERGIES:  has No Known Allergies.  MEDICATIONS: has a current medication list which includes the following prescription(s): finasteride, fish oil-omega-3 fatty acids, furosemide, glucosamine-chondroitin, hypromellose, lansoprazole, and multiple vitamins-minerals.  SURGICAL HISTORY:  Past Surgical History  Procedure Laterality Date  . Prostate surgery  yrs ago  . Eye surgery  yrs ago    Implants bilateral  . Inguinal hernia repair  yrs ago    left  . Cholecystectomy  yrs ago  . Esophagogastroduodenoscopy (egd) with propofol N/A 07/15/2013    Procedure: ESOPHAGOGASTRODUODENOSCOPY (EGD) WITH PROPOFOL;  Surgeon: Milus Banister, MD;  Location: [facility];  Service: Endoscopy;  Laterality: N/A;  botox injection  . Botox injection N/A 07/15/2013    Procedure: BOTOX INJECTION;  Surgeon: Milus Banister, MD;  Location: [facility];  Service: Endoscopy;  Laterality: N/A;   PROBLEM LIST:  1. Small lymphocytic B-cell non-Hodgkin lymphoma presenting with a large abdominal mass in the mesentery. Core needle biopsy on 04/21/2008 showed coexpression of CD5 and CD20, also CD19, CD21, and CD22. CD10 was negative. The patient had involvement of the peripheral blood and bone marrow when he was evaluated at [facility] in early June 2009. The patient initially was treated with Rituxan and fludarabine, 4 cycles from 05/31/2008 through 08/22/2008. The patient had an excellent response to his induction treatment  with Rituxan and fludarabine. Beginning in the fall of 2009, the patient was maintained on Rituxan every 3 months. However, a CT scan of the abdomen and pelvis carried out without IV contrast on 05/12/2012 showed mild progression of mesenteric adenopathy when compared with the CT scan of the abdomen and  pelvis carried out on 04/15/2011. On 05/19/2012, the patient began receiving Rituxan on a monthly basis. It should be noted that Mr. Abdou underwent hepatitis screening on 10/18/2010. This consisted of hepatitis B surface antibody, hepatitis B surface antigen, hepatitis B core antibody total, and hepatitis core antibody IgM. All of these were negative. The patient had been receiving monthly Rituxan since 05/19/2012. CT scan of the abdomen and pelvis carried out on 11/13/2012 showed no significant changes, possibly even some improvement. Rituxan is now being given every 2 months as of 12/17/2012.  2. Chronic cough possibly related to GERD.  3. Achalasia.  4. COPD.  5. History of kidney stones.  6. History of elevated PSA.  7. BPH.  8. Hearing impairment.  9. Sun related damage of skin.  10. Diverticulosis.  11. Multiple colonic polyps.  12. Macular degeneration, under the care of Dr. Monna Fam.  13. Lithotripsy carried out on an obstructing distal left ureteral stone on 05/18/2012 by Dr. Franchot Gallo.  REVIEW OF SYSTEMS:   Constitutional: Denies fevers, chills or abnormal weight loss Eyes: Denies blurriness of vision Ears, nose, mouth, throat, and face: Denies mucositis or sore throat Respiratory: Denies cough, dyspnea or wheezes Cardiovascular: Denies palpitation, chest discomfort or lower extremity swelling Gastrointestinal:  Denies nausea, heartburn or change in bowel habits Skin: Denies abnormal skin rashes Lymphatics: Denies new lymphadenopathy or easy bruising Neurological:Denies numbness, tingling or new weaknesses Behavioral/Psych: Mood is stable, no new changes  All other systems were reviewed with the patient and are negative.  PHYSICAL EXAMINATION: ECOG PERFORMANCE STATUS: 0 - Asymptomatic  Blood pressure 134/61, pulse 80, temperature 97.7 F (36.5 C), temperature source Oral, resp. rate 18, height 5\' 5"  (1.651 m), weight 145 lb (65.772 kg), SpO2  97.00%.  GENERAL:alert, no distress and comfortable; for a pleasant mentally sharp elderly male. SKIN: skin color, texture, turgor are normal, no rashes, large cystic lesion over his central  forehead, unchanged.  EYES: normal, Conjunctiva are pink and non-injected, sclera clear OROPHARYNX:no exudate, no erythema and lips, buccal mucosa, and tongue normal  NECK: supple, thyroid normal size, non-tender, without nodularity LYMPH:  no palpable lymphadenopathy in the cervical, axillary or supraclavicular LUNGS:  inspiratory and expiratory crackling at the bases of both lungs.  HEART: regular rate & rhythm and no murmurs and 1+ extremity edema bilaterally ABDOMEN:abdomen soft, non-tender and normal bowel sounds; large supero-umbilical hernia. Musculoskeletal:no cyanosis of digits and no clubbing  NEURO: alert & oriented x 3 with fluent speech, no focal motor/sensory deficits; he ambulates with a cane.  LABORATORY DATA: Results for orders placed in visit on 01/14/14 (from the past 48 hour(s))  CBC WITH DIFFERENTIAL     Status: Abnormal   Collection Time    01/14/14  8:05 AM      Result Value Range   WBC 6.8  4.0 - 10.3 10e3/uL   NEUT# 3.5  1.5 - 6.5 10e3/uL   HGB 13.7  13.0 - 17.1 g/dL   HCT 41.9  38.4 - 49.9 %   Platelets 155  140 - 400 10e3/uL   MCV 91.1  79.3 - 98.0 fL   MCH 29.8  27.2 - 33.4 pg   MCHC 32.7  32.0 - 36.0 g/dL   RBC 4.60  4.20 - 5.82 10e6/uL   RDW 15.4 (*) 11.0 - 14.6 %   lymph# 2.1  0.9 - 3.3 10e3/uL   MONO# 1.0 (*) 0.1 - 0.9 10e3/uL   Eosinophils Absolute 0.3  0.0 - 0.5 10e3/uL   Basophils Absolute 0.1  0.0 - 0.1 10e3/uL   NEUT% 50.5  39.0 - 75.0 %   LYMPH% 30.5  14.0 - 49.0 %   MONO% 14.3 (*) 0.0 - 14.0 %   EOS% 4.0  0.0 - 7.0 %   BASO% 0.7  0.0 - 2.0 %       Labs:  Lab Results  Component Value Date   WBC 6.8 01/14/2014   HGB 13.7 01/14/2014   HCT 41.9 01/14/2014   MCV 91.1 01/14/2014   PLT 155 01/14/2014   NEUTROABS 3.5 01/14/2014      Chemistry       Component Value Date/Time   NA 141 12/15/2013 0852   NA 139 05/27/2013 1338   NA 142 05/12/2012 0739   K 4.3 12/15/2013 0852   K 4.6 05/27/2013 1338   K 4.5 05/12/2012 0739   CL 104 05/27/2013 1338   CL 103 04/19/2013 0828   CL 100 05/12/2012 0739   CO2 28 12/15/2013 0852   CO2 30 05/27/2013 1338   CO2 30 05/12/2012 0739   BUN 17.9 12/15/2013 0852   BUN 19 05/27/2013 1338   BUN 24* 05/12/2012 0739   CREATININE 1.1 12/15/2013 0852   CREATININE 1.13 05/27/2013 1338   CREATININE 1.6* 05/12/2012 0739      Component Value Date/Time   CALCIUM 9.4 12/15/2013 0852   CALCIUM 9.4 05/27/2013 1338   CALCIUM 8.7 05/12/2012 0739   ALKPHOS 131 12/15/2013 0852   ALKPHOS 151* 05/27/2013 1338   ALKPHOS 109* 05/12/2012 0739   AST 26 12/15/2013 0852   AST 195* 05/27/2013 1338   AST 22 05/12/2012 0739   ALT 21 12/15/2013 0852   ALT 174* 05/27/2013 1338   ALT 27 05/12/2012 0739   BILITOT 0.95 12/15/2013 0852   BILITOT 0.6 05/27/2013 1338   BILITOT 0.80 05/12/2012 0739      CBC:  Recent Labs Lab 01/14/14 0805  WBC 6.8  NEUTROABS 3.5  HGB 13.7  HCT 41.9  MCV 91.1  PLT 155    Studies:  No results found.   RADIOGRAPHIC STUDIES: 1. CT scan of the abdomen and pelvis with IV contrast from 03/04/2010 showed central mesenteric adenopathy. The largest left anterior mesenteric lymph node measures 2.5 x 1.2 cm, felt to be stable in size compared with the prior exam of 03/22/2009. No new adenopathy was noted. There was marked enlargement of the prostate gland with indentation of the urinary bladder base. There is a diverticulum in the right lateral aspect of the urinary bladder, felt to be stable. There was a stable compression fracture of T12 vertebral body. There was patchy airspace disease in the right lower lobe  posteriorly, felt to be due to either infection or inflammatory process.  2. Chest x-ray, 2 view, from 02/15/2011 showed stable chronic bibasilar interstitial disease compared with the chest x-ray of 03/04/2010. No acute findings.   3. CT of the abdomen and pelvis with IV contrast from 04/15/2011 showed progression of peribronchial interstitial thickening within the left and right lower lobes. There was mild interval increase in adenopathy within the central small bowel mesentery. There was increased hydroureter and mild hydronephrosis on the right. A lymph node  in the left abdomen measures 18 x 29 mm, similar to 15 x 27 mm on the prior study of 03/04/2010. It was felt that there might be more lymph nodes notable within the mesentery. In the pelvis, there were no enlarged lymph nodes. Again, there was marked enlargement of the prostate gland.  4. CT scan of abdomen and pelvis without IV contrast on 05/12/2012 showed progression of mesenteric adenopathy. Specifically an index node now  measures 4.5 x 2.4 cm on image 24 versus 2.4 x 1.3 cm on the prior CT scan of 04/15/2011. There were scattered colonic diverticula. There was no retrocrural or retroperitoneal adenopathy. There was moderate to marked enlargement of the prostate with some mild bladder wall thickening and a right-sided bladder diverticulum. There was fat-containing ventral abdominal wall hernia. There was development of moderate left-sided hydroureteronephrosis secondary to a distal left ureteric stone. There was similar right-sided hydroureteronephrosis, felt to be due to prostatic enlargement and bladder outlet obstruction. There was improved appearance at the lung bases. T12 compression deformity was unchanged and there was evidence of prior cholecystectomy. There was a tiny hiatal hernia. 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 Heart size was normal without pericardial or pleural effusion.  5. One-view abdomen on 05/18/2012 showed a 1 cm stone in the distal aspect of the left ureter, felt to be unchanged compared with the abdominal CT scan of 05/12/2012.  6. One-view abdomen on 08/03/2012 showed possible distal left ureteral stone measuring about 6 mm along  the left lateral margin of the lower sacrum. There are numerous phleboliths seen in the anatomic pelvis bilaterally, making evaluation for a distal left ureteral stone difficult.  7. Chest x-ray 2 view on 11/13/2012 showed no acute finding. Chronic interstitial change appears stable compared to prior exam from 03/04/2010.  8. CT scan of the abdomen and pelvis with IV contrast On 11/13/2012 showed chronic lower lobe bronchiectasis and peribronchial disease. Stable left-sided mesenteric lymphadenopathy. Stable right-sided hydroureteronephrosis. No obstructing ureteral calculi. Stable bladder diverticulum and enlarged prostate gland. No acute abdominal/pelvic findings.  9. Left lower extremity venous duplex ultrasound carried out on 02/05/2013 showed no evidence of left lower extremity deep venous thrombosis. There was no evidence of superficial thrombophlebitis or abnormal fluid collection. 10. CT of abdomen pelvis on 12/22/2013. Increased mesenteric and upper abdominal lymphadenopathy, consistent with progression of lymphoma. Increased dilatation of common bile duct, which could be due to increased porta hepatis and peripancreatic lymphadenopathy, although choledocholithiasis cannot be excluded. Consider MRCP for further evaluation if clinically warranted. Stable markedly enlarged prostate, findings of chronic bladder outlet obstruction, and severe right hydronephrosis. Stable large right inguinal hernia containing only fat. Severe left-sided diverticulosis. No definite radiographic evidence of acute diverticulitis.   ASSESSMENT: Daniel [name] 80 y.o. male with a history of Lymphoma, small lymphocytic - Plan: CBC with Differential, Basic metabolic panel (Bmet) - [facility], Lactate dehydrogenase (LDH) - [facility]  Bronchiectasis without acute exacerbation  PLAN:   1. Small lymphocytic lymphoma. --Mr. Ponciano's condition remained stable. However, his last CT scan of the abdomen and pelvis was carried showed interval  progression.   --We will go ahead with another course of treatment with Rituxan 600 mg and Decadron 20 mg IV. --Based on the results of his CT scans consistent with progression, we will continue Rituxan every one month. We will repeat scans in 3-4 months or based on symptoms.  Patient was provided a detailed handout on rituximab.   2. Follow-up. --Mr. Kuehner will return in approximately in one  month, at which time we will check CBC and chemistries, and receive rituximab.   All questions were answered. The patient knows to call the clinic with any problems, questions or concerns. We can certainly see the patient much sooner if necessary.  I spent 10 minutes counseling the patient face to face. The total time spent in the appointment was 15 minutes.    Kaine Mcquillen, MD 01/14/2014 8:51 AM

## 2014-01-17 ENCOUNTER — Telehealth: Payer: Self-pay | Admitting: Internal Medicine

## 2014-01-17 NOTE — Telephone Encounter (Signed)
Talked to pt and gave him appt for [facility] 2015

## 2014-02-09 ENCOUNTER — Ambulatory Visit: Payer: Medicare Other

## 2014-02-11 ENCOUNTER — Other Ambulatory Visit: Payer: Self-pay | Admitting: Internal Medicine

## 2014-02-11 ENCOUNTER — Ambulatory Visit (HOSPITAL_BASED_OUTPATIENT_CLINIC_OR_DEPARTMENT_OTHER): Payer: Medicare Other | Admitting: Internal Medicine

## 2014-02-11 ENCOUNTER — Telehealth: Payer: Self-pay | Admitting: *Deleted

## 2014-02-11 ENCOUNTER — Ambulatory Visit: Payer: Medicare Other

## 2014-02-11 ENCOUNTER — Ambulatory Visit (HOSPITAL_BASED_OUTPATIENT_CLINIC_OR_DEPARTMENT_OTHER): Payer: Medicare Other

## 2014-02-11 ENCOUNTER — Encounter: Payer: Self-pay | Admitting: Internal Medicine

## 2014-02-11 ENCOUNTER — Other Ambulatory Visit (HOSPITAL_BASED_OUTPATIENT_CLINIC_OR_DEPARTMENT_OTHER): Payer: Medicare Other

## 2014-02-11 VITALS — BP 87/45 | HR 92 | Temp 98.2°F | Resp 18 | Ht 65.0 in | Wt 142.9 lb

## 2014-02-11 VITALS — BP 97/57 | HR 73 | Temp 97.0°F | Resp 24

## 2014-02-11 DIAGNOSIS — C8583 Other specified types of non-Hodgkin lymphoma, intra-abdominal lymph nodes: Secondary | ICD-10-CM

## 2014-02-11 DIAGNOSIS — M79609 Pain in unspecified limb: Secondary | ICD-10-CM

## 2014-02-11 DIAGNOSIS — Z5112 Encounter for antineoplastic immunotherapy: Secondary | ICD-10-CM

## 2014-02-11 DIAGNOSIS — R05 Cough: Secondary | ICD-10-CM

## 2014-02-11 DIAGNOSIS — C8300 Small cell B-cell lymphoma, unspecified site: Secondary | ICD-10-CM

## 2014-02-11 DIAGNOSIS — L0390 Cellulitis, unspecified: Secondary | ICD-10-CM

## 2014-02-11 DIAGNOSIS — R059 Cough, unspecified: Secondary | ICD-10-CM

## 2014-02-11 DIAGNOSIS — L03113 Cellulitis of right upper limb: Secondary | ICD-10-CM

## 2014-02-11 LAB — CBC WITH DIFFERENTIAL/PLATELET
BASO%: 0.1 % (ref 0.0–2.0)
Basophils Absolute: 0 10*3/uL (ref 0.0–0.1)
EOS%: 0.1 % (ref 0.0–7.0)
Eosinophils Absolute: 0 10*3/uL (ref 0.0–0.5)
HCT: 42.5 % (ref 38.4–49.9)
HEMOGLOBIN: 14.2 g/dL (ref 13.0–17.1)
LYMPH#: 1.3 10*3/uL (ref 0.9–3.3)
LYMPH%: 6.5 % — ABNORMAL LOW (ref 14.0–49.0)
MCH: 30.2 pg (ref 27.2–33.4)
MCHC: 33.4 g/dL (ref 32.0–36.0)
MCV: 90.4 fL (ref 79.3–98.0)
MONO#: 1.9 10*3/uL — AB (ref 0.1–0.9)
MONO%: 9.5 % (ref 0.0–14.0)
NEUT%: 83.8 % — ABNORMAL HIGH (ref 39.0–75.0)
NEUTROS ABS: 16.5 10*3/uL — AB (ref 1.5–6.5)
NRBC: 0 % (ref 0–0)
Platelets: 160 10*3/uL (ref 140–400)
RBC: 4.7 10*6/uL (ref 4.20–5.82)
RDW: 14.9 % — ABNORMAL HIGH (ref 11.0–14.6)
WBC: 19.7 10*3/uL — ABNORMAL HIGH (ref 4.0–10.3)

## 2014-02-11 LAB — BASIC METABOLIC PANEL (CC13)
ANION GAP: 10 meq/L (ref 3–11)
BUN: 30.8 mg/dL — ABNORMAL HIGH (ref 7.0–26.0)
CHLORIDE: 106 meq/L (ref 98–109)
CO2: 25 mEq/L (ref 22–29)
Calcium: 9.6 mg/dL (ref 8.4–10.4)
Creatinine: 1.4 mg/dL — ABNORMAL HIGH (ref 0.7–1.3)
Glucose: 104 mg/dl (ref 70–140)
POTASSIUM: 4.3 meq/L (ref 3.5–5.1)
Sodium: 141 mEq/L (ref 136–145)

## 2014-02-11 LAB — LACTATE DEHYDROGENASE (CC13): LDH: 295 U/L — AB (ref 125–245)

## 2014-02-11 MED ORDER — FAMOTIDINE IN NACL 20-0.9 MG/50ML-% IV SOLN
INTRAVENOUS | Status: AC
  Filled 2014-02-11: qty 50

## 2014-02-11 MED ORDER — DIPHENHYDRAMINE HCL 25 MG PO CAPS
ORAL_CAPSULE | ORAL | Status: AC
  Filled 2014-02-11: qty 1

## 2014-02-11 MED ORDER — SODIUM CHLORIDE 0.9 % IV SOLN
Freq: Once | INTRAVENOUS | Status: AC
  Administered 2014-02-11: 11:00:00 via INTRAVENOUS

## 2014-02-11 MED ORDER — DOXYCYCLINE HYCLATE 100 MG PO CAPS
100.0000 mg | ORAL_CAPSULE | Freq: Once | ORAL | Status: AC
  Administered 2014-02-11: 100 mg via ORAL
  Filled 2014-02-11: qty 1

## 2014-02-11 MED ORDER — SODIUM CHLORIDE 0.9 % IV SOLN
Freq: Once | INTRAVENOUS | Status: AC
  Administered 2014-02-11: 12:00:00 via INTRAVENOUS

## 2014-02-11 MED ORDER — DIPHENHYDRAMINE HCL 25 MG PO CAPS
25.0000 mg | ORAL_CAPSULE | Freq: Once | ORAL | Status: AC
  Administered 2014-02-11: 25 mg via ORAL

## 2014-02-11 MED ORDER — DEXAMETHASONE SODIUM PHOSPHATE 20 MG/5ML IJ SOLN
20.0000 mg | Freq: Once | INTRAMUSCULAR | Status: AC
  Administered 2014-02-11: 20 mg via INTRAVENOUS

## 2014-02-11 MED ORDER — DEXAMETHASONE SODIUM PHOSPHATE 10 MG/ML IJ SOLN: 20.0000 mg | Freq: Once | INTRAMUSCULAR | Status: DC

## 2014-02-11 MED ORDER — ACETAMINOPHEN 325 MG PO TABS
ORAL_TABLET | ORAL | Status: AC
  Filled 2014-02-11: qty 2

## 2014-02-11 MED ORDER — FAMOTIDINE IN NACL 20-0.9 MG/50ML-% IV SOLN
20.0000 mg | Freq: Once | INTRAVENOUS | Status: AC
  Administered 2014-02-11: 20 mg via INTRAVENOUS

## 2014-02-11 MED ORDER — SODIUM CHLORIDE 0.9 % IV SOLN
375.0000 mg/m2 | Freq: Once | INTRAVENOUS | Status: AC
  Administered 2014-02-11: 600 mg via INTRAVENOUS
  Filled 2014-02-11: qty 60

## 2014-02-11 MED ORDER — DEXAMETHASONE SODIUM PHOSPHATE 20 MG/5ML IJ SOLN
INTRAMUSCULAR | Status: AC
  Filled 2014-02-11: qty 5

## 2014-02-11 MED ORDER — ONDANSETRON 8 MG/50ML IVPB (CHCC)
8.0000 mg | Freq: Once | INTRAVENOUS | Status: AC
  Administered 2014-02-11: 8 mg via INTRAVENOUS

## 2014-02-11 MED ORDER — DOXYCYCLINE HYCLATE 100 MG PO TABS: 100.0000 mg | ORAL_TABLET | Freq: Two times a day (BID) | ORAL | Status: DC

## 2014-02-11 MED ORDER — ONDANSETRON 8 MG/NS 50 ML IVPB
INTRAVENOUS | Status: AC
  Filled 2014-02-11: qty 8

## 2014-02-11 MED ORDER — ACETAMINOPHEN 325 MG PO TABS
650.0000 mg | ORAL_TABLET | Freq: Once | ORAL | Status: AC
  Administered 2014-02-11: 650 mg via ORAL

## 2014-02-11 NOTE — Patient Instructions (Signed)
[facility] Discharge Instructions for Patients Receiving Chemotherapy  Today you received the following chemotherapy agent : Rituxan   If you develop nausea and vomiting , call the clinic.   Begin your doxycycline 100 mg twice daily X 10 days (am dose given in treatment area)-has been sent to your pharmacy  BELOW ARE SYMPTOMS THAT SHOULD BE REPORTED IMMEDIATELY:  *FEVER GREATER THAN 100.5 F  *CHILLS WITH OR WITHOUT FEVER  NAUSEA AND VOMITING THAT IS NOT CONTROLLED WITH YOUR NAUSEA MEDICATION  *UNUSUAL SHORTNESS OF BREATH  *UNUSUAL BRUISING OR BLEEDING  TENDERNESS IN MOUTH AND THROAT WITH OR WITHOUT PRESENCE OF ULCERS  *URINARY PROBLEMS  *BOWEL PROBLEMS  UNUSUAL RASH Items with * indicate a potential emergency and should be followed up as soon as possible.  Feel free to call the clinic should you have any questions or concerns. The clinic phone number is [phone].  It has been a pleasure to serve you today!

## 2014-02-11 NOTE — Progress Notes (Signed)
@  21 Notified Dr. Juliann Mule that BP is low pre-Rituxan. Was informed to give 500 cc NS over 2 hours.  @1345  BP is improved. OK to start Rituxan and will continue NS drip while Rituxan going to get additional IV fluids. @1630  Walked with patient to valet service-encouraged him to continue to check his BP daily and let his PCP know if it continues to be low. Urged him to change positions slowly to avoid dizziness or fall. He verbalizes understanding. Reviewed AVS with him also.

## 2014-02-11 NOTE — Telephone Encounter (Signed)
appts made and printed. Pt is aware that tx will be added. i emailed MW to add the tx...td 

## 2014-02-11 NOTE — Patient Instructions (Signed)
Doxycycline tablets or capsules What is this medicine? DOXYCYCLINE (dox i SYE kleen) is a tetracycline antibiotic. It kills certain bacteria or stops their growth. It is used to treat many kinds of infections, like dental, skin, respiratory, and urinary tract infections. It also treats acne, Lyme disease, malaria, and certain sexually transmitted infections. This medicine may be used for other purposes; ask your health care provider or pharmacist if you have questions. COMMON BRAND NAME(S): Adoxa CK, Adoxa Pak, Adoxa TT, Adoxa, Alodox, Avidoxy, Doxal, Monodox, Morgidox 1x Kit, Morgidox 1x, Morgidox 2x , Morgidox 2x Kit, Ocudox , Vibra-Tabs, Vibramycin What should I tell my health care provider before I take this medicine? They need to know if you have any of these conditions: -liver disease -long exposure to sunlight like working outdoors -stomach problems like colitis -an unusual or allergic reaction to doxycycline, tetracycline antibiotics, other medicines, foods, dyes, or preservatives -pregnant or trying to get pregnant -breast-feeding How should I use this medicine? Take this medicine by mouth with a full glass of water. Follow the directions on the prescription label. It is best to take this medicine without food, but if it upsets your stomach take it with food. Take your medicine at regular intervals. Do not take your medicine more often than directed. Take all of your medicine as directed even if you think you are better. Do not skip doses or stop your medicine early. Talk to your pediatrician regarding the use of this medicine in children. Special care may be needed. While this drug may be prescribed for children as young as 37 years old for selected conditions, precautions do apply. Overdosage: If you think you have taken too much of this medicine contact a poison control center or emergency room at once. NOTE: This medicine is only for you. Do not share this medicine with others. What if  I miss a dose? If you miss a dose, take it as soon as you can. If it is almost time for your next dose, take only that dose. Do not take double or extra doses. What may interact with this medicine? -antacids -barbiturates -birth control pills -bismuth subsalicylate -carbamazepine -methoxyflurane -other antibiotics -phenytoin -vitamins that contain iron -warfarin This list may not describe all possible interactions. Give your health care provider a list of all the medicines, herbs, non-prescription drugs, or dietary supplements you use. Also tell them if you smoke, drink alcohol, or use illegal drugs. Some items may interact with your medicine. What should I watch for while using this medicine? Tell your doctor or health care professional if your symptoms do not improve. Do not treat diarrhea with over the counter products. Contact your doctor if you have diarrhea that lasts more than 2 days or if it is severe and watery. Do not take this medicine just before going to bed. It may not dissolve properly when you lay down and can cause pain in your throat. Drink plenty of fluids while taking this medicine to also help reduce irritation in your throat. This medicine can make you more sensitive to the sun. Keep out of the sun. If you cannot avoid being in the sun, wear protective clothing and use sunscreen. Do not use sun lamps or tanning beds/booths. Birth control pills may not work properly while you are taking this medicine. Talk to your doctor about using an extra method of birth control. If you are being treated for a sexually transmitted infection, avoid sexual contact until you have finished your treatment. Your sexual partner  may also need treatment. Avoid antacids, aluminum, calcium, magnesium, and iron products for 4 hours before and 2 hours after taking a dose of this medicine. If you are using this medicine to prevent malaria, you should still protect yourself from contact with mosquitos.  Stay in screened-in areas, use mosquito nets, keep your body covered, and use an insect repellent. What side effects may I notice from receiving this medicine? Side effects that you should report to your doctor or health care professional as soon as possible: -allergic reactions like skin rash, itching or hives, swelling of the face, lips, or tongue -difficulty breathing -fever -itching in the rectal or genital area -pain on swallowing -redness, blistering, peeling or loosening of the skin, including inside the mouth -severe stomach pain or cramps -unusual bleeding or bruising -unusually weak or tired -yellowing of the eyes or skin Side effects that usually do not require medical attention (report to your doctor or health care professional if they continue or are bothersome): -diarrhea -loss of appetite -nausea, vomiting This list may not describe all possible side effects. Call your doctor for medical advice about side effects. You may report side effects to FDA at 1-800-FDA-1088. Where should I keep my medicine? Keep out of the reach of children. Store at room temperature, below 30 degrees C (86 degrees F). Protect from light. Keep container tightly closed. Throw away any unused medicine after the expiration date. Taking this medicine after the expiration date can make you seriously ill. NOTE: This sheet is a summary. It may not cover all possible information. If you have questions about this medicine, talk to your doctor, pharmacist, or health care provider.  2014, [employer]vier/Gold Standard. (2008-03-15 16:53:02) Cellulitis Cellulitis is an infection of the skin and the tissue beneath it. The infected area is usually red and tender. Cellulitis occurs most often in the arms and lower legs.  CAUSES  Cellulitis is caused by bacteria that enter the skin through cracks or cuts in the skin. The most common types of bacteria that cause cellulitis are Staphylococcus and Streptococcus. SYMPTOMS     Redness and warmth.  Swelling.  Tenderness or pain.  Fever. DIAGNOSIS  Your caregiver can usually determine what is wrong based on a physical exam. Blood tests may also be done. TREATMENT  Treatment usually involves taking an antibiotic medicine. HOME CARE INSTRUCTIONS   Take your antibiotics as directed. Finish them even if you start to feel better.  Keep the infected arm or leg elevated to reduce swelling.  Apply a warm cloth to the affected area up to 4 times per day to relieve pain.  Only take over-the-counter or prescription medicines for pain, discomfort, or fever as directed by your caregiver.  Keep all follow-up appointments as directed by your caregiver. SEEK MEDICAL CARE IF:   You notice red streaks coming from the infected area.  Your red area gets larger or turns dark in color.  Your bone or joint underneath the infected area becomes painful after the skin has healed.  Your infection returns in the same area or another area.  You notice a swollen bump in the infected area.  You develop new symptoms. SEEK IMMEDIATE MEDICAL CARE IF:   You have a fever.  You feel very sleepy.  You develop vomiting or diarrhea.  You have a general ill feeling (malaise) with muscle aches and pains. MAKE SURE YOU:   Understand these instructions.  Will watch your condition.  Will get help right away if you are not  doing well or get worse. Document Released: 09/04/2005 Document Revised: 05/26/2012 Document Reviewed: 02/10/2012 [facility] Patient Information 2014 [place].

## 2014-02-11 NOTE — Telephone Encounter (Signed)
Per staff message and POF I have scheduled appts.  JMW  

## 2014-02-13 NOTE — Progress Notes (Signed)
[facility] OFFICE PROGRESS NOTE  Daniel Gravel, MD [place] [address]  DIAGNOSIS: Lymphoma, small lymphocytic - Plan: CBC with Differential, Comprehensive metabolic panel (Cmet) - [facility], Lactate dehydrogenase (LDH) - [facility]  Cellulitis of arm, right - Plan: CBC with Differential, Basic metabolic panel (Bmet) - [facility]  Chief Complaint  Patient presents with  . Lymphoma, small lymphocytic    CURRENT THERAPY: -Rituxan 375 mg/meter squared, equal to 600 mg IV, had been administered from 05/19/2012 through 12/17/2012. Starting 12/17/2012, the patient is receiving Rituxan 600 mg IV every 2 months. Decadron 20 mg IV every 2 months with Rituxan.  Last received 06/24/2013,01/14/2014 .  INTERVAL HISTORY: Daniel [name] 80 y.o. male with a history of small lymphocytic B cell non-Hodgkin's lymphoma is here for followup. His lymphoma presented as a large abdominal mass involving the mesentery. He was diagnosed in may 2019 with stage IV disease on the basis of peripheral blood and bone marrow involvement. He was last seen by me on 01/14/2014. At this time he received Rituxan 600 mg and Decadron 20 mg IV. He's had no major problems with these infusions. He was lost to followup for about six months and presented for follow up in 12/15/2013.  The patient states that he has right arm redness for the past one day or so.  He reports his dog scratched him accidentally.  He denies fevers or chills.. He denies any abdominal pain any changes in his stools. His weight is stable. He's very active and drives himself to and from his appointments. He has a wife of 94 years. He reports a chronic cough that's nonproductive over the last 3-4 years.  MEDICAL HISTORY: Past Medical History  Diagnosis Date  . HOH (hard of hearing)   . Diverticulosis   . BPH (benign prostatic hyperplasia)   . Congenital malformation of spine     T12  . Hx of hydronephrosis   . NHL (non-Hodgkin's  lymphoma)     nhl dx 5/09;  . History of kidney stones 2013  . Cough for last 2 years    occasional white sputum  . Sleep apnea   . Prostate enlargement   . Non-Hodgkin lymphoma     INTERIM HISTORY: has CHRONIC AIRWAY OBSTRUCTION NEC; ACHALASIA; COUGH; Lymphoma, small lymphocytic; Hydroureteronephrosis; Bronchiectasis without acute exacerbation; and Cellulitis of arm, right on his problem list.    ALLERGIES:  has No Known Allergies.  MEDICATIONS: has a current medication list which includes the following prescription(s): finasteride, fish oil-omega-3 fatty acids, furosemide, glucosamine-chondroitin, hypromellose, lansoprazole, multiple vitamins-minerals, and doxycycline.  SURGICAL HISTORY:  Past Surgical History  Procedure Laterality Date  . Prostate surgery  yrs ago  . Eye surgery  yrs ago    Implants bilateral  . Inguinal hernia repair  yrs ago    left  . Cholecystectomy  yrs ago  . Esophagogastroduodenoscopy (egd) with propofol N/A 07/15/2013    Procedure: ESOPHAGOGASTRODUODENOSCOPY (EGD) WITH PROPOFOL;  Surgeon: Daniel Banister, MD;  Location: [facility];  Service: Endoscopy;  Laterality: N/A;  botox injection  . Botox injection N/A 07/15/2013    Procedure: BOTOX INJECTION;  Surgeon: Daniel Banister, MD;  Location: [facility];  Service: Endoscopy;  Laterality: N/A;   PROBLEM LIST:  1. Small lymphocytic B-cell non-Hodgkin lymphoma presenting with a large abdominal mass in the mesentery. Core needle biopsy on 04/21/2008 showed coexpression of CD5 and CD20, also CD19, CD21, and CD22. CD10 was negative. The patient had involvement of  the peripheral blood and bone marrow when he was evaluated at [facility] in early June 2009. The patient initially was treated with Rituxan and fludarabine, 4 cycles from 05/31/2008 through 08/22/2008. The patient had an excellent response to his induction treatment with Rituxan and fludarabine. Beginning in the fall of  2009, the patient was maintained on Rituxan every 3 months. However, a CT scan of the abdomen and pelvis carried out without IV contrast on 05/12/2012 showed mild progression of mesenteric adenopathy when compared with the CT scan of the abdomen and pelvis carried out on 04/15/2011. On 05/19/2012, the patient began receiving Rituxan on a monthly basis. It should be noted that Mr. Daniel [name] underwent hepatitis screening on 10/18/2010. This consisted of hepatitis B surface antibody, hepatitis B surface antigen, hepatitis B core antibody total, and hepatitis core antibody IgM. All of these were negative. The patient had been receiving monthly Rituxan since 05/19/2012. CT scan of the abdomen and pelvis carried out on 11/13/2012 showed no significant changes, possibly even some improvement. Rituxan is now being given every 2 months as of 12/17/2012.  2. Chronic cough possibly related to GERD.  3. Achalasia.  4. COPD.  5. History of kidney stones.  6. History of elevated PSA.  7. BPH.  8. Hearing impairment.  9. Sun related damage of skin.  10. Diverticulosis.  11. Multiple colonic polyps.  12. Macular degeneration, under the care of Dr. Monna [name].  13. Lithotripsy carried out on an obstructing distal left ureteral stone on 05/18/2012 by Dr. Franchot [name].  REVIEW OF SYSTEMS:   Constitutional: Denies fevers, chills or abnormal weight loss Eyes: Denies blurriness of vision Ears, nose, mouth, throat, and face: Denies mucositis or sore throat Respiratory: Denies cough, dyspnea or wheezes Cardiovascular: Denies palpitation, chest discomfort or lower extremity swelling Gastrointestinal:  Denies nausea, heartburn or change in bowel habits Skin: Denies abnormal skin rashes; + r arm redness Lymphatics: Denies new lymphadenopathy or easy bruising Neurological:Denies numbness, tingling or new weaknesses Behavioral/Psych: Mood is stable, no new changes  All other systems were reviewed with the patient  and are negative.  PHYSICAL EXAMINATION: ECOG PERFORMANCE STATUS: 0 - Asymptomatic  Blood pressure 87/45, pulse 92, temperature 98.2 F (36.8 C), temperature source Oral, resp. rate 18, height 5\' 5"  (1.651 m), weight 142 lb 14.4 oz (64.819 kg), SpO2 96.00%.  GENERAL:alert, no distress and comfortable; for a pleasant mentally sharp elderly male. SKIN: skin color, texture, turgor are normal, no rashes, large cystic lesion over his central  forehead, unchanged.  EYES: normal, Conjunctiva are pink and non-injected, sclera clear OROPHARYNX:no exudate, no erythema and lips, buccal mucosa, and tongue normal  NECK: supple, thyroid normal size, non-tender, without nodularity LYMPH:  no palpable lymphadenopathy in the cervical, axillary or supraclavicular LUNGS:  inspiratory and expiratory crackling at the bases of both lungs.  HEART: regular rate & rhythm and no murmurs and 1+ extremity edema bilaterally ABDOMEN:abdomen soft, non-tender and normal bowel sounds; large supero-umbilical hernia. Musculoskeletal:no cyanosis of digits and no clubbing  NEURO: alert & oriented x 3 with fluent speech, no focal motor/sensory deficits; he ambulates with a cane.  Labs:  Lab Results  Component Value Date   WBC 19.7* 02/11/2014   HGB 14.2 02/11/2014   HCT 42.5 02/11/2014   MCV 90.4 02/11/2014   PLT 160 02/11/2014   NEUTROABS 16.5* 02/11/2014      Chemistry      Component Value Date/Time   NA 141 02/11/2014 0848  NA 139 05/27/2013 1338   NA 142 05/12/2012 0739   K 4.3 02/11/2014 0848   K 4.6 05/27/2013 1338   K 4.5 05/12/2012 0739   CL 104 05/27/2013 1338   CL 103 04/19/2013 0828   CL 100 05/12/2012 0739   CO2 25 02/11/2014 0848   CO2 30 05/27/2013 1338   CO2 30 05/12/2012 0739   BUN 30.8* 02/11/2014 0848   BUN 19 05/27/2013 1338   BUN 24* 05/12/2012 0739   CREATININE 1.4* 02/11/2014 0848   CREATININE 1.13 05/27/2013 1338   CREATININE 1.6* 05/12/2012 0739      Component Value Date/Time   CALCIUM 9.6 02/11/2014 0848   CALCIUM  9.4 05/27/2013 1338   CALCIUM 8.7 05/12/2012 0739   ALKPHOS 106 01/14/2014 0806   ALKPHOS 151* 05/27/2013 1338   ALKPHOS 109* 05/12/2012 0739   AST 26 01/14/2014 0806   AST 195* 05/27/2013 1338   AST 22 05/12/2012 0739   ALT 22 01/14/2014 0806   ALT 174* 05/27/2013 1338   ALT 27 05/12/2012 0739   BILITOT 0.63 01/14/2014 0806   BILITOT 0.6 05/27/2013 1338   BILITOT 0.80 05/12/2012 0739      CBC:  Recent Labs Lab 02/11/14 0849  WBC 19.7*  NEUTROABS 16.5*  HGB 14.2  HCT 42.5  MCV 90.4  PLT 160    Studies:  No results found.   RADIOGRAPHIC STUDIES: 1. CT scan of the abdomen and pelvis with IV contrast from 03/04/2010 showed central mesenteric adenopathy. The largest left anterior mesenteric lymph node measures 2.5 x 1.2 cm, felt to be stable in size compared with the prior exam of 03/22/2009. No new adenopathy was noted. There was marked enlargement of the prostate gland with indentation of the urinary bladder base. There is a diverticulum in the right lateral aspect of the urinary bladder, felt to be stable. There was a stable compression fracture of T12 vertebral body. There was patchy airspace disease in the right lower lobe  posteriorly, felt to be due to either infection or inflammatory process.  2. Chest x-ray, 2 view, from 02/15/2011 showed stable chronic bibasilar interstitial disease compared with the chest x-ray of 03/04/2010. No acute findings.  3. CT of the abdomen and pelvis with IV contrast from 04/15/2011 showed progression of peribronchial interstitial thickening within the left and right lower lobes. There was mild interval increase in adenopathy within the central small bowel mesentery. There was increased hydroureter and mild hydronephrosis on the right. A lymph node in the left abdomen measures 18 x 29 mm, similar to 15 x 27 mm on the prior study of 03/04/2010. It was felt that there might be more lymph nodes notable within the mesentery. In the pelvis, there were no enlarged lymph  nodes. Again, there was marked enlargement of the prostate gland.  4. CT scan of abdomen and pelvis without IV contrast on 05/12/2012 showed progression of mesenteric adenopathy. Specifically an index node now  measures 4.5 x 2.4 cm on image 24 versus 2.4 x 1.3 cm on the prior CT scan of 04/15/2011. There were scattered colonic diverticula. There was no retrocrural or retroperitoneal adenopathy. There was moderate to marked enlargement of the prostate with some mild bladder wall thickening and a right-sided bladder diverticulum. There was fat-containing ventral abdominal wall hernia. There was development of moderate left-sided hydroureteronephrosis secondary to a distal left ureteric stone. There was similar right-sided hydroureteronephrosis, felt to be due to prostatic enlargement and bladder outlet obstruction. There was improved appearance at  the lung bases. T12 compression deformity was unchanged and there was evidence of prior cholecystectomy. There was a tiny hiatal hernia. 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 Heart size was normal without pericardial or pleural effusion.  5. One-view abdomen on 05/18/2012 showed a 1 cm stone in the distal aspect of the left ureter, felt to be unchanged compared with the abdominal CT scan of 05/12/2012.  6. One-view abdomen on 08/03/2012 showed possible distal left ureteral stone measuring about 6 mm along the left lateral margin of the lower sacrum. There are numerous phleboliths seen in the anatomic pelvis bilaterally, making evaluation for a distal left ureteral stone difficult.  7. Chest x-ray 2 view on 11/13/2012 showed no acute finding. Chronic interstitial change appears stable compared to prior exam from 03/04/2010.  8. CT scan of the abdomen and pelvis with IV contrast On 11/13/2012 showed chronic lower lobe bronchiectasis and peribronchial disease. Stable left-sided mesenteric lymphadenopathy. Stable right-sided hydroureteronephrosis. No  obstructing ureteral calculi. Stable bladder diverticulum and enlarged prostate gland. No acute abdominal/pelvic findings.  9. Left lower extremity venous duplex ultrasound carried out on 02/05/2013 showed no evidence of left lower extremity deep venous thrombosis. There was no evidence of superficial thrombophlebitis or abnormal fluid collection. 10. CT of abdomen pelvis on 12/22/2013. Increased mesenteric and upper abdominal lymphadenopathy, consistent with progression of lymphoma. Increased dilatation of common bile duct, which could be due to increased porta hepatis and peripancreatic lymphadenopathy, although choledocholithiasis cannot be excluded. Consider MRCP for further evaluation if clinically warranted. Stable markedly enlarged prostate, findings of chronic bladder outlet obstruction, and severe right hydronephrosis. Stable large right inguinal hernia containing only fat. Severe left-sided diverticulosis. No definite radiographic evidence of acute diverticulitis.   ASSESSMENT: Daniel [name] 80 y.o. male with a history of Lymphoma, small lymphocytic - Plan: CBC with Differential, Comprehensive metabolic panel (Cmet) - [facility], Lactate dehydrogenase (LDH) - [facility]  Cellulitis of arm, right - Plan: CBC with Differential, Basic metabolic panel (Bmet) - [facility]  PLAN:   1. Right arm redness concerning for cellulitis. --We will start doxycline 100 mg bid.  First dose will be given in infusion room today.  He will follow up for a close follow up in one week to assess response.  He was counseled to report to ER if swelling and pain despite antibiotics.  He voiced understanding.   2. Small lymphocytic lymphoma. --Mr. Noll's condition remained stable. However, his last CT scan of the abdomen and pelvis was carried showed interval progression.   --We will go ahead with another course of treatment with Rituxan 600 mg and Decadron 20 mg IV. Based on the results of his CT scans consistent with progression, we  will continue Rituxan every one month. We will repeat scans in 3-4 months or based on symptoms.  Patient was provided a detailed handout on rituximab last visit.   3. Follow-up. --Mr. Bruemmer will return in approximately in one week to check his right upper extremity, and in one month at which time we will check CBC and chemistries, and receive rituximab.   All questions were answered. The patient knows to call the clinic with any problems, questions or concerns. We can certainly see the patient much sooner if necessary.  I spent 15 minutes counseling the patient face to face. The total time spent in the appointment was 25 minutes.    Alannis Hsia, MD 02/13/2014 3:28 PM

## 2014-02-18 ENCOUNTER — Other Ambulatory Visit (HOSPITAL_BASED_OUTPATIENT_CLINIC_OR_DEPARTMENT_OTHER): Payer: Medicare Other

## 2014-02-18 DIAGNOSIS — L03113 Cellulitis of right upper limb: Secondary | ICD-10-CM

## 2014-02-18 DIAGNOSIS — C8583 Other specified types of non-Hodgkin lymphoma, intra-abdominal lymph nodes: Secondary | ICD-10-CM

## 2014-02-18 LAB — CBC WITH DIFFERENTIAL/PLATELET
BASO%: 0.5 % (ref 0.0–2.0)
Basophils Absolute: 0 10*3/uL (ref 0.0–0.1)
EOS%: 3.2 % (ref 0.0–7.0)
Eosinophils Absolute: 0.2 10*3/uL (ref 0.0–0.5)
HCT: 44.8 % (ref 38.4–49.9)
HGB: 14.8 g/dL (ref 13.0–17.1)
LYMPH#: 1.7 10*3/uL (ref 0.9–3.3)
LYMPH%: 30.7 % (ref 14.0–49.0)
MCH: 30 pg (ref 27.2–33.4)
MCHC: 33 g/dL (ref 32.0–36.0)
MCV: 90.7 fL (ref 79.3–98.0)
MONO#: 0.9 10*3/uL (ref 0.1–0.9)
MONO%: 15.3 % — AB (ref 0.0–14.0)
NEUT#: 2.8 10*3/uL (ref 1.5–6.5)
NEUT%: 50.3 % (ref 39.0–75.0)
Platelets: 170 10*3/uL (ref 140–400)
RBC: 4.94 10*6/uL (ref 4.20–5.82)
RDW: 14.8 % — AB (ref 11.0–14.6)
WBC: 5.6 10*3/uL (ref 4.0–10.3)
nRBC: 0 % (ref 0–0)

## 2014-02-18 LAB — BASIC METABOLIC PANEL (CC13)
ANION GAP: 10 meq/L (ref 3–11)
BUN: 19.6 mg/dL (ref 7.0–26.0)
CO2: 23 mEq/L (ref 22–29)
Calcium: 9.4 mg/dL (ref 8.4–10.4)
Chloride: 107 mEq/L (ref 98–109)
Creatinine: 1.1 mg/dL (ref 0.7–1.3)
GLUCOSE: 100 mg/dL (ref 70–140)
POTASSIUM: 4.4 meq/L (ref 3.5–5.1)
SODIUM: 140 meq/L (ref 136–145)

## 2014-02-18 LAB — TECHNOLOGIST REVIEW

## 2014-03-11 ENCOUNTER — Other Ambulatory Visit: Payer: Self-pay | Admitting: Hematology & Oncology

## 2014-03-11 ENCOUNTER — Other Ambulatory Visit: Payer: Self-pay | Admitting: Oncology

## 2014-03-11 ENCOUNTER — Ambulatory Visit (HOSPITAL_BASED_OUTPATIENT_CLINIC_OR_DEPARTMENT_OTHER): Payer: Medicare Other

## 2014-03-11 ENCOUNTER — Other Ambulatory Visit: Payer: Self-pay

## 2014-03-11 VITALS — BP 116/52 | HR 67 | Temp 97.6°F | Resp 16

## 2014-03-11 DIAGNOSIS — C8583 Other specified types of non-Hodgkin lymphoma, intra-abdominal lymph nodes: Secondary | ICD-10-CM

## 2014-03-11 DIAGNOSIS — C8300 Small cell B-cell lymphoma, unspecified site: Secondary | ICD-10-CM

## 2014-03-11 DIAGNOSIS — Z5112 Encounter for antineoplastic immunotherapy: Secondary | ICD-10-CM

## 2014-03-11 LAB — CBC WITH DIFFERENTIAL/PLATELET
BASO%: 0.7 % (ref 0.0–2.0)
BASOS ABS: 0 10*3/uL (ref 0.0–0.1)
EOS%: 3.5 % (ref 0.0–7.0)
Eosinophils Absolute: 0.2 10*3/uL (ref 0.0–0.5)
HCT: 42.6 % (ref 38.4–49.9)
HEMOGLOBIN: 13.9 g/dL (ref 13.0–17.1)
LYMPH%: 24.2 % (ref 14.0–49.0)
MCH: 29.6 pg (ref 27.2–33.4)
MCHC: 32.6 g/dL (ref 32.0–36.0)
MCV: 90.6 fL (ref 79.3–98.0)
MONO#: 0.8 10*3/uL (ref 0.1–0.9)
MONO%: 15.3 % — AB (ref 0.0–14.0)
NEUT%: 56.3 % (ref 39.0–75.0)
NEUTROS ABS: 3 10*3/uL (ref 1.5–6.5)
Platelets: 150 10*3/uL (ref 140–400)
RBC: 4.7 10*6/uL (ref 4.20–5.82)
RDW: 15.1 % — AB (ref 11.0–14.6)
WBC: 5.4 10*3/uL (ref 4.0–10.3)
lymph#: 1.3 10*3/uL (ref 0.9–3.3)

## 2014-03-11 LAB — COMPREHENSIVE METABOLIC PANEL (CC13)
ALBUMIN: 3.2 g/dL — AB (ref 3.5–5.0)
ALK PHOS: 115 U/L (ref 40–150)
ALT: 22 U/L (ref 0–55)
AST: 21 U/L (ref 5–34)
Anion Gap: 7 mEq/L (ref 3–11)
BUN: 18.4 mg/dL (ref 7.0–26.0)
CALCIUM: 8.9 mg/dL (ref 8.4–10.4)
CHLORIDE: 109 meq/L (ref 98–109)
CO2: 25 mEq/L (ref 22–29)
Creatinine: 1.1 mg/dL (ref 0.7–1.3)
Glucose: 99 mg/dl (ref 70–140)
POTASSIUM: 4.3 meq/L (ref 3.5–5.1)
SODIUM: 141 meq/L (ref 136–145)
TOTAL PROTEIN: 5.4 g/dL — AB (ref 6.4–8.3)
Total Bilirubin: 0.52 mg/dL (ref 0.20–1.20)

## 2014-03-11 LAB — LACTATE DEHYDROGENASE (CC13): LDH: 175 U/L (ref 125–245)

## 2014-03-11 MED ORDER — ACETAMINOPHEN 325 MG PO TABS
650.0000 mg | ORAL_TABLET | Freq: Once | ORAL | Status: AC
  Administered 2014-03-11: 650 mg via ORAL

## 2014-03-11 MED ORDER — ACETAMINOPHEN 325 MG PO TABS
ORAL_TABLET | ORAL | Status: AC
  Filled 2014-03-11: qty 2

## 2014-03-11 MED ORDER — DEXAMETHASONE SODIUM PHOSPHATE 10 MG/ML IJ SOLN
20.0000 mg | Freq: Once | INTRAMUSCULAR | Status: AC
  Administered 2014-03-11: 20 mg via INTRAVENOUS

## 2014-03-11 MED ORDER — SODIUM CHLORIDE 0.9 % IV SOLN
Freq: Once | INTRAVENOUS | Status: AC
  Administered 2014-03-11: 11:00:00 via INTRAVENOUS

## 2014-03-11 MED ORDER — DIPHENHYDRAMINE HCL 25 MG PO CAPS
25.0000 mg | ORAL_CAPSULE | Freq: Once | ORAL | Status: AC
  Administered 2014-03-11: 25 mg via ORAL

## 2014-03-11 MED ORDER — ONDANSETRON 8 MG/NS 50 ML IVPB
INTRAVENOUS | Status: AC
  Filled 2014-03-11: qty 8

## 2014-03-11 MED ORDER — FAMOTIDINE IN NACL 20-0.9 MG/50ML-% IV SOLN
20.0000 mg | Freq: Once | INTRAVENOUS | Status: AC
  Administered 2014-03-11: 20 mg via INTRAVENOUS

## 2014-03-11 MED ORDER — FAMOTIDINE IN NACL 20-0.9 MG/50ML-% IV SOLN
INTRAVENOUS | Status: AC
  Filled 2014-03-11: qty 50

## 2014-03-11 MED ORDER — ONDANSETRON 8 MG/50ML IVPB (CHCC)
8.0000 mg | Freq: Once | INTRAVENOUS | Status: AC
  Administered 2014-03-11: 8 mg via INTRAVENOUS

## 2014-03-11 MED ORDER — SODIUM CHLORIDE 0.9 % IV SOLN
375.0000 mg/m2 | Freq: Once | INTRAVENOUS | Status: AC
  Administered 2014-03-11: 600 mg via INTRAVENOUS
  Filled 2014-03-11: qty 60

## 2014-03-11 MED ORDER — DIPHENHYDRAMINE HCL 25 MG PO CAPS
ORAL_CAPSULE | ORAL | Status: AC
  Filled 2014-03-11: qty 1

## 2014-03-11 MED ORDER — DEXAMETHASONE SODIUM PHOSPHATE 20 MG/5ML IJ SOLN
INTRAMUSCULAR | Status: AC
  Filled 2014-03-11: qty 5

## 2014-03-11 NOTE — Patient Instructions (Signed)
[facility] Discharge Instructions for Patients Receiving Chemotherapy  Today you received the following chemotherapy agents Rituxan.  To help prevent nausea and vomiting after your treatment, we encourage you to take your nausea medication as prescribed.   If you develop nausea and vomiting that is not controlled by your nausea medication, call the clinic.   BELOW ARE SYMPTOMS THAT SHOULD BE REPORTED IMMEDIATELY:  *FEVER GREATER THAN 100.5 F  *CHILLS WITH OR WITHOUT FEVER  NAUSEA AND VOMITING THAT IS NOT CONTROLLED WITH YOUR NAUSEA MEDICATION  *UNUSUAL SHORTNESS OF BREATH  *UNUSUAL BRUISING OR BLEEDING  TENDERNESS IN MOUTH AND THROAT WITH OR WITHOUT PRESENCE OF ULCERS  *URINARY PROBLEMS  *BOWEL PROBLEMS  UNUSUAL RASH Items with * indicate a potential emergency and should be followed up as soon as possible.  Feel free to call the clinic you have any questions or concerns. The clinic phone number is [phone].    

## 2014-03-14 ENCOUNTER — Other Ambulatory Visit: Payer: Self-pay

## 2014-03-16 ENCOUNTER — Other Ambulatory Visit: Payer: Self-pay

## 2014-03-16 DIAGNOSIS — C8300 Small cell B-cell lymphoma, unspecified site: Secondary | ICD-10-CM

## 2014-03-17 ENCOUNTER — Ambulatory Visit (HOSPITAL_BASED_OUTPATIENT_CLINIC_OR_DEPARTMENT_OTHER): Payer: Medicare Other | Admitting: Internal Medicine

## 2014-03-17 ENCOUNTER — Encounter: Payer: Self-pay | Admitting: Internal Medicine

## 2014-03-17 ENCOUNTER — Other Ambulatory Visit (HOSPITAL_BASED_OUTPATIENT_CLINIC_OR_DEPARTMENT_OTHER): Payer: Medicare Other

## 2014-03-17 VITALS — BP 132/60 | HR 70 | Temp 97.9°F | Resp 20 | Ht 65.0 in | Wt 144.6 lb

## 2014-03-17 DIAGNOSIS — J449 Chronic obstructive pulmonary disease, unspecified: Secondary | ICD-10-CM

## 2014-03-17 DIAGNOSIS — C8583 Other specified types of non-Hodgkin lymphoma, intra-abdominal lymph nodes: Secondary | ICD-10-CM

## 2014-03-17 DIAGNOSIS — C8300 Small cell B-cell lymphoma, unspecified site: Secondary | ICD-10-CM

## 2014-03-17 LAB — CBC WITH DIFFERENTIAL/PLATELET
BASO%: 0.4 % (ref 0.0–2.0)
Basophils Absolute: 0 10*3/uL (ref 0.0–0.1)
EOS%: 1.5 % (ref 0.0–7.0)
Eosinophils Absolute: 0.1 10*3/uL (ref 0.0–0.5)
HCT: 43.2 % (ref 38.4–49.9)
HGB: 14.3 g/dL (ref 13.0–17.1)
LYMPH%: 15.5 % (ref 14.0–49.0)
MCH: 30 pg (ref 27.2–33.4)
MCHC: 33.1 g/dL (ref 32.0–36.0)
MCV: 90.6 fL (ref 79.3–98.0)
MONO#: 0.9 10*3/uL (ref 0.1–0.9)
MONO%: 11.5 % (ref 0.0–14.0)
NEUT#: 5.6 10*3/uL (ref 1.5–6.5)
NEUT%: 71.1 % (ref 39.0–75.0)
Platelets: 163 10*3/uL (ref 140–400)
RBC: 4.77 10*6/uL (ref 4.20–5.82)
RDW: 15.1 % — ABNORMAL HIGH (ref 11.0–14.6)
WBC: 7.9 10*3/uL (ref 4.0–10.3)
lymph#: 1.2 10*3/uL (ref 0.9–3.3)

## 2014-03-17 LAB — COMPREHENSIVE METABOLIC PANEL (CC13)
ALK PHOS: 105 U/L (ref 40–150)
ALT: 17 U/L (ref 0–55)
AST: 20 U/L (ref 5–34)
Albumin: 3.3 g/dL — ABNORMAL LOW (ref 3.5–5.0)
Anion Gap: 7 mEq/L (ref 3–11)
BUN: 19.7 mg/dL (ref 7.0–26.0)
CO2: 26 mEq/L (ref 22–29)
Calcium: 9 mg/dL (ref 8.4–10.4)
Chloride: 109 mEq/L (ref 98–109)
Creatinine: 1.1 mg/dL (ref 0.7–1.3)
Glucose: 105 mg/dl (ref 70–140)
Potassium: 4.3 mEq/L (ref 3.5–5.1)
Sodium: 142 mEq/L (ref 136–145)
Total Bilirubin: 0.52 mg/dL (ref 0.20–1.20)
Total Protein: 5.4 g/dL — ABNORMAL LOW (ref 6.4–8.3)

## 2014-03-17 LAB — LACTATE DEHYDROGENASE (CC13): LDH: 171 U/L (ref 125–245)

## 2014-03-18 ENCOUNTER — Telehealth: Payer: Self-pay | Admitting: Internal Medicine

## 2014-03-18 NOTE — Telephone Encounter (Signed)
s.w.l pt and advised n May appt....adjusted tx time....pt ok and aware

## 2014-03-20 NOTE — Progress Notes (Signed)
[facility] OFFICE PROGRESS NOTE  [name], MD [address] [place] [address]  DIAGNOSIS: Lymphoma, small lymphocytic - Plan: CBC with Differential, Basic metabolic panel (Bmet) - [facility], Lactate dehydrogenase (LDH) - [facility]  Chief Complaint  Patient presents with  . Lymphoma, small lymphocytic    CURRENT THERAPY: -Rituxan 375 mg/meter squared, equal to 600 mg IV, had been administered from 05/19/2012 through 12/17/2012. Starting 12/17/2012, the patient is receiving Rituxan 600 mg IV every 2 months. Decadron 20 mg IV every 2 months with Rituxan.  Last received 06/24/2013,01/14/2014 .  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 He was last seen by me on 02/11/2014.   His lymphoma presented as a large abdominal mass involving the mesentery. He was diagnosed in may 2019 with stage IV disease on the basis of peripheral blood and bone marrow involvement. He was last seen by me on 01/14/2014. At this time he received Rituxan 600 mg and Decadron 20 mg IV. He's had no major problems with these infusions. He was lost to followup for about six months and presented for follow up in 12/15/2013.   The patient states that he has right arm redness for the past one day or so.  He reports his dog scratched him accidentally.  He completed a course of antibiotics with complete resolution. He denies fevers or chills.. He denies any abdominal pain any changes in his stools. His weight is stable. He's very active and drives himself to and from his appointments. He has a wife of 40 years. He reports a chronic cough that's nonproductive over the last 3-4 years.  MEDICAL HISTORY: Past Medical History  Diagnosis Date  . HOH (hard of hearing)   . Diverticulosis   . BPH (benign prostatic hyperplasia)   . Congenital malformation of spine     T12  . Hx of hydronephrosis   . NHL (non-Hodgkin's  lymphoma)     nhl dx 5/09;  . History of kidney stones 2013  . Cough for last 2 years    occasional white sputum  . Sleep apnea   . Prostate enlargement   . Non-Hodgkin lymphoma     INTERIM HISTORY: has CHRONIC AIRWAY OBSTRUCTION NEC; ACHALASIA; COUGH; Lymphoma, small lymphocytic; Hydroureteronephrosis; Bronchiectasis without acute exacerbation; and Cellulitis of arm, right on his problem list.    ALLERGIES:  has No Known Allergies.  MEDICATIONS: has a current medication list which includes the following prescription(s): finasteride, fish oil-omega-3 fatty acids, furosemide, glucosamine-chondroitin, hypromellose, lansoprazole, and multiple vitamins-minerals.  SURGICAL HISTORY:  Past Surgical History  Procedure Laterality Date  . Prostate surgery  yrs ago  . Eye surgery  yrs ago    Implants bilateral  . Inguinal hernia repair  yrs ago    left  . Cholecystectomy  yrs ago  . Esophagogastroduodenoscopy (egd) with propofol N/A 07/15/2013    Procedure: ESOPHAGOGASTRODUODENOSCOPY (EGD) WITH PROPOFOL;  Surgeon: Milus Banister, MD;  Location: [facility];  Service: Endoscopy;  Laterality: N/A;  botox injection  . Botox injection N/A 07/15/2013    Procedure: BOTOX INJECTION;  Surgeon: Milus Banister, MD;  Location: [facility];  Service: Endoscopy;  Laterality: N/A;   PROBLEM LIST:  1. Small lymphocytic B-cell non-Hodgkin lymphoma presenting with a large abdominal mass in the mesentery. Core needle biopsy on 04/21/2008 showed coexpression of CD5 and CD20, also CD19, CD21, and CD22. CD10 was negative. The patient  had involvement of the peripheral blood and bone marrow when he was evaluated at [facility] in early June 2009. The patient initially was treated with Rituxan and fludarabine, 4 cycles from 05/31/2008 through 08/22/2008. The patient had an excellent response to his induction treatment with Rituxan and fludarabine. Beginning in the fall of 2009, the  patient was maintained on Rituxan every 3 months. However, a CT scan of the abdomen and pelvis carried out without IV contrast on 05/12/2012 showed mild progression of mesenteric adenopathy when compared with the CT scan of the abdomen and pelvis carried out on 04/15/2011. On 05/19/2012, the patient began receiving Rituxan on a monthly basis. It should be noted that Mr. Godinho underwent hepatitis screening on 10/18/2010. This consisted of hepatitis B surface antibody, hepatitis B surface antigen, hepatitis B core antibody total, and hepatitis core antibody IgM. All of these were negative. The patient had been receiving monthly Rituxan since 05/19/2012. CT scan of the abdomen and pelvis carried out on 11/13/2012 showed no significant changes, possibly even some improvement. Rituxan is now being given every 2 months as of 12/17/2012.  2. Chronic cough possibly related to GERD.  3. Achalasia.  4. COPD.  5. History of kidney stones.  6. History of elevated PSA.  7. BPH.  8. Hearing impairment.  9. Sun related damage of skin.  10. Diverticulosis.  11. Multiple colonic polyps.  12. Macular degeneration, under the care of Dr. Monna Fam.  13. Lithotripsy carried out on an obstructing distal left ureteral stone on 05/18/2012 by Dr. Franchot Gallo.  REVIEW OF SYSTEMS:   Constitutional: Denies fevers, chills or abnormal weight loss Eyes: Denies blurriness of vision Ears, nose, mouth, throat, and face: Denies mucositis or sore throat Respiratory: Denies cough, dyspnea or wheezes Cardiovascular: Denies palpitation, chest discomfort or lower extremity swelling Gastrointestinal:  Denies nausea, heartburn or change in bowel habits Skin: Denies abnormal skin rashes; + r arm redness Lymphatics: Denies new lymphadenopathy or easy bruising Neurological:Denies numbness, tingling or new weaknesses Behavioral/Psych: Mood is stable, no new changes  All other systems were reviewed with the patient and are  negative.  PHYSICAL EXAMINATION: ECOG PERFORMANCE STATUS: 0 - Asymptomatic  Blood pressure 132/60, pulse 70, temperature 97.9 F (36.6 C), temperature source Oral, resp. rate 20, height 5\' 5"  (1.651 m), weight 144 lb 9.6 oz (65.59 kg), SpO2 98.00%.  GENERAL:alert, no distress and comfortable; for a pleasant mentally sharp elderly male. SKIN: skin color, texture, turgor are normal, no rashes, large cystic lesion over his central  forehead, unchanged.  EYES: normal, Conjunctiva are pink and non-injected, sclera clear OROPHARYNX:no exudate, no erythema and lips, buccal mucosa, and tongue normal  NECK: supple, thyroid normal size, non-tender, without nodularity LYMPH:  no palpable lymphadenopathy in the cervical, axillary or supraclavicular LUNGS:  inspiratory and expiratory crackling at the bases of both lungs.  HEART: regular rate & rhythm and no murmurs and 1+ extremity edema bilaterally ABDOMEN:abdomen soft, non-tender and normal bowel sounds; large supero-umbilical hernia. Musculoskeletal:no cyanosis of digits and no clubbing  NEURO: alert & oriented x 3 with fluent speech, no focal motor/sensory deficits; he ambulates with a cane.  Labs:  Lab Results  Component Value Date   WBC 7.9 03/17/2014   HGB 14.3 03/17/2014   HCT 43.2 03/17/2014   MCV 90.6 03/17/2014   PLT 163 03/17/2014   NEUTROABS 5.6 03/17/2014      Chemistry      Component Value Date/Time   NA 142 03/17/2014  1513   NA 139 05/27/2013 1338   NA 142 05/12/2012 0739   K 4.3 03/17/2014 1513   K 4.6 05/27/2013 1338   K 4.5 05/12/2012 0739   CL 104 05/27/2013 1338   CL 103 04/19/2013 0828   CL 100 05/12/2012 0739   CO2 26 03/17/2014 1513   CO2 30 05/27/2013 1338   CO2 30 05/12/2012 0739   BUN 19.7 03/17/2014 1513   BUN 19 05/27/2013 1338   BUN 24* 05/12/2012 0739   CREATININE 1.1 03/17/2014 1513   CREATININE 1.13 05/27/2013 1338   CREATININE 1.6* 05/12/2012 0739      Component Value Date/Time   CALCIUM 9.0 03/17/2014 1513   CALCIUM 9.4 05/27/2013  1338   CALCIUM 8.7 05/12/2012 0739   ALKPHOS 105 03/17/2014 1513   ALKPHOS 151* 05/27/2013 1338   ALKPHOS 109* 05/12/2012 0739   AST 20 03/17/2014 1513   AST 195* 05/27/2013 1338   AST 22 05/12/2012 0739   ALT 17 03/17/2014 1513   ALT 174* 05/27/2013 1338   ALT 27 05/12/2012 0739   BILITOT 0.52 03/17/2014 1513   BILITOT 0.6 05/27/2013 1338   BILITOT 0.80 05/12/2012 0739      CBC:  Recent Labs Lab 03/17/14 1513  WBC 7.9  NEUTROABS 5.6  HGB 14.3  HCT 43.2  MCV 90.6  PLT 163    Studies:  No results found.   RADIOGRAPHIC STUDIES: 1. CT scan of the abdomen and pelvis with IV contrast from 03/04/2010 showed central mesenteric adenopathy. The largest left anterior mesenteric lymph node measures 2.5 x 1.2 cm, felt to be stable in size compared with the prior exam of 03/22/2009. No new adenopathy was noted. There was marked enlargement of the prostate gland with indentation of the urinary bladder base. There is a diverticulum in the right lateral aspect of the urinary bladder, felt to be stable. There was a stable compression fracture of T12 vertebral body. There was patchy airspace disease in the right lower lobe  posteriorly, felt to be due to either infection or inflammatory process.  2. Chest x-ray, 2 view, from 02/15/2011 showed stable chronic bibasilar interstitial disease compared with the chest x-ray of 03/04/2010. No acute findings.  3. CT of the abdomen and pelvis with IV contrast from 04/15/2011 showed progression of peribronchial interstitial thickening within the left and right lower lobes. There was mild interval increase in adenopathy within the central small bowel mesentery. There was increased hydroureter and mild hydronephrosis on the right. A lymph node in the left abdomen measures 18 x 29 mm, similar to 15 x 27 mm on the prior study of 03/04/2010. It was felt that there might be more lymph nodes notable within the mesentery. In the pelvis, there were no enlarged lymph nodes. Again, there  was marked enlargement of the prostate gland.  4. CT scan of abdomen and pelvis without IV contrast on 05/12/2012 showed progression of mesenteric adenopathy. Specifically an index node now  measures 4.5 x 2.4 cm on image 24 versus 2.4 x 1.3 cm on the prior CT scan of 04/15/2011. There were scattered colonic diverticula. There was no retrocrural or retroperitoneal adenopathy. There was moderate to marked enlargement of the prostate with some mild bladder wall thickening and a right-sided bladder diverticulum. There was fat-containing ventral abdominal wall hernia. There was development of moderate left-sided hydroureteronephrosis secondary to a distal left ureteric stone. There was similar right-sided hydroureteronephrosis, felt to be due to prostatic enlargement and bladder outlet obstruction. There was  improved appearance at the lung bases. T12 compression deformity was unchanged and there was evidence of prior cholecystectomy. There was a tiny hiatal hernia. 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 Heart size was normal without pericardial or pleural effusion.  5. One-view abdomen on 05/18/2012 showed a 1 cm stone in the distal aspect of the left ureter, felt to be unchanged compared with the abdominal CT scan of 05/12/2012.  6. One-view abdomen on 08/03/2012 showed possible distal left ureteral stone measuring about 6 mm along the left lateral margin of the lower sacrum. There are numerous phleboliths seen in the anatomic pelvis bilaterally, making evaluation for a distal left ureteral stone difficult.  7. Chest x-ray 2 view on 11/13/2012 showed no acute finding. Chronic interstitial change appears stable compared to prior exam from 03/04/2010.  8. CT scan of the abdomen and pelvis with IV contrast On 11/13/2012 showed chronic lower lobe bronchiectasis and peribronchial disease. Stable left-sided mesenteric lymphadenopathy. Stable right-sided hydroureteronephrosis. No obstructing ureteral  calculi. Stable bladder diverticulum and enlarged prostate gland. No acute abdominal/pelvic findings.  9. Left lower extremity venous duplex ultrasound carried out on 02/05/2013 showed no evidence of left lower extremity deep venous thrombosis. There was no evidence of superficial thrombophlebitis or abnormal fluid collection. 10. CT of abdomen pelvis on 12/22/2013. Increased mesenteric and upper abdominal lymphadenopathy, consistent with progression of lymphoma. Increased dilatation of common bile duct, which could be due to increased porta hepatis and peripancreatic lymphadenopathy, although choledocholithiasis cannot be excluded. Consider MRCP for further evaluation if clinically warranted. Stable markedly enlarged prostate, findings of chronic bladder outlet obstruction, and severe right hydronephrosis. Stable large right inguinal hernia containing only fat. Severe left-sided diverticulosis. No definite radiographic evidence of acute diverticulitis.   ASSESSMENT: FOTIOS AMOS 80 y.o. male with a history of Lymphoma, small lymphocytic - Plan: CBC with Differential, Basic metabolic panel (Bmet) - [facility], Lactate dehydrogenase (LDH) - [facility]  PLAN:   1. Small lymphocytic lymphoma. --Mr. Ledee's condition remained stable. However, his last CT scan of the abdomen and pelvis was carried showed interval progression.   --We will go ahead with another course of treatment with Rituxan 600 mg and Decadron 20 mg IV. Based on the results of his CT scans consistent with progression, we will continue Rituxan every one month. We will repeat scans in 3-4 months or based on symptoms.  Patient was provided a detailed handout on rituximab last visit.   3. Follow-up. --Mr. Stonehocker will return in one month at which time we will check CBC and chemistries, and receive rituximab.   All questions were answered. The patient knows to call the clinic with any problems, questions or concerns. We can certainly see the patient much  sooner if necessary.  I spent 15 minutes counseling the patient face to face. The total time spent in the appointment was 25 minutes.    Concha Norway, MD 03/20/2014 5:37 PM

## 2014-03-22 ENCOUNTER — Telehealth: Payer: Self-pay | Admitting: Internal Medicine

## 2014-03-22 NOTE — Telephone Encounter (Signed)
imaging study already scheduled. no other orders per 4/12 pof.

## 2014-03-25 ENCOUNTER — Telehealth: Payer: Self-pay | Admitting: Internal Medicine

## 2014-03-25 NOTE — Telephone Encounter (Signed)
Gave pt oral contrast for CT °

## 2014-04-06 ENCOUNTER — Ambulatory Visit (HOSPITAL_COMMUNITY)
Admission: RE | Admit: 2014-04-06 | Discharge: 2014-04-06 | Disposition: A | Discharge status: Home / Self Care | Payer: Medicare Other | Source: Ambulatory Visit | Attending: Internal Medicine | Admitting: Internal Medicine

## 2014-04-06 ENCOUNTER — Encounter (HOSPITAL_COMMUNITY): Payer: Self-pay

## 2014-04-06 ENCOUNTER — Other Ambulatory Visit: Payer: Self-pay | Admitting: Internal Medicine

## 2014-04-06 DIAGNOSIS — C8300 Small cell B-cell lymphoma, unspecified site: Secondary | ICD-10-CM

## 2014-04-06 DIAGNOSIS — C8599 Non-Hodgkin lymphoma, unspecified, extranodal and solid organ sites: Secondary | ICD-10-CM | POA: Insufficient documentation

## 2014-04-06 MED ORDER — IOHEXOL 300 MG/ML  SOLN
100.0000 mL | Freq: Once | INTRAMUSCULAR | Status: AC | PRN
  Administered 2014-04-06: 100 mL via INTRAVENOUS

## 2014-04-08 ENCOUNTER — Other Ambulatory Visit (HOSPITAL_BASED_OUTPATIENT_CLINIC_OR_DEPARTMENT_OTHER): Payer: Medicare Other

## 2014-04-08 ENCOUNTER — Ambulatory Visit (HOSPITAL_BASED_OUTPATIENT_CLINIC_OR_DEPARTMENT_OTHER): Payer: Medicare Other

## 2014-04-08 ENCOUNTER — Telehealth: Payer: Self-pay | Admitting: Internal Medicine

## 2014-04-08 ENCOUNTER — Telehealth: Payer: Self-pay | Admitting: *Deleted

## 2014-04-08 ENCOUNTER — Ambulatory Visit: Payer: Medicare Other

## 2014-04-08 ENCOUNTER — Ambulatory Visit (HOSPITAL_BASED_OUTPATIENT_CLINIC_OR_DEPARTMENT_OTHER): Payer: Medicare Other | Admitting: Internal Medicine

## 2014-04-08 VITALS — BP 98/51 | HR 62 | Temp 98.0°F | Resp 18

## 2014-04-08 VITALS — BP 134/57 | HR 66 | Temp 98.5°F | Resp 17 | Ht 65.0 in | Wt 142.3 lb

## 2014-04-08 DIAGNOSIS — C8300 Small cell B-cell lymphoma, unspecified site: Secondary | ICD-10-CM

## 2014-04-08 DIAGNOSIS — Z5112 Encounter for antineoplastic immunotherapy: Secondary | ICD-10-CM | POA: Diagnosis not present

## 2014-04-08 DIAGNOSIS — C8583 Other specified types of non-Hodgkin lymphoma, intra-abdominal lymph nodes: Secondary | ICD-10-CM

## 2014-04-08 LAB — CBC WITH DIFFERENTIAL/PLATELET
BASO%: 1.1 % (ref 0.0–2.0)
Basophils Absolute: 0.1 10*3/uL (ref 0.0–0.1)
EOS%: 4.4 % (ref 0.0–7.0)
Eosinophils Absolute: 0.3 10*3/uL (ref 0.0–0.5)
HCT: 44.2 % (ref 38.4–49.9)
HEMOGLOBIN: 14.8 g/dL (ref 13.0–17.1)
LYMPH%: 23.1 % (ref 14.0–49.0)
MCH: 30.1 pg (ref 27.2–33.4)
MCHC: 33.5 g/dL (ref 32.0–36.0)
MCV: 90 fL (ref 79.3–98.0)
MONO#: 1 10*3/uL — ABNORMAL HIGH (ref 0.1–0.9)
MONO%: 15.6 % — ABNORMAL HIGH (ref 0.0–14.0)
NEUT#: 3.6 10*3/uL (ref 1.5–6.5)
NEUT%: 55.8 % (ref 39.0–75.0)
Platelets: 167 10*3/uL (ref 140–400)
RBC: 4.91 10*6/uL (ref 4.20–5.82)
RDW: 15.1 % — AB (ref 11.0–14.6)
WBC: 6.4 10*3/uL (ref 4.0–10.3)
lymph#: 1.5 10*3/uL (ref 0.9–3.3)

## 2014-04-08 LAB — BASIC METABOLIC PANEL (CC13)
Anion Gap: 6 mEq/L (ref 3–11)
BUN: 19 mg/dL (ref 7.0–26.0)
CHLORIDE: 110 meq/L — AB (ref 98–109)
CO2: 25 mEq/L (ref 22–29)
Calcium: 9.1 mg/dL (ref 8.4–10.4)
Creatinine: 1.1 mg/dL (ref 0.7–1.3)
Glucose: 130 mg/dl (ref 70–140)
Potassium: 4.7 mEq/L (ref 3.5–5.1)
Sodium: 141 mEq/L (ref 136–145)

## 2014-04-08 LAB — LACTATE DEHYDROGENASE (CC13): LDH: 239 U/L (ref 125–245)

## 2014-04-08 MED ORDER — ONDANSETRON 8 MG/NS 50 ML IVPB
INTRAVENOUS | Status: AC
  Filled 2014-04-08: qty 8

## 2014-04-08 MED ORDER — SODIUM CHLORIDE 0.9 % IV SOLN
375.0000 mg/m2 | Freq: Once | INTRAVENOUS | Status: AC
  Administered 2014-04-08: 600 mg via INTRAVENOUS
  Filled 2014-04-08: qty 60

## 2014-04-08 MED ORDER — DEXAMETHASONE SODIUM PHOSPHATE 10 MG/ML IJ SOLN
20.0000 mg | Freq: Once | INTRAMUSCULAR | Status: AC
  Administered 2014-04-08: 20 mg via INTRAVENOUS

## 2014-04-08 MED ORDER — ONDANSETRON 8 MG/50ML IVPB (CHCC)
8.0000 mg | Freq: Once | INTRAVENOUS | Status: AC
  Administered 2014-04-08: 8 mg via INTRAVENOUS

## 2014-04-08 MED ORDER — ACETAMINOPHEN 325 MG PO TABS
650.0000 mg | ORAL_TABLET | Freq: Once | ORAL | Status: AC
  Administered 2014-04-08: 650 mg via ORAL

## 2014-04-08 MED ORDER — FAMOTIDINE IN NACL 20-0.9 MG/50ML-% IV SOLN
20.0000 mg | Freq: Once | INTRAVENOUS | Status: AC
  Administered 2014-04-08: 20 mg via INTRAVENOUS

## 2014-04-08 MED ORDER — DIPHENHYDRAMINE HCL 25 MG PO CAPS
25.0000 mg | ORAL_CAPSULE | Freq: Once | ORAL | Status: AC
  Administered 2014-04-08: 25 mg via ORAL

## 2014-04-08 MED ORDER — DEXAMETHASONE SODIUM PHOSPHATE 20 MG/5ML IJ SOLN
INTRAMUSCULAR | Status: AC
  Filled 2014-04-08: qty 5

## 2014-04-08 MED ORDER — FAMOTIDINE IN NACL 20-0.9 MG/50ML-% IV SOLN
INTRAVENOUS | Status: AC
  Filled 2014-04-08: qty 50

## 2014-04-08 MED ORDER — SODIUM CHLORIDE 0.9 % IV SOLN
Freq: Once | INTRAVENOUS | Status: AC
Start: 2014-04-08 — End: 2014-04-08
  Administered 2014-04-08: 10:00:00 via INTRAVENOUS

## 2014-04-08 MED ORDER — ACETAMINOPHEN 325 MG PO TABS
ORAL_TABLET | ORAL | Status: AC
  Filled 2014-04-08: qty 2

## 2014-04-08 MED ORDER — DIPHENHYDRAMINE HCL 25 MG PO CAPS
ORAL_CAPSULE | ORAL | Status: AC
  Filled 2014-04-08: qty 1

## 2014-04-08 NOTE — Patient Instructions (Signed)
[facility] Discharge Instructions for Patients Receiving Biotherapy  Today you received the following  Agents: Rituxan.  BELOW ARE SYMPTOMS THAT SHOULD BE REPORTED IMMEDIATELY:  *FEVER GREATER THAN 100.5 F  *CHILLS WITH OR WITHOUT FEVER  NAUSEA AND VOMITING THAT IS NOT CONTROLLED WITH YOUR NAUSEA MEDICATION  *UNUSUAL SHORTNESS OF BREATH  *UNUSUAL BRUISING OR BLEEDING  TENDERNESS IN MOUTH AND THROAT WITH OR WITHOUT PRESENCE OF ULCERS  *URINARY PROBLEMS  *BOWEL PROBLEMS  UNUSUAL RASH Items with * indicate a potential emergency and should be followed up as soon as possible.  Feel free to call the clinic should you have any questions or concerns. The clinic phone number is [phone].

## 2014-04-08 NOTE — Telephone Encounter (Signed)
Per staff message and POF I have scheduled appts.  JMW  

## 2014-04-08 NOTE — Progress Notes (Signed)
[facility] OFFICE PROGRESS NOTE  Daniel Gravel, MD [address] [place] [id number]  DIAGNOSIS: Lymphoma, small lymphocytic  Chief Complaint  Patient presents with  . Follow-up    CURRENT THERAPY: -Rituxan 375 mg/meter squared, equal to 600 mg IV, had been administered from 05/19/2012 through 12/17/2012. Starting 12/17/2012, the patient is receiving Rituxan 600 mg IV every 2 months. Decadron 20 mg IV every 2 months with Rituxan.  Last received 06/24/2013,01/14/2014 .  INTERVAL HISTORY: Daniel [name] 80 y.o. [name] with a history of small lymphocytic B cell non-Hodgkin's lymphoma is here for followup. He was last seen by me on 03/17/2014.   His lymphoma presented as a large abdominal mass involving the mesentery. He was diagnosed in may 2019 with stage IV disease on the basis of peripheral blood and bone marrow involvement.. At this time he received Rituxan 600 mg and Decadron 20 mg IV. He's had no major problems with these infusions. He was lost to followup for about six months and presented for follow up in 12/15/2013.   The patient states that he has right arm redness for the past one day or so.  He reports his dog scratched him accidentally.  He completed a course of antibiotics with complete resolution. He denies fevers or chills.. He denies any abdominal pain any changes in his stools. His weight is stable. He's very active and drives himself to and from his appointments. He has a wife of 49 years. He reports a chronic cough that's nonproductive over the last 3-4 years.  MEDICAL HISTORY: Past Medical History  Diagnosis Date  . HOH (hard of hearing)   . Diverticulosis   . BPH (benign prostatic hyperplasia)   . Congenital malformation of spine     T12  . Hx of hydronephrosis   . NHL (non-Hodgkin's lymphoma)     nhl dx 5/09;  . History of kidney stones 2013  . Cough for last 2 years    occasional white sputum  . Sleep apnea   . Prostate enlargement    . Non-Hodgkin lymphoma     INTERIM HISTORY: has CHRONIC AIRWAY OBSTRUCTION NEC; ACHALASIA; COUGH; Lymphoma, small lymphocytic; Hydroureteronephrosis; Bronchiectasis without acute exacerbation; and Cellulitis of arm, right on his problem list.    ALLERGIES:  has No Known Allergies.  MEDICATIONS: has a current medication list which includes the following prescription(s): finasteride, fish oil-omega-3 fatty acids, furosemide, glucosamine-chondroitin, hypromellose, lansoprazole, and multiple vitamins-minerals.  SURGICAL HISTORY:  Past Surgical History  Procedure Laterality Date  . Prostate surgery  yrs ago  . Eye surgery  yrs ago    Implants bilateral  . Inguinal hernia repair  yrs ago    left  . Cholecystectomy  yrs ago  . Esophagogastroduodenoscopy (egd) with propofol N/A 07/15/2013    Procedure: ESOPHAGOGASTRODUODENOSCOPY (EGD) WITH PROPOFOL;  Surgeon: Daniel Banister, MD;  Location: [facility];  Service: Endoscopy;  Laterality: N/A;  botox injection  . Botox injection N/A 07/15/2013    Procedure: BOTOX INJECTION;  Surgeon: Daniel Banister, MD;  Location: [facility];  Service: Endoscopy;  Laterality: N/A;   PROBLEM LIST:  1. Small lymphocytic B-cell non-Hodgkin lymphoma presenting with a large abdominal mass in the mesentery. Core needle biopsy on 04/21/2008 showed coexpression of CD5 and CD20, also CD19, CD21, and CD22. CD10 was negative. The patient had involvement of the peripheral blood and bone marrow when he was evaluated at [facility] in early June 2009. The patient  initially was treated with Rituxan and fludarabine, 4 cycles from 05/31/2008 through 08/22/2008. The patient had an excellent response to his induction treatment with Rituxan and fludarabine. Beginning in the fall of 2009, the patient was maintained on Rituxan every 3 months. However, a CT scan of the abdomen and pelvis carried out without IV contrast on 05/12/2012 showed mild progression  of mesenteric adenopathy when compared with the CT scan of the abdomen and pelvis carried out on 04/15/2011. On 05/19/2012, the patient began receiving Rituxan on a monthly basis. It should be noted that Daniel [name] underwent hepatitis screening on 10/18/2010. This consisted of hepatitis B surface antibody, hepatitis B surface antigen, hepatitis B core antibody total, and hepatitis core antibody IgM. All of these were negative. The patient had been receiving monthly Rituxan since 05/19/2012. CT scan of the abdomen and pelvis carried out on 11/13/2012 showed no significant changes, possibly even some improvement. Rituxan is now being given every 2 months as of 12/17/2012.  2. Chronic cough possibly related to GERD.  3. Achalasia.  4. COPD.  5. History of kidney stones.  6. History of elevated PSA.  7. BPH.  8. Hearing impairment.  9. Sun related damage of skin.  10. Diverticulosis.  11. Multiple colonic polyps.  12. Macular degeneration, under the care of Dr. Monna [name].  13. Lithotripsy carried out on an obstructing distal left ureteral stone on 05/18/2012 by Dr. Franchot [name].  REVIEW OF SYSTEMS:   Constitutional: Denies fevers, chills or abnormal weight loss Eyes: Denies blurriness of vision Ears, nose, mouth, throat, and face: Denies mucositis or sore throat Respiratory: Denies cough, dyspnea or wheezes Cardiovascular: Denies palpitation, chest discomfort or lower extremity swelling Gastrointestinal:  Denies nausea, heartburn or change in bowel habits Skin: Denies abnormal skin rashes; + r arm redness Lymphatics: Denies new lymphadenopathy or easy bruising Neurological:Denies numbness, tingling or new weaknesses Behavioral/Psych: Mood is stable, no new changes  All other systems were reviewed with the patient and are negative.  PHYSICAL EXAMINATION: ECOG PERFORMANCE STATUS: 0 - Asymptomatic  Blood pressure 134/57, pulse 66, temperature 98.5 F (36.9 C), temperature source  Oral, resp. rate 17, height 5\' 5"  (1.651 m), weight 142 lb 4.8 oz (64.547 kg), SpO2 94.00%.  GENERAL:alert, no distress and comfortable; for a pleasant mentally sharp elderly [name]. HOH. SKIN: skin color, texture, turgor are normal, no rashes, large cystic lesion over his central  forehead, unchanged.  EYES: normal, Conjunctiva are pink and non-injected, sclera clear OROPHARYNX:no exudate, no erythema and lips, buccal mucosa, and tongue normal  NECK: supple, thyroid normal size, non-tender, without nodularity LYMPH:  no palpable lymphadenopathy in the cervical, axillary or supraclavicular LUNGS:  inspiratory and expiratory crackling at the bases of both lungs.  HEART: regular rate & rhythm and no murmurs and 1+ extremity edema bilaterally ABDOMEN:abdomen soft, non-tender and normal bowel sounds; large supero-umbilical hernia. Musculoskeletal:no cyanosis of digits and no clubbing  NEURO: alert & oriented x 3 with fluent speech, no focal motor/sensory deficits; he ambulates with a cane.  Labs:  Lab Results  Component Value Date   WBC 6.4 04/08/2014   HGB 14.8 04/08/2014   HCT 44.2 04/08/2014   MCV 90.0 04/08/2014   PLT 167 04/08/2014   NEUTROABS 3.6 04/08/2014      Chemistry      Component Value Date/Time   NA 142 03/17/2014 1513   NA 139 05/27/2013 1338   NA 142 05/12/2012 0739   K 4.3 03/17/2014 1513   K 4.6 05/27/2013 1338  K 4.5 05/12/2012 0739   CL 104 05/27/2013 1338   CL 103 04/19/2013 0828   CL 100 05/12/2012 0739   CO2 26 03/17/2014 1513   CO2 30 05/27/2013 1338   CO2 30 05/12/2012 0739   BUN 19.7 03/17/2014 1513   BUN 19 05/27/2013 1338   BUN 24* 05/12/2012 0739   CREATININE 1.1 03/17/2014 1513   CREATININE 1.13 05/27/2013 1338   CREATININE 1.6* 05/12/2012 0739      Component Value Date/Time   CALCIUM 9.0 03/17/2014 1513   CALCIUM 9.4 05/27/2013 1338   CALCIUM 8.7 05/12/2012 0739   ALKPHOS 105 03/17/2014 1513   ALKPHOS 151* 05/27/2013 1338   ALKPHOS 109* 05/12/2012 0739   AST 20 03/17/2014 1513   AST 195*  05/27/2013 1338   AST 22 05/12/2012 0739   ALT 17 03/17/2014 1513   ALT 174* 05/27/2013 1338   ALT 27 05/12/2012 0739   BILITOT 0.52 03/17/2014 1513   BILITOT 0.6 05/27/2013 1338   BILITOT 0.80 05/12/2012 0739      CBC:  Recent Labs Lab 04/08/14 0837  WBC 6.4  NEUTROABS 3.6  HGB 14.8  HCT 44.2  MCV 90.0  PLT 167    Studies:  No results found.   RADIOGRAPHIC STUDIES: 1. CT scan of the abdomen and pelvis with IV contrast from 03/04/2010 showed central mesenteric adenopathy. The largest left anterior mesenteric lymph node measures 2.5 x 1.2 cm, felt to be stable in size compared with the prior exam of 03/22/2009. No new adenopathy was noted. There was marked enlargement of the prostate gland with indentation of the urinary bladder base. There is a diverticulum in the right lateral aspect of the urinary bladder, felt to be stable. There was a stable compression fracture of T12 vertebral body. There was patchy airspace disease in the right lower lobe  posteriorly, felt to be due to either infection or inflammatory process.  2. Chest x-ray, 2 view, from 02/15/2011 showed stable chronic bibasilar interstitial disease compared with the chest x-ray of 03/04/2010. No acute findings.  3. CT of the abdomen and pelvis with IV contrast from 04/15/2011 showed progression of peribronchial interstitial thickening within the left and right lower lobes. There was mild interval increase in adenopathy within the central small bowel mesentery. There was increased hydroureter and mild hydronephrosis on the right. A lymph node in the left abdomen measures 18 x 29 mm, similar to 15 x 27 mm on the prior study of 03/04/2010. It was felt that there might be more lymph nodes notable within the mesentery. In the pelvis, there were no enlarged lymph nodes. Again, there was marked enlargement of the prostate gland.  4. CT scan of abdomen and pelvis without IV contrast on 05/12/2012 showed progression of mesenteric adenopathy.  Specifically an index node now  measures 4.5 x 2.4 cm on image 24 versus 2.4 x 1.3 cm on the prior CT scan of 04/15/2011. There were scattered colonic diverticula. There was no retrocrural or retroperitoneal adenopathy. There was moderate to marked enlargement of the prostate with some mild bladder wall thickening and a right-sided bladder diverticulum. There was fat-containing ventral abdominal wall hernia. There was development of moderate left-sided hydroureteronephrosis secondary to a distal left ureteric stone. There was similar right-sided hydroureteronephrosis, felt to be due to prostatic enlargement and bladder outlet obstruction. There was improved appearance at the lung bases. T12 compression deformity was unchanged and there was evidence of prior cholecystectomy. There was a tiny hiatal hernia. 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 Heart size was normal without pericardial or pleural effusion.  5. One-view abdomen on 05/18/2012 showed a 1 cm stone in the distal aspect of the left ureter, felt to be unchanged compared with the abdominal CT scan of 05/12/2012.  6. One-view abdomen on 08/03/2012 showed possible distal left ureteral stone measuring about 6 mm along the left lateral margin of the lower sacrum. There are numerous phleboliths seen in the anatomic pelvis bilaterally, making evaluation for a distal left ureteral stone difficult.  7. Chest x-ray 2 view on 11/13/2012 showed no acute finding. Chronic interstitial change appears stable compared to prior exam from 03/04/2010.  8. CT scan of the abdomen and pelvis with IV contrast On 11/13/2012 showed chronic lower lobe bronchiectasis and peribronchial disease. Stable left-sided mesenteric lymphadenopathy. Stable right-sided hydroureteronephrosis. No obstructing ureteral calculi. Stable bladder diverticulum and enlarged prostate gland. No acute abdominal/pelvic findings.  9. Left lower extremity venous duplex ultrasound carried  out on 02/05/2013 showed no evidence of left lower extremity deep venous thrombosis. There was no evidence of superficial thrombophlebitis or abnormal fluid collection. 10. CT of abdomen pelvis on 12/22/2013. Increased mesenteric and upper abdominal lymphadenopathy, consistent with progression of lymphoma. Increased dilatation of common bile duct, which could be due to increased porta hepatis and peripancreatic lymphadenopathy, although choledocholithiasis cannot be excluded. Consider MRCP for further evaluation if clinically warranted. Stable markedly enlarged prostate, findings of chronic bladder outlet obstruction, and severe right hydronephrosis. Stable large right inguinal hernia containing only fat. Severe left-sided diverticulosis. No definite radiographic evidence of acute diverticulitis.  11. CT of abdomen pelvis on 04/06/2014. Stable upper abdominal/mesenteric lymphadenopathy, as above. Severe right hydroureteronephrosis, unchanged. Prostatomegaly. Status post cholecystectomy with stable intrahepatic/extrahepatic ductal dilatation, likely postsurgical. Additional stable ancillary findings above.  ASSESSMENT: Daniel [name] 80 y.o. [name] with a history of Lymphoma, small lymphocytic  PLAN:   1. Small lymphocytic lymphoma. --Daniel [name]'s condition remained stable. However, his last CT scan of the abdomen and pelvis demonstrated stable disease.   --We will go ahead with another course of treatment with Rituxan 600 mg and Decadron 20 mg IV. We will continue Rituxan every one month. We will repeat scans in 3-4 months or based on symptoms.  Patient was provided a detailed handout on rituximab last visit.   . Follow-up. --Daniel [name] will return in one month at which time we will check CBC and chemistries, and receive rituximab.   All questions were answered. The patient knows to call the clinic with any problems, questions or concerns. We can certainly see the patient much sooner if necessary.  I  spent 10 minutes counseling the patient face to face. The total time spent in the appointment was 15 minutes.    Concha Norway, MD 04/08/2014 9:42 AM

## 2014-04-08 NOTE — Telephone Encounter (Signed)
gve the pt his June 2015 appt calendar. Sent michelle a staff message to add the rituaxin tx on the same day.

## 2014-05-10 ENCOUNTER — Telehealth: Payer: Self-pay | Admitting: Internal Medicine

## 2014-05-10 ENCOUNTER — Ambulatory Visit (HOSPITAL_BASED_OUTPATIENT_CLINIC_OR_DEPARTMENT_OTHER): Payer: Medicare Other

## 2014-05-10 ENCOUNTER — Other Ambulatory Visit (HOSPITAL_BASED_OUTPATIENT_CLINIC_OR_DEPARTMENT_OTHER): Payer: Medicare Other

## 2014-05-10 ENCOUNTER — Other Ambulatory Visit: Payer: Self-pay | Admitting: Internal Medicine

## 2014-05-10 ENCOUNTER — Ambulatory Visit (HOSPITAL_BASED_OUTPATIENT_CLINIC_OR_DEPARTMENT_OTHER): Payer: Medicare Other | Admitting: Internal Medicine

## 2014-05-10 VITALS — BP 139/56 | HR 74 | Temp 97.5°F | Resp 18 | Ht 65.0 in | Wt 143.6 lb

## 2014-05-10 VITALS — BP 114/67 | HR 66 | Temp 97.5°F | Resp 18

## 2014-05-10 DIAGNOSIS — Z5112 Encounter for antineoplastic immunotherapy: Secondary | ICD-10-CM

## 2014-05-10 DIAGNOSIS — C8583 Other specified types of non-Hodgkin lymphoma, intra-abdominal lymph nodes: Secondary | ICD-10-CM

## 2014-05-10 DIAGNOSIS — C8300 Small cell B-cell lymphoma, unspecified site: Secondary | ICD-10-CM

## 2014-05-10 LAB — CBC WITH DIFFERENTIAL/PLATELET
BASO%: 1.2 % (ref 0.0–2.0)
Basophils Absolute: 0.1 10*3/uL (ref 0.0–0.1)
EOS%: 2.8 % (ref 0.0–7.0)
Eosinophils Absolute: 0.2 10*3/uL (ref 0.0–0.5)
HEMATOCRIT: 41 % (ref 38.4–49.9)
HGB: 13.6 g/dL (ref 13.0–17.1)
LYMPH%: 23.7 % (ref 14.0–49.0)
MCH: 30.1 pg (ref 27.2–33.4)
MCHC: 33.1 g/dL (ref 32.0–36.0)
MCV: 90.7 fL (ref 79.3–98.0)
MONO#: 0.9 10*3/uL (ref 0.1–0.9)
MONO%: 12.7 % (ref 0.0–14.0)
NEUT#: 4 10*3/uL (ref 1.5–6.5)
NEUT%: 59.6 % (ref 39.0–75.0)
PLATELETS: 170 10*3/uL (ref 140–400)
RBC: 4.52 10*6/uL (ref 4.20–5.82)
RDW: 15 % — ABNORMAL HIGH (ref 11.0–14.6)
WBC: 6.7 10*3/uL (ref 4.0–10.3)
lymph#: 1.6 10*3/uL (ref 0.9–3.3)

## 2014-05-10 LAB — COMPREHENSIVE METABOLIC PANEL (CC13)
ALK PHOS: 94 U/L (ref 40–150)
ALT: 12 U/L (ref 0–55)
ANION GAP: 11 meq/L (ref 3–11)
AST: 18 U/L (ref 5–34)
Albumin: 3.3 g/dL — ABNORMAL LOW (ref 3.5–5.0)
BILIRUBIN TOTAL: 0.66 mg/dL (ref 0.20–1.20)
BUN: 20.5 mg/dL (ref 7.0–26.0)
CO2: 22 mEq/L (ref 22–29)
CREATININE: 1.2 mg/dL (ref 0.7–1.3)
Calcium: 8.8 mg/dL (ref 8.4–10.4)
Chloride: 107 mEq/L (ref 98–109)
Glucose: 100 mg/dl (ref 70–140)
Potassium: 4.1 mEq/L (ref 3.5–5.1)
Sodium: 141 mEq/L (ref 136–145)
Total Protein: 5.4 g/dL — ABNORMAL LOW (ref 6.4–8.3)

## 2014-05-10 LAB — LACTATE DEHYDROGENASE (CC13): LDH: 204 U/L (ref 125–245)

## 2014-05-10 MED ORDER — RITUXIMAB CHEMO INJECTION 10 MG/ML
375.0000 mg/m2 | Freq: Once | INTRAVENOUS | Status: AC
  Administered 2014-05-10: 600 mg via INTRAVENOUS
  Filled 2014-05-10: qty 60

## 2014-05-10 MED ORDER — DEXAMETHASONE SODIUM PHOSPHATE 10 MG/ML IJ SOLN: 20.0000 mg | Freq: Once | INTRAMUSCULAR | Status: DC

## 2014-05-10 MED ORDER — ONDANSETRON 8 MG/50ML IVPB (CHCC)
8.0000 mg | Freq: Once | INTRAVENOUS | Status: AC
  Administered 2014-05-10: 8 mg via INTRAVENOUS

## 2014-05-10 MED ORDER — ACETAMINOPHEN 325 MG PO TABS
ORAL_TABLET | ORAL | Status: AC
  Filled 2014-05-10: qty 2

## 2014-05-10 MED ORDER — ONDANSETRON 8 MG/NS 50 ML IVPB
INTRAVENOUS | Status: AC
  Filled 2014-05-10: qty 8

## 2014-05-10 MED ORDER — FAMOTIDINE IN NACL 20-0.9 MG/50ML-% IV SOLN
INTRAVENOUS | Status: AC
  Filled 2014-05-10: qty 50

## 2014-05-10 MED ORDER — DEXAMETHASONE SODIUM PHOSPHATE 20 MG/5ML IJ SOLN
INTRAMUSCULAR | Status: AC
  Filled 2014-05-10: qty 5

## 2014-05-10 MED ORDER — ACETAMINOPHEN 325 MG PO TABS
650.0000 mg | ORAL_TABLET | Freq: Once | ORAL | Status: AC
  Administered 2014-05-10: 650 mg via ORAL

## 2014-05-10 MED ORDER — DIPHENHYDRAMINE HCL 25 MG PO CAPS
ORAL_CAPSULE | ORAL | Status: AC
  Filled 2014-05-10: qty 1

## 2014-05-10 MED ORDER — SODIUM CHLORIDE 0.9 % IV SOLN
Freq: Once | INTRAVENOUS | Status: AC
  Administered 2014-05-10: 10:00:00 via INTRAVENOUS

## 2014-05-10 MED ORDER — DEXAMETHASONE SODIUM PHOSPHATE 20 MG/5ML IJ SOLN
20.0000 mg | Freq: Once | INTRAMUSCULAR | Status: AC
  Administered 2014-05-10: 20 mg via INTRAVENOUS

## 2014-05-10 MED ORDER — FAMOTIDINE IN NACL 20-0.9 MG/50ML-% IV SOLN
20.0000 mg | Freq: Once | INTRAVENOUS | Status: AC
  Administered 2014-05-10: 20 mg via INTRAVENOUS

## 2014-05-10 MED ORDER — DIPHENHYDRAMINE HCL 25 MG PO CAPS
25.0000 mg | ORAL_CAPSULE | Freq: Once | ORAL | Status: AC
  Administered 2014-05-10: 25 mg via ORAL

## 2014-05-10 NOTE — Patient Instructions (Signed)

## 2014-05-10 NOTE — Telephone Encounter (Signed)
NO POF - VM LEFT FOR DESK NURSE TO HAVE POF SENT. PT GIVEN APPT SCHEDULE FOR JULY. BASED ON LABS ORDERS ENTERED TODAY FOR 7/2 - OFFICE NOTE AND PT APPT HX OF MTHLY LB/FU/INF PT GIVEN LB/FU/INF APPTS FOR 7/2.

## 2014-05-10 NOTE — Progress Notes (Signed)
[place] OFFICE PROGRESS NOTE  [name], MD [address] [place] [address]  DIAGNOSIS: Lymphoma, small lymphocytic - Plan: CBC with Differential, Comprehensive metabolic panel (Cmet) - [facility], Lactate dehydrogenase (LDH) - [facility]  Chief Complaint  Patient presents with  . Follow-up    PRIOR THERAPY: -Rituxan 375 mg/meter squared, equal to 600 mg IV, had been administered from 05/19/2012 through 12/17/2012. Starting 12/17/2012, the patient is receiving Rituxan 600 mg IV every 2 months. Decadron 20 mg IV every 2 months with Rituxan.  Last received 06/24/2013,01/14/2014 .   CURRENT THERAPY: -Rituxan 375 mg/meter squared plsu decadron 20 mg IV every one month since 12/15/2013.  INTERVAL HISTORY: Daniel [name] 80 y.o. male with a history of small lymphocytic B cell non-Hodgkin's lymphoma is here for followup. He was last seen by me on 04/08/2014.   His lymphoma presented as a large abdominal mass involving the mesentery. He was diagnosed in may 2019 with stage IV disease on the basis of peripheral blood and bone marrow involvement.. At this time he received Rituxan 600 mg and Decadron 20 mg IV. He's had no major problems with these infusions. He was lost to followup for about six months and presented for follow up in 12/15/2013.   The patient states that he has improved energy. He denies any abdominal pain any changes in his stools. His weight is stable. He's very active and drives himself to and from his appointments. He has a wife of 48 years. He reports a chronic cough that's nonproductive over the last 3-4 years.  MEDICAL HISTORY: Past Medical History  Diagnosis Date  . HOH (hard of hearing)   . Diverticulosis   . BPH (benign prostatic hyperplasia)   . Congenital malformation of spine     T12  . Hx of hydronephrosis   . NHL (non-Hodgkin's lymphoma)     nhl dx 5/09;  . History of kidney stones 2013  . Cough for last 2 years    occasional white  sputum  . Sleep apnea   . Prostate enlargement   . Non-Hodgkin lymphoma     INTERIM HISTORY: has CHRONIC AIRWAY OBSTRUCTION NEC; ACHALASIA; COUGH; Lymphoma, small lymphocytic; Hydroureteronephrosis; Bronchiectasis without acute exacerbation; and Cellulitis of arm, right on his problem list.    ALLERGIES:  has No Known Allergies.  MEDICATIONS: has a current medication list which includes the following prescription(s): finasteride, fish oil-omega-3 fatty acids, furosemide, glucosamine-chondroitin, hypromellose, lansoprazole, and multiple vitamins-minerals.  SURGICAL HISTORY:  Past Surgical History  Procedure Laterality Date  . Prostate surgery  yrs ago  . Eye surgery  yrs ago    Implants bilateral  . Inguinal hernia repair  yrs ago    left  . Cholecystectomy  yrs ago  . Esophagogastroduodenoscopy (egd) with propofol N/A 07/15/2013    Procedure: ESOPHAGOGASTRODUODENOSCOPY (EGD) WITH PROPOFOL;  Surgeon: Milus Banister, MD;  Location: [facility];  Service: Endoscopy;  Laterality: N/A;  botox injection  . Botox injection N/A 07/15/2013    Procedure: BOTOX INJECTION;  Surgeon: Milus Banister, MD;  Location: [facility];  Service: Endoscopy;  Laterality: N/A;   PROBLEM LIST:  1. Small lymphocytic B-cell non-Hodgkin lymphoma presenting with a large abdominal mass in the mesentery. Core needle biopsy on 04/21/2008 showed coexpression of CD5 and CD20, also CD19, CD21, and CD22. CD10 was negative. The patient had involvement of the peripheral blood and bone marrow when he was evaluated at [facility] in early June  2009. The patient initially was treated with Rituxan and fludarabine, 4 cycles from 05/31/2008 through 08/22/2008. The patient had an excellent response to his induction treatment with Rituxan and fludarabine. Beginning in the fall of 2009, the patient was maintained on Rituxan every 3 months. However, a CT scan of the abdomen and pelvis carried out without  IV contrast on 05/12/2012 showed mild progression of mesenteric adenopathy when compared with the CT scan of the abdomen and pelvis carried out on 04/15/2011. On 05/19/2012, the patient began receiving Rituxan on a monthly basis. It should be noted that Daniel [name] underwent hepatitis screening on 10/18/2010. This consisted of hepatitis B surface antibody, hepatitis B surface antigen, hepatitis B core antibody total, and hepatitis core antibody IgM. All of these were negative. The patient had been receiving monthly Rituxan since 05/19/2012. CT scan of the abdomen and pelvis carried out on 11/13/2012 showed no significant changes, possibly even some improvement. Rituxan is now being given every 2 months as of 12/17/2012.  2. Chronic cough possibly related to GERD.  3. Achalasia.  4. COPD.  5. History of kidney stones.  6. History of elevated PSA.  7. BPH.  8. Hearing impairment.  9. Sun related damage of skin.  10. Diverticulosis.  11. Multiple colonic polyps.  12. Macular degeneration, under the care of Dr. Monna Fam.  13. Lithotripsy carried out on an obstructing distal left ureteral stone on 05/18/2012 by Dr. Franchot Gallo.  REVIEW OF SYSTEMS:   Constitutional: Denies fevers, chills or abnormal weight loss Eyes: Denies blurriness of vision Ears, nose, mouth, throat, and face: Denies mucositis or sore throat Respiratory: Denies cough, dyspnea or wheezes Cardiovascular: Denies palpitation, chest discomfort or lower extremity swelling Gastrointestinal:  Denies nausea, heartburn or change in bowel habits Skin: Denies abnormal skin rashes; + r arm redness Lymphatics: Denies new lymphadenopathy or easy bruising Neurological:Denies numbness, tingling or new weaknesses Behavioral/Psych: Mood is stable, no new changes  All other systems were reviewed with the patient and are negative.  PHYSICAL EXAMINATION: ECOG PERFORMANCE STATUS: 0 - Asymptomatic  Blood pressure 139/56, pulse 74,  temperature 97.5 F (36.4 C), temperature source Oral, resp. rate 18, height 5\' 5"  (1.651 m), weight 143 lb 9.6 oz (65.137 kg), SpO2 92.00%.  GENERAL:alert, no distress and comfortable; for a pleasant mentally sharp elderly male. HOH. SKIN: skin color, texture, turgor are normal, no rashes, large cystic lesion over his central  forehead, unchanged.  EYES: normal, Conjunctiva are pink and non-injected, sclera clear OROPHARYNX:no exudate, no erythema and lips, buccal mucosa, and tongue normal  NECK: supple, thyroid normal size, non-tender, without nodularity LYMPH:  no palpable lymphadenopathy in the cervical, axillary or supraclavicular LUNGS:  inspiratory and expiratory crackling at the bases of both lungs.  HEART: regular rate & rhythm and no murmurs and 1+ extremity edema bilaterally ABDOMEN:abdomen soft, non-tender and normal bowel sounds; large supero-umbilical hernia. Musculoskeletal:no cyanosis of digits and no clubbing  NEURO: alert & oriented x 3 with fluent speech, no focal motor/sensory deficits; he ambulates with a cane.  Labs:  Lab Results  Component Value Date   WBC 6.7 05/10/2014   HGB 13.6 05/10/2014   HCT 41.0 05/10/2014   MCV 90.7 05/10/2014   PLT 170 05/10/2014   NEUTROABS 4.0 05/10/2014      Chemistry      Component Value Date/Time   NA 141 04/08/2014 0837   NA 139 05/27/2013 1338   NA 142 05/12/2012 0739   K 4.7 04/08/2014 0837   K  4.6 05/27/2013 1338   K 4.5 05/12/2012 0739   CL 104 05/27/2013 1338   CL 103 04/19/2013 0828   CL 100 05/12/2012 0739   CO2 25 04/08/2014 0837   CO2 30 05/27/2013 1338   CO2 30 05/12/2012 0739   BUN 19.0 04/08/2014 0837   BUN 19 05/27/2013 1338   BUN 24* 05/12/2012 0739   CREATININE 1.1 04/08/2014 0837   CREATININE 1.13 05/27/2013 1338   CREATININE 1.6* 05/12/2012 0739      Component Value Date/Time   CALCIUM 9.1 04/08/2014 0837   CALCIUM 9.4 05/27/2013 1338   CALCIUM 8.7 05/12/2012 0739   ALKPHOS 105 03/17/2014 1513   ALKPHOS 151* 05/27/2013 1338   ALKPHOS 109*  05/12/2012 0739   AST 20 03/17/2014 1513   AST 195* 05/27/2013 1338   AST 22 05/12/2012 0739   ALT 17 03/17/2014 1513   ALT 174* 05/27/2013 1338   ALT 27 05/12/2012 0739   BILITOT 0.52 03/17/2014 1513   BILITOT 0.6 05/27/2013 1338   BILITOT 0.80 05/12/2012 0739      CBC:  Recent Labs Lab 05/10/14 0849  WBC 6.7  NEUTROABS 4.0  HGB 13.6  HCT 41.0  MCV 90.7  PLT 170    Studies:  No results found.   RADIOGRAPHIC STUDIES: 1. CT scan of the abdomen and pelvis with IV contrast from 03/04/2010 showed central mesenteric adenopathy. The largest left anterior mesenteric lymph node measures 2.5 x 1.2 cm, felt to be stable in size compared with the prior exam of 03/22/2009. No new adenopathy was noted. There was marked enlargement of the prostate gland with indentation of the urinary bladder base. There is a diverticulum in the right lateral aspect of the urinary bladder, felt to be stable. There was a stable compression fracture of T12 vertebral body. There was patchy airspace disease in the right lower lobe  posteriorly, felt to be due to either infection or inflammatory process.  2. Chest x-ray, 2 view, from 02/15/2011 showed stable chronic bibasilar interstitial disease compared with the chest x-ray of 03/04/2010. No acute findings.  3. CT of the abdomen and pelvis with IV contrast from 04/15/2011 showed progression of peribronchial interstitial thickening within the left and right lower lobes. There was mild interval increase in adenopathy within the central small bowel mesentery. There was increased hydroureter and mild hydronephrosis on the right. A lymph node in the left abdomen measures 18 x 29 mm, similar to 15 x 27 mm on the prior study of 03/04/2010. It was felt that there might be more lymph nodes notable within the mesentery. In the pelvis, there were no enlarged lymph nodes. Again, there was marked enlargement of the prostate gland.  4. CT scan of abdomen and pelvis without IV contrast on  05/12/2012 showed progression of mesenteric adenopathy. Specifically an index node now  measures 4.5 x 2.4 cm on image 24 versus 2.4 x 1.3 cm on the prior CT scan of 04/15/2011. There were scattered colonic diverticula. There was no retrocrural or retroperitoneal adenopathy. There was moderate to marked enlargement of the prostate with some mild bladder wall thickening and a right-sided bladder diverticulum. There was fat-containing ventral abdominal wall hernia. There was development of moderate left-sided hydroureteronephrosis secondary to a distal left ureteric stone. There was similar right-sided hydroureteronephrosis, felt to be due to prostatic enlargement and bladder outlet obstruction. There was improved appearance at the lung bases. T12 compression deformity was unchanged and there was evidence of prior cholecystectomy. There was a tiny  hiatal hernia. 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 Heart size was normal without pericardial or pleural effusion.  5. One-view abdomen on 05/18/2012 showed a 1 cm stone in the distal aspect of the left ureter, felt to be unchanged compared with the abdominal CT scan of 05/12/2012.  6. One-view abdomen on 08/03/2012 showed possible distal left ureteral stone measuring about 6 mm along the left lateral margin of the lower sacrum. There are numerous phleboliths seen in the anatomic pelvis bilaterally, making evaluation for a distal left ureteral stone difficult.  7. Chest x-ray 2 view on 11/13/2012 showed no acute finding. Chronic interstitial change appears stable compared to prior exam from 03/04/2010.  8. CT scan of the abdomen and pelvis with IV contrast On 11/13/2012 showed chronic lower lobe bronchiectasis and peribronchial disease. Stable left-sided mesenteric lymphadenopathy. Stable right-sided hydroureteronephrosis. No obstructing ureteral calculi. Stable bladder diverticulum and enlarged prostate gland. No acute abdominal/pelvic findings.  9.  Left lower extremity venous duplex ultrasound carried out on 02/05/2013 showed no evidence of left lower extremity deep venous thrombosis. There was no evidence of superficial thrombophlebitis or abnormal fluid collection. 10. CT of abdomen pelvis on 12/22/2013. Increased mesenteric and upper abdominal lymphadenopathy, consistent with progression of lymphoma. Increased dilatation of common bile duct, which could be due to increased porta hepatis and peripancreatic lymphadenopathy, although choledocholithiasis cannot be excluded. Consider MRCP for further evaluation if clinically warranted. Stable markedly enlarged prostate, findings of chronic bladder outlet obstruction, and severe right hydronephrosis. Stable large right inguinal hernia containing only fat. Severe left-sided diverticulosis. No definite radiographic evidence of acute diverticulitis.  11. CT of abdomen pelvis on 04/06/2014. Stable upper abdominal/mesenteric lymphadenopathy, as above. Severe right hydroureteronephrosis, unchanged. Prostatomegaly. Status post cholecystectomy with stable intrahepatic/extrahepatic ductal dilatation, likely postsurgical. Additional stable ancillary findings above.  ASSESSMENT: Daniel [name] 80 y.o. male with a history of Lymphoma, small lymphocytic - Plan: CBC with Differential, Comprehensive metabolic panel (Cmet) - [facility], Lactate dehydrogenase (LDH) - [facility]  PLAN:   1. Small lymphocytic lymphoma. --Daniel [name]'s condition remained stable. However, his last CT scan of the abdomen and pelvis demonstrated stable disease.   --We will go ahead with another course of treatment with Rituxan 600 mg and Decadron 20 mg IV. We will continue Rituxan every one month. We will repeat scans in 3-4 months or based on symptoms.    2. Follow-up. --Daniel [name] will return in one month at which time we will check CBC and chemistries, and receive rituximab.   All questions were answered. The patient knows to call the clinic with any  problems, questions or concerns. We can certainly see the patient much sooner if necessary.  I spent 10 minutes counseling the patient face to face. The total time spent in the appointment was 15 minutes.    Concha Norway, MD 05/10/2014 9:23 AM

## 2014-06-09 ENCOUNTER — Ambulatory Visit (HOSPITAL_BASED_OUTPATIENT_CLINIC_OR_DEPARTMENT_OTHER): Payer: Medicare Other | Admitting: Internal Medicine

## 2014-06-09 ENCOUNTER — Telehealth: Payer: Self-pay | Admitting: Internal Medicine

## 2014-06-09 ENCOUNTER — Ambulatory Visit: Payer: Medicare Other

## 2014-06-09 ENCOUNTER — Other Ambulatory Visit (HOSPITAL_BASED_OUTPATIENT_CLINIC_OR_DEPARTMENT_OTHER): Payer: Medicare Other

## 2014-06-09 ENCOUNTER — Telehealth: Payer: Self-pay | Admitting: *Deleted

## 2014-06-09 VITALS — BP 123/62 | HR 74 | Temp 97.7°F | Resp 18 | Ht 65.0 in | Wt 141.4 lb

## 2014-06-09 DIAGNOSIS — C8300 Small cell B-cell lymphoma, unspecified site: Secondary | ICD-10-CM

## 2014-06-09 DIAGNOSIS — C8583 Other specified types of non-Hodgkin lymphoma, intra-abdominal lymph nodes: Secondary | ICD-10-CM

## 2014-06-09 LAB — CBC WITH DIFFERENTIAL/PLATELET
BASO%: 1.2 % (ref 0.0–2.0)
BASOS ABS: 0.1 10*3/uL (ref 0.0–0.1)
EOS%: 3.3 % (ref 0.0–7.0)
Eosinophils Absolute: 0.2 10*3/uL (ref 0.0–0.5)
HEMATOCRIT: 43 % (ref 38.4–49.9)
HEMOGLOBIN: 14.7 g/dL (ref 13.0–17.1)
LYMPH#: 1.8 10*3/uL (ref 0.9–3.3)
LYMPH%: 28.7 % (ref 14.0–49.0)
MCH: 31 pg (ref 27.2–33.4)
MCHC: 34.1 g/dL (ref 32.0–36.0)
MCV: 90.9 fL (ref 79.3–98.0)
MONO#: 0.9 10*3/uL (ref 0.1–0.9)
MONO%: 14.5 % — ABNORMAL HIGH (ref 0.0–14.0)
NEUT%: 52.3 % (ref 39.0–75.0)
NEUTROS ABS: 3.3 10*3/uL (ref 1.5–6.5)
Platelets: 168 10*3/uL (ref 140–400)
RBC: 4.73 10*6/uL (ref 4.20–5.82)
RDW: 14.9 % — ABNORMAL HIGH (ref 11.0–14.6)
WBC: 6.3 10*3/uL (ref 4.0–10.3)

## 2014-06-09 LAB — COMPREHENSIVE METABOLIC PANEL (CC13)
ALT: 26 U/L (ref 0–55)
ANION GAP: 9 meq/L (ref 3–11)
AST: 29 U/L (ref 5–34)
Albumin: 3.4 g/dL — ABNORMAL LOW (ref 3.5–5.0)
Alkaline Phosphatase: 97 U/L (ref 40–150)
BUN: 20.7 mg/dL (ref 7.0–26.0)
CALCIUM: 9.1 mg/dL (ref 8.4–10.4)
CHLORIDE: 109 meq/L (ref 98–109)
CO2: 24 meq/L (ref 22–29)
CREATININE: 1.2 mg/dL (ref 0.7–1.3)
Glucose: 97 mg/dl (ref 70–140)
POTASSIUM: 4.3 meq/L (ref 3.5–5.1)
Sodium: 142 mEq/L (ref 136–145)
Total Bilirubin: 0.65 mg/dL (ref 0.20–1.20)
Total Protein: 5.6 g/dL — ABNORMAL LOW (ref 6.4–8.3)

## 2014-06-09 LAB — LACTATE DEHYDROGENASE (CC13): LDH: 185 U/L (ref 125–245)

## 2014-06-09 NOTE — Telephone Encounter (Signed)
, °

## 2014-06-09 NOTE — Progress Notes (Signed)
[facility] OFFICE PROGRESS NOTE  [name], MD [address] [place] [address]  DIAGNOSIS: Lymphoma, small lymphocytic  Chief Complaint  Patient presents with  . Follow-up    PRIOR THERAPY: -Rituxan 375 mg/meter squared, equal to 600 mg IV, had been administered from 05/19/2012 through 12/17/2012. Starting 12/17/2012, the patient is receiving Rituxan 600 mg IV every 2 months. Decadron 20 mg IV every 2 months with Rituxan.  Last received 06/24/2013, 01/14/2014 .   CURRENT THERAPY: -Rituxan 375 mg/meter squared plsu decadron 20 mg IV every one month since 12/15/2013.  INTERVAL HISTORY: Daniel [name] 80 y.o. male with a history of small lymphocytic B cell non-Hodgkin's lymphoma is here for followup. He was last seen by me on 04/08/2014.   His lymphoma presented as a large abdominal mass involving the mesentery. He was diagnosed in may 2019 with stage IV disease on the basis of peripheral blood and bone marrow involvement.. At this time he received Rituxan 600 mg and Decadron 20 mg IV. He's had no major problems with these infusions. He was lost to followup for about six months and presented for follow up in 12/15/2013.   The patient states that he has improved energy. He denies any abdominal pain any changes in his stools. His weight is stable. He's very active and drives himself to and from his appointments. He has a wife of 23 years. He reports a chronic cough that's nonproductive over the last 3-4 years.  MEDICAL HISTORY: Past Medical History  Diagnosis Date  . HOH (hard of hearing)   . Diverticulosis   . BPH (benign prostatic hyperplasia)   . Congenital malformation of spine     T12  . Hx of hydronephrosis   . NHL (non-Hodgkin's lymphoma)     nhl dx 5/09;  . History of kidney stones 2013  . Cough for last 2 years    occasional white sputum  . Sleep apnea   . Prostate enlargement   . Non-Hodgkin lymphoma     INTERIM HISTORY: has CHRONIC  AIRWAY OBSTRUCTION NEC; ACHALASIA; COUGH; Lymphoma, small lymphocytic; Hydroureteronephrosis; Bronchiectasis without acute exacerbation; and Cellulitis of arm, right on his problem list.    ALLERGIES:  has No Known Allergies.  MEDICATIONS: has a current medication list which includes the following prescription(s): finasteride, fish oil-omega-3 fatty acids, furosemide, glucosamine-chondroitin, hypromellose, lansoprazole, and multiple vitamins-minerals.  SURGICAL HISTORY:  Past Surgical History  Procedure Laterality Date  . Prostate surgery  yrs ago  . Eye surgery  yrs ago    Implants bilateral  . Inguinal hernia repair  yrs ago    left  . Cholecystectomy  yrs ago  . Esophagogastroduodenoscopy (egd) with propofol N/A 07/15/2013    Procedure: ESOPHAGOGASTRODUODENOSCOPY (EGD) WITH PROPOFOL;  Surgeon: Milus Banister, MD;  Location: [facility];  Service: Endoscopy;  Laterality: N/A;  botox injection  . Botox injection N/A 07/15/2013    Procedure: BOTOX INJECTION;  Surgeon: Milus Banister, MD;  Location: [facility];  Service: Endoscopy;  Laterality: N/A;   PROBLEM LIST:  1. Small lymphocytic B-cell non-Hodgkin lymphoma presenting with a large abdominal mass in the mesentery. Core needle biopsy on 04/21/2008 showed coexpression of CD5 and CD20, also CD19, CD21, and CD22. CD10 was negative. The patient had involvement of the peripheral blood and bone marrow when he was evaluated at [facility] in early June 2009. The patient initially was treated with Rituxan and fludarabine, 4 cycles from 05/31/2008 through  08/22/2008. The patient had an excellent response to his induction treatment with Rituxan and fludarabine. Beginning in the fall of 2009, the patient was maintained on Rituxan every 3 months. However, a CT scan of the abdomen and pelvis carried out without IV contrast on 05/12/2012 showed mild progression of mesenteric adenopathy when compared with the CT scan of  the abdomen and pelvis carried out on 04/15/2011. On 05/19/2012, the patient began receiving Rituxan on a monthly basis. It should be noted that Daniel [name] underwent hepatitis screening on 10/18/2010. This consisted of hepatitis B surface antibody, hepatitis B surface antigen, hepatitis B core antibody total, and hepatitis core antibody IgM. All of these were negative. The patient had been receiving monthly Rituxan since 05/19/2012. CT scan of the abdomen and pelvis carried out on 11/13/2012 showed no significant changes, possibly even some improvement. Rituxan was being given every 2 months as of 12/17/2012.  2. Chronic cough possibly related to GERD.  3. Achalasia.  4. COPD.  5. History of kidney stones.  6. History of elevated PSA.  7. BPH.  8. Hearing impairment.  9. Sun related damage of skin.  10. Diverticulosis.  11. Multiple colonic polyps.  12. Macular degeneration, under the care of Dr. Monna Fam.  13. Lithotripsy carried out on an obstructing distal left ureteral stone on 05/18/2012 by Dr. Franchot Gallo.  REVIEW OF SYSTEMS:   Constitutional: Denies fevers, chills or abnormal weight loss Eyes: Denies blurriness of vision Ears, nose, mouth, throat, and face: Denies mucositis or sore throat Respiratory: Denies cough, dyspnea or wheezes Cardiovascular: Denies palpitation, chest discomfort or lower extremity swelling Gastrointestinal:  Denies nausea, heartburn or change in bowel habits Skin: Denies abnormal skin rashes; + r arm redness Lymphatics: Denies new lymphadenopathy or easy bruising Neurological:Denies numbness, tingling or new weaknesses Behavioral/Psych: Mood is stable, no new changes  All other systems were reviewed with the patient and are negative.  PHYSICAL EXAMINATION: ECOG PERFORMANCE STATUS: 0 - Asymptomatic  Blood pressure 123/62, pulse 74, temperature 97.7 F (36.5 C), temperature source Oral, resp. rate 18, height 5\' 5"  (1.651 m), weight 141 lb 6.4 oz  (64.139 kg).  GENERAL:alert, no distress and comfortable; a pleasant mentally sharp elderly male. HOH. SKIN: skin color, texture, turgor are normal, no rashes, large cystic lesion over his central  forehead, unchanged.  EYES: normal, Conjunctiva are pink and non-injected, sclera clear OROPHARYNX:no exudate, no erythema and lips, buccal mucosa, and tongue normal  NECK: supple, thyroid normal size, non-tender, without nodularity LYMPH:  no palpable lymphadenopathy in the cervical, axillary or supraclavicular LUNGS:  inspiratory and expiratory crackling at the bases of both lungs.  HEART: regular rate & rhythm and no murmurs and 1+ extremity edema bilaterally ABDOMEN:abdomen soft, non-tender and normal bowel sounds; large supero-umbilical hernia. Musculoskeletal:no cyanosis of digits and no clubbing  NEURO: alert & oriented x 3 with fluent speech, no focal motor/sensory deficits; he ambulates with a cane.  Labs:  Lab Results  Component Value Date   WBC 6.3 06/09/2014   HGB 14.7 06/09/2014   HCT 43.0 06/09/2014   MCV 90.9 06/09/2014   PLT 168 06/09/2014   NEUTROABS 3.3 06/09/2014      Chemistry      Component Value Date/Time   NA 142 06/09/2014 0826   NA 139 05/27/2013 1338   NA 142 05/12/2012 0739   K 4.3 06/09/2014 0826   K 4.6 05/27/2013 1338   K 4.5 05/12/2012 0739   CL 104 05/27/2013 1338   CL 103  04/19/2013 0828   CL 100 05/12/2012 0739   CO2 24 06/09/2014 0826   CO2 30 05/27/2013 1338   CO2 30 05/12/2012 0739   BUN 20.7 06/09/2014 0826   BUN 19 05/27/2013 1338   BUN 24* 05/12/2012 0739   CREATININE 1.2 06/09/2014 0826   CREATININE 1.13 05/27/2013 1338   CREATININE 1.6* 05/12/2012 0739      Component Value Date/Time   CALCIUM 9.1 06/09/2014 0826   CALCIUM 9.4 05/27/2013 1338   CALCIUM 8.7 05/12/2012 0739   ALKPHOS 97 06/09/2014 0826   ALKPHOS 151* 05/27/2013 1338   ALKPHOS 109* 05/12/2012 0739   AST 29 06/09/2014 0826   AST 195* 05/27/2013 1338   AST 22 05/12/2012 0739   ALT 26 06/09/2014 0826   ALT 174* 05/27/2013  1338   ALT 27 05/12/2012 0739   BILITOT 0.65 06/09/2014 0826   BILITOT 0.6 05/27/2013 1338   BILITOT 0.80 05/12/2012 0739      CBC:  Recent Labs Lab 06/09/14 0826  WBC 6.3  NEUTROABS 3.3  HGB 14.7  HCT 43.0  MCV 90.9  PLT 168    Studies:  No results found.   RADIOGRAPHIC STUDIES: 1. CT scan of the abdomen and pelvis with IV contrast from 03/04/2010 showed central mesenteric adenopathy. The largest left anterior mesenteric lymph node measures 2.5 x 1.2 cm, felt to be stable in size compared with the prior exam of 03/22/2009. No new adenopathy was noted. There was marked enlargement of the prostate gland with indentation of the urinary bladder base. There is a diverticulum in the right lateral aspect of the urinary bladder, felt to be stable. There was a stable compression fracture of T12 vertebral body. There was patchy airspace disease in the right lower lobe  posteriorly, felt to be due to either infection or inflammatory process.  2. Chest x-ray, 2 view, from 02/15/2011 showed stable chronic bibasilar interstitial disease compared with the chest x-ray of 03/04/2010. No acute findings.  3. CT of the abdomen and pelvis with IV contrast from 04/15/2011 showed progression of peribronchial interstitial thickening within the left and right lower lobes. There was mild interval increase in adenopathy within the central small bowel mesentery. There was increased hydroureter and mild hydronephrosis on the right. A lymph node in the left abdomen measures 18 x 29 mm, similar to 15 x 27 mm on the prior study of 03/04/2010. It was felt that there might be more lymph nodes notable within the mesentery. In the pelvis, there were no enlarged lymph nodes. Again, there was marked enlargement of the prostate gland.  4. CT scan of abdomen and pelvis without IV contrast on 05/12/2012 showed progression of mesenteric adenopathy. Specifically an index node now  measures 4.5 x 2.4 cm on image 24 versus 2.4 x 1.3 cm  on the prior CT scan of 04/15/2011. There were scattered colonic diverticula. There was no retrocrural or retroperitoneal adenopathy. There was moderate to marked enlargement of the prostate with some mild bladder wall thickening and a right-sided bladder diverticulum. There was fat-containing ventral abdominal wall hernia. There was development of moderate left-sided hydroureteronephrosis secondary to a distal left ureteric stone. There was similar right-sided hydroureteronephrosis, felt to be due to prostatic enlargement and bladder outlet obstruction. There was improved appearance at the lung bases. T12 compression deformity was unchanged and there was evidence of prior cholecystectomy. There was a tiny hiatal hernia. 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 Heart size was normal without pericardial or pleural effusion.  5. One-view abdomen on 05/18/2012 showed a 1 cm stone in the distal aspect of the left ureter, felt to be unchanged compared with the abdominal CT scan of 05/12/2012.  6. One-view abdomen on 08/03/2012 showed possible distal left ureteral stone measuring about 6 mm along the left lateral margin of the lower sacrum. There are numerous phleboliths seen in the anatomic pelvis bilaterally, making evaluation for a distal left ureteral stone difficult.  7. Chest x-ray 2 view on 11/13/2012 showed no acute finding. Chronic interstitial change appears stable compared to prior exam from 03/04/2010.  8. CT scan of the abdomen and pelvis with IV contrast On 11/13/2012 showed chronic lower lobe bronchiectasis and peribronchial disease. Stable left-sided mesenteric lymphadenopathy. Stable right-sided hydroureteronephrosis. No obstructing ureteral calculi. Stable bladder diverticulum and enlarged prostate gland. No acute abdominal/pelvic findings.  9. Left lower extremity venous duplex ultrasound carried out on 02/05/2013 showed no evidence of left lower extremity deep venous thrombosis.  There was no evidence of superficial thrombophlebitis or abnormal fluid collection. 10. CT of abdomen pelvis on 12/22/2013. Increased mesenteric and upper abdominal lymphadenopathy, consistent with progression of lymphoma. Increased dilatation of common bile duct, which could be due to increased porta hepatis and peripancreatic lymphadenopathy, although choledocholithiasis cannot be excluded. Consider MRCP for further evaluation if clinically warranted. Stable markedly enlarged prostate, findings of chronic bladder outlet obstruction, and severe right hydronephrosis. Stable large right inguinal hernia containing only fat. Severe left-sided diverticulosis. No definite radiographic evidence of acute diverticulitis.  11. CT of abdomen pelvis on 04/06/2014. Stable upper abdominal/mesenteric lymphadenopathy, as above. Severe right hydroureteronephrosis, unchanged. Prostatomegaly. Status post cholecystectomy with stable intrahepatic/extrahepatic ductal dilatation, likely postsurgical. Additional stable ancillary findings above.  ASSESSMENT: GAEGE SANGALANG 80 y.o. male with a history of Lymphoma, small lymphocytic  PLAN:   1. Small lymphocytic lymphoma. --Daniel [name]'s condition remained stable. However, his last CT scan of the abdomen and pelvis demonstrated stable disease.   --We will go ahead with another course of treatment with Rituxan 600 mg and Decadron 20 mg IV. We will continue Rituxan every two months. We will repeat scans in 3-4 months or based on symptoms.    2. Follow-up. --Daniel [name] will return in one month at which time we will check CBC and chemistries, and receive rituximab.   All questions were answered. The patient knows to call the clinic with any problems, questions or concerns. We can certainly see the patient much sooner if necessary.  I spent 10 minutes counseling the patient face to face. The total time spent in the appointment was 15 minutes.    Maeryn Mcgath, MD 06/09/2014 9:08  AM

## 2014-06-09 NOTE — Telephone Encounter (Signed)
Per staff message and POF I have scheduled appts. Advised scheduler of appts. JMW  

## 2014-06-10 ENCOUNTER — Encounter: Payer: Self-pay | Admitting: Internal Medicine

## 2014-07-07 ENCOUNTER — Telehealth: Payer: Self-pay | Admitting: Internal Medicine

## 2014-07-07 ENCOUNTER — Ambulatory Visit (HOSPITAL_BASED_OUTPATIENT_CLINIC_OR_DEPARTMENT_OTHER): Payer: Medicare Other | Admitting: Internal Medicine

## 2014-07-07 ENCOUNTER — Other Ambulatory Visit (HOSPITAL_BASED_OUTPATIENT_CLINIC_OR_DEPARTMENT_OTHER): Payer: Medicare Other

## 2014-07-07 ENCOUNTER — Ambulatory Visit (HOSPITAL_BASED_OUTPATIENT_CLINIC_OR_DEPARTMENT_OTHER): Payer: Medicare Other

## 2014-07-07 VITALS — BP 103/49 | HR 65 | Temp 97.4°F | Resp 17

## 2014-07-07 VITALS — BP 119/55 | HR 85 | Temp 97.8°F | Resp 16 | Ht 65.0 in | Wt 140.4 lb

## 2014-07-07 DIAGNOSIS — C8583 Other specified types of non-Hodgkin lymphoma, intra-abdominal lymph nodes: Secondary | ICD-10-CM

## 2014-07-07 DIAGNOSIS — C8300 Small cell B-cell lymphoma, unspecified site: Secondary | ICD-10-CM

## 2014-07-07 DIAGNOSIS — R059 Cough, unspecified: Secondary | ICD-10-CM

## 2014-07-07 DIAGNOSIS — Z5112 Encounter for antineoplastic immunotherapy: Secondary | ICD-10-CM

## 2014-07-07 DIAGNOSIS — R05 Cough: Secondary | ICD-10-CM

## 2014-07-07 LAB — CBC WITH DIFFERENTIAL/PLATELET
BASO%: 0.4 % (ref 0.0–2.0)
Basophils Absolute: 0 10*3/uL (ref 0.0–0.1)
EOS%: 1.5 % (ref 0.0–7.0)
Eosinophils Absolute: 0.1 10*3/uL (ref 0.0–0.5)
HCT: 40.8 % (ref 38.4–49.9)
HGB: 13.2 g/dL (ref 13.0–17.1)
LYMPH%: 26.3 % (ref 14.0–49.0)
MCH: 30.1 pg (ref 27.2–33.4)
MCHC: 32.4 g/dL (ref 32.0–36.0)
MCV: 93.2 fL (ref 79.3–98.0)
MONO#: 1.1 10*3/uL — ABNORMAL HIGH (ref 0.1–0.9)
MONO%: 14.1 % — AB (ref 0.0–14.0)
NEUT#: 4.6 10*3/uL (ref 1.5–6.5)
NEUT%: 57.7 % (ref 39.0–75.0)
PLATELETS: 160 10*3/uL (ref 140–400)
RBC: 4.38 10*6/uL (ref 4.20–5.82)
RDW: 15.1 % — ABNORMAL HIGH (ref 11.0–14.6)
WBC: 7.9 10*3/uL (ref 4.0–10.3)
lymph#: 2.1 10*3/uL (ref 0.9–3.3)

## 2014-07-07 LAB — BASIC METABOLIC PANEL (CC13)
Anion Gap: 8 mEq/L (ref 3–11)
BUN: 19.6 mg/dL (ref 7.0–26.0)
CO2: 24 mEq/L (ref 22–29)
CREATININE: 1.2 mg/dL (ref 0.7–1.3)
Calcium: 9 mg/dL (ref 8.4–10.4)
Chloride: 108 mEq/L (ref 98–109)
Glucose: 162 mg/dl — ABNORMAL HIGH (ref 70–140)
Potassium: 4.1 mEq/L (ref 3.5–5.1)
Sodium: 140 mEq/L (ref 136–145)

## 2014-07-07 LAB — LACTATE DEHYDROGENASE (CC13): LDH: 204 U/L (ref 125–245)

## 2014-07-07 MED ORDER — SODIUM CHLORIDE 0.9 % IV SOLN
375.0000 mg/m2 | Freq: Once | INTRAVENOUS | Status: AC
  Administered 2014-07-07: 600 mg via INTRAVENOUS
  Filled 2014-07-07: qty 60

## 2014-07-07 MED ORDER — FAMOTIDINE IN NACL 20-0.9 MG/50ML-% IV SOLN
20.0000 mg | Freq: Once | INTRAVENOUS | Status: AC
  Administered 2014-07-07: 20 mg via INTRAVENOUS

## 2014-07-07 MED ORDER — DEXAMETHASONE SODIUM PHOSPHATE 10 MG/ML IJ SOLN
20.0000 mg | Freq: Once | INTRAMUSCULAR | Status: AC
  Administered 2014-07-07: 20 mg via INTRAVENOUS

## 2014-07-07 MED ORDER — DIPHENHYDRAMINE HCL 25 MG PO CAPS
25.0000 mg | ORAL_CAPSULE | Freq: Once | ORAL | Status: AC
  Administered 2014-07-07: 25 mg via ORAL

## 2014-07-07 MED ORDER — ACETAMINOPHEN 325 MG PO TABS
ORAL_TABLET | ORAL | Status: AC
  Filled 2014-07-07: qty 2

## 2014-07-07 MED ORDER — ONDANSETRON 8 MG/NS 50 ML IVPB
INTRAVENOUS | Status: AC
  Filled 2014-07-07: qty 8

## 2014-07-07 MED ORDER — SODIUM CHLORIDE 0.9 % IV SOLN
Freq: Once | INTRAVENOUS | Status: AC
  Administered 2014-07-07: 10:00:00 via INTRAVENOUS

## 2014-07-07 MED ORDER — ONDANSETRON 8 MG/50ML IVPB (CHCC)
8.0000 mg | Freq: Once | INTRAVENOUS | Status: AC
  Administered 2014-07-07: 8 mg via INTRAVENOUS

## 2014-07-07 MED ORDER — ACETAMINOPHEN 325 MG PO TABS
650.0000 mg | ORAL_TABLET | Freq: Once | ORAL | Status: AC
  Administered 2014-07-07: 650 mg via ORAL

## 2014-07-07 MED ORDER — DIPHENHYDRAMINE HCL 25 MG PO CAPS
ORAL_CAPSULE | ORAL | Status: AC
  Filled 2014-07-07: qty 1

## 2014-07-07 MED ORDER — FAMOTIDINE IN NACL 20-0.9 MG/50ML-% IV SOLN
INTRAVENOUS | Status: AC
  Filled 2014-07-07: qty 50

## 2014-07-07 MED ORDER — DEXAMETHASONE SODIUM PHOSPHATE 20 MG/5ML IJ SOLN
INTRAMUSCULAR | Status: AC
  Filled 2014-07-07: qty 5

## 2014-07-07 NOTE — Progress Notes (Signed)
[facility] OFFICE PROGRESS NOTE  [name], MD [address] [place] [address]  DIAGNOSIS: Lymphoma, small lymphocytic - Plan: CBC with Differential, Lactate dehydrogenase (LDH) - [facility], Basic metabolic panel (Bmet) - [facility]  Cough  Chief Complaint  Patient presents with  . Follow-up    PRIOR THERAPY: -Rituxan 375 mg/meter squared, equal to 600 mg IV, had been administered from 05/19/2012 through 12/17/2012. Starting 12/17/2012, the patient is receiving Rituxan 600 mg IV every 2 months. Decadron 20 mg IV every 2 months with Rituxan.  Last received 05/10/2014 .   CURRENT THERAPY: -Rituxan 375 mg/meter squared plsu decadron 20 mg IV every one month since 12/15/2013. Restarted every 2 months on 05/10/2014.  INTERVAL HISTORY: Daniel [name] 80 y.o. male with a history of small lymphocytic B cell non-Hodgkin's lymphoma is here for followup. He was last seen by me on 05/10/2014.   His lymphoma presented as a large abdominal mass involving the mesentery. He was diagnosed in may 2019 with stage IV disease on the basis of peripheral blood and bone marrow involvement.. At this time he received Rituxan 600 mg and Decadron 20 mg IV. He's had no major problems with these infusions. He was lost to followup for about six months and presented for follow up in 12/15/2013. Due to his symptoms including abdominal fullness and discomfort, we restarted monthly rituxan for several months.   The patient states that he has improved energy. He denies any abdominal pain any changes in his stools. His weight is stable. He's very active and drives himself to and from his appointments. He has a wife of 97 years who is in rehab. He reports a chronic cough that's nonproductive over the last 3-4 years.  MEDICAL HISTORY: Past Medical History  Diagnosis Date  . HOH (hard of hearing)   . Diverticulosis   . BPH (benign prostatic hyperplasia)   . Congenital malformation of spine     T12  .  Hx of hydronephrosis   . NHL (non-Hodgkin's lymphoma)     nhl dx 5/09;  . History of kidney stones 2013  . Cough for last 2 years    occasional white sputum  . Sleep apnea   . Prostate enlargement   . Non-Hodgkin lymphoma     INTERIM HISTORY: has CHRONIC AIRWAY OBSTRUCTION NEC; ACHALASIA; COUGH; Lymphoma, small lymphocytic; Hydroureteronephrosis; Bronchiectasis without acute exacerbation; and Cellulitis of arm, right on his problem list.    ALLERGIES:  has No Known Allergies.  MEDICATIONS: has a current medication list which includes the following prescription(s): finasteride, fish oil-omega-3 fatty acids, furosemide, glucosamine-chondroitin, hypromellose, lansoprazole, and multiple vitamins-minerals.  SURGICAL HISTORY:  Past Surgical History  Procedure Laterality Date  . Prostate surgery  yrs ago  . Eye surgery  yrs ago    Implants bilateral  . Inguinal hernia repair  yrs ago    left  . Cholecystectomy  yrs ago  . Esophagogastroduodenoscopy (egd) with propofol N/A 07/15/2013    Procedure: ESOPHAGOGASTRODUODENOSCOPY (EGD) WITH PROPOFOL;  Surgeon: Milus Banister, MD;  Location: [facility];  Service: Endoscopy;  Laterality: N/A;  botox injection  . Botox injection N/A 07/15/2013    Procedure: BOTOX INJECTION;  Surgeon: Milus Banister, MD;  Location: [facility];  Service: Endoscopy;  Laterality: N/A;   PROBLEM LIST:  1. Small lymphocytic B-cell non-Hodgkin lymphoma presenting with a large abdominal mass in the mesentery. Core needle biopsy on 04/21/2008 showed coexpression of CD5 and CD20, also CD19, CD21, and CD22.  CD10 was negative. The patient had involvement of the peripheral blood and bone marrow when he was evaluated at [facility] in early June 2009. The patient initially was treated with Rituxan and fludarabine, 4 cycles from 05/31/2008 through 08/22/2008. The patient had an excellent response to his induction treatment with Rituxan and  fludarabine. Beginning in the fall of 2009, the patient was maintained on Rituxan every 3 months. However, a CT scan of the abdomen and pelvis carried out without IV contrast on 05/12/2012 showed mild progression of mesenteric adenopathy when compared with the CT scan of the abdomen and pelvis carried out on 04/15/2011. On 05/19/2012, the patient began receiving Rituxan on a monthly basis. It should be noted that Daniel [name] underwent hepatitis screening on 10/18/2010. This consisted of hepatitis B surface antibody, hepatitis B surface antigen, hepatitis B core antibody total, and hepatitis core antibody IgM. All of these were negative. The patient had been receiving monthly Rituxan since 05/19/2012. CT scan of the abdomen and pelvis carried out on 11/13/2012 showed no significant changes, possibly even some improvement. Rituxan was being given every 2 months as of 12/17/2012.  2. Chronic cough possibly related to GERD.  3. Achalasia.  4. COPD.  5. History of kidney stones.  6. History of elevated PSA.  7. BPH.  8. Hearing impairment.  9. Sun related damage of skin.  10. Diverticulosis.  11. Multiple colonic polyps.  12. Macular degeneration, under the care of Dr. Monna Fam.  13. Lithotripsy carried out on an obstructing distal left ureteral stone on 05/18/2012 by Dr. Franchot Gallo.  REVIEW OF SYSTEMS:   Constitutional: Denies fevers, chills or abnormal weight loss Eyes: Denies blurriness of vision Ears, nose, mouth, throat, and face: Denies mucositis or sore throat Respiratory: Denies cough, dyspnea or wheezes Cardiovascular: Denies palpitation, chest discomfort or lower extremity swelling Gastrointestinal:  Denies nausea, heartburn or change in bowel habits Skin: Denies abnormal skin rashes; + r arm redness Lymphatics: Denies new lymphadenopathy or easy bruising Neurological:Denies numbness, tingling or new weaknesses Behavioral/Psych: Mood is stable, no new changes  All other  systems were reviewed with the patient and are negative.  PHYSICAL EXAMINATION: ECOG PERFORMANCE STATUS: 0 - Asymptomatic  Blood pressure 119/55, pulse 85, temperature 97.8 F (36.6 C), temperature source Oral, resp. rate 16, height 5\' 5"  (1.651 m), weight 140 lb 6.4 oz (63.685 kg), SpO2 94.00%.  GENERAL:alert, no distress and comfortable; a pleasant mentally sharp elderly male. HOH. SKIN: skin color, texture, turgor are normal, no rashes, large cystic lesion over his central  forehead, unchanged.  EYES: normal, Conjunctiva are pink and non-injected, sclera clear OROPHARYNX:no exudate, no erythema and lips, buccal mucosa, and tongue normal  NECK: supple, thyroid normal size, non-tender, without nodularity LYMPH:  no palpable lymphadenopathy in the cervical, axillary or supraclavicular LUNGS:  inspiratory and expiratory crackling at the bases of both lungs.  HEART: regular rate & rhythm and no murmurs and 1+ extremity edema bilaterally ABDOMEN:abdomen soft, non-tender and normal bowel sounds; large supero-umbilical hernia. Musculoskeletal:no cyanosis of digits and no clubbing  NEURO: alert & oriented x 3 with fluent speech, no focal motor/sensory deficits; he ambulates with a cane.  Labs:  Lab Results  Component Value Date   WBC 7.9 07/07/2014   HGB 13.2 07/07/2014   HCT 40.8 07/07/2014   MCV 93.2 07/07/2014   PLT 160 07/07/2014   NEUTROABS 4.6 07/07/2014      Chemistry      Component Value Date/Time  NA 140 07/07/2014 0854   NA 139 05/27/2013 1338   NA 142 05/12/2012 0739   K 4.1 07/07/2014 0854   K 4.6 05/27/2013 1338   K 4.5 05/12/2012 0739   CL 104 05/27/2013 1338   CL 103 04/19/2013 0828   CL 100 05/12/2012 0739   CO2 24 07/07/2014 0854   CO2 30 05/27/2013 1338   CO2 30 05/12/2012 0739   BUN 19.6 07/07/2014 0854   BUN 19 05/27/2013 1338   BUN 24* 05/12/2012 0739   CREATININE 1.2 07/07/2014 0854   CREATININE 1.13 05/27/2013 1338   CREATININE 1.6* 05/12/2012 0739      Component Value  Date/Time   CALCIUM 9.0 07/07/2014 0854   CALCIUM 9.4 05/27/2013 1338   CALCIUM 8.7 05/12/2012 0739   ALKPHOS 97 06/09/2014 0826   ALKPHOS 151* 05/27/2013 1338   ALKPHOS 109* 05/12/2012 0739   AST 29 06/09/2014 0826   AST 195* 05/27/2013 1338   AST 22 05/12/2012 0739   ALT 26 06/09/2014 0826   ALT 174* 05/27/2013 1338   ALT 27 05/12/2012 0739   BILITOT 0.65 06/09/2014 0826   BILITOT 0.6 05/27/2013 1338   BILITOT 0.80 05/12/2012 0739      CBC:  Recent Labs Lab 07/07/14 0854  WBC 7.9  NEUTROABS 4.6  HGB 13.2  HCT 40.8  MCV 93.2  PLT 160    Studies:  No results found.   RADIOGRAPHIC STUDIES: 1. CT scan of the abdomen and pelvis with IV contrast from 03/04/2010 showed central mesenteric adenopathy. The largest left anterior mesenteric lymph node measures 2.5 x 1.2 cm, felt to be stable in size compared with the prior exam of 03/22/2009. No new adenopathy was noted. There was marked enlargement of the prostate gland with indentation of the urinary bladder base. There is a diverticulum in the right lateral aspect of the urinary bladder, felt to be stable. There was a stable compression fracture of T12 vertebral body. There was patchy airspace disease in the right lower lobe  posteriorly, felt to be due to either infection or inflammatory process.  2. Chest x-ray, 2 view, from 02/15/2011 showed stable chronic bibasilar interstitial disease compared with the chest x-ray of 03/04/2010. No acute findings.  3. CT of the abdomen and pelvis with IV contrast from 04/15/2011 showed progression of peribronchial interstitial thickening within the left and right lower lobes. There was mild interval increase in adenopathy within the central small bowel mesentery. There was increased hydroureter and mild hydronephrosis on the right. A lymph node in the left abdomen measures 18 x 29 mm, similar to 15 x 27 mm on the prior study of 03/04/2010. It was felt that there might be more lymph nodes notable within the mesentery.  In the pelvis, there were no enlarged lymph nodes. Again, there was marked enlargement of the prostate gland.  4. CT scan of abdomen and pelvis without IV contrast on 05/12/2012 showed progression of mesenteric adenopathy. Specifically an index node now  measures 4.5 x 2.4 cm on image 24 versus 2.4 x 1.3 cm on the prior CT scan of 04/15/2011. There were scattered colonic diverticula. There was no retrocrural or retroperitoneal adenopathy. There was moderate to marked enlargement of the prostate with some mild bladder wall thickening and a right-sided bladder diverticulum. There was fat-containing ventral abdominal wall hernia. There was development of moderate left-sided hydroureteronephrosis secondary to a distal left ureteric stone. There was similar right-sided hydroureteronephrosis, felt to be due to prostatic enlargement and bladder outlet  obstruction. There was improved appearance at the lung bases. T12 compression deformity was unchanged and there was evidence of prior cholecystectomy. There was a tiny hiatal hernia. 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 Heart size was normal without pericardial or pleural effusion.  5. One-view abdomen on 05/18/2012 showed a 1 cm stone in the distal aspect of the left ureter, felt to be unchanged compared with the abdominal CT scan of 05/12/2012.  6. One-view abdomen on 08/03/2012 showed possible distal left ureteral stone measuring about 6 mm along the left lateral margin of the lower sacrum. There are numerous phleboliths seen in the anatomic pelvis bilaterally, making evaluation for a distal left ureteral stone difficult.  7. Chest x-ray 2 view on 11/13/2012 showed no acute finding. Chronic interstitial change appears stable compared to prior exam from 03/04/2010.  8. CT scan of the abdomen and pelvis with IV contrast On 11/13/2012 showed chronic lower lobe bronchiectasis and peribronchial disease. Stable left-sided mesenteric lymphadenopathy.  Stable right-sided hydroureteronephrosis. No obstructing ureteral calculi. Stable bladder diverticulum and enlarged prostate gland. No acute abdominal/pelvic findings.  9. Left lower extremity venous duplex ultrasound carried out on 02/05/2013 showed no evidence of left lower extremity deep venous thrombosis. There was no evidence of superficial thrombophlebitis or abnormal fluid collection. 10. CT of abdomen pelvis on 12/22/2013. Increased mesenteric and upper abdominal lymphadenopathy, consistent with progression of lymphoma. Increased dilatation of common bile duct, which could be due to increased porta hepatis and peripancreatic lymphadenopathy, although choledocholithiasis cannot be excluded. Consider MRCP for further evaluation if clinically warranted. Stable markedly enlarged prostate, findings of chronic bladder outlet obstruction, and severe right hydronephrosis. Stable large right inguinal hernia containing only fat. Severe left-sided diverticulosis. No definite radiographic evidence of acute diverticulitis.  11. CT of abdomen pelvis on 04/06/2014. Stable upper abdominal/mesenteric lymphadenopathy, as above. Severe right hydroureteronephrosis, unchanged. Prostatomegaly. Status post cholecystectomy with stable intrahepatic/extrahepatic ductal dilatation, likely postsurgical. Additional stable ancillary findings above.  ASSESSMENT: Daniel [name] 80 y.o. male with a history of Lymphoma, small lymphocytic - Plan: CBC with Differential, Lactate dehydrogenase (LDH) - [facility], Basic metabolic panel (Bmet) - [facility]  Cough  PLAN:   1. Small lymphocytic lymphoma. --Mr. Defranco's condition remained stable. His last CT scan of the abdomen and pelvis demonstrated stable disease.   --We will go ahead with another course of treatment with Rituxan 600 mg and Decadron 20 mg IV. We will continue Rituxan every two months. We will repeat scans in 3-4 months or based on symptoms.    2. Follow-up. --Mr. Subramaniam will  return in two month at which time we will check CBC and chemistries, and receive rituximab.   All questions were answered. The patient knows to call the clinic with any problems, questions or concerns. We can certainly see the patient much sooner if necessary.  I spent 10 minutes counseling the patient face to face. The total time spent in the appointment was 15 minutes.    Zailey Audia, MD 07/07/2014 1:00 PM

## 2014-07-07 NOTE — Telephone Encounter (Signed)
gv and prnted appt sched and avs for pt for SEpt and NOV...sed added tx.

## 2014-07-07 NOTE — Patient Instructions (Signed)
[facility] Discharge Instructions for Patients Receiving Chemotherapy  Today you received the following chemotherapy agents: rituxan  To help prevent nausea and vomiting after your treatment, we encourage you to take your nausea medication.  Take it as often as prescribed.     If you develop nausea and vomiting that is not controlled by your nausea medication, call the clinic. If it is after clinic hours your family physician or the after hours number for the clinic or go to the Emergency Department.   BELOW ARE SYMPTOMS THAT SHOULD BE REPORTED IMMEDIATELY:  *FEVER GREATER THAN 100.5 F  *CHILLS WITH OR WITHOUT FEVER  NAUSEA AND VOMITING THAT IS NOT CONTROLLED WITH YOUR NAUSEA MEDICATION  *UNUSUAL SHORTNESS OF BREATH  *UNUSUAL BRUISING OR BLEEDING  TENDERNESS IN MOUTH AND THROAT WITH OR WITHOUT PRESENCE OF ULCERS  *URINARY PROBLEMS  *BOWEL PROBLEMS  UNUSUAL RASH Items with * indicate a potential emergency and should be followed up as soon as possible.  Feel free to call the clinic you have any questions or concerns. The clinic phone number is [phone].   I have been informed and understand all the instructions given to me. I know to contact the clinic, my physician, or go to the Emergency Department if any problems should occur. I do not have any questions at this time, but understand that I may call the clinic during office hours   should I have any questions or need assistance in obtaining follow up care.    __________________________________________  _____________  __________ Signature of Patient or Authorized Representative            Date                   Time    __________________________________________ Nurse's Signature    

## 2014-07-07 NOTE — Telephone Encounter (Signed)
gv and printed appt sched and avs for pt for Sept and NOV

## 2014-07-07 NOTE — Patient Instructions (Signed)
Dextromethorphan oral solution and syrup What is this medicine? DEXTROMETHORPHAN (dex troe meth OR fan) is used to help relieve cough. This medicine may be used for other purposes; ask your health care provider or pharmacist if you have questions. COMMON BRAND NAME(S): Cough Suppressant, ElixSure Cough, ElixSure Cough DM, PediaCare Children's Long Acting Cough, PediaCare Long-Acting Cough, Robitussin, Robitussin Children's Cough, Robitussin Cough, Robitussin Lingering Cold Long-Acting Cough, Robitussin Pediatric Cough, Scot-Tussin CF, Silphen DM, Triaminic Long Acting Cough, Tylenol Children's Simply Cough, Vicks DayQuil Cough, Vicks DayQuil Nature Fusion, Vicks Formula 44, Vicks Nature Fusion Cough, Zicam Concentrated Cough, Zicam Cough Max, Zicam Cough Nite What should I tell my health care provider before I take this medicine? They need to know if you have any of these conditions: -asthma -emphysema -large amount of mucus -liver disease -smoker -an unusual or allergic reaction to dextromethorphan, other medicines, bromides, foods, dyes, or preservatives -pregnant or trying to get pregnant -breast-feeding How should I use this medicine? Take this medicine by mouth. Follow the directions on the prescription label. Use a specially marked spoon or container to measure each dose. Ask your pharmacist if you do not have one. Household spoons are not accurate. Take your medicine at regular intervals. Do not take it more often than directed. Talk to your pediatrician regarding the use of this medicine in children. While this drug may be prescribed for children as young as 74 years old for selected conditions, precautions do apply. Overdosage: If you think you have taken too much of this medicine contact a poison control center or emergency room at once. NOTE: This medicine is only for you. Do not share this medicine with others. What if I miss a dose? If you miss a dose, take it as soon as you can. If  it is almost time for your next dose, take only that dose. Do not take double or extra doses. What may interact with this medicine? Do not take this medicine with any of the following medications: -MAOIs like Carbex, Eldepryl, Marplan, Nardil, and Parnate This medicine may also interact with the following medications: -medicines for depression, anxiety, or psychotic disturbances -other medicines for allergies or cold -procarbazine This list may not describe all possible interactions. Give your health care provider a list of all the medicines, herbs, non-prescription drugs, or dietary supplements you use. Also tell them if you smoke, drink alcohol, or use illegal drugs. Some items may interact with your medicine. What should I watch for while using this medicine? Do not treat yourself for a cough for more than 1 week without consulting your doctor or health care professional. If you have a high fever, skin rash, lasting headache, or sore throat, see your doctor. You may get drowsy or dizzy. Do not drive, use machinery, or do anything that needs mental alertness until you know how this medicine affects you. Do not stand or sit up quickly, especially if you are an older patient. This reduces the risk of dizzy or fainting spells. Alcohol may interfere with the effect of this medicine. Avoid alcoholic drinks. What side effects may I notice from receiving this medicine? Side effects that you should report to your doctor or health care professional as soon as possible: -allergic reactions like skin rash, itching or hives, swelling of the face, lips, or tongue -breathing problems -confusion -excitement, nervousness, restlessness, or irritability -seizure -slurred speech Side effects that usually do not require medical attention (report to your doctor or health care professional if they continue or are  bothersome): -headache -stomach upset -tiredness This list may not describe all possible side  effects. Call your doctor for medical advice about side effects. You may report side effects to FDA at 1-800-FDA-1088. Where should I keep my medicine? Keep out of the reach of children. Store at room temperature between 20 and 25 degrees C (68 and 77 degrees F) unless otherwise directed. Protect from light. Do not freeze. Throw away any unused medicine after the expiration date. NOTE: This sheet is a summary. It may not cover all possible information. If you have questions about this medicine, talk to your doctor, pharmacist, or health care provider.  2015, Elsevier/Gold Standard. (2008-03-25 15:44:59) Cough, Adult  A cough is a reflex that helps clear your throat and airways. It can help heal the body or may be a reaction to an irritated airway. A cough may only last 2 or 3 weeks (acute) or may last more than 8 weeks (chronic).  CAUSES Acute cough:  Viral or bacterial infections. Chronic cough:  Infections.  Allergies.  Asthma.  Post-nasal drip.  Smoking.  Heartburn or acid reflux.  Some medicines.  Chronic lung problems (COPD).  Cancer. SYMPTOMS   Cough.  Fever.  Chest pain.  Increased breathing rate.  High-pitched whistling sound when breathing (wheezing).  Colored mucus that you cough up (sputum). TREATMENT   A bacterial cough may be treated with antibiotic medicine.  A viral cough must run its course and will not respond to antibiotics.  Your caregiver may recommend other treatments if you have a chronic cough. HOME CARE INSTRUCTIONS   Only take over-the-counter or prescription medicines for pain, discomfort, or fever as directed by your caregiver. Use cough suppressants only as directed by your caregiver.  Use a cold steam vaporizer or humidifier in your bedroom or home to help loosen secretions.  Sleep in a semi-upright position if your cough is worse at night.  Rest as needed.  Stop smoking if you smoke. SEEK IMMEDIATE MEDICAL CARE IF:   You  have pus in your sputum.  Your cough starts to worsen.  You cannot control your cough with suppressants and are losing sleep.  You begin coughing up blood.  You have difficulty breathing.  You develop pain which is getting worse or is uncontrolled with medicine.  You have a fever. MAKE SURE YOU:   Understand these instructions.  Will watch your condition.  Will get help right away if you are not doing well or get worse. Document Released: 05/24/2011 Document Revised: 02/17/2012 Document Reviewed: 05/24/2011 [facility] Patient Information 2015 [place], [place]. This information is not intended to replace advice given to you by your health care provider. Make sure you discuss any questions you have with your health care provider.

## 2014-09-01 ENCOUNTER — Ambulatory Visit (HOSPITAL_BASED_OUTPATIENT_CLINIC_OR_DEPARTMENT_OTHER): Payer: Medicare Other

## 2014-09-01 ENCOUNTER — Other Ambulatory Visit: Payer: Self-pay | Admitting: Hematology

## 2014-09-01 ENCOUNTER — Ambulatory Visit (HOSPITAL_BASED_OUTPATIENT_CLINIC_OR_DEPARTMENT_OTHER): Payer: Medicare Other | Admitting: Physician Assistant

## 2014-09-01 ENCOUNTER — Telehealth: Payer: Self-pay | Admitting: Physician Assistant

## 2014-09-01 ENCOUNTER — Other Ambulatory Visit: Payer: Self-pay | Admitting: Oncology

## 2014-09-01 ENCOUNTER — Other Ambulatory Visit (HOSPITAL_BASED_OUTPATIENT_CLINIC_OR_DEPARTMENT_OTHER): Payer: Medicare Other

## 2014-09-01 ENCOUNTER — Telehealth: Payer: Self-pay | Admitting: Hematology

## 2014-09-01 ENCOUNTER — Telehealth: Payer: Self-pay | Admitting: *Deleted

## 2014-09-01 ENCOUNTER — Encounter: Payer: Self-pay | Admitting: Physician Assistant

## 2014-09-01 VITALS — BP 134/60 | HR 78 | Temp 97.8°F | Resp 20 | Ht 65.0 in | Wt 140.2 lb

## 2014-09-01 VITALS — BP 104/43 | HR 58 | Temp 97.5°F | Resp 18

## 2014-09-01 DIAGNOSIS — Z5112 Encounter for antineoplastic immunotherapy: Secondary | ICD-10-CM

## 2014-09-01 DIAGNOSIS — C8300 Small cell B-cell lymphoma, unspecified site: Secondary | ICD-10-CM

## 2014-09-01 DIAGNOSIS — C8583 Other specified types of non-Hodgkin lymphoma, intra-abdominal lymph nodes: Secondary | ICD-10-CM

## 2014-09-01 LAB — CBC WITH DIFFERENTIAL/PLATELET
BASO%: 0.2 % (ref 0.0–2.0)
BASOS ABS: 0 10*3/uL (ref 0.0–0.1)
EOS ABS: 0.2 10*3/uL (ref 0.0–0.5)
EOS%: 1.6 % (ref 0.0–7.0)
HCT: 41 % (ref 38.4–49.9)
HEMOGLOBIN: 13.2 g/dL (ref 13.0–17.1)
LYMPH%: 32 % (ref 14.0–49.0)
MCH: 30.2 pg (ref 27.2–33.4)
MCHC: 32.2 g/dL (ref 32.0–36.0)
MCV: 93.8 fL (ref 79.3–98.0)
MONO#: 2.1 10*3/uL — ABNORMAL HIGH (ref 0.1–0.9)
MONO%: 20.6 % — ABNORMAL HIGH (ref 0.0–14.0)
NEUT%: 45.6 % (ref 39.0–75.0)
NEUTROS ABS: 4.7 10*3/uL (ref 1.5–6.5)
PLATELETS: 152 10*3/uL (ref 140–400)
RBC: 4.37 10*6/uL (ref 4.20–5.82)
RDW: 15.5 % — ABNORMAL HIGH (ref 11.0–14.6)
WBC: 10.2 10*3/uL (ref 4.0–10.3)
lymph#: 3.3 10*3/uL (ref 0.9–3.3)
nRBC: 0 % (ref 0–0)

## 2014-09-01 LAB — BASIC METABOLIC PANEL (CC13)
Anion Gap: 10 mEq/L (ref 3–11)
BUN: 22.6 mg/dL (ref 7.0–26.0)
CO2: 24 meq/L (ref 22–29)
CREATININE: 1.2 mg/dL (ref 0.7–1.3)
Calcium: 9.6 mg/dL (ref 8.4–10.4)
Chloride: 108 mEq/L (ref 98–109)
Glucose: 116 mg/dl (ref 70–140)
Potassium: 3.9 mEq/L (ref 3.5–5.1)
Sodium: 142 mEq/L (ref 136–145)

## 2014-09-01 LAB — TECHNOLOGIST REVIEW

## 2014-09-01 LAB — LACTATE DEHYDROGENASE (CC13): LDH: 262 U/L — ABNORMAL HIGH (ref 125–245)

## 2014-09-01 MED ORDER — DIPHENHYDRAMINE HCL 25 MG PO CAPS
25.0000 mg | ORAL_CAPSULE | Freq: Once | ORAL | Status: AC
  Administered 2014-09-01: 25 mg via ORAL

## 2014-09-01 MED ORDER — FAMOTIDINE IN NACL 20-0.9 MG/50ML-% IV SOLN
INTRAVENOUS | Status: AC
  Filled 2014-09-01: qty 50

## 2014-09-01 MED ORDER — DIPHENHYDRAMINE HCL 25 MG PO CAPS
ORAL_CAPSULE | ORAL | Status: AC
  Filled 2014-09-01: qty 1

## 2014-09-01 MED ORDER — ACETAMINOPHEN 325 MG PO TABS
650.0000 mg | ORAL_TABLET | Freq: Once | ORAL | Status: AC
  Administered 2014-09-01: 650 mg via ORAL

## 2014-09-01 MED ORDER — ONDANSETRON 8 MG/NS 50 ML IVPB
INTRAVENOUS | Status: AC
  Filled 2014-09-01: qty 8

## 2014-09-01 MED ORDER — FAMOTIDINE IN NACL 20-0.9 MG/50ML-% IV SOLN
20.0000 mg | Freq: Once | INTRAVENOUS | Status: AC
  Administered 2014-09-01: 20 mg via INTRAVENOUS

## 2014-09-01 MED ORDER — SODIUM CHLORIDE 0.9 % IV SOLN
375.0000 mg/m2 | Freq: Once | INTRAVENOUS | Status: AC
  Administered 2014-09-01: 600 mg via INTRAVENOUS
  Filled 2014-09-01: qty 60

## 2014-09-01 MED ORDER — ACETAMINOPHEN 325 MG PO TABS
ORAL_TABLET | ORAL | Status: AC
  Filled 2014-09-01: qty 2

## 2014-09-01 MED ORDER — DEXAMETHASONE SODIUM PHOSPHATE 10 MG/ML IJ SOLN
20.0000 mg | Freq: Once | INTRAMUSCULAR | Status: AC
  Administered 2014-09-01: 20 mg via INTRAVENOUS

## 2014-09-01 MED ORDER — ONDANSETRON 8 MG/50ML IVPB (CHCC)
8.0000 mg | Freq: Once | INTRAVENOUS | Status: AC
  Administered 2014-09-01: 8 mg via INTRAVENOUS

## 2014-09-01 MED ORDER — SODIUM CHLORIDE 0.9 % IV SOLN
Freq: Once | INTRAVENOUS | Status: AC
  Administered 2014-09-01: 11:00:00 via INTRAVENOUS

## 2014-09-01 MED ORDER — DEXAMETHASONE SODIUM PHOSPHATE 20 MG/5ML IJ SOLN
INTRAMUSCULAR | Status: AC
  Filled 2014-09-01: qty 5

## 2014-09-01 NOTE — Progress Notes (Signed)
Daniel [name], Daniel [name]  DIAGNOSIS: Lymphoma, small lymphocytic - Plan: CBC with Differential, Comprehensive metabolic panel (Cmet) - [facility], Lactate dehydrogenase (LDH) - [facility], CT Abdomen Pelvis W Contrast  No chief complaint on file.   PRIOR THERAPY: -Rituxan 375 mg/meter squared, equal to 600 mg IV, had been administered from 05/19/2012 through 12/17/2012. Starting 12/17/2012, the patient is receiving Rituxan 600 mg IV every 2 months. Decadron 20 mg IV every 2 months with Rituxan.  Last received 05/10/2014 .   CURRENT THERAPY: -Rituxan 375 mg/meter squared plus decadron 20 mg IV every one month since 12/15/2013. Restarted every 2 months on 05/10/2014.  INTERVAL HISTORY: Daniel [name] 80 y.o. male with a history of small lymphocytic B cell non-Hodgkin's lymphoma is here for followup. He was last seen by Daniel [name] on 06/09/2014.   His lymphoma presented as a large abdominal mass involving the mesentery. He was diagnosed in May 2013 with stage IV disease on the basis of peripheral blood and bone marrow involvement.. At this time he received Rituxan 600 mg and Decadron 20 mg IV. He's had no major problems with these infusions. He was lost to followup for about six months and presented for follow up in 12/15/2013. Due to his symptoms including abdominal fullness and discomfort, we restarted monthly rituxan for several months.   The patient states that he has improved energy. He denies any abdominal pain any changes in his stools. His weight is stable. He's very active and drives himself to and from his appointments. He has a wife of 95 years who is in rehab. He reports a chronic cough that's nonproductive over the last 3-4 years. He does have an inhaler that he uses. He is tolerating the Rituxan without difficulty.  MEDICAL HISTORY: Past Medical History  Diagnosis Date  . HOH (hard of hearing)   .  Diverticulosis   . BPH (benign prostatic hyperplasia)   . Congenital malformation of spine     T12  . Hx of hydronephrosis   . NHL (non-Hodgkin's lymphoma)     nhl dx 5/09;  . History of kidney stones 2013  . Cough for last 2 years    occasional white sputum  . Sleep apnea   . Prostate enlargement   . Non-Hodgkin lymphoma     INTERIM HISTORY: has CHRONIC AIRWAY OBSTRUCTION NEC; ACHALASIA; COUGH; Lymphoma, small lymphocytic; Hydroureteronephrosis; Bronchiectasis without acute exacerbation; and Cellulitis of arm, right on his problem list.    ALLERGIES:  has No Known Allergies.  MEDICATIONS: has a current medication list which includes the following prescription(s): finasteride, fish oil-omega-3 fatty acids, furosemide, glucosamine-chondroitin, carboxymethylcell-hypromellose, lansoprazole, and multiple vitamins-minerals.  SURGICAL HISTORY:  Past Surgical History  Procedure Laterality Date  . Prostate surgery  yrs ago  . Eye surgery  yrs ago    Implants bilateral  . Inguinal hernia repair  yrs ago    left  . Cholecystectomy  yrs ago  . Esophagogastroduodenoscopy (egd) with propofol N/A 07/15/2013    Procedure: ESOPHAGOGASTRODUODENOSCOPY (EGD) WITH PROPOFOL;  Surgeon: Milus Banister, Daniel;  Location: [facility];  Service: Endoscopy;  Laterality: N/A;  botox injection  . Botox injection N/A 07/15/2013    Procedure: BOTOX INJECTION;  Surgeon: Milus Banister, Daniel;  Location: [facility];  Service: Endoscopy;  Laterality: N/A;   PROBLEM LIST:  1. Small lymphocytic B-cell non-Hodgkin lymphoma presenting with a large abdominal mass in the mesentery. Core needle  biopsy on 04/21/2008 showed coexpression of CD5 and CD20, also CD19, CD21, and CD22. CD10 was negative. The patient had involvement of the peripheral blood and bone marrow when he was evaluated at [facility] in early June 2009. The patient initially was treated with Rituxan and fludarabine, 4 cycles  from 05/31/2008 through 08/22/2008. The patient had an excellent response to his induction treatment with Rituxan and fludarabine. Beginning in the fall of 2009, the patient was maintained on Rituxan every 3 months. However, a CT scan of the abdomen and pelvis carried out without IV contrast on 05/12/2012 showed mild progression of mesenteric adenopathy when compared with the CT scan of the abdomen and pelvis carried out on 04/15/2011. On 05/19/2012, the patient began receiving Rituxan on a monthly basis. It should be noted that Daniel [name] underwent hepatitis screening on 10/18/2010. This consisted of hepatitis B surface antibody, hepatitis B surface antigen, hepatitis B core antibody total, and hepatitis core antibody IgM. All of these were negative. The patient had been receiving monthly Rituxan since 05/19/2012. CT scan of the abdomen and pelvis carried out on 11/13/2012 showed no significant changes, possibly even some improvement. Rituxan was being given every 2 months as of 12/17/2012.  2. Chronic cough possibly related to GERD.  3. Achalasia.  4. COPD.  5. History of kidney stones.  6. History of elevated PSA.  7. BPH.  8. Hearing impairment.  9. Sun related damage of skin.  10. Diverticulosis.  11. Multiple colonic polyps.  12. Macular degeneration, under the care of Dr. Monna Fam.  13. Lithotripsy carried out on an obstructing distal left ureteral stone on 05/18/2012 by Dr. Franchot Gallo.  REVIEW OF SYSTEMS:   Constitutional: Denies fevers, chills or abnormal weight loss Eyes: Denies blurriness of vision Ears, nose, mouth, throat, and face: Denies mucositis or sore throat Respiratory: Denies cough, dyspnea or wheezes Cardiovascular: Denies palpitation, chest discomfort or lower extremity swelling Gastrointestinal:  Denies nausea, heartburn or change in bowel habits Skin: Denies abnormal skin rashes; + r arm redness Lymphatics: Denies new lymphadenopathy or easy  bruising Neurological:Denies numbness, tingling or new weaknesses Behavioral/Psych: Mood is stable, no new changes  All other systems were reviewed with the patient and are negative.  PHYSICAL EXAMINATION: ECOG PERFORMANCE STATUS: 0 - Asymptomatic  Blood pressure 134/60, pulse 78, temperature 97.8 F (36.6 C), temperature source Oral, resp. rate 20, height 5\' 5"  (1.651 m), weight 140 lb 3.2 oz (63.594 kg).  GENERAL:alert, no distress and comfortable; a pleasant mentally sharp elderly male. HOH. SKIN: skin color, texture, turgor are normal, no rashes, large cystic lesion over his central  forehead, unchanged.  EYES: normal, Conjunctiva are pink and non-injected, sclera clear OROPHARYNX:no exudate, no erythema and lips, buccal mucosa, and tongue normal  NECK: supple, thyroid normal size, non-tender, without nodularity LYMPH:  no palpable lymphadenopathy in the cervical, axillary or supraclavicular LUNGS:  generalized expiratory wheezing, no accessory muscle use HEART: regular rate & rhythm and no murmurs and 1+ extremity edema bilaterally ABDOMEN:abdomen soft, non-tender and normal bowel sounds; large supero-umbilical hernia., easily reducible  Musculoskeletal:no cyanosis of digits and no clubbing  NEURO: alert & oriented x 3 with fluent speech, no focal motor/sensory deficits; he ambulates with a cane.  Labs:  Lab Results  Component Value Date   WBC 10.2 09/01/2014   HGB 13.2 09/01/2014   HCT 41.0 09/01/2014   MCV 93.8 09/01/2014   PLT 152 09/01/2014   NEUTROABS 4.7 09/01/2014  Chemistry      Component Value Date/Time   NA 142 09/01/2014 0826   NA 139 05/27/2013 1338   NA 142 05/12/2012 0739   K 3.9 09/01/2014 0826   K 4.6 05/27/2013 1338   K 4.5 05/12/2012 0739   CL 104 05/27/2013 1338   CL 103 04/19/2013 0828   CL 100 05/12/2012 0739   CO2 24 09/01/2014 0826   CO2 30 05/27/2013 1338   CO2 30 05/12/2012 0739   BUN 22.6 09/01/2014 0826   BUN 19 05/27/2013 1338   BUN 24* 05/12/2012 0739    CREATININE 1.2 09/01/2014 0826   CREATININE 1.13 05/27/2013 1338   CREATININE 1.6* 05/12/2012 0739      Component Value Date/Time   CALCIUM 9.6 09/01/2014 0826   CALCIUM 9.4 05/27/2013 1338   CALCIUM 8.7 05/12/2012 0739   ALKPHOS 97 06/09/2014 0826   ALKPHOS 151* 05/27/2013 1338   ALKPHOS 109* 05/12/2012 0739   AST 29 06/09/2014 0826   AST 195* 05/27/2013 1338   AST 22 05/12/2012 0739   ALT 26 06/09/2014 0826   ALT 174* 05/27/2013 1338   ALT 27 05/12/2012 0739   BILITOT 0.65 06/09/2014 0826   BILITOT 0.6 05/27/2013 1338   BILITOT 0.80 05/12/2012 0739      CBC:  Recent Labs Lab 09/01/14 0825  WBC 10.2  NEUTROABS 4.7  HGB 13.2  HCT 41.0  MCV 93.8  PLT 152    Studies:  No results found.   RADIOGRAPHIC STUDIES: 1. CT scan of the abdomen and pelvis with IV contrast from 03/04/2010 showed central mesenteric adenopathy. The largest left anterior mesenteric lymph node measures 2.5 x 1.2 cm, felt to be stable in size compared with the prior exam of 03/22/2009. No new adenopathy was noted. There was marked enlargement of the prostate gland with indentation of the urinary bladder base. There is a diverticulum in the right lateral aspect of the urinary bladder, felt to be stable. There was a stable compression fracture of T12 vertebral body. There was patchy airspace disease in the right lower lobe  posteriorly, felt to be due to either infection or inflammatory process.  2. Chest x-ray, 2 view, from 02/15/2011 showed stable chronic bibasilar interstitial disease compared with the chest x-ray of 03/04/2010. No acute findings.  3. CT of the abdomen and pelvis with IV contrast from 04/15/2011 showed progression of peribronchial interstitial thickening within the left and right lower lobes. There was mild interval increase in adenopathy within the central small bowel mesentery. There was increased hydroureter and mild hydronephrosis on the right. A lymph node in the left abdomen measures 18 x 29 mm, similar to 15 x  27 mm on the prior study of 03/04/2010. It was felt that there might be more lymph nodes notable within the mesentery. In the pelvis, there were no enlarged lymph nodes. Again, there was marked enlargement of the prostate gland.  4. CT scan of abdomen and pelvis without IV contrast on 05/12/2012 showed progression of mesenteric adenopathy. Specifically an index node now  measures 4.5 x 2.4 cm on image 24 versus 2.4 x 1.3 cm on the prior CT scan of 04/15/2011. There were scattered colonic diverticula. There was no retrocrural or retroperitoneal adenopathy. There was moderate to marked enlargement of the prostate with some mild bladder wall thickening and a right-sided bladder diverticulum. There was fat-containing ventral abdominal wall hernia. There was development of moderate left-sided hydroureteronephrosis secondary to a distal left ureteric stone. There was similar right-sided  hydroureteronephrosis, felt to be due to prostatic enlargement and bladder outlet obstruction. There was improved appearance at the lung bases. T12 compression deformity was unchanged and there was evidence of prior cholecystectomy. There was a tiny hiatal hernia. 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 Heart size was normal without pericardial or pleural effusion.  5. One-view abdomen on 05/18/2012 showed a 1 cm stone in the distal aspect of the left ureter, felt to be unchanged compared with the abdominal CT scan of 05/12/2012.  6. One-view abdomen on 08/03/2012 showed possible distal left ureteral stone measuring about 6 mm along the left lateral margin of the lower sacrum. There are numerous phleboliths seen in the anatomic pelvis bilaterally, making evaluation for a distal left ureteral stone difficult.  7. Chest x-ray 2 view on 11/13/2012 showed no acute finding. Chronic interstitial change appears stable compared to prior exam from 03/04/2010.  8. CT scan of the abdomen and pelvis with IV contrast On 11/13/2012  showed chronic lower lobe bronchiectasis and peribronchial disease. Stable left-sided mesenteric lymphadenopathy. Stable right-sided hydroureteronephrosis. No obstructing ureteral calculi. Stable bladder diverticulum and enlarged prostate gland. No acute abdominal/pelvic findings.  9. Left lower extremity venous duplex ultrasound carried out on 02/05/2013 showed no evidence of left lower extremity deep venous thrombosis. There was no evidence of superficial thrombophlebitis or abnormal fluid collection. 10. CT of abdomen pelvis on 12/22/2013. Increased mesenteric and upper abdominal lymphadenopathy, consistent with progression of lymphoma. Increased dilatation of common bile duct, which could be due to increased porta hepatis and peripancreatic lymphadenopathy, although choledocholithiasis cannot be excluded. Consider MRCP for further evaluation if clinically warranted. Stable markedly enlarged prostate, findings of chronic bladder outlet obstruction, and severe right hydronephrosis. Stable large right inguinal hernia containing only fat. Severe left-sided diverticulosis. No definite radiographic evidence of acute diverticulitis.  11. CT of abdomen pelvis on 04/06/2014. Stable upper abdominal/mesenteric lymphadenopathy, as above. Severe right hydroureteronephrosis, unchanged. Prostatomegaly. Status post cholecystectomy with stable intrahepatic/extrahepatic ductal dilatation, likely postsurgical. Additional stable ancillary findings above.  ASSESSMENT: JATHAN BALLING 80 y.o. male with a history of Lymphoma, small lymphocytic - Plan: CBC with Differential, Comprehensive metabolic panel (Cmet) - [facility], Lactate dehydrogenase (LDH) - [facility], CT Abdomen Pelvis W Contrast  PLAN:   1. Small lymphocytic lymphoma. --Daniel [name]'s condition remained stable. His last CT scan of the abdomen and pelvis demonstrated stable disease.   --We will go ahead with another course of treatment with Rituxan 600 mg and Decadron 20 mg IV.  We will continue Rituxan every two months. We will repeat scans in 2 months or based on symptoms.    2. Follow-up. --Daniel [name] will return in two month at which time we will check CBC and chemistries, and receive rituximab. He will return with a restaging Ct scan of the abdomen and pelvis to re-evaluate his disease.   All questions were answered. The patient knows to call the clinic with any problems, questions or concerns. We can certainly see the patient much sooner if necessary.    Daniel Adam, PA-C 09/01/2014 2:54 PM

## 2014-09-01 NOTE — Telephone Encounter (Signed)
Pt confirmed labs/ov per 09/24 POF, sent msg to add chemo, gave pt AVS         KJ °

## 2014-09-01 NOTE — Telephone Encounter (Signed)
Per staff message and POF I have scheduled appts. Advised scheduler of appts. JMW  

## 2014-09-01 NOTE — Telephone Encounter (Signed)
Toni from [facility] cld to see if lab can be moved to 12:30-chgd for her. She will be calling pt for CT sch and will adv of the lab new time

## 2014-09-01 NOTE — Telephone Encounter (Signed)
Pt confirmed labs/ov per 09/24 POF,  gave pt AVS         KJ °

## 2014-09-05 NOTE — Patient Instructions (Signed)
Use your inhaler(s) as prescribed Follow up in 2 months with a restaging Ct scan of your abdomen and pelvis to re-evaluate your disease

## 2014-10-05 ENCOUNTER — Other Ambulatory Visit: Payer: Self-pay | Admitting: Nurse Practitioner

## 2014-10-24 ENCOUNTER — Other Ambulatory Visit (HOSPITAL_BASED_OUTPATIENT_CLINIC_OR_DEPARTMENT_OTHER): Payer: Medicare Other

## 2014-10-24 ENCOUNTER — Other Ambulatory Visit: Payer: Medicare Other

## 2014-10-24 ENCOUNTER — Ambulatory Visit (HOSPITAL_COMMUNITY)
Admission: RE | Admit: 2014-10-24 | Discharge: 2014-10-24 | Disposition: A | Discharge status: Home / Self Care | Payer: Medicare Other | Source: Ambulatory Visit | Attending: Physician Assistant | Admitting: Physician Assistant

## 2014-10-24 DIAGNOSIS — N1330 Unspecified hydronephrosis: Secondary | ICD-10-CM | POA: Diagnosis not present

## 2014-10-24 DIAGNOSIS — K4090 Unilateral inguinal hernia, without obstruction or gangrene, not specified as recurrent: Secondary | ICD-10-CM | POA: Diagnosis not present

## 2014-10-24 DIAGNOSIS — N323 Diverticulum of bladder: Secondary | ICD-10-CM | POA: Insufficient documentation

## 2014-10-24 DIAGNOSIS — N261 Atrophy of kidney (terminal): Secondary | ICD-10-CM | POA: Insufficient documentation

## 2014-10-24 DIAGNOSIS — C8300 Small cell B-cell lymphoma, unspecified site: Secondary | ICD-10-CM

## 2014-10-24 DIAGNOSIS — N134 Hydroureter: Secondary | ICD-10-CM | POA: Diagnosis not present

## 2014-10-24 DIAGNOSIS — K5730 Diverticulosis of large intestine without perforation or abscess without bleeding: Secondary | ICD-10-CM | POA: Insufficient documentation

## 2014-10-24 DIAGNOSIS — K429 Umbilical hernia without obstruction or gangrene: Secondary | ICD-10-CM | POA: Insufficient documentation

## 2014-10-24 DIAGNOSIS — R590 Localized enlarged lymph nodes: Secondary | ICD-10-CM | POA: Diagnosis not present

## 2014-10-24 DIAGNOSIS — C8590 Non-Hodgkin lymphoma, unspecified, unspecified site: Secondary | ICD-10-CM | POA: Diagnosis not present

## 2014-10-24 DIAGNOSIS — Q6261 Deviation of ureter: Secondary | ICD-10-CM | POA: Diagnosis not present

## 2014-10-24 DIAGNOSIS — N400 Enlarged prostate without lower urinary tract symptoms: Secondary | ICD-10-CM | POA: Insufficient documentation

## 2014-10-24 DIAGNOSIS — C8583 Other specified types of non-Hodgkin lymphoma, intra-abdominal lymph nodes: Secondary | ICD-10-CM

## 2014-10-24 LAB — CBC WITH DIFFERENTIAL/PLATELET
BASO%: 1.6 % (ref 0.0–2.0)
Basophils Absolute: 0.3 10*3/uL — ABNORMAL HIGH (ref 0.0–0.1)
EOS ABS: 0.1 10*3/uL (ref 0.0–0.5)
EOS%: 0.9 % (ref 0.0–7.0)
HEMATOCRIT: 45.1 % (ref 38.4–49.9)
HEMOGLOBIN: 14.3 g/dL (ref 13.0–17.1)
LYMPH#: 10.7 10*3/uL — AB (ref 0.9–3.3)
LYMPH%: 64.7 % — ABNORMAL HIGH (ref 14.0–49.0)
MCH: 29.9 pg (ref 27.2–33.4)
MCHC: 31.7 g/dL — ABNORMAL LOW (ref 32.0–36.0)
MCV: 94.1 fL (ref 79.3–98.0)
MONO#: 0.2 10*3/uL (ref 0.1–0.9)
MONO%: 1.4 % (ref 0.0–14.0)
NEUT%: 31.4 % — AB (ref 39.0–75.0)
NEUTROS ABS: 5.2 10*3/uL (ref 1.5–6.5)
Platelets: 165 10*3/uL (ref 140–400)
RBC: 4.79 10*6/uL (ref 4.20–5.82)
RDW: 16.1 % — AB (ref 11.0–14.6)
WBC: 16.5 10*3/uL — ABNORMAL HIGH (ref 4.0–10.3)

## 2014-10-24 LAB — COMPREHENSIVE METABOLIC PANEL (CC13)
ALBUMIN: 3.8 g/dL (ref 3.5–5.0)
ALT: 27 U/L (ref 0–55)
ANION GAP: 13 meq/L — AB (ref 3–11)
AST: 33 U/L (ref 5–34)
Alkaline Phosphatase: 128 U/L (ref 40–150)
BUN: 21.2 mg/dL (ref 7.0–26.0)
CALCIUM: 10 mg/dL (ref 8.4–10.4)
CHLORIDE: 100 meq/L (ref 98–109)
CO2: 29 mEq/L (ref 22–29)
CREATININE: 1.3 mg/dL (ref 0.7–1.3)
GLUCOSE: 95 mg/dL (ref 70–140)
POTASSIUM: 4.8 meq/L (ref 3.5–5.1)
Sodium: 142 mEq/L (ref 136–145)
Total Bilirubin: 0.71 mg/dL (ref 0.20–1.20)
Total Protein: 6.4 g/dL (ref 6.4–8.3)

## 2014-10-24 LAB — LACTATE DEHYDROGENASE (CC13): LDH: 323 U/L — ABNORMAL HIGH (ref 125–245)

## 2014-10-24 LAB — TECHNOLOGIST REVIEW: Technologist Review: 38

## 2014-10-24 MED ORDER — IOHEXOL 300 MG/ML  SOLN
100.0000 mL | Freq: Once | INTRAMUSCULAR | Status: AC | PRN
  Administered 2014-10-24: 80 mL via INTRAVENOUS

## 2014-10-26 ENCOUNTER — Telehealth: Payer: Self-pay | Admitting: Hematology and Oncology

## 2014-10-26 NOTE — Telephone Encounter (Signed)
LM to confirm appt 10/27/14.

## 2014-10-27 ENCOUNTER — Encounter: Payer: Self-pay | Admitting: Hematology and Oncology

## 2014-10-27 ENCOUNTER — Other Ambulatory Visit: Payer: Medicare Other

## 2014-10-27 ENCOUNTER — Ambulatory Visit (HOSPITAL_BASED_OUTPATIENT_CLINIC_OR_DEPARTMENT_OTHER): Payer: Medicare Other | Admitting: Hematology and Oncology

## 2014-10-27 ENCOUNTER — Ambulatory Visit: Payer: Medicare Other

## 2014-10-27 ENCOUNTER — Telehealth: Payer: Self-pay | Admitting: Hematology and Oncology

## 2014-10-27 VITALS — BP 124/64 | HR 86 | Temp 97.7°F | Resp 18 | Ht 65.0 in | Wt 139.2 lb

## 2014-10-27 DIAGNOSIS — C8583 Other specified types of non-Hodgkin lymphoma, intra-abdominal lymph nodes: Secondary | ICD-10-CM

## 2014-10-27 DIAGNOSIS — N1339 Other hydronephrosis: Secondary | ICD-10-CM

## 2014-10-27 DIAGNOSIS — C8300 Small cell B-cell lymphoma, unspecified site: Secondary | ICD-10-CM

## 2014-10-27 DIAGNOSIS — Z23 Encounter for immunization: Secondary | ICD-10-CM

## 2014-10-27 DIAGNOSIS — N1330 Unspecified hydronephrosis: Secondary | ICD-10-CM | POA: Insufficient documentation

## 2014-10-27 HISTORY — DX: Unspecified hydronephrosis: N13.30

## 2014-10-27 LAB — URIC ACID (CC13): Uric Acid, Serum: 6.6 mg/dl (ref 2.6–7.4)

## 2014-10-27 MED ORDER — INFLUENZA VAC SPLIT QUAD 0.5 ML IM SUSY
0.5000 mL | PREFILLED_SYRINGE | Freq: Once | INTRAMUSCULAR | Status: DC
  Filled 2014-10-27: qty 0.5

## 2014-10-27 NOTE — Telephone Encounter (Signed)
Gave avs & cal for Dec. °

## 2014-10-27 NOTE — Progress Notes (Signed)
[name] FOLLOW-UP progress notes  Patient Care Team: [name], MD as PCP - General (Internal Medicine)  CHIEF COMPLAINTS/PURPOSE OF VISIT:  Stage IV CLL  HISTORY OF PRESENTING ILLNESS:  Daniel [name] 80 y.o. male was transferred to my care after his prior physician has left.  I reviewed the patient's records extensive and collaborated the history with the patient. Summary of his history is as follows: This patient was diagnosed with SLL in 2009 after presentation with an abdominal mass. Core needle biopsy on 04/21/2008 showed coexpression of CD5 and CD20, also CD19, CD21, and CD22. CD10 was negative. The patient had involvement of the  peripheral blood and bone marrow when he was evaluated at [facility] in early June 2009. The patient initially was treated with Rituxan and fludarabine, 4  cycles from 05/31/2008 through 08/22/2008. The patient had an excellent response to his induction treatment with Rituxan and fludarabine.  Beginning in the fall of 2009, the patient was maintained on Rituxan every 3 months.  However, a CT scan of the abdomen and pelvis carried out without IV contrast on 05/12/2012 showed mild progression of mesenteric adenopathy when compared with the CT scan of  the abdomen and pelvis carried out on 04/15/2011.  On 05/19/2012, the patient began receiving Rituxan on a monthly basis.  CT scan of the abdomen and pelvis carried out on 11/13/2012 showed no significant changes, possibly even some improvement.  Rituxan is now being given every 2 months as of 12/17/2012.  Repeat imaging study on 10/24/2014 show progression of disease  The patient is feeling fine apart from mild intermittent abdominal discomfort. He denies urinary difficulties. Denies recent infection. His appetite stable. Denies any night sweats. He has mild, chronic nonproductive cough. MEDICAL HISTORY:  Past Medical History  Diagnosis Date  . HOH  (hard of hearing)   . Diverticulosis   . BPH (benign prostatic hyperplasia)   . Congenital malformation of spine     T12  . Hx of hydronephrosis   . NHL (non-Hodgkin's lymphoma)     nhl dx 5/09;  . History of kidney stones 2013  . Cough for last 2 years    occasional white sputum  . Sleep apnea   . Prostate enlargement   . Non-Hodgkin lymphoma   . Hydronephrosis 10/27/2014    SURGICAL HISTORY: Past Surgical History  Procedure Laterality Date  . Prostate surgery  yrs ago  . Eye surgery  yrs ago    Implants bilateral  . Inguinal hernia repair  yrs ago    left  . Cholecystectomy  yrs ago  . Esophagogastroduodenoscopy (egd) with propofol N/A 07/15/2013    Procedure: ESOPHAGOGASTRODUODENOSCOPY (EGD) WITH PROPOFOL;  Surgeon: Milus Banister, MD;  Location: [facility];  Service: Endoscopy;  Laterality: N/A;  botox injection  . Botox injection N/A 07/15/2013    Procedure: BOTOX INJECTION;  Surgeon: Milus Banister, MD;  Location: [facility];  Service: Endoscopy;  Laterality: N/A;    SOCIAL HISTORY: History   Social History  . Marital Status: Married    Spouse Name: N/A    Number of Children: N/A  . Years of Education: N/A   Occupational History  . retired    Social History Main Topics  . Smoking status: Former Smoker -- 0.25 packs/day for 3 years    Types: Pipe    Quit date: 12/10/1959  . Smokeless tobacco: Never Used     Comment: Pt states that he never inhaled.  Pt also states that while in service he smoked cigs x 2 months.   . Alcohol Use: 0.6 oz/week    1 Glasses of wine per week     Comment: occasional  . Drug Use: No  . Sexual Activity: Not on file   Other Topics Concern  . Not on file   Social History Narrative    FAMILY HISTORY: Family History  Problem Relation Age of Onset  . Tuberculosis Mother   . Blindness Father     ALLERGIES:  has No Known Allergies.  MEDICATIONS:  Current Outpatient Prescriptions  Medication Sig Dispense Refill  .  finasteride (PROSCAR) 5 MG tablet Take 5 mg by mouth daily with breakfast.     . fish oil-omega-3 fatty acids 1000 MG capsule Take 1 g by mouth daily with breakfast.     . furosemide (LASIX) 20 MG tablet Take 20 mg by mouth every morning.     Marland Kitchen glucosamine-chondroitin 500-400 MG tablet Take 1 tablet by mouth daily.     . Hypromellose (GENTEAL OP) Place 1 drop into both eyes 2 (two) times daily as needed. Dry eyes    . lansoprazole (PREVACID) 30 MG capsule Take 30 mg by mouth daily.      . Multiple Vitamins-Minerals (ICAPS MV PO) Take 1 tablet by mouth daily.     Current Facility-Administered Medications  Medication Dose Route Frequency Provider Last Rate Last Dose  . Influenza vac split quadrivalent PF (FLUARIX) injection 0.5 mL  0.5 mL Intramuscular Once Heath Lark, MD        REVIEW OF SYSTEMS:   Constitutional: Denies fevers, chills or abnormal night sweats Eyes: Denies blurriness of vision, double vision or watery eyes Ears, nose, mouth, throat, and face: Denies mucositis or sore throat Cardiovascular: Denies palpitation, chest discomfort or lower extremity swelling Gastrointestinal:  Denies nausea, heartburn or change in bowel habits Skin: Denies abnormal skin rashes Lymphatics: Denies new lymphadenopathy or easy bruising Neurological:Denies numbness, tingling or new weaknesses Behavioral/Psych: Mood is stable, no new changes  All other systems were reviewed with the patient and are negative.  PHYSICAL EXAMINATION: ECOG PERFORMANCE STATUS: 1 - Symptomatic but completely ambulatory  Filed Vitals:   10/27/14 0950  BP: 124/64  Pulse: 86  Temp: 97.7 F (36.5 C)  Resp: 18   Filed Weights   10/27/14 0950  Weight: 139 lb 3.2 oz (63.141 kg)    GENERAL:alert, no distress and comfortable. He looks debilitated. SKIN: skin color, texture, turgor are normal, no rashes or significant lesions EYES: normal, conjunctiva are pink and non-injected, sclera clear OROPHARYNX:no exudate,  normal lips, buccal mucosa, and tongue  NECK: supple, thyroid normal size, non-tender, without nodularity LYMPH:  Palpable lymphadenopathy in bilateral inguinal region. LUNGS: clear to auscultation and percussion with normal breathing effort HEART: regular rate & rhythm and no murmurs without lower extremity edema ABDOMEN:abdomen soft, non-tender and normal bowel sounds. Palpable left upper quarter mass. Noted abdominal hernia. Mild splenomegaly. Musculoskeletal:no cyanosis of digits and no clubbing  PSYCH: alert & oriented x 3 with fluent speech NEURO: no focal motor/sensory deficits  LABORATORY DATA:  I have reviewed the data as listed Lab Results  Component Value Date   WBC 16.5* 10/24/2014   HGB 14.3 10/24/2014   HCT 45.1 10/24/2014   MCV 94.1 10/24/2014   PLT 165 10/24/2014    Recent Labs  05/10/14 0849 06/09/14 0826 07/07/14 0854 09/01/14 0826 10/24/14 1233  NA 141 142 140 142 142  K 4.1 4.3 4.1 3.9  4.8  CO2 22 24 24 24 29   GLUCOSE 100 97 162* 116 95  BUN 20.5 20.7 19.6 22.6 21.2  CREATININE 1.2 1.2 1.2 1.2 1.3  CALCIUM 8.8 9.1 9.0 9.6 10.0  PROT 5.4* 5.6*  --   --  6.4  ALBUMIN 3.3* 3.4*  --   --  3.8  AST 18 29  --   --  33  ALT 12 26  --   --  27  ALKPHOS 94 97  --   --  128  BILITOT 0.66 0.65  --   --  0.71    RADIOGRAPHIC STUDIES: I have personally reviewed the radiological images as listed and agreed with the findings in the report. Ct Abdomen Pelvis W Contrast  10/24/2014   CLINICAL DATA:  Restaging of small lymphocytic non-Hodgkin's lymphoma diagnosed in 2009. Prior hernia repair.  EXAM: CT ABDOMEN AND PELVIS WITH CONTRAST  TECHNIQUE: Multidetector CT imaging of the abdomen and pelvis was performed using the standard protocol following bolus administration of intravenous contrast.  CONTRAST:  35mL OMNIPAQUE IOHEXOL 300 MG/ML  SOLN  COMPARISON:  Multiple exams, including 04/06/2014  FINDINGS: Lower chest: Right infrahilar adenopathy with short axis diameter  1.4 cm, tracking along the bronchovascular bundle with associated airway thickening in the right lower lobe. There is wall thickening in the distal esophagus. Anterior epicardial lymph node short axis diameter 0.7 cm, image 8 series 2.  Hepatobiliary: Continued intrahepatic and extrahepatic biliary dilatation with the CBD at approximately 1.1 cm. Gallbladder not seen, presumed cholecystectomy.  Pancreas: Unremarkable  Spleen: Unremarkable  Adrenals/Urinary Tract: Stable fullness of both adrenal glands. Bilateral renal atrophy, left greater than right, with chronic bilateral hydronephrosis and hydroureter with deviation of the ureters surround the iliac vessels, a right posterior Hutch diverticulum of the urinary bladder, and massively enlarged prostate gland possibly leading to bladder outlet obstruction. However, there is excretion of contrast into the collecting systems bilaterally. Small hypodense renal lesions are present bilaterally, similar to prior, technically too small to characterize.  Stomach/Bowel: Descending and sigmoid colon diverticulosis  Vascular/Lymphatic: Massive central mesenteric adenopathy, with 1 dominant conglomerate nodal mass measuring 12.8 by 10.0 cm (formerly 11.6 by 9.6 cm by my measurements). Porta hepatis adenopathy observed, with 1 porta hepatis node measuring 2.4 cm in short axis on image 22 of series 2 (formerly 1.3 cm). Peripancreatic and retroperitoneal lymph nodes are also present, less striking than the mesenteric lymph nodes. In general the mesenteric nodes have increased in size significantly. A lymph node in the left perirectal space measures 1.0 cm in short axis, image 79 of series 2 (previously 0.9 cm).  Reproductive: Marked enlargement the prostate gland, 7.2 by 7.0 cm on image 76 of series 2.  Other: No supplemental non-categorized findings.  Musculoskeletal: Right inguinal hernia contains adipose tissue. A supraumbilical hernia containing adipose tissue has a neck 2.5 cm  1 I would with herniated tissue measuring up to 7.3 cm in width.  There is degenerative loss of articular space in both hips. Bridging fusion of the right sacroiliac joint. Dextroconvex lumbar scoliosis. Butterfly vertebra appearance at T12 similar to prior, versus remote central compression.  IMPRESSION: 1. Worsened massive central mesenteric adenopathy. Worsened porta hepatis adenopathy. 2. Continued marked prominence of the prostate gland. Bilateral hydronephrosis and hydroureter extending down to tortuous iliac vessel cross over points. 3. Right infrahilar adenopathy, partially observed on today' s exam, tracking along the bronchovascular bundle in the right lower lobe where there is airway thickening. 4. Wall thickening  of the distal esophagus potentially due to tumor or esophagitis. 5. Descending and sigmoid colon diverticulosis. 6. Right inguinal and supraumbilical hernias, containing adipose tissue.   Electronically Signed   By: Sherryl Barters M.D.   On: 10/24/2014 16:50    ASSESSMENT & PLAN:  Lymphoma, small lymphocytic The patient have significant disease, palpable abdominal mass, new hydronephrosis and elevated LDH. I recommend repeat biopsy of the abdominal mass to exclude transformation. Although not ideal, given his age, I'm not willing to put him through major excisional biopsy with Gen. surgery. I will see him back in 2 weeks to review test results. I would discontinue rituximab treatment today.  Hydronephrosis He has significant hydronephrosis I am concerned that it will compromise his kidney function. I recommend urology consultation and referral to see if placement of the stent would be appropriate before we pursue additional workup and treatment.   Orders Placed This Encounter  Procedures  . CT Biopsy    Standing Status: Future     Number of Occurrences:      Standing Expiration Date: 01/28/2016    Order Specific Question:  Lab orders requested (DO NOT place separate lab  orders, these will be automatically ordered during procedure specimen collection):    Answer:  Surgical Pathology    Order Specific Question:  Reason for Exam (SYMPTOM  OR DIAGNOSIS REQUIRED)    Answer:  Ned core biopsy of large abdominal mass for lymphoma staging    Order Specific Question:  Preferred imaging location?    Answer:  [facility]  . Flow Cytometry    CLL    Standing Status: Future     Number of Occurrences:      Standing Expiration Date: 12/01/2015  . CBC with Differential    Standing Status: Future     Number of Occurrences:      Standing Expiration Date: 12/01/2015  . Comprehensive metabolic panel    Standing Status: Future     Number of Occurrences:      Standing Expiration Date: 12/01/2015  . Lactate dehydrogenase    Standing Status: Future     Number of Occurrences:      Standing Expiration Date: 12/01/2015  . Uric Acid    Standing Status: Future     Number of Occurrences: 1     Standing Expiration Date: 12/01/2015  . Ambulatory referral to Urology    Referral Priority:  Routine    Referral Type:  Consultation    Referral Reason:  Specialty Services Required    Requested Specialty:  Urology    Number of Visits Requested:  1    All questions were answered. The patient knows to call the clinic with any problems, questions or concerns. I spent 40 minutes counseling the patient face to face. The total time spent in the appointment was 55 minutes and more than 50% was on counseling.     [place], Raysal, MD 10/27/2014 3:15 PM

## 2014-10-27 NOTE — Assessment & Plan Note (Signed)
The patient have significant disease, palpable abdominal mass, new hydronephrosis and elevated LDH. I recommend repeat biopsy of the abdominal mass to exclude transformation. Although not ideal, given his age, I'm not willing to put him through major excisional biopsy with Gen. surgery. I will see him back in 2 weeks to review test results. I would discontinue rituximab treatment today.

## 2014-10-27 NOTE — Assessment & Plan Note (Signed)
He has significant hydronephrosis I am concerned that it will compromise his kidney function. I recommend urology consultation and referral to see if placement of the stent would be appropriate before we pursue additional workup and treatment.

## 2014-10-28 ENCOUNTER — Telehealth: Payer: Self-pay | Admitting: Hematology and Oncology

## 2014-10-28 NOTE — Telephone Encounter (Signed)
Faxed pt medical records to Dr. Diona Fanti  [phone]

## 2014-11-01 ENCOUNTER — Other Ambulatory Visit: Payer: Self-pay | Admitting: Hematology and Oncology

## 2014-11-01 ENCOUNTER — Other Ambulatory Visit: Payer: Self-pay | Admitting: *Deleted

## 2014-11-02 ENCOUNTER — Telehealth: Payer: Self-pay | Admitting: Hematology and Oncology

## 2014-11-02 NOTE — Telephone Encounter (Signed)
s.w. pt and advised on Dec appt d.t. change....pt ok and aware of new d.t

## 2014-11-08 ENCOUNTER — Other Ambulatory Visit: Payer: Self-pay | Admitting: Radiology

## 2014-11-09 ENCOUNTER — Ambulatory Visit (HOSPITAL_COMMUNITY)
Admission: RE | Admit: 2014-11-09 | Discharge: 2014-11-09 | Disposition: A | Discharge status: Home / Self Care | Payer: Medicare Other | Source: Ambulatory Visit | Attending: Hematology and Oncology | Admitting: Hematology and Oncology

## 2014-11-09 ENCOUNTER — Other Ambulatory Visit: Payer: Self-pay | Admitting: Interventional Radiology

## 2014-11-09 ENCOUNTER — Encounter (HOSPITAL_COMMUNITY): Payer: Self-pay

## 2014-11-09 DIAGNOSIS — J449 Chronic obstructive pulmonary disease, unspecified: Secondary | ICD-10-CM | POA: Diagnosis not present

## 2014-11-09 DIAGNOSIS — C8300 Small cell B-cell lymphoma, unspecified site: Secondary | ICD-10-CM | POA: Diagnosis present

## 2014-11-09 DIAGNOSIS — N131 Hydronephrosis with ureteral stricture, not elsewhere classified: Secondary | ICD-10-CM | POA: Insufficient documentation

## 2014-11-09 LAB — PROTIME-INR
INR: 0.94 (ref 0.00–1.49)
Prothrombin Time: 12.6 seconds (ref 11.6–15.2)

## 2014-11-09 LAB — CBC WITH DIFFERENTIAL/PLATELET
BAND NEUTROPHILS: 0 % (ref 0–10)
BASOS ABS: 0 10*3/uL (ref 0.0–0.1)
BASOS PCT: 0 % (ref 0–1)
Blasts: 0 %
Eosinophils Absolute: 0 10*3/uL (ref 0.0–0.7)
Eosinophils Relative: 0 % (ref 0–5)
HEMATOCRIT: 40.2 % (ref 39.0–52.0)
HEMOGLOBIN: 12.9 g/dL — AB (ref 13.0–17.0)
LYMPHS ABS: 11.6 10*3/uL — AB (ref 0.7–4.0)
LYMPHS PCT: 77 % — AB (ref 12–46)
MCH: 30.9 pg (ref 26.0–34.0)
MCHC: 32.1 g/dL (ref 30.0–36.0)
MCV: 96.4 fL (ref 78.0–100.0)
METAMYELOCYTES PCT: 0 %
MONO ABS: 0.2 10*3/uL (ref 0.1–1.0)
MONOS PCT: 1 % — AB (ref 3–12)
Myelocytes: 0 %
Neutro Abs: 3.3 10*3/uL (ref 1.7–7.7)
Neutrophils Relative %: 22 % — ABNORMAL LOW (ref 43–77)
Platelets: 145 10*3/uL — ABNORMAL LOW (ref 150–400)
Promyelocytes Absolute: 0 %
RBC: 4.17 MIL/uL — ABNORMAL LOW (ref 4.22–5.81)
RDW: 16 % — AB (ref 11.5–15.5)
WBC: 15.1 10*3/uL — ABNORMAL HIGH (ref 4.0–10.5)
nRBC: 0 /100 WBC

## 2014-11-09 LAB — APTT: aPTT: 24 seconds (ref 24–37)

## 2014-11-09 MED ORDER — FENTANYL CITRATE 0.05 MG/ML IJ SOLN
INTRAMUSCULAR | Status: AC | PRN
  Administered 2014-11-09: 25 ug via INTRAVENOUS

## 2014-11-09 MED ORDER — MIDAZOLAM HCL 2 MG/2ML IJ SOLN
INTRAMUSCULAR | Status: AC
Start: 2014-11-09 — End: 2014-11-09
  Filled 2014-11-09: qty 6

## 2014-11-09 MED ORDER — MIDAZOLAM HCL 2 MG/2ML IJ SOLN
INTRAMUSCULAR | Status: AC | PRN
  Administered 2014-11-09: 1 mg via INTRAVENOUS

## 2014-11-09 MED ORDER — SODIUM CHLORIDE 0.9 % IV SOLN
INTRAVENOUS | Status: DC
  Administered 2014-11-09: 07:00:00 via INTRAVENOUS

## 2014-11-09 MED ORDER — FENTANYL CITRATE 0.05 MG/ML IJ SOLN
INTRAMUSCULAR | Status: AC
  Filled 2014-11-09: qty 4

## 2014-11-09 NOTE — Procedures (Signed)
Technically successful CT guided biopsy of peritoneal lymphadenopathy.  No immediate post procedural complications.

## 2014-11-09 NOTE — Discharge Instructions (Signed)
Needle Biopsy °Care After °These instructions give you information on caring for yourself after your procedure. Your doctor may also give you more specific instructions. Call your doctor if you have any problems or questions after your procedure. °HOME CARE °· Rest for 4 hours after your biopsy, except for getting up to go to the bathroom or as told. °· Keep the places where the needles were put in clean and dry. °¨ Do not put powder or lotion on the sites. °¨ Do not shower until 24 hours after the test. Remove all bandages (dressings) before showering. °¨ Remove all bandages at least once every day. Gently clean the sites with soap and water. Keep putting a new bandage on until the skin is closed. °Finding out the results of your test °Ask your doctor when your test results will be ready. Make sure you follow up and get the test results. °GET HELP RIGHT AWAY IF:  °· You have shortness of breath or trouble breathing. °· You have pain or cramping in your belly (abdomen). °· You feel sick to your stomach (nauseous) or throw up (vomit). °· Any of the places where the needles were put in: °¨ Are puffy (swollen) or red. °¨ Are sore or hot to the touch. °¨ Are draining yellowish-white fluid (pus). °¨ Are bleeding after 10 minutes of pressing down on the site. Have someone keep pressing on any place that is bleeding until you see a doctor. °· You have any unusual pain that will not stop. °· You have a fever. °If you go to the emergency room, tell the nurse that you had a biopsy. Take this paper with you to show the nurse. °MAKE SURE YOU:  °· Understand these instructions. °· Will watch your condition. °· Will get help right away if you are not doing well or get worse. °Document Released: 11/07/2008 Document Revised: 02/17/2012 Document Reviewed: 11/07/2008 °ExitCare® Patient Information ©2015 [employer]. This information is not intended to replace advice given to you by your health care provider. Make sure you discuss  any questions you have with your health care provider. °Conscious Sedation °Sedation is the use of medicines to promote relaxation and relieve discomfort and anxiety. Conscious sedation is a type of sedation. Under conscious sedation you are less alert than normal but are still able to respond to instructions or stimulation. Conscious sedation is used during short medical and dental procedures. It is milder than deep sedation or general anesthesia and allows you to return to your regular activities sooner.  °LET YOUR HEALTH CARE PROVIDER KNOW ABOUT:  °· Any allergies you have. °· All medicines you are taking, including vitamins, herbs, eye drops, creams, and over-the-counter medicines. °· Use of steroids (by mouth or creams). °· Previous problems you or members of your family have had with the use of anesthetics. °· Any blood disorders you have. °· Previous surgeries you have had. °· Medical conditions you have. °· Possibility of pregnancy, if this applies. °· Use of cigarettes, alcohol, or illegal drugs. °RISKS AND COMPLICATIONS °Generally, this is a safe procedure. However, as with any procedure, problems can occur. Possible problems include: °· Oversedation. °· Trouble breathing on your own. You may need to have a breathing tube until you are awake and breathing on your own. °· Allergic reaction to any of the medicines used for the procedure. °BEFORE THE PROCEDURE °· You may have blood tests done. These tests can help show how well your kidneys and liver are working. They can also   show how well your blood clots. °· A physical exam will be done.   °· Only take medicines as directed by your health care provider. You may need to stop taking medicines (such as blood thinners, aspirin, or nonsteroidal anti-inflammatory drugs) before the procedure.   °· Do not eat or drink at least 6 hours before the procedure or as directed by your health care provider. °· Arrange for a responsible adult, family member, or friend to  take you home after the procedure. He or she should stay with you for at least 24 hours after the procedure, until the medicine has worn off. °PROCEDURE  °· An intravenous (IV) catheter will be inserted into one of your veins. Medicine will be able to flow directly into your body through this catheter. You may be given medicine through this tube to help prevent pain and help you relax. °· The medical or dental procedure will be done. °AFTER THE PROCEDURE °· You will stay in a recovery area until the medicine has worn off. Your blood pressure and pulse will be checked.   °·  Depending on the procedure you had, you may be allowed to go home when you can tolerate liquids and your pain is under control. °Document Released: 08/20/2001 Document Revised: 11/30/2013 Document Reviewed: 08/02/2013 °ExitCare® Patient Information ©2015 [employer]. This information is not intended to replace advice given to you by your health care provider. Make sure you discuss any questions you have with your health care provider. ° °

## 2014-11-09 NOTE — H&P (Signed)
Chief Complaint: "I'm here for a biopsy" Referring Physician:Gorsuch HPI: Daniel [name] is an 80 y.o. male with hx of lymphoma. He is found to have extensive adenopathy in his abdomen. He is scheduled today for a biopsy. PMHX, meds, labs, imaging reviewed.   Past Medical History:  Past Medical History  Diagnosis Date  . HOH (hard of hearing)   . Diverticulosis   . BPH (benign prostatic hyperplasia)   . Congenital malformation of spine     T12  . Hx of hydronephrosis   . NHL (non-Hodgkin's lymphoma)     nhl dx 5/09;  . History of kidney stones 2013  . Cough for last 2 years    occasional white sputum  . Sleep apnea   . Prostate enlargement   . Non-Hodgkin lymphoma   . Hydronephrosis 10/27/2014    Past Surgical History:  Past Surgical History  Procedure Laterality Date  . Prostate surgery  yrs ago  . Eye surgery  yrs ago    Implants bilateral  . Inguinal hernia repair  yrs ago    left  . Cholecystectomy  yrs ago  . Esophagogastroduodenoscopy (egd) with propofol N/A 07/15/2013    Procedure: ESOPHAGOGASTRODUODENOSCOPY (EGD) WITH PROPOFOL;  Surgeon: Milus Banister, MD;  Location: [facility];  Service: Endoscopy;  Laterality: N/A;  botox injection  . Botox injection N/A 07/15/2013    Procedure: BOTOX INJECTION;  Surgeon: Milus Banister, MD;  Location: [facility];  Service: Endoscopy;  Laterality: N/A;    Family History:  Family History  Problem Relation Age of Onset  . Tuberculosis Mother   . Blindness Father     Social History:  reports that he quit smoking about 54 years ago. His smoking use included Pipe. He has never used smokeless tobacco. He reports that he drinks about 0.6 oz of alcohol per week. He reports that he does not use illicit drugs.  Allergies: No Known Allergies  Medications:   Medication List    ASK your doctor about these medications        finasteride 5 MG tablet  Commonly known as:  PROSCAR  Take 5 mg by mouth daily with breakfast.      fish oil-omega-3 fatty acids 1000 MG capsule  Take 1 g by mouth daily with breakfast.     furosemide 20 MG tablet  Commonly known as:  LASIX  Take 20 mg by mouth every morning.     GENTEAL OP  Place 1 drop into both eyes 2 (two) times daily as needed. Dry eyes     glucosamine-chondroitin 500-400 MG tablet  Take 1 tablet by mouth daily.     ICAPS MV PO  Take 1 tablet by mouth daily.     lansoprazole 30 MG capsule  Commonly known as:  PREVACID  Take 30 mg by mouth daily.        Please HPI for pertinent positives, otherwise complete 10 system ROS negative.  Physical Exam: BP 125/64 mmHg  Pulse 79  Temp(Src) 97.9 F (36.6 C) (Oral)  Resp 18  Ht 5\' 5"  (1.651 m)  Wt 139 lb 2 oz (63.107 kg)  BMI 23.15 kg/m2  SpO2 98% Body mass index is 23.15 kg/(m^2).   General Appearance:  Alert, cooperative, no distress, appears stated age  Head:  Normocephalic, without obvious abnormality, atraumatic  ENT: Unremarkable  Neck: Supple, symmetrical, trachea midline  Lungs:   Clear to auscultation bilaterally, no w/r/r, respirations unlabored without use of accessory muscles.  Chest Wall:  No tenderness or deformity  Heart:  Regular rate and rhythm, S1, S2 normal, no murmur, rub or gallop.  Abdomen:   Soft, non-tender, non distended.  Neurologic: Normal affect, no gross deficits.  Labs: Results for orders placed or performed during the hospital encounter of 11/09/14 (from the past 48 hour(s))  APTT     Status: None   Collection Time: 11/09/14  7:20 AM  Result Value Ref Range   aPTT 24 24 - 37 seconds  Protime-INR     Status: None   Collection Time: 11/09/14  7:20 AM  Result Value Ref Range   Prothrombin Time 12.6 11.6 - 15.2 seconds   INR 0.94 0.00 - 1.49    Imaging: No results found.  Assessment/Plan Abdominal lymphadenopathy with hx of lymphoma For CT guided biopsy today Explained procedure, risks, complications, use of sedation Labs reviewed. Consent signed in  chart  Ascencion Dike PA-C 11/09/2014, 8:17 AM

## 2014-11-10 ENCOUNTER — Ambulatory Visit: Payer: Medicare Other | Admitting: Hematology and Oncology

## 2014-11-10 ENCOUNTER — Other Ambulatory Visit: Payer: Medicare Other

## 2014-11-14 ENCOUNTER — Encounter: Payer: Self-pay | Admitting: *Deleted

## 2014-11-14 ENCOUNTER — Telehealth: Payer: Self-pay | Admitting: Hematology and Oncology

## 2014-11-14 ENCOUNTER — Other Ambulatory Visit: Payer: Self-pay | Admitting: Hematology and Oncology

## 2014-11-14 ENCOUNTER — Encounter: Payer: Self-pay | Admitting: Hematology and Oncology

## 2014-11-14 ENCOUNTER — Other Ambulatory Visit (HOSPITAL_BASED_OUTPATIENT_CLINIC_OR_DEPARTMENT_OTHER): Payer: Medicare Other

## 2014-11-14 ENCOUNTER — Ambulatory Visit (HOSPITAL_BASED_OUTPATIENT_CLINIC_OR_DEPARTMENT_OTHER): Payer: Medicare Other | Admitting: Hematology and Oncology

## 2014-11-14 VITALS — BP 132/57 | HR 95 | Temp 97.4°F | Resp 18 | Ht 65.0 in | Wt 135.7 lb

## 2014-11-14 DIAGNOSIS — D630 Anemia in neoplastic disease: Secondary | ICD-10-CM

## 2014-11-14 DIAGNOSIS — C8300 Small cell B-cell lymphoma, unspecified site: Secondary | ICD-10-CM

## 2014-11-14 DIAGNOSIS — Z23 Encounter for immunization: Secondary | ICD-10-CM

## 2014-11-14 DIAGNOSIS — Z299 Encounter for prophylactic measures, unspecified: Secondary | ICD-10-CM | POA: Insufficient documentation

## 2014-11-14 LAB — CBC WITH DIFFERENTIAL/PLATELET
BASO%: 0.8 % (ref 0.0–2.0)
Basophils Absolute: 0.1 10*3/uL (ref 0.0–0.1)
EOS ABS: 0.1 10*3/uL (ref 0.0–0.5)
EOS%: 0.7 % (ref 0.0–7.0)
HCT: 38.2 % — ABNORMAL LOW (ref 38.4–49.9)
HGB: 12.3 g/dL — ABNORMAL LOW (ref 13.0–17.1)
LYMPH%: 57.3 % — AB (ref 14.0–49.0)
MCH: 30.2 pg (ref 27.2–33.4)
MCHC: 32.1 g/dL (ref 32.0–36.0)
MCV: 94.1 fL (ref 79.3–98.0)
MONO#: 1.2 10*3/uL — ABNORMAL HIGH (ref 0.1–0.9)
MONO%: 7.7 % (ref 0.0–14.0)
NEUT#: 5.2 10*3/uL (ref 1.5–6.5)
NEUT%: 33.5 % — ABNORMAL LOW (ref 39.0–75.0)
PLATELETS: 176 10*3/uL (ref 140–400)
RBC: 4.07 10*6/uL — ABNORMAL LOW (ref 4.20–5.82)
RDW: 16.3 % — ABNORMAL HIGH (ref 11.0–14.6)
WBC: 15.4 10*3/uL — ABNORMAL HIGH (ref 4.0–10.3)
lymph#: 8.8 10*3/uL — ABNORMAL HIGH (ref 0.9–3.3)

## 2014-11-14 LAB — COMPREHENSIVE METABOLIC PANEL (CC13)
ALK PHOS: 216 U/L — AB (ref 40–150)
ALT: 25 U/L (ref 0–55)
AST: 32 U/L (ref 5–34)
Albumin: 3.1 g/dL — ABNORMAL LOW (ref 3.5–5.0)
Anion Gap: 11 mEq/L (ref 3–11)
BILIRUBIN TOTAL: 0.62 mg/dL (ref 0.20–1.20)
BUN: 29.5 mg/dL — ABNORMAL HIGH (ref 7.0–26.0)
CO2: 26 mEq/L (ref 22–29)
Calcium: 9.8 mg/dL (ref 8.4–10.4)
Chloride: 102 mEq/L (ref 98–109)
Creatinine: 1.2 mg/dL (ref 0.7–1.3)
EGFR: 52 mL/min/{1.73_m2} — AB (ref >90)
GLUCOSE: 107 mg/dL (ref 70–140)
Potassium: 4.4 mEq/L (ref 3.5–5.1)
Sodium: 139 mEq/L (ref 136–145)
TOTAL PROTEIN: 5.9 g/dL — AB (ref 6.4–8.3)

## 2014-11-14 LAB — TECHNOLOGIST REVIEW

## 2014-11-14 LAB — LACTATE DEHYDROGENASE (CC13): LDH: 505 U/L — AB (ref 125–245)

## 2014-11-14 MED ORDER — IBRUTINIB 140 MG PO CAPS: 420.0000 mg | ORAL_CAPSULE | Freq: Every day | ORAL | Status: DC

## 2014-11-14 MED ORDER — INFLUENZA VAC SPLIT QUAD 0.5 ML IM SUSY
0.5000 mL | PREFILLED_SYRINGE | Freq: Once | INTRAMUSCULAR | Status: AC
  Administered 2014-11-14: 0.5 mL via INTRAMUSCULAR
  Filled 2014-11-14: qty 0.5

## 2014-11-14 NOTE — Assessment & Plan Note (Signed)
I plan to review his case next week at the hematology tumor board. According to current guidelines, the next course of action would be Ibruitinib I discussed with the patient the risks, benefit, side effects of Ibrutinib that and he agreed to proceed. I will start him on 420 mg daily and plan to see him back at the end of next week for toxicity review.

## 2014-11-14 NOTE — Assessment & Plan Note (Signed)
This is likely anemia of chronic disease and the underlying bone marrow disease. The patient denies recent history of bleeding such as epistaxis, hematuria or hematochezia. He is asymptomatic from the anemia. We will observe for now.  He does not require transfusion now.  

## 2014-11-14 NOTE — Patient Instructions (Signed)
Ibrutinib capsules  What is this medicine?  IBRUTINIB (eye BROO ti nib) is a chemotherapy drug. It targets proteins in cancer cells and stops the cancer cells from growing. This medicine is used to treat mantle cell lymphoma, chronic lymphocytic leukemia, and Waldenstrom macroglobulinemia.  This medicine may be used for other purposes; ask your health care provider or pharmacist if you have questions.  COMMON BRAND NAME(S): IMBRUVICA  What should I tell my health care provider before I take this medicine?  They need to know if you have any of these conditions:  -bleeding disorders  -heart disease  -history of irregular heartbeat  -infection  -liver disease  -recent surgery  -smoke tobacco  -take medicines that treat or prevent blood clots  -an unusual or allergic reaction to ibrutinib, other medicines, foods, dyes, or preservatives  -pregnant or trying to get pregnant  -breast-feeding  How should I use this medicine?  Take this medicine by mouth with a glass of water. Follow the directions on the prescription label. Do not cut, crush or chew this medicine. Take your medicine at regular intervals. Do not take it more often than directed. Do not stop taking except on your doctor's advice.  Talk to your pediatrician regarding the use of this medicine in children. Special care may be needed.  Overdosage: If you think you've taken too much of this medicine contact a poison control center or emergency room at once.  Overdosage: If you think you have taken too much of this medicine contact a poison control center or emergency room at once.  NOTE: This medicine is only for you. Do not share this medicine with others.  What if I miss a dose?  If you miss a dose, take it as soon as you can. If it is almost time for your next dose, take only that dose. Do not take double or extra doses.  What may interact with this medicine?  Do not take this medicine with any of the following  medications:  -boceprevir  -bosentan  -carbamazepine  -certain medicines for fungal infections like ketoconazole, itraconazole, posaconazole, and voriconazole  -chloramphenicol  -clarithromycin  -conivaptan  -delavirdine  -efavirenz  -enzalutamide  -grapefruit juice  -indinavir  -isoniazid  -lanreotide or octreotide  -nefazodone  -nelfinavir  -nevirapine  -nicardipine  -phenobarbital  -phenytoin  -rifampin  -ritonavir  -saquinavir  -[place] oranges  -st. john's wort  -telaprevir  -telithromycin  -tipranavir  This medicine may also interact with the following medications:  -amiodarone  -amitriptyline  -amprenavir or fosamprenavir  -aprepitant or fosaprepitant  -atazanavir  -bromocriptine  -ciprofloxacin  -crizotinib  -danazol  -darunavir  -dasatinib  -digoxin  -diltiazem  -erythromycin  -fluconazole  -fluvoxamine  -imatinib  -lapatinib  -mifepristone, RU-486  -quinine  -verapamil  -zafirlukast  This list may not describe all possible interactions. Give your health care provider a list of all the medicines, herbs, non-prescription drugs, or dietary supplements you use. Also tell them if you smoke, drink alcohol, or use illegal drugs. Some items may interact with your medicine.  What should I watch for while using this medicine?  This drug may make you feel generally unwell. This is not uncommon, as chemotherapy can affect healthy cells as well as cancer cells. Report any side effects. Continue your course of treatment even though you feel ill unless your doctor tells you to stop.  Do not become pregnant while taking this medicine. Women should inform their doctor if they wish to become pregnant or think they   might be pregnant. There is a potential for serious side effects to an unborn child. Talk to your health care professional or pharmacist for more information. Do not breast-feed an infant while taking this medicine.  This medicine may increase your risk to bruise or bleed. Call your doctor or health care  professional if you notice any unusual bleeding.  If you are going to have surgery or any other procedures, tell your doctor you are taking this medicine.  Call your doctor or health care professional for advice if you get a fever, chills or sore throat, or other symptoms of a cold or flu. Do not treat yourself. This drug decreases your body's ability to fight infections. Try to avoid being around people who are sick.  You may need blood work done while you are taking this medicine.  Talk to your doctor about your risk of cancer. You may be more at risk for certain types of cancers if you take this medicine.  What side effects may I notice from receiving this medicine?  Side effects that you should report to your doctor or health care professional as soon as possible:  -  allergic reactions like skin rash, itching or hives, swelling of the face, lips, or tongue  -confusion  -low blood counts - this medicine may decrease the number of white blood cells, red blood cells and platelets. You may be at increased risk for infections and bleeding  -signs or symptoms of bleeding such as bloody or black, tarry stools; red or dark-brown urine; spitting up blood or brown material that looks like coffee grounds; red spots on the skin; unusual bruising or bleeding from the eye, gums, or nose  -signs and symptoms of a dangerous change in heartbeat or heart rhythm like chest pain; dizziness; fast or irregular heartbeat; palpitations; feeling faint or lightheaded, falls; breathing problems  -signs and symptoms of infection like fever or chills; cough; sore throat; or pain when urinating  -signs and symptoms of kidney injury like trouble passing urine or change in the amount of urine  -unusually weak or tired  Side effects that usually do not require medical attention (report to your doctor or health care professional if they continue or are bothersome):  -bone pain  -diarrhea  -joint pain  -muscle pain  -nausea  This list may not  describe all possible side effects. Call your doctor for medical advice about side effects. You may report side effects to FDA at 1-800-FDA-1088.  Where should I keep my medicine?  Keep out of the reach of children.  Store between 20 and 25 degrees C (68 and 77 degrees F). Keep this medicine in the original container. Throw away any unused medicine after the expiration date.  NOTE: This sheet is a summary. It may not cover all possible information. If you have questions about this medicine, talk to your doctor, pharmacist, or health care provider.   2015, [employer]vier/Gold Standard. (2014-01-10 20:55:55)

## 2014-11-14 NOTE — Progress Notes (Signed)
[name] OFFICE PROGRESS NOTE  Patient Care Team: [name], MD as PCP - General (Internal Medicine)  SUMMARY OF ONCOLOGIC HISTORY:  Daniel [name] 80 y.o. male was transferred to my care after his prior physician has left.  I reviewed the patient's records extensive and collaborated the history with the patient. Summary of his history is as follows: This patient was diagnosed with SLL in 2009 after presentation with an abdominal mass. Core needle biopsy on 04/21/2008 showed coexpression of CD5 and CD20, also CD19, CD21, and CD22. CD10 was negative. The patient had involvement of the  peripheral blood and bone marrow when he was evaluated at [facility] in early June 2009. The patient initially was treated with Rituxan and fludarabine, 4  cycles from 05/31/2008 through 08/22/2008. The patient had an excellent response to his induction treatment with Rituxan and fludarabine.  Beginning in the fall of 2009, the patient was maintained on Rituxan every 3 months.  However, a CT scan of the abdomen and pelvis carried out without IV contrast on 05/12/2012 showed mild progression of mesenteric adenopathy when compared with the CT scan of  the abdomen and pelvis carried out on 04/15/2011.  On 05/19/2012, the patient began receiving Rituxan on a monthly basis.  CT scan of the abdomen and pelvis carried out on 11/13/2012 showed no significant changes, possibly even some improvement.  Rituxan is now being given every 2 months as of 12/17/2012.  Repeat imaging study on 10/24/2014 show progression of disease On 11/09/2014, repeat CT-guided biopsy of the abdominal mass is positive for SLL INTERVAL HISTORY: Please see below for problem oriented charting. He is doing well. Denies recent infection.  REVIEW OF SYSTEMS:  All other systems were reviewed with the patient and are negative.  I have reviewed the past medical history, past surgical history,  social history and family history with the patient and they are unchanged from previous note.  ALLERGIES:  has No Known Allergies.  MEDICATIONS:  Current Outpatient Prescriptions  Medication Sig Dispense Refill  . finasteride (PROSCAR) 5 MG tablet Take 5 mg by mouth daily with breakfast.     . fish oil-omega-3 fatty acids 1000 MG capsule Take 1 g by mouth daily with breakfast.     . furosemide (LASIX) 20 MG tablet Take 20 mg by mouth every morning.     Marland Kitchen glucosamine-chondroitin 500-400 MG tablet Take 1 tablet by mouth daily.     . Hypromellose (GENTEAL OP) Place 1 drop into both eyes 2 (two) times daily as needed. Dry eyes    . lansoprazole (PREVACID) 30 MG capsule Take 30 mg by mouth daily.      . Multiple Vitamins-Minerals (ICAPS MV PO) Take 1 tablet by mouth daily.    Marland Kitchen ibrutinib (IMBRUVICA) 140 MG capsul Take 3 capsules (420 mg total) by mouth daily. 90 capsule 0   No current facility-administered medications for this visit.    PHYSICAL EXAMINATION: ECOG PERFORMANCE STATUS: 1 - Symptomatic but completely ambulatory  Filed Vitals:   11/14/14 1415  BP: 132/57  Pulse: 95  Temp: 97.4 F (36.3 C)  Resp: 18   Filed Weights   11/14/14 1415  Weight: 135 lb 11.2 oz (61.553 kg)    GENERAL:alert, no distress and comfortable SKIN: skin color, texture, turgor are normal, no rashes or significant lesions EYES: normal, Conjunctiva are pink and non-injected, sclera clear Musculoskeletal:no cyanosis of digits and no clubbing  NEURO: alert & oriented x 3 with  fluent speech, no focal motor/sensory deficits  LABORATORY DATA:  I have reviewed the data as listed    Component Value Date/Time   NA 139 11/14/2014 1512   NA 139 05/27/2013 1338   NA 142 05/12/2012 0739   K 4.4 11/14/2014 1512   K 4.6 05/27/2013 1338   K 4.5 05/12/2012 0739   CL 104 05/27/2013 1338   CL 103 04/19/2013 0828   CL 100 05/12/2012 0739   CO2 26 11/14/2014 1512   CO2 30 05/27/2013 1338   CO2 30 05/12/2012  0739   GLUCOSE 107 11/14/2014 1512   GLUCOSE 96 05/27/2013 1338   GLUCOSE 101* 04/19/2013 0828   GLUCOSE 122* 05/12/2012 0739   BUN 29.5* 11/14/2014 1512   BUN 19 05/27/2013 1338   BUN 24* 05/12/2012 0739   CREATININE 1.2 11/14/2014 1512   CREATININE 1.13 05/27/2013 1338   CREATININE 1.6* 05/12/2012 0739   CALCIUM 9.8 11/14/2014 1512   CALCIUM 9.4 05/27/2013 1338   CALCIUM 8.7 05/12/2012 0739   PROT 5.9* 11/14/2014 1512   PROT 5.7* 05/27/2013 1338   PROT 6.3* 05/12/2012 0739   ALBUMIN 3.1* 11/14/2014 1512   ALBUMIN 3.2* 05/27/2013 1338   AST 32 11/14/2014 1512   AST 195* 05/27/2013 1338   AST 22 05/12/2012 0739   ALT 25 11/14/2014 1512   ALT 174* 05/27/2013 1338   ALT 27 05/12/2012 0739   ALKPHOS 216* 11/14/2014 1512   ALKPHOS 151* 05/27/2013 1338   ALKPHOS 109* 05/12/2012 0739   BILITOT 0.62 11/14/2014 1512   BILITOT 0.6 05/27/2013 1338   BILITOT 0.80 05/12/2012 0739   GFRNONAA 55* 05/27/2013 1338   GFRAA 64* 05/27/2013 1338    No results found for: SPEP, UPEP  Lab Results  Component Value Date   WBC 15.4* 11/14/2014   NEUTROABS 5.2 11/14/2014   HGB 12.3* 11/14/2014   HCT 38.2* 11/14/2014   MCV 94.1 11/14/2014   PLT 176 11/14/2014      Chemistry      Component Value Date/Time   NA 139 11/14/2014 1512   NA 139 05/27/2013 1338   NA 142 05/12/2012 0739   K 4.4 11/14/2014 1512   K 4.6 05/27/2013 1338   K 4.5 05/12/2012 0739   CL 104 05/27/2013 1338   CL 103 04/19/2013 0828   CL 100 05/12/2012 0739   CO2 26 11/14/2014 1512   CO2 30 05/27/2013 1338   CO2 30 05/12/2012 0739   BUN 29.5* 11/14/2014 1512   BUN 19 05/27/2013 1338   BUN 24* 05/12/2012 0739   CREATININE 1.2 11/14/2014 1512   CREATININE 1.13 05/27/2013 1338   CREATININE 1.6* 05/12/2012 0739      Component Value Date/Time   CALCIUM 9.8 11/14/2014 1512   CALCIUM 9.4 05/27/2013 1338   CALCIUM 8.7 05/12/2012 0739   ALKPHOS 216* 11/14/2014 1512   ALKPHOS 151* 05/27/2013 1338   ALKPHOS 109*  05/12/2012 0739   AST 32 11/14/2014 1512   AST 195* 05/27/2013 1338   AST 22 05/12/2012 0739   ALT 25 11/14/2014 1512   ALT 174* 05/27/2013 1338   ALT 27 05/12/2012 0739   BILITOT 0.62 11/14/2014 1512   BILITOT 0.6 05/27/2013 1338   BILITOT 0.80 05/12/2012 0739     I reviewed the surgical report.  ASSESSMENT & PLAN:  Lymphoma, small lymphocytic I plan to review his case next week at the hematology tumor board. According to current guidelines, the next course of action would be Ibruitinib I discussed with  the patient the risks, benefit, side effects of Ibrutinib that and he agreed to proceed. I will start him on 420 mg daily and plan to see him back at the end of next week for toxicity review.  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 The patient denies recent history of bleeding such as epistaxis, hematuria or hematochezia. He is asymptomatic from the anemia. We will observe for now.  He does not require transfusion now.    Preventive measure We discussed the importance of preventive care and reviewed the vaccination programs. He does not have any prior allergic reactions to influenza vaccination. He agrees to proceed with influenza vaccination today and we will administer it today at the clinic.    No orders of the defined types were placed in this encounter.   All questions were answered. The patient knows to call the clinic with any problems, questions or concerns. No barriers to learning was detected. I spent 30 minutes counseling the patient face to face. The total time spent in the appointment was 40 minutes and more than 50% was on counseling and review of test results     [facility], [place], MD 11/14/2014 7:19 PM

## 2014-11-14 NOTE — Progress Notes (Signed)
Rx for ibrutinib faxed to [facility] outpatient pharmacy.

## 2014-11-14 NOTE — Assessment & Plan Note (Signed)
We discussed the importance of preventive care and reviewed the vaccination programs. He does not have any prior allergic reactions to influenza vaccination. He agrees to proceed with influenza vaccination today and we will administer it today at the clinic.  

## 2014-11-14 NOTE — Telephone Encounter (Signed)
Gave avs & cal for Dec. °

## 2014-11-15 ENCOUNTER — Encounter: Payer: Self-pay | Admitting: Hematology and Oncology

## 2014-11-15 NOTE — Progress Notes (Signed)
Mr. Falconi doesn't have any prescription coverage, called him about imbruvica patient assistance and he said he will bring me his social Occupational hygienist.

## 2014-11-17 ENCOUNTER — Encounter: Payer: Self-pay | Admitting: Oncology

## 2014-11-17 NOTE — Progress Notes (Signed)
Faxed imbruvica patient assistance form to [employer]

## 2014-11-18 ENCOUNTER — Telehealth: Payer: Self-pay | Admitting: *Deleted

## 2014-11-18 NOTE — Telephone Encounter (Signed)
-----   Message from Heath Lark, MD sent at 11/18/2014 12:38 PM EST ----- Can you check to see if he has received his pills?  ----- Message -----    From: Heath Lark, MD    Sent: 11/14/2014   2:33 PM      To: Heath Lark, MD Subject: check pill

## 2014-11-18 NOTE — Telephone Encounter (Signed)
Pt states he has not received pills

## 2014-11-22 ENCOUNTER — Telehealth: Payer: Self-pay | Admitting: *Deleted

## 2014-11-22 NOTE — Telephone Encounter (Signed)
Pt states he has not heard anything about getting his Ibrutinib yet.  I called [facility] outpt pharmacy and s/w Aaron Edelman.  He says pt has no Medication Plan at a all and Medication costs more than $7,000 per month even w/ the discount.   Patient assistance forms have been sent to [facility] and [place] to apply for assistance.

## 2014-11-23 ENCOUNTER — Encounter: Payer: Self-pay | Admitting: Hematology and Oncology

## 2014-11-23 NOTE — Progress Notes (Signed)
[place] about imbruvica application; patient is approved from 11/23/14-03/25/15; he has to apply for [place] through the social security administration.  If he is denied lis then [facility] will continue to provide medication.

## 2014-11-24 ENCOUNTER — Other Ambulatory Visit: Payer: Self-pay | Admitting: Hematology and Oncology

## 2014-11-24 DIAGNOSIS — C8300 Small cell B-cell lymphoma, unspecified site: Secondary | ICD-10-CM

## 2014-11-25 ENCOUNTER — Other Ambulatory Visit (HOSPITAL_BASED_OUTPATIENT_CLINIC_OR_DEPARTMENT_OTHER): Payer: Medicare Other

## 2014-11-25 ENCOUNTER — Telehealth: Payer: Self-pay | Admitting: Hematology and Oncology

## 2014-11-25 ENCOUNTER — Ambulatory Visit (HOSPITAL_BASED_OUTPATIENT_CLINIC_OR_DEPARTMENT_OTHER): Payer: Medicare Other | Admitting: Hematology and Oncology

## 2014-11-25 VITALS — BP 124/57 | HR 79 | Temp 97.4°F | Resp 18 | Ht 65.0 in | Wt 137.8 lb

## 2014-11-25 DIAGNOSIS — N1339 Other hydronephrosis: Secondary | ICD-10-CM

## 2014-11-25 DIAGNOSIS — D630 Anemia in neoplastic disease: Secondary | ICD-10-CM

## 2014-11-25 DIAGNOSIS — C8300 Small cell B-cell lymphoma, unspecified site: Secondary | ICD-10-CM

## 2014-11-25 LAB — COMPREHENSIVE METABOLIC PANEL (CC13)
ALBUMIN: 3.2 g/dL — AB (ref 3.5–5.0)
ALT: 20 U/L (ref 0–55)
ANION GAP: 9 meq/L (ref 3–11)
AST: 25 U/L (ref 5–34)
Alkaline Phosphatase: 142 U/L (ref 40–150)
BUN: 26.4 mg/dL — ABNORMAL HIGH (ref 7.0–26.0)
CALCIUM: 9.2 mg/dL (ref 8.4–10.4)
CO2: 27 meq/L (ref 22–29)
Chloride: 105 mEq/L (ref 98–109)
Creatinine: 1.2 mg/dL (ref 0.7–1.3)
EGFR: 53 mL/min/{1.73_m2} — AB (ref >90)
GLUCOSE: 118 mg/dL (ref 70–140)
POTASSIUM: 4.5 meq/L (ref 3.5–5.1)
Sodium: 141 mEq/L (ref 136–145)
TOTAL PROTEIN: 5.5 g/dL — AB (ref 6.4–8.3)
Total Bilirubin: 0.42 mg/dL (ref 0.20–1.20)

## 2014-11-25 LAB — CBC WITH DIFFERENTIAL/PLATELET
BASO%: 0.7 % (ref 0.0–2.0)
Basophils Absolute: 0.1 10*3/uL (ref 0.0–0.1)
EOS ABS: 0.1 10*3/uL (ref 0.0–0.5)
EOS%: 0.6 % (ref 0.0–7.0)
HCT: 36.7 % — ABNORMAL LOW (ref 38.4–49.9)
HEMOGLOBIN: 11.6 g/dL — AB (ref 13.0–17.1)
LYMPH%: 59.7 % — ABNORMAL HIGH (ref 14.0–49.0)
MCH: 29.9 pg (ref 27.2–33.4)
MCHC: 31.7 g/dL — ABNORMAL LOW (ref 32.0–36.0)
MCV: 94.4 fL (ref 79.3–98.0)
MONO#: 1.8 10*3/uL — AB (ref 0.1–0.9)
MONO%: 10.2 % (ref 0.0–14.0)
NEUT%: 28.8 % — ABNORMAL LOW (ref 39.0–75.0)
NEUTROS ABS: 5.1 10*3/uL (ref 1.5–6.5)
PLATELETS: 223 10*3/uL (ref 140–400)
RBC: 3.89 10*6/uL — ABNORMAL LOW (ref 4.20–5.82)
RDW: 16 % — AB (ref 11.0–14.6)
WBC: 17.7 10*3/uL — AB (ref 4.0–10.3)
lymph#: 10.6 10*3/uL — ABNORMAL HIGH (ref 0.9–3.3)

## 2014-11-25 LAB — LACTATE DEHYDROGENASE (CC13): LDH: 278 U/L — ABNORMAL HIGH (ref 125–245)

## 2014-11-25 LAB — TECHNOLOGIST REVIEW

## 2014-11-25 LAB — URIC ACID (CC13): Uric Acid, Serum: 6.2 mg/dl (ref 2.6–7.4)

## 2014-11-25 NOTE — Progress Notes (Signed)
[place] OFFICE PROGRESS NOTE  Patient Care Team: [name], MD as PCP - General (Internal Medicine)  SUMMARY OF ONCOLOGIC HISTORY:   Lymphoma, small lymphocytic   11/10/2011 Initial Diagnosis Lymphoma, small lymphocytic   11/09/2014 Pathology Results Ct guided biopsy of abdominal mass was positive for SLL without signs of transformation    Daniel [name] 80 y.o. male was transferred to my care after his prior physician has left.  I reviewed the patient's records extensive and collaborated the history with the patient. Summary of his history is as follows: This patient was diagnosed with SLL in 2009 after presentation with an abdominal mass. Core needle biopsy on 04/21/2008 showed coexpression of CD5 and CD20, also CD19, CD21, and CD22. CD10 was negative. The patient had involvement of the  peripheral blood and bone marrow when he was evaluated at [facility] in early June 2009. The patient initially was treated with Rituxan and fludarabine, 4  cycles from 05/31/2008 through 08/22/2008. The patient had an excellent response to his induction treatment with Rituxan and fludarabine.  Beginning in the fall of 2009, the patient was maintained on Rituxan every 3 months.  However, a CT scan of the abdomen and pelvis carried out without IV contrast on 05/12/2012 showed mild progression of mesenteric adenopathy when compared with the CT scan of  the abdomen and pelvis carried out on 04/15/2011.  On 05/19/2012, the patient began receiving Rituxan on a monthly basis.  CT scan of the abdomen and pelvis carried out on 11/13/2012 showed no significant changes, possibly even some improvement.  Rituxan is now being given every 2 months as of 12/17/2012.  Repeat imaging study on 10/24/2014 show progression of disease On 11/09/2014, repeat CT-guided biopsy of the abdominal mass is positive for SLL INTERVAL HISTORY: Please see below for problem oriented  charting. The patient feels fine. Denies any pain. No new lymphadenopathy or weight loss.  REVIEW OF SYSTEMS:   Constitutional: Denies fevers, chills or abnormal weight loss Eyes: Denies blurriness of vision Ears, nose, mouth, throat, and face: Denies mucositis or sore throat Respiratory: Denies cough, dyspnea or wheezes Cardiovascular: Denies palpitation, chest discomfort or lower extremity swelling Gastrointestinal:  Denies nausea, heartburn or change in bowel habits Skin: Denies abnormal skin rashes Lymphatics: Denies new lymphadenopathy or easy bruising Neurological:Denies numbness, tingling or new weaknesses Behavioral/Psych: Mood is stable, no new changes  All other systems were reviewed with the patient and are negative.  I have reviewed the past medical history, past surgical history, social history and family history with the patient and they are unchanged from previous note.  ALLERGIES:  has No Known Allergies.  MEDICATIONS:  Current Outpatient Prescriptions  Medication Sig Dispense Refill  . finasteride (PROSCAR) 5 MG tablet Take 5 mg by mouth daily with breakfast.     . fish oil-omega-3 fatty acids 1000 MG capsule Take 1 g by mouth daily with breakfast.     . furosemide (LASIX) 20 MG tablet Take 20 mg by mouth every morning.     Marland Kitchen glucosamine-chondroitin 500-400 MG tablet Take 1 tablet by mouth daily.     . Hypromellose (GENTEAL OP) Place 1 drop into both eyes 2 (two) times daily as needed. Dry eyes    . ibrutinib (IMBRUVICA) 140 MG capsul Take 3 capsules (420 mg total) by mouth daily. 90 capsule 0  . lansoprazole (PREVACID) 30 MG capsule Take 30 mg by mouth daily.      . Multiple Vitamins-Minerals (  ICAPS MV PO) Take 1 tablet by mouth daily.     No current facility-administered medications for this visit.    PHYSICAL EXAMINATION: ECOG PERFORMANCE STATUS: 0 - Asymptomatic  Filed Vitals:   11/25/14 1359  BP: 124/57  Pulse: 79  Temp: 97.4 F (36.3 C)  Resp: 18    Filed Weights   11/25/14 1359  Weight: 137 lb 12.8 oz (62.506 kg)    GENERAL:alert, no distress and comfortable SKIN: skin color, texture, turgor are normal, no rashes or significant lesions EYES: normal, Conjunctiva are pink and non-injected, sclera clear Finally Musculoskeletal:no cyanosis of digits and no clubbing  NEURO: alert & oriented x 3 with fluent speech, no focal motor/sensory deficits  LABORATORY DATA:  I have reviewed the data as listed    Component Value Date/Time   NA 139 11/14/2014 1512   NA 139 05/27/2013 1338   NA 142 05/12/2012 0739   K 4.4 11/14/2014 1512   K 4.6 05/27/2013 1338   K 4.5 05/12/2012 0739   CL 104 05/27/2013 1338   CL 103 04/19/2013 0828   CL 100 05/12/2012 0739   CO2 26 11/14/2014 1512   CO2 30 05/27/2013 1338   CO2 30 05/12/2012 0739   GLUCOSE 107 11/14/2014 1512   GLUCOSE 96 05/27/2013 1338   GLUCOSE 101* 04/19/2013 0828   GLUCOSE 122* 05/12/2012 0739   BUN 29.5* 11/14/2014 1512   BUN 19 05/27/2013 1338   BUN 24* 05/12/2012 0739   CREATININE 1.2 11/14/2014 1512   CREATININE 1.13 05/27/2013 1338   CREATININE 1.6* 05/12/2012 0739   CALCIUM 9.8 11/14/2014 1512   CALCIUM 9.4 05/27/2013 1338   CALCIUM 8.7 05/12/2012 0739   PROT 5.9* 11/14/2014 1512   PROT 5.7* 05/27/2013 1338   PROT 6.3* 05/12/2012 0739   ALBUMIN 3.1* 11/14/2014 1512   ALBUMIN 3.2* 05/27/2013 1338   AST 32 11/14/2014 1512   AST 195* 05/27/2013 1338   AST 22 05/12/2012 0739   ALT 25 11/14/2014 1512   ALT 174* 05/27/2013 1338   ALT 27 05/12/2012 0739   ALKPHOS 216* 11/14/2014 1512   ALKPHOS 151* 05/27/2013 1338   ALKPHOS 109* 05/12/2012 0739   BILITOT 0.62 11/14/2014 1512   BILITOT 0.6 05/27/2013 1338   BILITOT 0.80 05/12/2012 0739   GFRNONAA 55* 05/27/2013 1338   GFRAA 64* 05/27/2013 1338    No results found for: SPEP, UPEP  Lab Results  Component Value Date   WBC 17.7* 11/25/2014   NEUTROABS 5.1 11/25/2014   HGB 11.6* 11/25/2014   HCT 36.7*  11/25/2014   MCV 94.4 11/25/2014   PLT 223 11/25/2014      Chemistry      Component Value Date/Time   NA 139 11/14/2014 1512   NA 139 05/27/2013 1338   NA 142 05/12/2012 0739   K 4.4 11/14/2014 1512   K 4.6 05/27/2013 1338   K 4.5 05/12/2012 0739   CL 104 05/27/2013 1338   CL 103 04/19/2013 0828   CL 100 05/12/2012 0739   CO2 26 11/14/2014 1512   CO2 30 05/27/2013 1338   CO2 30 05/12/2012 0739   BUN 29.5* 11/14/2014 1512   BUN 19 05/27/2013 1338   BUN 24* 05/12/2012 0739   CREATININE 1.2 11/14/2014 1512   CREATININE 1.13 05/27/2013 1338   CREATININE 1.6* 05/12/2012 0739      Component Value Date/Time   CALCIUM 9.8 11/14/2014 1512   CALCIUM 9.4 05/27/2013 1338   CALCIUM 8.7 05/12/2012 0739   ALKPHOS  216* 11/14/2014 1512   ALKPHOS 151* 05/27/2013 1338   ALKPHOS 109* 05/12/2012 0739   AST 32 11/14/2014 1512   AST 195* 05/27/2013 1338   AST 22 05/12/2012 0739   ALT 25 11/14/2014 1512   ALT 174* 05/27/2013 1338   ALT 27 05/12/2012 0739   BILITOT 0.62 11/14/2014 1512   BILITOT 0.6 05/27/2013 1338   BILITOT 0.80 05/12/2012 0739      ASSESSMENT & PLAN:  Lymphoma, small lymphocytic Unfortunately, it took a while to get his medications approved. He will get his first dose chemotherapy to start on 11/28/2014. I warned him about expected side effects. I plan to see him back in the first week of the new year with his repeat history, physical examination and blood work to assess for toxicity.  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 The patient denies recent history of bleeding such as epistaxis, hematuria or hematochezia. He is asymptomatic from the anemia. We will observe for now.  He does not require transfusion now.   Hydronephrosis He has significant hydronephrosis I am concerned that it will compromise his kidney function. I recommend urology consultation and the patient was told he does not need any  kind of intervention right now.   Orders Placed This Encounter  Procedures  . CBC with Differential    Standing Status: Standing     Number of Occurrences: 9     Standing Expiration Date: 11/26/2015  . Comprehensive metabolic panel    Standing Status: Standing     Number of Occurrences: 9     Standing Expiration Date: 11/26/2015  . Lactate dehydrogenase    Standing Status: Standing     Number of Occurrences: 2     Standing Expiration Date: 11/26/2015  . Uric Acid    Standing Status: Future     Number of Occurrences:      Standing Expiration Date: 12/30/2015   All questions were answered. The patient knows to call the clinic with any problems, questions or concerns. No barriers to learning was detected. I spent 15 minutes counseling the patient face to face. The total time spent in the appointment was 20 minutes and more than 50% was on counseling and review of test results     [facility], [place], MD 11/25/2014 2:37 PM

## 2014-11-25 NOTE — Assessment & Plan Note (Signed)
Unfortunately, it took a while to get his medications approved. He will get his first dose chemotherapy to start on 11/28/2014. I warned him about expected side effects. I plan to see him back in the first week of the new year with his repeat history, physical examination and blood work to assess for toxicity.

## 2014-11-25 NOTE — Assessment & Plan Note (Signed)
This is likely anemia of chronic disease and the underlying bone marrow disease. The patient denies recent history of bleeding such as epistaxis, hematuria or hematochezia. He is asymptomatic from the anemia. We will observe for now.  He does not require transfusion now.

## 2014-11-25 NOTE — Telephone Encounter (Signed)
Gave avs & cal for Jan. °

## 2014-11-25 NOTE — Assessment & Plan Note (Signed)
He has significant hydronephrosis I am concerned that it will compromise his kidney function. I recommend urology consultation and the patient was told he does not need any kind of intervention right now.

## 2014-12-13 ENCOUNTER — Encounter: Payer: Self-pay | Admitting: Hematology and Oncology

## 2014-12-13 ENCOUNTER — Telehealth: Payer: Self-pay | Admitting: Hematology and Oncology

## 2014-12-13 ENCOUNTER — Ambulatory Visit (HOSPITAL_BASED_OUTPATIENT_CLINIC_OR_DEPARTMENT_OTHER): Payer: Medicare Other | Admitting: Hematology and Oncology

## 2014-12-13 ENCOUNTER — Other Ambulatory Visit (HOSPITAL_BASED_OUTPATIENT_CLINIC_OR_DEPARTMENT_OTHER): Payer: Medicare Other

## 2014-12-13 VITALS — BP 134/58 | HR 78 | Temp 97.4°F | Resp 18 | Ht 65.0 in | Wt 136.7 lb

## 2014-12-13 DIAGNOSIS — C8300 Small cell B-cell lymphoma, unspecified site: Secondary | ICD-10-CM

## 2014-12-13 DIAGNOSIS — D630 Anemia in neoplastic disease: Secondary | ICD-10-CM

## 2014-12-13 LAB — COMPREHENSIVE METABOLIC PANEL (CC13)
ALBUMIN: 3.4 g/dL — AB (ref 3.5–5.0)
ALT: 13 U/L (ref 0–55)
AST: 17 U/L (ref 5–34)
Alkaline Phosphatase: 101 U/L (ref 40–150)
Anion Gap: 9 mEq/L (ref 3–11)
BILIRUBIN TOTAL: 0.91 mg/dL (ref 0.20–1.20)
BUN: 24 mg/dL (ref 7.0–26.0)
CALCIUM: 9.1 mg/dL (ref 8.4–10.4)
CO2: 29 mEq/L (ref 22–29)
Chloride: 103 mEq/L (ref 98–109)
Creatinine: 1 mg/dL (ref 0.7–1.3)
EGFR: 65 mL/min/{1.73_m2} — ABNORMAL LOW (ref >90)
Glucose: 85 mg/dl (ref 70–140)
Potassium: 4 mEq/L (ref 3.5–5.1)
SODIUM: 142 meq/L (ref 136–145)
TOTAL PROTEIN: 5.8 g/dL — AB (ref 6.4–8.3)

## 2014-12-13 LAB — LACTATE DEHYDROGENASE (CC13): LDH: 144 U/L (ref 125–245)

## 2014-12-13 LAB — CBC WITH DIFFERENTIAL/PLATELET
BASO%: 0.8 % (ref 0.0–2.0)
Basophils Absolute: 0 10*3/uL (ref 0.0–0.1)
EOS ABS: 0 10*3/uL (ref 0.0–0.5)
EOS%: 0.6 % (ref 0.0–7.0)
HCT: 37.1 % — ABNORMAL LOW (ref 38.4–49.9)
HGB: 12 g/dL — ABNORMAL LOW (ref 13.0–17.1)
LYMPH#: 2.3 10*3/uL (ref 0.9–3.3)
LYMPH%: 42.7 % (ref 14.0–49.0)
MCH: 31.4 pg (ref 27.2–33.4)
MCHC: 32.4 g/dL (ref 32.0–36.0)
MCV: 97.2 fL (ref 79.3–98.0)
MONO#: 0.4 10*3/uL (ref 0.1–0.9)
MONO%: 7.2 % (ref 0.0–14.0)
NEUT#: 2.6 10*3/uL (ref 1.5–6.5)
NEUT%: 48.7 % (ref 39.0–75.0)
Platelets: 178 10*3/uL (ref 140–400)
RBC: 3.82 10*6/uL — ABNORMAL LOW (ref 4.20–5.82)
RDW: 17 % — ABNORMAL HIGH (ref 11.0–14.6)
WBC: 5.3 10*3/uL (ref 4.0–10.3)

## 2014-12-13 LAB — URIC ACID (CC13): Uric Acid, Serum: 8.4 mg/dl — ABNORMAL HIGH (ref 2.6–7.4)

## 2014-12-13 NOTE — Assessment & Plan Note (Signed)
This is likely anemia of chronic disease and the underlying bone marrow disease. The patient denies recent history of bleeding such as epistaxis, hematuria or hematochezia. He is asymptomatic from the anemia. We will observe for now

## 2014-12-13 NOTE — Assessment & Plan Note (Signed)
So far, the patient tolerated treatment very well with no side effects. He has complete resolution of leukocytosis. Clinically, he appears to be responding to treatment. I plan to see him back a month from now for further toxicity review. We will continue to same treatment without changes of dosage

## 2014-12-13 NOTE — Telephone Encounter (Signed)
Pt confirmed labs/ov per 01/05 POF, gave pt AVS..... KJ °

## 2014-12-13 NOTE — Progress Notes (Signed)
[place] OFFICE PROGRESS NOTE  Patient Care Team: [name], MD as PCP - General (Internal Medicine)  SUMMARY OF ONCOLOGIC HISTORY:   Lymphoma, small lymphocytic   11/10/2011 Initial Diagnosis Lymphoma, small lymphocytic   11/09/2014 Pathology Results Ct guided biopsy of abdominal mass was positive for SLL without signs of transformation   11/28/2014 -  Chemotherapy The patient is started on Ibrutinib    INTERVAL HISTORY: Please see below for problem oriented charting. He feels well. Denies side effects of treatment. Denies any bruising. No recent diarrhea. His energy level is better. Denies any pain.  REVIEW OF SYSTEMS:   Constitutional: Denies fevers, chills or abnormal weight loss Eyes: Denies blurriness of vision Ears, nose, mouth, throat, and face: Denies mucositis or sore throat Respiratory: Denies cough, dyspnea or wheezes Cardiovascular: Denies palpitation, chest discomfort or lower extremity swelling Gastrointestinal:  Denies nausea, heartburn or change in bowel habits Skin: Denies abnormal skin rashes Lymphatics: Denies new lymphadenopathy or easy bruising Neurological:Denies numbness, tingling or new weaknesses Behavioral/Psych: Mood is stable, no new changes  All other systems were reviewed with the patient and are negative.  I have reviewed the past medical history, past surgical history, social history and family history with the patient and they are unchanged from previous note.  ALLERGIES:  has No Known Allergies.  MEDICATIONS:  Current Outpatient Prescriptions  Medication Sig Dispense Refill  . finasteride (PROSCAR) 5 MG tablet Take 5 mg by mouth daily with breakfast.     . fish oil-omega-3 fatty acids 1000 MG capsule Take 1 g by mouth daily with breakfast.     . furosemide (LASIX) 20 MG tablet Take 20 mg by mouth every morning.     Marland Kitchen glucosamine-chondroitin 500-400 MG tablet Take 1 tablet by mouth daily.     . Hypromellose (GENTEAL OP) Place  1 drop into both eyes 2 (two) times daily as needed. Dry eyes    . ibrutinib (IMBRUVICA) 140 MG capsul Take 3 capsules (420 mg total) by mouth daily. 90 capsule 0  . lansoprazole (PREVACID) 30 MG capsule Take 30 mg by mouth daily.      . Multiple Vitamins-Minerals (ICAPS MV PO) Take 1 tablet by mouth daily.     No current facility-administered medications for this visit.    PHYSICAL EXAMINATION: ECOG PERFORMANCE STATUS: 0 - Asymptomatic  Filed Vitals:   12/13/14 1504  BP: 134/58  Pulse: 78  Temp: 97.4 F (36.3 C)  Resp: 18   Filed Weights   12/13/14 1504  Weight: 136 lb 11.2 oz (62.007 kg)    GENERAL:alert, no distress and comfortable SKIN: skin color, texture, turgor are normal, no rashes or significant lesions EYES: normal, Conjunctiva are pink and non-injected, sclera clear Musculoskeletal:no cyanosis of digits and no clubbing  NEURO: alert & oriented x 3 with fluent speech, no focal motor/sensory deficits  LABORATORY DATA:  I have reviewed the data as listed    Component Value Date/Time   NA 142 12/13/2014 1445   NA 139 05/27/2013 1338   NA 142 05/12/2012 0739   K 4.0 12/13/2014 1445   K 4.6 05/27/2013 1338   K 4.5 05/12/2012 0739   CL 104 05/27/2013 1338   CL 103 04/19/2013 0828   CL 100 05/12/2012 0739   CO2 29 12/13/2014 1445   CO2 30 05/27/2013 1338   CO2 30 05/12/2012 0739   GLUCOSE 85 12/13/2014 1445   GLUCOSE 96 05/27/2013 1338   GLUCOSE 101* 04/19/2013 0828   GLUCOSE  122* 05/12/2012 0739   BUN 24.0 12/13/2014 1445   BUN 19 05/27/2013 1338   BUN 24* 05/12/2012 0739   CREATININE 1.0 12/13/2014 1445   CREATININE 1.13 05/27/2013 1338   CREATININE 1.6* 05/12/2012 0739   CALCIUM 9.1 12/13/2014 1445   CALCIUM 9.4 05/27/2013 1338   CALCIUM 8.7 05/12/2012 0739   PROT 5.8* 12/13/2014 1445   PROT 5.7* 05/27/2013 1338   PROT 6.3* 05/12/2012 0739   ALBUMIN 3.4* 12/13/2014 1445   ALBUMIN 3.2* 05/27/2013 1338   AST 17 12/13/2014 1445   AST 195* 05/27/2013  1338   AST 22 05/12/2012 0739   ALT 13 12/13/2014 1445   ALT 174* 05/27/2013 1338   ALT 27 05/12/2012 0739   ALKPHOS 101 12/13/2014 1445   ALKPHOS 151* 05/27/2013 1338   ALKPHOS 109* 05/12/2012 0739   BILITOT 0.91 12/13/2014 1445   BILITOT 0.6 05/27/2013 1338   BILITOT 0.80 05/12/2012 0739   GFRNONAA 55* 05/27/2013 1338   GFRAA 64* 05/27/2013 1338    No results found for: SPEP, UPEP  Lab Results  Component Value Date   WBC 5.3 12/13/2014   NEUTROABS 2.6 12/13/2014   HGB 12.0* 12/13/2014   HCT 37.1* 12/13/2014   MCV 97.2 12/13/2014   PLT 178 12/13/2014      Chemistry      Component Value Date/Time   NA 142 12/13/2014 1445   NA 139 05/27/2013 1338   NA 142 05/12/2012 0739   K 4.0 12/13/2014 1445   K 4.6 05/27/2013 1338   K 4.5 05/12/2012 0739   CL 104 05/27/2013 1338   CL 103 04/19/2013 0828   CL 100 05/12/2012 0739   CO2 29 12/13/2014 1445   CO2 30 05/27/2013 1338   CO2 30 05/12/2012 0739   BUN 24.0 12/13/2014 1445   BUN 19 05/27/2013 1338   BUN 24* 05/12/2012 0739   CREATININE 1.0 12/13/2014 1445   CREATININE 1.13 05/27/2013 1338   CREATININE 1.6* 05/12/2012 0739      Component Value Date/Time   CALCIUM 9.1 12/13/2014 1445   CALCIUM 9.4 05/27/2013 1338   CALCIUM 8.7 05/12/2012 0739   ALKPHOS 101 12/13/2014 1445   ALKPHOS 151* 05/27/2013 1338   ALKPHOS 109* 05/12/2012 0739   AST 17 12/13/2014 1445   AST 195* 05/27/2013 1338   AST 22 05/12/2012 0739   ALT 13 12/13/2014 1445   ALT 174* 05/27/2013 1338   ALT 27 05/12/2012 0739   BILITOT 0.91 12/13/2014 1445   BILITOT 0.6 05/27/2013 1338   BILITOT 0.80 05/12/2012 0739     ASSESSMENT & PLAN:  Lymphoma, small lymphocytic So far, the patient tolerated treatment very well with no side effects. He has complete resolution of leukocytosis. Clinically, he appears to be responding to treatment. I plan to see him back a month from now for further toxicity review. We will continue to same treatment without  changes of dosage   Anemia in neoplastic disease This is likely anemia of chronic disease and the underlying bone marrow disease. The patient denies recent history of bleeding such as epistaxis, hematuria or hematochezia. He is asymptomatic from the anemia. We will observe for now   No orders of the defined types were placed in this encounter.   All questions were answered. The patient knows to call the clinic with any problems, questions or concerns. No barriers to learning was detected. I spent 15 minutes counseling the patient face to face. The total time spent in the appointment was 20  minutes and more than 50% was on counseling and review of test results     [facility], Bobie Kistler, MD 12/13/2014 4:17 PM

## 2015-01-10 ENCOUNTER — Encounter: Payer: Self-pay | Admitting: Hematology and Oncology

## 2015-01-10 ENCOUNTER — Telehealth: Payer: Self-pay | Admitting: Hematology and Oncology

## 2015-01-10 ENCOUNTER — Ambulatory Visit (HOSPITAL_BASED_OUTPATIENT_CLINIC_OR_DEPARTMENT_OTHER): Payer: Medicare Other | Admitting: Hematology and Oncology

## 2015-01-10 ENCOUNTER — Other Ambulatory Visit (HOSPITAL_BASED_OUTPATIENT_CLINIC_OR_DEPARTMENT_OTHER): Payer: Medicare Other

## 2015-01-10 VITALS — BP 117/60 | HR 78 | Temp 97.5°F | Resp 18 | Ht 65.0 in | Wt 136.7 lb

## 2015-01-10 DIAGNOSIS — J029 Acute pharyngitis, unspecified: Secondary | ICD-10-CM | POA: Insufficient documentation

## 2015-01-10 DIAGNOSIS — N182 Chronic kidney disease, stage 2 (mild): Secondary | ICD-10-CM

## 2015-01-10 DIAGNOSIS — N183 Chronic kidney disease, stage 3 (moderate): Secondary | ICD-10-CM

## 2015-01-10 DIAGNOSIS — C8300 Small cell B-cell lymphoma, unspecified site: Secondary | ICD-10-CM

## 2015-01-10 DIAGNOSIS — C8359 Lymphoblastic (diffuse) lymphoma, extranodal and solid organ sites: Secondary | ICD-10-CM

## 2015-01-10 DIAGNOSIS — N179 Acute kidney failure, unspecified: Secondary | ICD-10-CM | POA: Insufficient documentation

## 2015-01-10 DIAGNOSIS — D630 Anemia in neoplastic disease: Secondary | ICD-10-CM

## 2015-01-10 DIAGNOSIS — N1330 Unspecified hydronephrosis: Secondary | ICD-10-CM

## 2015-01-10 LAB — CBC WITH DIFFERENTIAL/PLATELET
BASO%: 0.2 % (ref 0.0–2.0)
BASOS ABS: 0 10*3/uL (ref 0.0–0.1)
EOS%: 1.7 % (ref 0.0–7.0)
Eosinophils Absolute: 0.1 10*3/uL (ref 0.0–0.5)
HCT: 38.1 % — ABNORMAL LOW (ref 38.4–49.9)
HEMOGLOBIN: 12.1 g/dL — AB (ref 13.0–17.1)
LYMPH%: 28.5 % (ref 14.0–49.0)
MCH: 30.1 pg (ref 27.2–33.4)
MCHC: 31.8 g/dL — ABNORMAL LOW (ref 32.0–36.0)
MCV: 94.5 fL (ref 79.3–98.0)
MONO#: 0.6 10*3/uL (ref 0.1–0.9)
MONO%: 8.4 % (ref 0.0–14.0)
NEUT#: 4.7 10*3/uL (ref 1.5–6.5)
NEUT%: 61.2 % (ref 39.0–75.0)
Platelets: 202 10*3/uL (ref 140–400)
RBC: 4.03 10*6/uL — ABNORMAL LOW (ref 4.20–5.82)
RDW: 14.6 % (ref 11.0–14.6)
WBC: 7.6 10*3/uL (ref 4.0–10.3)
lymph#: 2.2 10*3/uL (ref 0.9–3.3)

## 2015-01-10 LAB — COMPREHENSIVE METABOLIC PANEL (CC13)
ALT: 12 U/L (ref 0–55)
AST: 15 U/L (ref 5–34)
Albumin: 3.2 g/dL — ABNORMAL LOW (ref 3.5–5.0)
Alkaline Phosphatase: 104 U/L (ref 40–150)
Anion Gap: 11 mEq/L (ref 3–11)
BUN: 36.4 mg/dL — ABNORMAL HIGH (ref 7.0–26.0)
CHLORIDE: 101 meq/L (ref 98–109)
CO2: 30 mEq/L — ABNORMAL HIGH (ref 22–29)
Calcium: 8.5 mg/dL (ref 8.4–10.4)
Creatinine: 1.3 mg/dL (ref 0.7–1.3)
EGFR: 47 mL/min/{1.73_m2} — AB (ref >90)
GLUCOSE: 94 mg/dL (ref 70–140)
Potassium: 4 mEq/L (ref 3.5–5.1)
Sodium: 142 mEq/L (ref 136–145)
Total Bilirubin: 0.69 mg/dL (ref 0.20–1.20)
Total Protein: 5.4 g/dL — ABNORMAL LOW (ref 6.4–8.3)

## 2015-01-10 LAB — LACTATE DEHYDROGENASE (CC13): LDH: 145 U/L (ref 125–245)

## 2015-01-10 NOTE — Assessment & Plan Note (Signed)
So far, the patient tolerated treatment very well with no side effects. He has complete resolution of leukocytosis. Clinically, he appears to be responding to treatment. I plan to see him back a month from now for further toxicity review. We will continue to same treatment without changes of dosage. I want to order a PET CT scan to assess response to treatment next month. I will prefer PET CT scan due to chronic kidney disease and better assessment for lymphoma.

## 2015-01-10 NOTE — Telephone Encounter (Signed)
gv pt avs report and appts for march. central will call re pet scan - pt aware.

## 2015-01-10 NOTE — Assessment & Plan Note (Signed)
This is likely anemia of chronic disease and the underlying bone marrow disease. The patient denies recent history of bleeding such as epistaxis, hematuria or hematochezia. He is asymptomatic from the anemia. We will observe for now

## 2015-01-10 NOTE — Assessment & Plan Note (Signed)
He has chronic kidney disease and hydronephrosis. I recommend increase oral fluid intake. I will reassess kidney function next month along with PET CT scan.

## 2015-01-10 NOTE — Progress Notes (Signed)
[place] OFFICE PROGRESS NOTE  Patient Care Team: [name], MD as PCP - General (Internal Medicine)  SUMMARY OF ONCOLOGIC HISTORY:   Lymphoma, small lymphocytic   11/10/2011 Initial Diagnosis Lymphoma, small lymphocytic   11/09/2014 Pathology Results Ct guided biopsy of abdominal mass was positive for SLL without signs of transformation   11/28/2014 -  Chemotherapy The patient is started on Ibrutinib    INTERVAL HISTORY: Please see below for problem oriented charting. He tolerated treatment well. He complained of new sore throat. Denies any cough, fevers or chills. He has mild frequent bowel movement but no diarrhea. Denies new lymphadenopathy.  REVIEW OF SYSTEMS:   Constitutional: Denies fevers, chills or abnormal weight loss Eyes: Denies blurriness of vision Ears, nose, mouth, throat, and face: Denies mucositis  Respiratory: Denies cough, dyspnea or wheezes Cardiovascular: Denies palpitation, chest discomfort or lower extremity swelling Gastrointestinal:  Denies nausea, heartburn or change in bowel habits Skin: Denies abnormal skin rashes Lymphatics: Denies new lymphadenopathy or easy bruising Neurological:Denies numbness, tingling or new weaknesses Behavioral/Psych: Mood is stable, no new changes  All other systems were reviewed with the patient and are negative.  I have reviewed the past medical history, past surgical history, social history and family history with the patient and they are unchanged from previous note.  ALLERGIES:  has No Known Allergies.  MEDICATIONS:  Current Outpatient Prescriptions  Medication Sig Dispense Refill  . finasteride (PROSCAR) 5 MG tablet Take 5 mg by mouth daily with breakfast.     . fish oil-omega-3 fatty acids 1000 MG capsule Take 1 g by mouth daily with breakfast.     . furosemide (LASIX) 20 MG tablet Take 20 mg by mouth every morning.     Marland Kitchen glucosamine-chondroitin 500-400 MG tablet Take 1 tablet by mouth daily.     .  Hypromellose (GENTEAL OP) Place 1 drop into both eyes 2 (two) times daily as needed. Dry eyes    . ibrutinib (IMBRUVICA) 140 MG capsul Take 3 capsules (420 mg total) by mouth daily. 90 capsule 0  . lansoprazole (PREVACID) 30 MG capsule Take 30 mg by mouth daily.      . Multiple Vitamins-Minerals (ICAPS MV PO) Take 1 tablet by mouth daily.     No current facility-administered medications for this visit.    PHYSICAL EXAMINATION: ECOG PERFORMANCE STATUS: 1 - Symptomatic but completely ambulatory  Filed Vitals:   01/10/15 1448  BP: 117/60  Pulse: 78  Temp: 97.5 F (36.4 C)  Resp: 18   Filed Weights   01/10/15 1448  Weight: 136 lb 11.2 oz (62.007 kg)    GENERAL:alert, no distress and comfortable SKIN: skin color, texture, turgor are normal, no rashes or significant lesions EYES: normal, Conjunctiva are pink and non-injected, sclera clear OROPHARYNX:no exudate, no erythema and lips, buccal mucosa, and tongue normal  NECK: supple, thyroid normal size, non-tender, without nodularity LYMPH:  no palpable lymphadenopathy in the cervical, axillary or inguinal LUNGS: clear to auscultation and percussion with normal breathing effort HEART: regular rate & rhythm and no murmurs with mild bilateral lower extremity edema ABDOMEN:abdomen soft, non-tender and normal bowel sounds. Previously palpable lymph node mass has regressed in size Musculoskeletal:no cyanosis of digits and no clubbing  NEURO: alert & oriented x 3 with fluent speech, no focal motor/sensory deficits  LABORATORY DATA:  I have reviewed the data as listed    Component Value Date/Time   NA 142 01/10/2015 1423   NA 139 05/27/2013 1338   NA  142 05/12/2012 0739   K 4.0 01/10/2015 1423   K 4.6 05/27/2013 1338   K 4.5 05/12/2012 0739   CL 104 05/27/2013 1338   CL 103 04/19/2013 0828   CL 100 05/12/2012 0739   CO2 30* 01/10/2015 1423   CO2 30 05/27/2013 1338   CO2 30 05/12/2012 0739   GLUCOSE 94 01/10/2015 1423   GLUCOSE 96  05/27/2013 1338   GLUCOSE 101* 04/19/2013 0828   GLUCOSE 122* 05/12/2012 0739   BUN 36.4* 01/10/2015 1423   BUN 19 05/27/2013 1338   BUN 24* 05/12/2012 0739   CREATININE 1.3 01/10/2015 1423   CREATININE 1.13 05/27/2013 1338   CREATININE 1.6* 05/12/2012 0739   CALCIUM 8.5 01/10/2015 1423   CALCIUM 9.4 05/27/2013 1338   CALCIUM 8.7 05/12/2012 0739   PROT 5.4* 01/10/2015 1423   PROT 5.7* 05/27/2013 1338   PROT 6.3* 05/12/2012 0739   ALBUMIN 3.2* 01/10/2015 1423   ALBUMIN 3.2* 05/27/2013 1338   AST 15 01/10/2015 1423   AST 195* 05/27/2013 1338   AST 22 05/12/2012 0739   ALT 12 01/10/2015 1423   ALT 174* 05/27/2013 1338   ALT 27 05/12/2012 0739   ALKPHOS 104 01/10/2015 1423   ALKPHOS 151* 05/27/2013 1338   ALKPHOS 109* 05/12/2012 0739   BILITOT 0.69 01/10/2015 1423   BILITOT 0.6 05/27/2013 1338   BILITOT 0.80 05/12/2012 0739   GFRNONAA 55* 05/27/2013 1338   GFRAA 64* 05/27/2013 1338    No results found for: SPEP, UPEP  Lab Results  Component Value Date   WBC 7.6 01/10/2015   NEUTROABS 4.7 01/10/2015   HGB 12.1* 01/10/2015   HCT 38.1* 01/10/2015   MCV 94.5 01/10/2015   PLT 202 01/10/2015      Chemistry      Component Value Date/Time   NA 142 01/10/2015 1423   NA 139 05/27/2013 1338   NA 142 05/12/2012 0739   K 4.0 01/10/2015 1423   K 4.6 05/27/2013 1338   K 4.5 05/12/2012 0739   CL 104 05/27/2013 1338   CL 103 04/19/2013 0828   CL 100 05/12/2012 0739   CO2 30* 01/10/2015 1423   CO2 30 05/27/2013 1338   CO2 30 05/12/2012 0739   BUN 36.4* 01/10/2015 1423   BUN 19 05/27/2013 1338   BUN 24* 05/12/2012 0739   CREATININE 1.3 01/10/2015 1423   CREATININE 1.13 05/27/2013 1338   CREATININE 1.6* 05/12/2012 0739      Component Value Date/Time   CALCIUM 8.5 01/10/2015 1423   CALCIUM 9.4 05/27/2013 1338   CALCIUM 8.7 05/12/2012 0739   ALKPHOS 104 01/10/2015 1423   ALKPHOS 151* 05/27/2013 1338   ALKPHOS 109* 05/12/2012 0739   AST 15 01/10/2015 1423   AST 195*  05/27/2013 1338   AST 22 05/12/2012 0739   ALT 12 01/10/2015 1423   ALT 174* 05/27/2013 1338   ALT 27 05/12/2012 0739   BILITOT 0.69 01/10/2015 1423   BILITOT 0.6 05/27/2013 1338   BILITOT 0.80 05/12/2012 0739      ASSESSMENT & PLAN:  Lymphoma, small lymphocytic So far, the patient tolerated treatment very well with no side effects. He has complete resolution of leukocytosis. Clinically, he appears to be responding to treatment. I plan to see him back a month from now for further toxicity review. We will continue to same treatment without changes of dosage. I want to order a PET CT scan to assess response to treatment next month. I will prefer PET CT scan  due to chronic kidney disease and better assessment for lymphoma.      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 The patient denies recent history of bleeding such as epistaxis, hematuria or hematochezia. He is asymptomatic from the anemia. We will observe for now   Sore throat He has mild sore throat. Clinically, he does not appear to bacterial infection. He has no leukocytosis. I do not recommend antibiotic therapy. I recommend conservative management only with salt water gargle.   Chronic kidney disease (CKD), stage II (mild) He has chronic kidney disease and hydronephrosis. I recommend increase oral fluid intake. I will reassess kidney function next month along with PET CT scan.    Orders Placed This Encounter  Procedures  . NM PET Image Restag (PS) Skull Base To Thigh    Standing Status: Future     Number of Occurrences:      Standing Expiration Date: 03/11/2016    Order Specific Question:  Reason for Exam (SYMPTOM  OR DIAGNOSIS REQUIRED)    Answer:  staging CLL/lymphoma, assess response to Rx    Order Specific Question:  Preferred imaging location?    Answer:  [facility]   All questions were answered. The patient knows to call the  clinic with any problems, questions or concerns. No barriers to learning was detected. I spent 25 minutes counseling the patient face to face. The total time spent in the appointment was 30 minutes and more than 50% was on counseling and review of test results     [facility], [place], MD 01/10/2015 3:09 PM

## 2015-01-10 NOTE — Assessment & Plan Note (Signed)
He has mild sore throat. Clinically, he does not appear to bacterial infection. He has no leukocytosis. I do not recommend antibiotic therapy. I recommend conservative management only with salt water gargle.

## 2015-01-31 ENCOUNTER — Other Ambulatory Visit: Payer: Self-pay | Admitting: Dermatology

## 2015-02-24 ENCOUNTER — Ambulatory Visit (HOSPITAL_COMMUNITY)
Admission: RE | Admit: 2015-02-24 | Discharge: 2015-02-24 | Disposition: A | Discharge status: Home / Self Care | Payer: Medicare Other | Source: Ambulatory Visit | Attending: Hematology and Oncology | Admitting: Hematology and Oncology

## 2015-02-24 ENCOUNTER — Other Ambulatory Visit (HOSPITAL_BASED_OUTPATIENT_CLINIC_OR_DEPARTMENT_OTHER): Payer: Medicare Other

## 2015-02-24 DIAGNOSIS — C8300 Small cell B-cell lymphoma, unspecified site: Secondary | ICD-10-CM

## 2015-02-24 DIAGNOSIS — C8359 Lymphoblastic (diffuse) lymphoma, extranodal and solid organ sites: Secondary | ICD-10-CM

## 2015-02-24 DIAGNOSIS — Z79899 Other long term (current) drug therapy: Secondary | ICD-10-CM | POA: Diagnosis not present

## 2015-02-24 LAB — CBC WITH DIFFERENTIAL/PLATELET
BASO%: 1.2 % (ref 0.0–2.0)
Basophils Absolute: 0.1 10*3/uL (ref 0.0–0.1)
EOS ABS: 0 10*3/uL (ref 0.0–0.5)
EOS%: 0.2 % (ref 0.0–7.0)
HCT: 44.2 % (ref 38.4–49.9)
HGB: 14 g/dL (ref 13.0–17.1)
LYMPH%: 18.7 % (ref 14.0–49.0)
MCH: 28.8 pg (ref 27.2–33.4)
MCHC: 31.8 g/dL — AB (ref 32.0–36.0)
MCV: 90.7 fL (ref 79.3–98.0)
MONO#: 0.8 10*3/uL (ref 0.1–0.9)
MONO%: 8.5 % (ref 0.0–14.0)
NEUT%: 71.4 % (ref 39.0–75.0)
NEUTROS ABS: 7 10*3/uL — AB (ref 1.5–6.5)
PLATELETS: 162 10*3/uL (ref 140–400)
RBC: 4.87 10*6/uL (ref 4.20–5.82)
RDW: 15 % — AB (ref 11.0–14.6)
WBC: 9.8 10*3/uL (ref 4.0–10.3)
lymph#: 1.8 10*3/uL (ref 0.9–3.3)

## 2015-02-24 LAB — COMPREHENSIVE METABOLIC PANEL (CC13)
ALT: 12 U/L (ref 0–55)
ANION GAP: 11 meq/L (ref 3–11)
AST: 15 U/L (ref 5–34)
Albumin: 3.1 g/dL — ABNORMAL LOW (ref 3.5–5.0)
Alkaline Phosphatase: 84 U/L (ref 40–150)
BUN: 29.8 mg/dL — ABNORMAL HIGH (ref 7.0–26.0)
CHLORIDE: 102 meq/L (ref 98–109)
CO2: 25 meq/L (ref 22–29)
CREATININE: 1 mg/dL (ref 0.7–1.3)
Calcium: 8.8 mg/dL (ref 8.4–10.4)
EGFR: 61 mL/min/{1.73_m2} — AB (ref >90)
Glucose: 116 mg/dl (ref 70–140)
Potassium: 3.5 mEq/L (ref 3.5–5.1)
Sodium: 139 mEq/L (ref 136–145)
TOTAL PROTEIN: 5.6 g/dL — AB (ref 6.4–8.3)
Total Bilirubin: 0.76 mg/dL (ref 0.20–1.20)

## 2015-02-24 LAB — GLUCOSE, CAPILLARY: Glucose-Capillary: 104 mg/dL — ABNORMAL HIGH (ref 70–99)

## 2015-02-24 MED ORDER — FLUDEOXYGLUCOSE F - 18 (FDG) INJECTION
6.7000 | Freq: Once | INTRAVENOUS | Status: AC | PRN
  Administered 2015-02-24: 6.7 via INTRAVENOUS

## 2015-02-28 ENCOUNTER — Ambulatory Visit (HOSPITAL_COMMUNITY): Payer: Medicare Other

## 2015-02-28 ENCOUNTER — Other Ambulatory Visit: Payer: Medicare Other

## 2015-03-02 ENCOUNTER — Encounter: Payer: Self-pay | Admitting: Hematology and Oncology

## 2015-03-02 ENCOUNTER — Ambulatory Visit (HOSPITAL_BASED_OUTPATIENT_CLINIC_OR_DEPARTMENT_OTHER): Payer: Medicare Other | Admitting: Hematology and Oncology

## 2015-03-02 ENCOUNTER — Telehealth: Payer: Self-pay | Admitting: Hematology and Oncology

## 2015-03-02 VITALS — BP 136/57 | HR 79 | Temp 98.1°F | Resp 22 | Ht 66.0 in | Wt 137.0 lb

## 2015-03-02 DIAGNOSIS — N182 Chronic kidney disease, stage 2 (mild): Secondary | ICD-10-CM | POA: Diagnosis not present

## 2015-03-02 DIAGNOSIS — C8359 Lymphoblastic (diffuse) lymphoma, extranodal and solid organ sites: Secondary | ICD-10-CM | POA: Diagnosis not present

## 2015-03-02 DIAGNOSIS — C8300 Small cell B-cell lymphoma, unspecified site: Secondary | ICD-10-CM

## 2015-03-02 NOTE — Telephone Encounter (Signed)
gave and printed appt sched and avs for pt for June °

## 2015-03-02 NOTE — Progress Notes (Signed)
[place] OFFICE PROGRESS NOTE  Patient Care Team: [name], MD as PCP - General (Internal Medicine)  SUMMARY OF ONCOLOGIC HISTORY:   Lymphoma, small lymphocytic   11/10/2011 Initial Diagnosis Lymphoma, small lymphocytic   11/09/2014 Pathology Results Ct guided biopsy of abdominal mass was positive for SLL without signs of transformation   11/28/2014 -  Chemotherapy The patient is started on Ibrutinib   02/24/2015 Imaging Repeat PET/CT scan show greater than 50% reduction in the tumor.    INTERVAL HISTORY: Please see below for problem oriented charting.  he is seen today to review test results. In the meantime, he feels well. He tolerated treatment well. Unfortunately, his wife passed away recently.  REVIEW OF SYSTEMS:   Constitutional: Denies fevers, chills or abnormal weight loss Eyes: Denies blurriness of vision Ears, nose, mouth, throat, and face: Denies mucositis or sore throat Respiratory: Denies cough, dyspnea or wheezes Cardiovascular: Denies palpitation, chest discomfort or lower extremity swelling Gastrointestinal:  Denies nausea, heartburn or change in bowel habits Skin: Denies abnormal skin rashes Lymphatics: Denies new lymphadenopathy or easy bruising Neurological:Denies numbness, tingling or new weaknesses Behavioral/Psych: Mood is stable, no new changes  All other systems were reviewed with the patient and are negative.  I have reviewed the past medical history, past surgical history, social history and family history with the patient and they are unchanged from previous note.  ALLERGIES:  has No Known Allergies.  MEDICATIONS:  Current Outpatient Prescriptions  Medication Sig Dispense Refill  . finasteride (PROSCAR) 5 MG tablet Take 5 mg by mouth daily with breakfast.     . fish oil-omega-3 fatty acids 1000 MG capsule Take 1 g by mouth daily with breakfast.     . furosemide (LASIX) 20 MG tablet Take 20 mg by mouth every morning.     Marland Kitchen  glucosamine-chondroitin 500-400 MG tablet Take 1 tablet by mouth daily.     . Hypromellose (GENTEAL OP) Place 1 drop into both eyes 2 (two) times daily as needed. Dry eyes    . ibrutinib (IMBRUVICA) 140 MG capsul Take 3 capsules (420 mg total) by mouth daily. 90 capsule 0  . lansoprazole (PREVACID) 30 MG capsule Take 30 mg by mouth daily.      . Multiple Vitamins-Minerals (ICAPS MV PO) Take 1 tablet by mouth daily.     No current facility-administered medications for this visit.    PHYSICAL EXAMINATION: ECOG PERFORMANCE STATUS: 0 - Asymptomatic  Filed Vitals:   03/02/15 1253  BP: 136/57  Pulse: 79  Temp: 98.1 F (36.7 C)  Resp: 22   Filed Weights   03/02/15 1253  Weight: 137 lb (62.143 kg)    GENERAL:alert, no distress and comfortable SKIN: skin color, texture, turgor are normal, no rashes or significant lesions EYES: normal, Conjunctiva are pink and non-injected, sclera clear OROPHARYNX:no exudate, no erythema and lips, buccal mucosa, and tongue normal  NECK: supple, thyroid normal size, non-tender, without nodularity LYMPH:  no palpable lymphadenopathy in the cervical, axillary or inguinal LUNGS: clear to auscultation and percussion with normal breathing effort HEART: regular rate & rhythm and no murmurs and no lower extremity edema ABDOMEN:abdomen soft, non-tender and normal bowel sounds Musculoskeletal:no cyanosis of digits and no clubbing  NEURO: alert & oriented x 3 with fluent speech, no focal motor/sensory deficits  LABORATORY DATA:  I have reviewed the data as listed    Component Value Date/Time   NA 139 02/24/2015 1102   NA 139 05/27/2013 1338   NA 142  05/12/2012 0739   K 3.5 02/24/2015 1102   K 4.6 05/27/2013 1338   K 4.5 05/12/2012 0739   CL 104 05/27/2013 1338   CL 103 04/19/2013 0828   CL 100 05/12/2012 0739   CO2 25 02/24/2015 1102   CO2 30 05/27/2013 1338   CO2 30 05/12/2012 0739   GLUCOSE 116 02/24/2015 1102   GLUCOSE 96 05/27/2013 1338    GLUCOSE 101* 04/19/2013 0828   GLUCOSE 122* 05/12/2012 0739   BUN 29.8* 02/24/2015 1102   BUN 19 05/27/2013 1338   BUN 24* 05/12/2012 0739   CREATININE 1.0 02/24/2015 1102   CREATININE 1.13 05/27/2013 1338   CREATININE 1.6* 05/12/2012 0739   CALCIUM 8.8 02/24/2015 1102   CALCIUM 9.4 05/27/2013 1338   CALCIUM 8.7 05/12/2012 0739   PROT 5.6* 02/24/2015 1102   PROT 5.7* 05/27/2013 1338   PROT 6.3* 05/12/2012 0739   ALBUMIN 3.1* 02/24/2015 1102   ALBUMIN 3.2* 05/27/2013 1338   AST 15 02/24/2015 1102   AST 195* 05/27/2013 1338   AST 22 05/12/2012 0739   ALT 12 02/24/2015 1102   ALT 174* 05/27/2013 1338   ALT 27 05/12/2012 0739   ALKPHOS 84 02/24/2015 1102   ALKPHOS 151* 05/27/2013 1338   ALKPHOS 109* 05/12/2012 0739   BILITOT 0.76 02/24/2015 1102   BILITOT 0.6 05/27/2013 1338   BILITOT 0.80 05/12/2012 0739   GFRNONAA 55* 05/27/2013 1338   GFRAA 64* 05/27/2013 1338    No results found for: SPEP, UPEP  Lab Results  Component Value Date   WBC 9.8 02/24/2015   NEUTROABS 7.0* 02/24/2015   HGB 14.0 02/24/2015   HCT 44.2 02/24/2015   MCV 90.7 02/24/2015   PLT 162 02/24/2015      Chemistry      Component Value Date/Time   NA 139 02/24/2015 1102   NA 139 05/27/2013 1338   NA 142 05/12/2012 0739   K 3.5 02/24/2015 1102   K 4.6 05/27/2013 1338   K 4.5 05/12/2012 0739   CL 104 05/27/2013 1338   CL 103 04/19/2013 0828   CL 100 05/12/2012 0739   CO2 25 02/24/2015 1102   CO2 30 05/27/2013 1338   CO2 30 05/12/2012 0739   BUN 29.8* 02/24/2015 1102   BUN 19 05/27/2013 1338   BUN 24* 05/12/2012 0739   CREATININE 1.0 02/24/2015 1102   CREATININE 1.13 05/27/2013 1338   CREATININE 1.6* 05/12/2012 0739      Component Value Date/Time   CALCIUM 8.8 02/24/2015 1102   CALCIUM 9.4 05/27/2013 1338   CALCIUM 8.7 05/12/2012 0739   ALKPHOS 84 02/24/2015 1102   ALKPHOS 151* 05/27/2013 1338   ALKPHOS 109* 05/12/2012 0739   AST 15 02/24/2015 1102   AST 195* 05/27/2013 1338   AST 22  05/12/2012 0739   ALT 12 02/24/2015 1102   ALT 174* 05/27/2013 1338   ALT 27 05/12/2012 0739   BILITOT 0.76 02/24/2015 1102   BILITOT 0.6 05/27/2013 1338   BILITOT 0.80 05/12/2012 0739       RADIOGRAPHIC STUDIES: I reviewed the imaging study myself I have personally reviewed the radiological images as listed and agreed with the findings in the report.   ASSESSMENT & PLAN:  Lymphoma, small lymphocytic He is doing very well. He has virtually no side effects. His blood count is improving and PET/CT scan shows significant reduction of the lymphadenopathy. I recommend he same dose without dose adjustment. I plan to see him back in 3 months for repeat  blood work, history and physical examination and to repeat imaging study again in 6 months, around September 2016.   Chronic kidney disease (CKD), stage II (mild) He has chronic kidney disease   since his last visit, his kidney function has normalized.    No orders of the defined types were placed in this encounter.   All questions were answered. The patient knows to call the clinic with any problems, questions or concerns. No barriers to learning was detected. I spent 15 minutes counseling the patient face to face. The total time spent in the appointment was 20 minutes and more than 50% was on counseling and review of test results     [facility], Janal Haak, MD 03/02/2015 1:41 PM

## 2015-03-02 NOTE — Assessment & Plan Note (Addendum)
He has chronic kidney disease   since his last visit, his kidney function has normalized.

## 2015-03-02 NOTE — Assessment & Plan Note (Signed)
He is doing very well. He has virtually no side effects. His blood count is improving and PET/CT scan shows significant reduction of the lymphadenopathy. I recommend he same dose without dose adjustment. I plan to see him back in 3 months for repeat blood work, history and physical examination and to repeat imaging study again in 6 months, around September 2016.

## 2015-06-01 ENCOUNTER — Encounter: Payer: Self-pay | Admitting: Hematology and Oncology

## 2015-06-01 ENCOUNTER — Telehealth: Payer: Self-pay | Admitting: Hematology and Oncology

## 2015-06-01 ENCOUNTER — Other Ambulatory Visit (HOSPITAL_BASED_OUTPATIENT_CLINIC_OR_DEPARTMENT_OTHER): Payer: Medicare Other

## 2015-06-01 ENCOUNTER — Ambulatory Visit (HOSPITAL_BASED_OUTPATIENT_CLINIC_OR_DEPARTMENT_OTHER): Payer: Medicare Other | Admitting: Hematology and Oncology

## 2015-06-01 VITALS — BP 154/60 | HR 74 | Temp 98.5°F | Resp 18 | Ht 66.0 in | Wt 143.0 lb

## 2015-06-01 DIAGNOSIS — C8339 Diffuse large B-cell lymphoma, extranodal and solid organ sites: Secondary | ICD-10-CM | POA: Diagnosis present

## 2015-06-01 DIAGNOSIS — C8300 Small cell B-cell lymphoma, unspecified site: Secondary | ICD-10-CM

## 2015-06-01 DIAGNOSIS — C8389 Other non-follicular lymphoma, extranodal and solid organ sites: Secondary | ICD-10-CM | POA: Diagnosis present

## 2015-06-01 DIAGNOSIS — D6959 Other secondary thrombocytopenia: Secondary | ICD-10-CM | POA: Diagnosis not present

## 2015-06-01 DIAGNOSIS — K4090 Unilateral inguinal hernia, without obstruction or gangrene, not specified as recurrent: Secondary | ICD-10-CM | POA: Insufficient documentation

## 2015-06-01 DIAGNOSIS — T50905A Adverse effect of unspecified drugs, medicaments and biological substances, initial encounter: Secondary | ICD-10-CM

## 2015-06-01 DIAGNOSIS — D696 Thrombocytopenia, unspecified: Secondary | ICD-10-CM | POA: Insufficient documentation

## 2015-06-01 DIAGNOSIS — I10 Essential (primary) hypertension: Secondary | ICD-10-CM

## 2015-06-01 LAB — CBC WITH DIFFERENTIAL/PLATELET
BASO%: 0.4 % (ref 0.0–2.0)
BASOS ABS: 0 10*3/uL (ref 0.0–0.1)
EOS%: 0.2 % (ref 0.0–7.0)
Eosinophils Absolute: 0 10*3/uL (ref 0.0–0.5)
HEMATOCRIT: 42.9 % (ref 38.4–49.9)
HEMOGLOBIN: 13.9 g/dL (ref 13.0–17.1)
LYMPH#: 1.4 10*3/uL (ref 0.9–3.3)
LYMPH%: 29 % (ref 14.0–49.0)
MCH: 29.4 pg (ref 27.2–33.4)
MCHC: 32.4 g/dL (ref 32.0–36.0)
MCV: 90.9 fL (ref 79.3–98.0)
MONO#: 0.8 10*3/uL (ref 0.1–0.9)
MONO%: 17 % — ABNORMAL HIGH (ref 0.0–14.0)
NEUT#: 2.5 10*3/uL (ref 1.5–6.5)
NEUT%: 53.4 % (ref 39.0–75.0)
Platelets: 123 10*3/uL — ABNORMAL LOW (ref 140–400)
RBC: 4.72 10*6/uL (ref 4.20–5.82)
RDW: 15.7 % — ABNORMAL HIGH (ref 11.0–14.6)
WBC: 4.7 10*3/uL (ref 4.0–10.3)

## 2015-06-01 LAB — COMPREHENSIVE METABOLIC PANEL (CC13)
ALT: 20 U/L (ref 0–55)
ANION GAP: 7 meq/L (ref 3–11)
AST: 22 U/L (ref 5–34)
Albumin: 3.3 g/dL — ABNORMAL LOW (ref 3.5–5.0)
Alkaline Phosphatase: 103 U/L (ref 40–150)
BILIRUBIN TOTAL: 0.83 mg/dL (ref 0.20–1.20)
BUN: 24.8 mg/dL (ref 7.0–26.0)
CALCIUM: 8.6 mg/dL (ref 8.4–10.4)
CO2: 30 mEq/L — ABNORMAL HIGH (ref 22–29)
Chloride: 104 mEq/L (ref 98–109)
Creatinine: 1.1 mg/dL (ref 0.7–1.3)
EGFR: 56 mL/min/{1.73_m2} — AB (ref >90)
Glucose: 83 mg/dl (ref 70–140)
Potassium: 4.2 mEq/L (ref 3.5–5.1)
Sodium: 141 mEq/L (ref 136–145)
Total Protein: 5.5 g/dL — ABNORMAL LOW (ref 6.4–8.3)

## 2015-06-01 NOTE — Assessment & Plan Note (Signed)
He is doing very well. He has virtually no side effects. His blood count is improving and PET/CT scan shows significant reduction of the lymphadenopathy. I recommend he same dose without dose adjustment. I plan to see him back in 3 months for repeat blood work, history and physical examination and repeat imaging study around September 2016.

## 2015-06-01 NOTE — Telephone Encounter (Signed)
Gave and printed appt sched and avs for pt for SEpt °

## 2015-06-01 NOTE — Assessment & Plan Note (Signed)
He has hypertension detected today. Could be coat hypertension. I recommend observation for now but if his blood pressure remained high in the next visit, he may benefit from starting on blood pressure medication.

## 2015-06-01 NOTE — Assessment & Plan Note (Signed)
This is likely due to recent treatment. The patient denies recent history of bleeding such as epistaxis, hematuria or hematochezia. He is asymptomatic from the low platelet count. I will observe for now.  he does not require transfusion now. I will continue the chemotherapy at current dose without dosage adjustment.  If the thrombocytopenia gets progressive worse in the future, I might have to delay his treatment or adjust the chemotherapy dose.   

## 2015-06-01 NOTE — Progress Notes (Signed)
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    INTERVAL HISTORY: Please see below for problem oriented charting. He feels well. Denies side effects from chemotherapy such as diarrhea, increasing fatigue or bleeding. He bruises easily.  REVIEW OF SYSTEMS:   Constitutional: Denies fevers, chills or abnormal weight loss Eyes: Denies blurriness of vision Ears, nose, mouth, throat, and face: Denies mucositis or sore throat Respiratory: Denies cough, dyspnea or wheezes Cardiovascular: Denies palpitation, chest discomfort or lower extremity swelling Gastrointestinal:  Denies nausea, heartburn or change in bowel habits Skin: Denies abnormal skin rashes Lymphatics: Denies new lymphadenopathy  Neurological:Denies numbness, tingling or new weaknesses Behavioral/Psych: Mood is stable, no new changes  All other systems were reviewed with the patient and are negative.  I have reviewed the past medical history, past surgical history, social history and family history with the patient and they are unchanged from previous note.  ALLERGIES:  has No Known Allergies.  MEDICATIONS:  Current Outpatient Prescriptions  Medication Sig Dispense Refill  . finasteride (PROSCAR) 5 MG tablet Take 5 mg by mouth daily with breakfast.     . fish oil-omega-3 fatty acids 1000 MG capsule Take 1 g by mouth daily with breakfast.     . furosemide (LASIX) 20 MG tablet Take 20 mg by mouth every morning.     Marland Kitchen glucosamine-chondroitin 500-400 MG tablet Take 1  tablet by mouth daily.     . Hypromellose (GENTEAL OP) Place 1 drop into both eyes 2 (two) times daily as needed. Dry eyes    . ibrutinib (IMBRUVICA) 140 MG capsul Take 3 capsules (420 mg total) by mouth daily. 90 capsule 0  . lansoprazole (PREVACID) 30 MG capsule Take 30 mg by mouth daily.      . Multiple Vitamins-Minerals (ICAPS MV PO) Take 1 tablet by mouth daily.     No current facility-administered medications for this visit.    PHYSICAL EXAMINATION: ECOG PERFORMANCE STATUS: 1 - Symptomatic but completely ambulatory  Filed Vitals:   06/01/15 1244  BP: 154/60  Pulse: 74  Temp: 98.5 F (36.9 C)  Resp: 18   Filed Weights   06/01/15 1244  Weight: 143 lb (64.864 kg)    GENERAL:alert, no distress and comfortable SKIN: He has extensive skin bruises.  EYES: normal, Conjunctiva are pink and non-injected, sclera clear OROPHARYNX:no exudate, no erythema and lips, buccal mucosa, and tongue normal  NECK: supple, thyroid normal size, non-tender, without nodularity LYMPH:  no palpable lymphadenopathy in the cervical, axillary or inguinal LUNGS: clear to auscultation and percussion with normal breathing effort HEART: regular rate & rhythm and no murmurs and no lower extremity edema ABDOMEN:abdomen soft, non-tender and normal bowel sounds. Palpable abdominal wall hernia and right inguinal hernia Musculoskeletal:no cyanosis of digits and no clubbing  NEURO: alert & oriented x 3 with fluent speech, no focal motor/sensory deficits  LABORATORY DATA:  I have reviewed the data as listed    Component Value Date/Time   NA 141 06/01/2015 1234   NA 139 05/27/2013 1338   NA 142 05/12/2012 0739  K 4.2 06/01/2015 1234   K 4.6 05/27/2013 1338   K 4.5 05/12/2012 0739   CL 104 05/27/2013 1338   CL 103 04/19/2013 0828   CL 100 05/12/2012 0739   CO2 30* 06/01/2015 1234   CO2 30 05/27/2013 1338   CO2 30 05/12/2012 0739   GLUCOSE 83 06/01/2015 1234   GLUCOSE 96 05/27/2013 1338   GLUCOSE 101*  04/19/2013 0828   GLUCOSE 122* 05/12/2012 0739   BUN 24.8 06/01/2015 1234   BUN 19 05/27/2013 1338   BUN 24* 05/12/2012 0739   CREATININE 1.1 06/01/2015 1234   CREATININE 1.13 05/27/2013 1338   CREATININE 1.6* 05/12/2012 0739   CALCIUM 8.6 06/01/2015 1234   CALCIUM 9.4 05/27/2013 1338   CALCIUM 8.7 05/12/2012 0739   PROT 5.5* 06/01/2015 1234   PROT 5.7* 05/27/2013 1338   PROT 6.3* 05/12/2012 0739   ALBUMIN 3.3* 06/01/2015 1234   ALBUMIN 3.2* 05/27/2013 1338   AST 22 06/01/2015 1234   AST 195* 05/27/2013 1338   AST 22 05/12/2012 0739   ALT 20 06/01/2015 1234   ALT 174* 05/27/2013 1338   ALT 27 05/12/2012 0739   ALKPHOS 103 06/01/2015 1234   ALKPHOS 151* 05/27/2013 1338   ALKPHOS 109* 05/12/2012 0739   BILITOT 0.83 06/01/2015 1234   BILITOT 0.6 05/27/2013 1338   BILITOT 0.80 05/12/2012 0739   GFRNONAA 55* 05/27/2013 1338   GFRAA 64* 05/27/2013 1338    No results found for: SPEP, UPEP  Lab Results  Component Value Date   WBC 4.7 06/01/2015   NEUTROABS 2.5 06/01/2015   HGB 13.9 06/01/2015   HCT 42.9 06/01/2015   MCV 90.9 06/01/2015   PLT 123* 06/01/2015      Chemistry      Component Value Date/Time   NA 141 06/01/2015 1234   NA 139 05/27/2013 1338   NA 142 05/12/2012 0739   K 4.2 06/01/2015 1234   K 4.6 05/27/2013 1338   K 4.5 05/12/2012 0739   CL 104 05/27/2013 1338   CL 103 04/19/2013 0828   CL 100 05/12/2012 0739   CO2 30* 06/01/2015 1234   CO2 30 05/27/2013 1338   CO2 30 05/12/2012 0739   BUN 24.8 06/01/2015 1234   BUN 19 05/27/2013 1338   BUN 24* 05/12/2012 0739   CREATININE 1.1 06/01/2015 1234   CREATININE 1.13 05/27/2013 1338   CREATININE 1.6* 05/12/2012 0739      Component Value Date/Time   CALCIUM 8.6 06/01/2015 1234   CALCIUM 9.4 05/27/2013 1338   CALCIUM 8.7 05/12/2012 0739   ALKPHOS 103 06/01/2015 1234   ALKPHOS 151* 05/27/2013 1338   ALKPHOS 109* 05/12/2012 0739   AST 22 06/01/2015 1234   AST 195* 05/27/2013 1338   AST 22 05/12/2012  0739   ALT 20 06/01/2015 1234   ALT 174* 05/27/2013 1338   ALT 27 05/12/2012 0739   BILITOT 0.83 06/01/2015 1234   BILITOT 0.6 05/27/2013 1338   BILITOT 0.80 05/12/2012 0739       ASSESSMENT & PLAN:  Lymphoma, small lymphocytic He is doing very well. He has virtually no side effects. His blood count is improving and PET/CT scan shows significant reduction of the lymphadenopathy. I recommend he same dose without dose adjustment. I plan to see him back in 3 months for repeat blood work, history and physical examination and repeat imaging study around September 2016.    Thrombocytopenia due to drugs This is likely due to recent treatment. The patient denies recent  history of bleeding such as epistaxis, hematuria or hematochezia. He is asymptomatic from the low platelet count. I will observe for now.  he does not require transfusion now. I will continue the chemotherapy at current dose without dosage adjustment.  If the thrombocytopenia gets progressive worse in the future, I might have to delay his treatment or adjust the chemotherapy dose.    Hernia, inguinal, right He has a large right inguinal hernia but is not strangulated and he is not symptomatic. Recommend observation only.  Essential hypertension He has hypertension detected today. Could be coat hypertension. I recommend observation for now but if his blood pressure remained high in the next visit, he may benefit from starting on blood pressure medication.   Orders Placed This Encounter  Procedures  . NM PET Image Restag (PS) Skull Base To Thigh    Standing Status: Future     Number of Occurrences:      Standing Expiration Date: 07/31/2016    Order Specific Question:  Reason for Exam (SYMPTOM  OR DIAGNOSIS REQUIRED)    Answer:  staging CLL/lymphoma, assess response to Rx    Order Specific Question:  Preferred imaging location?    Answer:  [facility]   All questions were answered. The patient knows to call  the clinic with any problems, questions or concerns. No barriers to learning was detected. I spent 15 minutes counseling the patient face to face. The total time spent in the appointment was 20 minutes and more than 50% was on counseling and review of test results     [facility], [place], MD 06/01/2015 1:30 PM

## 2015-06-01 NOTE — Assessment & Plan Note (Signed)
He has a large right inguinal hernia but is not strangulated and he is not symptomatic. Recommend observation only.

## 2015-06-05 ENCOUNTER — Other Ambulatory Visit: Payer: Self-pay

## 2015-08-30 ENCOUNTER — Other Ambulatory Visit (HOSPITAL_BASED_OUTPATIENT_CLINIC_OR_DEPARTMENT_OTHER): Payer: Medicare Other

## 2015-08-30 DIAGNOSIS — C8300 Small cell B-cell lymphoma, unspecified site: Secondary | ICD-10-CM

## 2015-08-30 DIAGNOSIS — C8339 Diffuse large B-cell lymphoma, extranodal and solid organ sites: Secondary | ICD-10-CM | POA: Diagnosis not present

## 2015-08-30 LAB — CBC WITH DIFFERENTIAL/PLATELET
BASO%: 0.4 % (ref 0.0–2.0)
Basophils Absolute: 0 10*3/uL (ref 0.0–0.1)
EOS ABS: 0.2 10*3/uL (ref 0.0–0.5)
EOS%: 3.5 % (ref 0.0–7.0)
HEMATOCRIT: 44 % (ref 38.4–49.9)
HEMOGLOBIN: 14 g/dL (ref 13.0–17.1)
LYMPH#: 1.8 10*3/uL (ref 0.9–3.3)
LYMPH%: 32.5 % (ref 14.0–49.0)
MCH: 29.3 pg (ref 27.2–33.4)
MCHC: 31.8 g/dL — ABNORMAL LOW (ref 32.0–36.0)
MCV: 92.1 fL (ref 79.3–98.0)
MONO#: 0.8 10*3/uL (ref 0.1–0.9)
MONO%: 13.2 % (ref 0.0–14.0)
NEUT#: 2.9 10*3/uL (ref 1.5–6.5)
NEUT%: 50.4 % (ref 39.0–75.0)
Platelets: 135 10*3/uL — ABNORMAL LOW (ref 140–400)
RBC: 4.78 10*6/uL (ref 4.20–5.82)
RDW: 15.7 % — AB (ref 11.0–14.6)
WBC: 5.7 10*3/uL (ref 4.0–10.3)

## 2015-08-30 LAB — COMPREHENSIVE METABOLIC PANEL (CC13)
ALBUMIN: 3.3 g/dL — AB (ref 3.5–5.0)
ALT: 15 U/L (ref 0–55)
AST: 17 U/L (ref 5–34)
Alkaline Phosphatase: 109 U/L (ref 40–150)
Anion Gap: 7 mEq/L (ref 3–11)
BUN: 27.6 mg/dL — AB (ref 7.0–26.0)
CO2: 29 mEq/L (ref 22–29)
Calcium: 9.5 mg/dL (ref 8.4–10.4)
Chloride: 107 mEq/L (ref 98–109)
Creatinine: 1.2 mg/dL (ref 0.7–1.3)
EGFR: 52 mL/min/{1.73_m2} — ABNORMAL LOW (ref >90)
GLUCOSE: 92 mg/dL (ref 70–140)
Potassium: 4.3 mEq/L (ref 3.5–5.1)
SODIUM: 143 meq/L (ref 136–145)
Total Bilirubin: 0.66 mg/dL (ref 0.20–1.20)
Total Protein: 5.5 g/dL — ABNORMAL LOW (ref 6.4–8.3)

## 2015-08-31 ENCOUNTER — Encounter: Payer: Self-pay | Admitting: Hematology and Oncology

## 2015-08-31 ENCOUNTER — Telehealth: Payer: Self-pay | Admitting: Hematology and Oncology

## 2015-08-31 ENCOUNTER — Other Ambulatory Visit: Payer: Self-pay | Admitting: Hematology and Oncology

## 2015-08-31 ENCOUNTER — Ambulatory Visit (HOSPITAL_BASED_OUTPATIENT_CLINIC_OR_DEPARTMENT_OTHER): Payer: Medicare Other | Admitting: Hematology and Oncology

## 2015-08-31 VITALS — BP 138/55 | HR 76 | Temp 97.9°F | Resp 17 | Ht 66.0 in | Wt 146.5 lb

## 2015-08-31 DIAGNOSIS — Z23 Encounter for immunization: Secondary | ICD-10-CM

## 2015-08-31 DIAGNOSIS — D6959 Other secondary thrombocytopenia: Secondary | ICD-10-CM | POA: Diagnosis not present

## 2015-08-31 DIAGNOSIS — N183 Chronic kidney disease, stage 3 unspecified: Secondary | ICD-10-CM

## 2015-08-31 DIAGNOSIS — T50905A Adverse effect of unspecified drugs, medicaments and biological substances, initial encounter: Secondary | ICD-10-CM

## 2015-08-31 DIAGNOSIS — C8300 Small cell B-cell lymphoma, unspecified site: Secondary | ICD-10-CM | POA: Diagnosis not present

## 2015-08-31 DIAGNOSIS — Z299 Encounter for prophylactic measures, unspecified: Secondary | ICD-10-CM

## 2015-08-31 MED ORDER — INFLUENZA VAC SPLIT QUAD 0.5 ML IM SUSY
0.5000 mL | PREFILLED_SYRINGE | Freq: Once | INTRAMUSCULAR | Status: AC
Start: 2015-08-31 — End: 2015-08-31
  Administered 2015-08-31: 0.5 mL via INTRAMUSCULAR
  Filled 2015-08-31: qty 0.5

## 2015-08-31 NOTE — Assessment & Plan Note (Signed)
He is doing very well. He has virtually no side effects. His blood count is improving and his last PET/CT scan shows significant reduction of the lymphadenopathy. I recommend he same dose without dose adjustment. I plan to see him back in 3 months for repeat blood work, history and physical examination and repeat imaging study around December 2016.

## 2015-08-31 NOTE — Progress Notes (Signed)
[place] OFFICE PROGRESS NOTE  Patient Care Team: [name], MD as PCP - General (Internal Medicine)  SUMMARY OF ONCOLOGIC HISTORY:   Lymphoma, small lymphocytic   11/10/2011 Initial Diagnosis Lymphoma, small lymphocytic   11/09/2014 Pathology Results Ct guided biopsy of abdominal mass was positive for SLL without signs of transformation   11/28/2014 -  Chemotherapy The patient is started on Ibrutinib   02/24/2015 Imaging Repeat PET/CT scan show greater than 50% reduction in the tumor.    INTERVAL HISTORY: Please see below for problem oriented charting. He feels well. Denies side effects from chemotherapy such as diarrhea, increasing fatigue or bleeding. He bruises easily. Denies recent diarrhea.  REVIEW OF SYSTEMS:   Constitutional: Denies fevers, chills or abnormal weight loss Eyes: Denies blurriness of vision Ears, nose, mouth, throat, and face: Denies mucositis or sore throat Respiratory: Denies cough, dyspnea or wheezes Cardiovascular: Denies palpitation, chest discomfort or lower extremity swelling Gastrointestinal:  Denies nausea, heartburn or change in bowel habits Skin: Denies abnormal skin rashes Lymphatics: Denies new lymphadenopathy or easy bruising Neurological:Denies numbness, tingling or new weaknesses Behavioral/Psych: Mood is stable, no new changes  All other systems were reviewed with the patient and are negative.  I have reviewed the past medical history, past surgical history, social history and family history with the patient and they are unchanged from previous note.  ALLERGIES:  has No Known Allergies.  MEDICATIONS:  Current Outpatient Prescriptions  Medication Sig Dispense Refill  . finasteride (PROSCAR) 5 MG tablet Take 5 mg by mouth daily with breakfast.     . fish oil-omega-3 fatty acids 1000 MG capsule Take 1 g by mouth daily with breakfast.     . furosemide (LASIX) 20 MG tablet Take 20 mg by mouth every morning.     Marland Kitchen  glucosamine-chondroitin 500-400 MG tablet Take 1 tablet by mouth daily.     . Hypromellose (GENTEAL OP) Place 1 drop into both eyes 2 (two) times daily as needed. Dry eyes    . ibrutinib (IMBRUVICA) 140 MG capsul Take 3 capsules (420 mg total) by mouth daily. 90 capsule 0  . lansoprazole (PREVACID) 30 MG capsule Take 30 mg by mouth daily.      . Multiple Vitamins-Minerals (ICAPS MV PO) Take 1 tablet by mouth daily.     No current facility-administered medications for this visit.    PHYSICAL EXAMINATION: ECOG PERFORMANCE STATUS: 0 - Asymptomatic  Filed Vitals:   08/31/15 1328  BP: 138/55  Pulse: 76  Temp: 97.9 F (36.6 C)  Resp: 17   Filed Weights   08/31/15 1328  Weight: 146 lb 8 oz (66.452 kg)    GENERAL:alert, no distress and comfortable SKIN: skin color, texture, turgor are normal, no rashes or significant lesions EYES: normal, Conjunctiva are pink and non-injected, sclera clear OROPHARYNX:no exudate, no erythema and lips, buccal mucosa, and tongue normal  NECK: supple, thyroid normal size, non-tender, without nodularity LYMPH:  no palpable lymphadenopathy in the cervical, axillary or inguinal LUNGS: clear to auscultation and percussion with normal breathing effort HEART: regular rate & rhythm and no murmurs and no lower extremity edema ABDOMEN:abdomen soft, non-tender and normal bowel sounds. Noted abdominal wall hernia and right inguinal hernia Musculoskeletal:no cyanosis of digits and no clubbing  NEURO: alert & oriented x 3 with fluent speech, no focal motor/sensory deficits  LABORATORY DATA:  I have reviewed the data as listed    Component Value Date/Time   NA 143 08/30/2015 1002   NA 139  05/27/2013 1338   NA 142 05/12/2012 0739   K 4.3 08/30/2015 1002   K 4.6 05/27/2013 1338   K 4.5 05/12/2012 0739   CL 104 05/27/2013 1338   CL 103 04/19/2013 0828   CL 100 05/12/2012 0739   CO2 29 08/30/2015 1002   CO2 30 05/27/2013 1338   CO2 30 05/12/2012 0739   GLUCOSE  92 08/30/2015 1002   GLUCOSE 96 05/27/2013 1338   GLUCOSE 101* 04/19/2013 0828   GLUCOSE 122* 05/12/2012 0739   BUN 27.6* 08/30/2015 1002   BUN 19 05/27/2013 1338   BUN 24* 05/12/2012 0739   CREATININE 1.2 08/30/2015 1002   CREATININE 1.13 05/27/2013 1338   CREATININE 1.6* 05/12/2012 0739   CALCIUM 9.5 08/30/2015 1002   CALCIUM 9.4 05/27/2013 1338   CALCIUM 8.7 05/12/2012 0739   PROT 5.5* 08/30/2015 1002   PROT 5.7* 05/27/2013 1338   PROT 6.3* 05/12/2012 0739   ALBUMIN 3.3* 08/30/2015 1002   ALBUMIN 3.2* 05/27/2013 1338   AST 17 08/30/2015 1002   AST 195* 05/27/2013 1338   AST 22 05/12/2012 0739   ALT 15 08/30/2015 1002   ALT 174* 05/27/2013 1338   ALT 27 05/12/2012 0739   ALKPHOS 109 08/30/2015 1002   ALKPHOS 151* 05/27/2013 1338   ALKPHOS 109* 05/12/2012 0739   BILITOT 0.66 08/30/2015 1002   BILITOT 0.6 05/27/2013 1338   BILITOT 0.80 05/12/2012 0739   GFRNONAA 55* 05/27/2013 1338   GFRAA 64* 05/27/2013 1338    No results found for: SPEP, UPEP  Lab Results  Component Value Date   WBC 5.7 08/30/2015   NEUTROABS 2.9 08/30/2015   HGB 14.0 08/30/2015   HCT 44.0 08/30/2015   MCV 92.1 08/30/2015   PLT 135* 08/30/2015      Chemistry      Component Value Date/Time   NA 143 08/30/2015 1002   NA 139 05/27/2013 1338   NA 142 05/12/2012 0739   K 4.3 08/30/2015 1002   K 4.6 05/27/2013 1338   K 4.5 05/12/2012 0739   CL 104 05/27/2013 1338   CL 103 04/19/2013 0828   CL 100 05/12/2012 0739   CO2 29 08/30/2015 1002   CO2 30 05/27/2013 1338   CO2 30 05/12/2012 0739   BUN 27.6* 08/30/2015 1002   BUN 19 05/27/2013 1338   BUN 24* 05/12/2012 0739   CREATININE 1.2 08/30/2015 1002   CREATININE 1.13 05/27/2013 1338   CREATININE 1.6* 05/12/2012 0739      Component Value Date/Time   CALCIUM 9.5 08/30/2015 1002   CALCIUM 9.4 05/27/2013 1338   CALCIUM 8.7 05/12/2012 0739   ALKPHOS 109 08/30/2015 1002   ALKPHOS 151* 05/27/2013 1338   ALKPHOS 109* 05/12/2012 0739   AST 17  08/30/2015 1002   AST 195* 05/27/2013 1338   AST 22 05/12/2012 0739   ALT 15 08/30/2015 1002   ALT 174* 05/27/2013 1338   ALT 27 05/12/2012 0739   BILITOT 0.66 08/30/2015 1002   BILITOT 0.6 05/27/2013 1338   BILITOT 0.80 05/12/2012 0739      ASSESSMENT & PLAN:  Lymphoma, small lymphocytic He is doing very well. He has virtually no side effects. His blood count is improving and his last PET/CT scan shows significant reduction of the lymphadenopathy. I recommend he same dose without dose adjustment. I plan to see him back in 3 months for repeat blood work, history and physical examination and repeat imaging study around December 2016.    Chronic kidney disease (CKD),  stage III (moderate) He has chronic kidney disease  Since his last visit, his kidney function has normalized.    Thrombocytopenia due to drugs This is likely due to recent treatment. The patient denies recent history of bleeding such as epistaxis, hematuria or hematochezia. He is asymptomatic from the low platelet count. I will observe for now.    Preventive measure We discussed the importance of preventive care and reviewed the vaccination programs. He does not have any prior allergic reactions to influenza vaccination. He agrees to proceed with influenza vaccination today and we will administer it today at the clinic.    Orders Placed This Encounter  Procedures  . NM PET Image Restag (PS) Skull Base To Thigh    Standing Status: Future     Number of Occurrences:      Standing Expiration Date: 10/30/2016    Order Specific Question:  Reason for Exam (SYMPTOM  OR DIAGNOSIS REQUIRED)    Answer:  staging CLL/lymphoma    Order Specific Question:  Preferred imaging location?    Answer:  [facility]   All questions were answered. The patient knows to call the clinic with any problems, questions or concerns. No barriers to learning was detected. I spent 15 minutes counseling the patient face to face. The  total time spent in the appointment was 20 minutes and more than 50% was on counseling and review of test results     [facility], [place], MD 08/31/2015 1:47 PM

## 2015-08-31 NOTE — Telephone Encounter (Signed)
Pt confirmed labs/ov per 09/22 POF, gave pt AVS and Calendar... KJ °

## 2015-08-31 NOTE — Assessment & Plan Note (Signed)
This is likely due to recent treatment. The patient denies recent history of bleeding such as epistaxis, hematuria or hematochezia. He is asymptomatic from the low platelet count. I will observe for now.  

## 2015-08-31 NOTE — Assessment & Plan Note (Signed)
He has chronic kidney disease  Since his last visit, his kidney function has normalized.

## 2015-08-31 NOTE — Assessment & Plan Note (Signed)
We discussed the importance of preventive care and reviewed the vaccination programs. He does not have any prior allergic reactions to influenza vaccination. He agrees to proceed with influenza vaccination today and we will administer it today at the clinic.  

## 2015-11-24 ENCOUNTER — Telehealth: Payer: Self-pay | Admitting: Hematology and Oncology

## 2015-11-24 NOTE — Telephone Encounter (Signed)
lvm for pt regarding to time change of appt on 12.22.Marland KitchenMarland KitchenMarland Kitchen

## 2015-11-27 ENCOUNTER — Telehealth: Payer: Self-pay | Admitting: *Deleted

## 2015-11-27 ENCOUNTER — Telehealth: Payer: Self-pay | Admitting: Hematology and Oncology

## 2015-11-27 NOTE — Telephone Encounter (Signed)
duplicate

## 2015-11-27 NOTE — Telephone Encounter (Signed)
cld pt and adv of appt time for 12/21 & 12/22 per staff message from Dr Alvy Bimler

## 2015-11-27 NOTE — Telephone Encounter (Signed)
-----   Message from Heath Lark, MD sent at 11/27/2015 10:33 AM EST ----- Regarding: appt Can you call and remind him of his labs & PET on 12/21 and appt to see me on 12/22 has been moved up a bit earlier. Thanks

## 2015-11-28 ENCOUNTER — Other Ambulatory Visit: Payer: Self-pay | Admitting: Hematology and Oncology

## 2015-11-28 DIAGNOSIS — C8300 Small cell B-cell lymphoma, unspecified site: Secondary | ICD-10-CM

## 2015-11-29 ENCOUNTER — Other Ambulatory Visit (HOSPITAL_BASED_OUTPATIENT_CLINIC_OR_DEPARTMENT_OTHER): Payer: Medicare Other

## 2015-11-29 ENCOUNTER — Telehealth: Payer: Self-pay | Admitting: *Deleted

## 2015-11-29 ENCOUNTER — Ambulatory Visit (HOSPITAL_COMMUNITY)
Admission: RE | Admit: 2015-11-29 | Discharge: 2015-11-29 | Disposition: A | Discharge status: Home / Self Care | Payer: Medicare Other | Source: Ambulatory Visit | Attending: Hematology and Oncology | Admitting: Hematology and Oncology

## 2015-11-29 DIAGNOSIS — J479 Bronchiectasis, uncomplicated: Secondary | ICD-10-CM | POA: Diagnosis not present

## 2015-11-29 DIAGNOSIS — K429 Umbilical hernia without obstruction or gangrene: Secondary | ICD-10-CM | POA: Diagnosis not present

## 2015-11-29 DIAGNOSIS — K5730 Diverticulosis of large intestine without perforation or abscess without bleeding: Secondary | ICD-10-CM | POA: Diagnosis not present

## 2015-11-29 DIAGNOSIS — K449 Diaphragmatic hernia without obstruction or gangrene: Secondary | ICD-10-CM | POA: Insufficient documentation

## 2015-11-29 DIAGNOSIS — C8300 Small cell B-cell lymphoma, unspecified site: Secondary | ICD-10-CM

## 2015-11-29 DIAGNOSIS — I517 Cardiomegaly: Secondary | ICD-10-CM | POA: Insufficient documentation

## 2015-11-29 DIAGNOSIS — I700 Atherosclerosis of aorta: Secondary | ICD-10-CM | POA: Diagnosis not present

## 2015-11-29 DIAGNOSIS — I2510 Atherosclerotic heart disease of native coronary artery without angina pectoris: Secondary | ICD-10-CM | POA: Diagnosis not present

## 2015-11-29 DIAGNOSIS — R918 Other nonspecific abnormal finding of lung field: Secondary | ICD-10-CM | POA: Diagnosis not present

## 2015-11-29 DIAGNOSIS — R590 Localized enlarged lymph nodes: Secondary | ICD-10-CM | POA: Diagnosis not present

## 2015-11-29 DIAGNOSIS — N1330 Unspecified hydronephrosis: Secondary | ICD-10-CM | POA: Diagnosis not present

## 2015-11-29 DIAGNOSIS — N323 Diverticulum of bladder: Secondary | ICD-10-CM | POA: Insufficient documentation

## 2015-11-29 LAB — CBC WITH DIFFERENTIAL/PLATELET
BASO%: 0.4 % (ref 0.0–2.0)
BASOS ABS: 0 10*3/uL (ref 0.0–0.1)
EOS%: 2.4 % (ref 0.0–7.0)
Eosinophils Absolute: 0.1 10*3/uL (ref 0.0–0.5)
HCT: 45.5 % (ref 38.4–49.9)
HEMOGLOBIN: 14.5 g/dL (ref 13.0–17.1)
LYMPH%: 32.3 % (ref 14.0–49.0)
MCH: 29.5 pg (ref 27.2–33.4)
MCHC: 31.9 g/dL — ABNORMAL LOW (ref 32.0–36.0)
MCV: 92.7 fL (ref 79.3–98.0)
MONO#: 0.7 10*3/uL (ref 0.1–0.9)
MONO%: 13.6 % (ref 0.0–14.0)
NEUT%: 51.3 % (ref 39.0–75.0)
NEUTROS ABS: 2.5 10*3/uL (ref 1.5–6.5)
PLATELETS: 120 10*3/uL — AB (ref 140–400)
RBC: 4.91 10*6/uL (ref 4.20–5.82)
RDW: 14.7 % — AB (ref 11.0–14.6)
WBC: 4.9 10*3/uL (ref 4.0–10.3)
lymph#: 1.6 10*3/uL (ref 0.9–3.3)

## 2015-11-29 LAB — COMPREHENSIVE METABOLIC PANEL
ALBUMIN: 3.4 g/dL — AB (ref 3.5–5.0)
ALT: 15 U/L (ref 0–55)
ANION GAP: 8 meq/L (ref 3–11)
AST: 19 U/L (ref 5–34)
Alkaline Phosphatase: 106 U/L (ref 40–150)
BILIRUBIN TOTAL: 0.98 mg/dL (ref 0.20–1.20)
BUN: 24.2 mg/dL (ref 7.0–26.0)
CO2: 30 mEq/L — ABNORMAL HIGH (ref 22–29)
CREATININE: 1.3 mg/dL (ref 0.7–1.3)
Calcium: 9.1 mg/dL (ref 8.4–10.4)
Chloride: 105 mEq/L (ref 98–109)
EGFR: 46 mL/min/{1.73_m2} — ABNORMAL LOW (ref >90)
Glucose: 88 mg/dl (ref 70–140)
Potassium: 4.3 mEq/L (ref 3.5–5.1)
Sodium: 143 mEq/L (ref 136–145)
TOTAL PROTEIN: 5.7 g/dL — AB (ref 6.4–8.3)

## 2015-11-29 LAB — LACTATE DEHYDROGENASE: LDH: 190 U/L (ref 125–245)

## 2015-11-29 LAB — GLUCOSE, CAPILLARY: Glucose-Capillary: 78 mg/dL (ref 65–99)

## 2015-11-29 MED ORDER — FLUDEOXYGLUCOSE F - 18 (FDG) INJECTION
6.9000 | Freq: Once | INTRAVENOUS | Status: AC | PRN
  Administered 2015-11-29: 6.9 via INTRAVENOUS

## 2015-11-29 NOTE — Telephone Encounter (Signed)
Per Dr. Alvy Bimler, PET scan shows worsening hydronephrosis.  She requests Dr. Diona Fanti, Urology, notified.  Faxed copy of PET to Dr. Diona Fanti and called office and notified nurse Meredith Mody.  She will alert Dr. Diona Fanti to PET results.

## 2015-11-30 ENCOUNTER — Ambulatory Visit: Payer: Medicare Other | Admitting: Hematology and Oncology

## 2015-11-30 ENCOUNTER — Encounter: Payer: Self-pay | Admitting: Hematology and Oncology

## 2015-11-30 ENCOUNTER — Ambulatory Visit (HOSPITAL_BASED_OUTPATIENT_CLINIC_OR_DEPARTMENT_OTHER): Payer: Medicare Other | Admitting: Hematology and Oncology

## 2015-11-30 ENCOUNTER — Telehealth: Payer: Self-pay | Admitting: Hematology and Oncology

## 2015-11-30 VITALS — BP 122/64 | HR 74 | Temp 97.5°F | Resp 18 | Ht 66.0 in | Wt 152.7 lb

## 2015-11-30 DIAGNOSIS — N183 Chronic kidney disease, stage 3 unspecified: Secondary | ICD-10-CM

## 2015-11-30 DIAGNOSIS — C8300 Small cell B-cell lymphoma, unspecified site: Secondary | ICD-10-CM

## 2015-11-30 DIAGNOSIS — D696 Thrombocytopenia, unspecified: Secondary | ICD-10-CM

## 2015-11-30 DIAGNOSIS — N1330 Unspecified hydronephrosis: Secondary | ICD-10-CM | POA: Diagnosis not present

## 2015-11-30 NOTE — Assessment & Plan Note (Signed)
He has chronic kidney disease  Since his last visit, his kidney function is stable  I am very concerned about the worsening hydronephrosis and will consult urologist for further management.

## 2015-11-30 NOTE — Assessment & Plan Note (Signed)
He has significant hydronephrosis I am concerned that it will compromise his kidney function. I recommend urology consultation again and he agreed

## 2015-11-30 NOTE — Telephone Encounter (Signed)
GAVE PATIENT AVS REPORT AND APPOINTMENTS FOR MARCH. [facility] WILL CALL RE PET - PATIENT AWARE.

## 2015-11-30 NOTE — Assessment & Plan Note (Signed)
The most recent PET CT scan is reviewed with great detail. Overall, I felt that it represented more of stable disease rather than significant disease progression. The size of the mass of the abdominal mass have changed a little bit with very mild activity. The adjacent lymph node which seems a little bit more active has reduced in size significantly. Overall, I would really as stable disease. I recommend we continue on Ibrutinib and repeat another history, physical examination, blood work and imaging study in 3 months.

## 2015-11-30 NOTE — Progress Notes (Signed)
[facility] OFFICE PROGRESS NOTE  Patient Care Team: [name], MD as PCP - General (Internal Medicine) Franchot Gallo, MD as Consulting Physician (Urology)  SUMMARY OF ONCOLOGIC HISTORY:   Lymphoma, small lymphocytic ([place])   11/10/2011 Initial Diagnosis Lymphoma, small lymphocytic   11/09/2014 Pathology Results Ct guided biopsy of abdominal mass was positive for SLL without signs of transformation   11/28/2014 -  Chemotherapy The patient is started on Ibrutinib   02/24/2015 Imaging Repeat PET/CT scan show greater than 50% reduction in the tumor.   11/29/2015 Imaging PET scan showed persistent mass in the abdomen with mild change in size and activity and worsening hydronephrosis    INTERVAL HISTORY: Please see below for problem oriented charting.  the patient is seen here for further follow-up. He is doing very well. He denies abdominal pain. The patient denies any recent signs or symptoms of bleeding such as spontaneous epistaxis, hematuria or hematochezia.  Denies recent new lymphadenopathy or infection. He tolerated Ibrutinib well without any side effects.  REVIEW OF SYSTEMS:   Constitutional: Denies fevers, chills or abnormal weight loss Eyes: Denies blurriness of vision Ears, nose, mouth, throat, and face: Denies mucositis or sore throat Respiratory: Denies cough, dyspnea or wheezes Cardiovascular: Denies palpitation, chest discomfort or lower extremity swelling Gastrointestinal:  Denies nausea, heartburn or change in bowel habits Skin: Denies abnormal skin rashes Lymphatics: Denies new lymphadenopathy or easy bruising Neurological:Denies numbness, tingling or new weaknesses Behavioral/Psych: Mood is stable, no new changes  All other systems were reviewed with the patient and are negative.  I have reviewed the past medical history, past surgical history, social history and family history with the patient and they are unchanged from previous  note.  ALLERGIES:  has No Known Allergies.  MEDICATIONS:  Current Outpatient Prescriptions  Medication Sig Dispense Refill  . finasteride (PROSCAR) 5 MG tablet Take 5 mg by mouth daily with breakfast.     . fish oil-omega-3 fatty acids 1000 MG capsule Take 1 g by mouth daily with breakfast.     . furosemide (LASIX) 20 MG tablet Take 20 mg by mouth every morning.     Marland Kitchen glucosamine-chondroitin 500-400 MG tablet Take 1 tablet by mouth daily.     . Hypromellose (GENTEAL OP) Place 1 drop into both eyes 2 (two) times daily as needed. Dry eyes    . ibrutinib (IMBRUVICA) 140 MG capsul Take 3 capsules (420 mg total) by mouth daily. 90 capsule 0  . lansoprazole (PREVACID) 30 MG capsule Take 30 mg by mouth daily.      . Multiple Vitamins-Minerals (ICAPS MV PO) Take 1 tablet by mouth daily.     No current facility-administered medications for this visit.    PHYSICAL EXAMINATION: ECOG PERFORMANCE STATUS: 1 - Symptomatic but completely ambulatory  Filed Vitals:   11/30/15 1042  BP: 122/64  Pulse: 74  Temp: 97.5 F (36.4 C)  Resp: 18   Filed Weights   11/30/15 1042  Weight: 152 lb 11.2 oz (69.264 kg)    GENERAL:alert, no distress and comfortable SKIN: skin color, texture, turgor are normal, no rashes or significant lesions EYES: normal, Conjunctiva are pink and non-injected, sclera clear OROPHARYNX:no exudate, no erythema and lips, buccal mucosa, and tongue normal  NECK: supple, thyroid normal size, non-tender, without nodularity LYMPH:  no palpable lymphadenopathy in the cervical, axillary or inguinal LUNGS: clear to auscultation and percussion with normal breathing effort HEART: regular rate & rhythm and no murmurs and no lower extremity edema ABDOMEN:abdomen  soft, non-tender and normal bowel sounds. Noted abdominal hernia Musculoskeletal:no cyanosis of digits and no clubbing  NEURO: alert & oriented x 3 with fluent speech, no focal motor/sensory deficits  LABORATORY DATA:  I have  reviewed the data as listed    Component Value Date/Time   NA 143 11/29/2015 0950   NA 139 05/27/2013 1338   NA 142 05/12/2012 0739   K 4.3 11/29/2015 0950   K 4.6 05/27/2013 1338   K 4.5 05/12/2012 0739   CL 104 05/27/2013 1338   CL 103 04/19/2013 0828   CL 100 05/12/2012 0739   CO2 30* 11/29/2015 0950   CO2 30 05/27/2013 1338   CO2 30 05/12/2012 0739   GLUCOSE 88 11/29/2015 0950   GLUCOSE 96 05/27/2013 1338   GLUCOSE 101* 04/19/2013 0828   GLUCOSE 122* 05/12/2012 0739   BUN 24.2 11/29/2015 0950   BUN 19 05/27/2013 1338   BUN 24* 05/12/2012 0739   CREATININE 1.3 11/29/2015 0950   CREATININE 1.13 05/27/2013 1338   CREATININE 1.6* 05/12/2012 0739   CALCIUM 9.1 11/29/2015 0950   CALCIUM 9.4 05/27/2013 1338   CALCIUM 8.7 05/12/2012 0739   PROT 5.7* 11/29/2015 0950   PROT 5.7* 05/27/2013 1338   PROT 6.3* 05/12/2012 0739   ALBUMIN 3.4* 11/29/2015 0950   ALBUMIN 3.2* 05/27/2013 1338   ALBUMIN 3.0* 05/12/2012 0739   AST 19 11/29/2015 0950   AST 195* 05/27/2013 1338   AST 22 05/12/2012 0739   ALT 15 11/29/2015 0950   ALT 174* 05/27/2013 1338   ALT 27 05/12/2012 0739   ALKPHOS 106 11/29/2015 0950   ALKPHOS 151* 05/27/2013 1338   ALKPHOS 109* 05/12/2012 0739   BILITOT 0.98 11/29/2015 0950   BILITOT 0.6 05/27/2013 1338   BILITOT 0.80 05/12/2012 0739   GFRNONAA 55* 05/27/2013 1338   GFRAA 64* 05/27/2013 1338    No results found for: SPEP, UPEP  Lab Results  Component Value Date   WBC 4.9 11/29/2015   NEUTROABS 2.5 11/29/2015   HGB 14.5 11/29/2015   HCT 45.5 11/29/2015   MCV 92.7 11/29/2015   PLT 120* 11/29/2015      Chemistry      Component Value Date/Time   NA 143 11/29/2015 0950   NA 139 05/27/2013 1338   NA 142 05/12/2012 0739   K 4.3 11/29/2015 0950   K 4.6 05/27/2013 1338   K 4.5 05/12/2012 0739   CL 104 05/27/2013 1338   CL 103 04/19/2013 0828   CL 100 05/12/2012 0739   CO2 30* 11/29/2015 0950   CO2 30 05/27/2013 1338   CO2 30 05/12/2012 0739    BUN 24.2 11/29/2015 0950   BUN 19 05/27/2013 1338   BUN 24* 05/12/2012 0739   CREATININE 1.3 11/29/2015 0950   CREATININE 1.13 05/27/2013 1338   CREATININE 1.6* 05/12/2012 0739      Component Value Date/Time   CALCIUM 9.1 11/29/2015 0950   CALCIUM 9.4 05/27/2013 1338   CALCIUM 8.7 05/12/2012 0739   ALKPHOS 106 11/29/2015 0950   ALKPHOS 151* 05/27/2013 1338   ALKPHOS 109* 05/12/2012 0739   AST 19 11/29/2015 0950   AST 195* 05/27/2013 1338   AST 22 05/12/2012 0739   ALT 15 11/29/2015 0950   ALT 174* 05/27/2013 1338   ALT 27 05/12/2012 0739   BILITOT 0.98 11/29/2015 0950   BILITOT 0.6 05/27/2013 1338   BILITOT 0.80 05/12/2012 0739       RADIOGRAPHIC STUDIES: reviewed imaging scan with the patient I  have personally reviewed the radiological images as listed and agreed with the findings in the report. Nm Pet Image Restag (ps) Skull Base To Thigh  11/29/2015  CLINICAL DATA:  Subsequent treatment strategy for CLL / non-Hodgkin's lymphoma (small lymphocytic lymphoma) diagnosed in 2009. EXAM: NUCLEAR MEDICINE PET SKULL BASE TO THIGH TECHNIQUE: 6.9 mCi F-18 FDG was injected intravenously. Full-ring PET imaging was performed from the skull base to thigh after the radiotracer. CT data was obtained and used for attenuation correction and anatomic localization. FASTING BLOOD GLUCOSE:  Value: 78 mg/dl COMPARISON:  02/24/2015 PET-CT. FINDINGS: NECK No hypermetabolic lymph nodes in the neck. Partially visualized subgaleal lipoma in the midline frontal scalp. CHEST No hypermetabolic axillary, mediastinal or hilar nodes. Mild cardiomegaly. Left main and left anterior descending coronary atherosclerosis. Stable dilated main pulmonary artery (main pulmonary artery diameter 3.3 cm). Circumferential wall thickening in the mid to lower thoracic esophagus, unchanged, without associated esophageal hypermetabolism. There is mild to moderate varicoid and cylindrical bronchiectasis in the right greater the left  lower lobes. There is mild cylindrical and varicoid bronchiectasis in the dependent right upper lobe. There are associated patchy mild tree-in-bud opacities in the dependent right greater than left lungs at the areas of bronchiectasis. These findings are not appreciably changed. No acute consolidative airspace disease or significant pulmonary nodules. ABDOMEN/PELVIS There is a dominant 4.8 x 4.5 cm left mesenteric node (series 4/image 134) with mild hypermetabolism (max SUV 3.3), increased from 4.1 x 3.8 cm with max SUV 2.6 on 02/24/2015. There is mild hypermetabolism within a 0.8 cm left central mesenteric node (series 4/image 118) with max SUV 3.8, which is increased in metabolism (previous max SUV 1.9) and decreased in size from 1.3 cm on 02/24/2015. The remaining left mesenteric lymph nodes have decreased in size and are non hypermetabolic. No abnormal hypermetabolic activity within the liver, pancreas, adrenal glands, or spleen. Small hiatal hernia. Status post cholecystectomy. Moderate distal colonic diverticulosis. Large fat containing right inguinal hernia. Stable marked prostatomegaly and diffuse bladder wall thickening. Large 5.9 cm right posterior bladder wall diverticulum, previously 4.0 cm. Moderate to severe right hydroureteronephrosis, slightly increased. Mild left hydroureteronephrosis, stable. Nonobstructing 3 mm stone in the upper left kidney. Atherosclerotic nonaneurysmal abdominal aorta. Stable moderate fat containing supraumbilical ventral abdominal hernia. SKELETON No focal hypermetabolic activity to suggest skeletal metastasis. IMPRESSION: 1. Mild interval growth and increased metabolism within the dominant 4.8 cm mildly hypermetabolic left mesenteric node. Newly mildly hypermetabolic 0.8 cm central left mesenteric node. 2. Otherwise continued decreased size of mesenteric adenopathy, which is otherwise non hypermetabolic. 3. No hypermetabolic disease in the neck or chest. 4. Moderate to severe  right hydroureteronephrosis, slightly increased. Mild left hydroureteronephrosis, stable. Stable diffuse bladder wall thickening. Large right posterior bladder wall diverticulum, increased. Marked prostatomegaly. These findings are all likely secondary to chronic bladder outlet obstruction. 5. Stable findings suggestive of chronic / recurrent aspiration pneumonitis in the dependent right greater than left lungs. 6. Small hiatal hernia with stable circumferential wall thickening in the mid to lower thoracic esophagus without hypermetabolism, suggesting chronic esophagitis. Additional chronic findings as above. Electronically Signed   By: Ilona Sorrel M.D.   On: 11/29/2015 13:52     ASSESSMENT & PLAN:  Lymphoma, small lymphocytic  The most recent PET CT scan is reviewed with great detail. Overall, I felt that it represented more of stable disease rather than significant disease progression. The size of the mass of the abdominal mass have changed a little bit with very mild activity. The  adjacent lymph node which seems a little bit more active has reduced in size significantly. Overall, I would really as stable disease. I recommend we continue on Ibrutinib and repeat another history, physical examination, blood work and imaging study in 3 months.  Thrombocytopenia ([place]) This is likely due to recent treatment. The patient denies recent history of bleeding such as epistaxis, hematuria or hematochezia. He is asymptomatic from the low platelet count. I will observe for now.  he does not require transfusion now. I will continue the chemotherapy at current dose without dosage adjustment.  If the thrombocytopenia gets progressive worse in the future, I might have to delay his treatment or adjust the chemotherapy dose.    Chronic kidney disease (CKD), stage III (moderate) He has chronic kidney disease  Since his last visit, his kidney function is stable  I am very concerned about the worsening  hydronephrosis and will consult urologist for further management.    Hydronephrosis, right He has significant hydronephrosis I am concerned that it will compromise his kidney function. I recommend urology consultation again and he agreed   Orders Placed This Encounter  Procedures  . NM PET Image Restag (PS) Skull Base To Thigh    Standing Status: Future     Number of Occurrences:      Standing Expiration Date: 01/29/2017    Order Specific Question:  Reason for Exam (SYMPTOM  OR DIAGNOSIS REQUIRED)    Answer:  SLL/lymphoma staging, assess response to Rx    Order Specific Question:  Preferred imaging location?    Answer:  [facility]  . CBC with Differential/Platelet    Standing Status: Future     Number of Occurrences:      Standing Expiration Date: 01/29/2017  . Comprehensive metabolic panel    Standing Status: Future     Number of Occurrences:      Standing Expiration Date: 01/29/2017   All questions were answered. The patient knows to call the clinic with any problems, questions or concerns. No barriers to learning was detected. I spent 30 minutes counseling the patient face to face. The total time spent in the appointment was 40 minutes and more than 50% was on counseling and review of test results     [facility], [place], MD 11/30/2015 11:56 AM

## 2015-11-30 NOTE — Assessment & Plan Note (Signed)
This is likely due to recent treatment. The patient denies recent history of bleeding such as epistaxis, hematuria or hematochezia. He is asymptomatic from the low platelet count. I will observe for now.  he does not require transfusion now. I will continue the chemotherapy at current dose without dosage adjustment.  If the thrombocytopenia gets progressive worse in the future, I might have to delay his treatment or adjust the chemotherapy dose.   

## 2015-12-28 ENCOUNTER — Encounter: Payer: Self-pay | Admitting: *Deleted

## 2016-01-15 ENCOUNTER — Telehealth: Payer: Self-pay | Admitting: *Deleted

## 2016-01-15 NOTE — Telephone Encounter (Signed)
I will get help from [place] to check

## 2016-01-15 NOTE — Telephone Encounter (Signed)
TC from patient requesting refill on his Imbruvica. He has filled out 'form' and sent back to specialty pharmacy. Pharmacy waiting for authorization for MD

## 2016-01-22 ENCOUNTER — Telehealth: Payer: Self-pay | Admitting: *Deleted

## 2016-01-22 NOTE — Telephone Encounter (Signed)
LVM for pt asking him if he received his refill on Ibrutinib yet.  Asked him to call nurse back to let us know.   S/w Montel Clock who says pt has grant through [employer] and the paperwork has been completed for the refill.  Pt should be getting delivery directly from [place] [facility] [place].

## 2016-01-25 ENCOUNTER — Telehealth: Payer: Self-pay | Admitting: *Deleted

## 2016-01-25 NOTE — Telephone Encounter (Signed)
I'm having problems with these companies for my Imbruvica medication.  I filled out forms sent to [place] who report sending forms to Pharmacy.  The [facility]  [phone]) has not received the forms.  I need refill by Saturday to continue Imbruvica on Sunday.  Will notify Pharmacist.

## 2016-01-26 NOTE — Telephone Encounter (Signed)
01/26/16: Spoke with Wynetta Emery and [employer] this morning. Patient was approved for free Imbruvica through the manufacture on Tuesday 01/23/16. The Rx information has been sent to their pharmacy [facility] pharmacy) and will be processing to ship to patient. Patient may have received a call from them already and not reconized the number. I will call patient today with the information. Pharmacy # for [facility] is [phone].   Thank you,  Montel Clock, PharmD, [facility]

## 2016-02-28 ENCOUNTER — Other Ambulatory Visit: Payer: Self-pay | Admitting: Hematology and Oncology

## 2016-02-28 ENCOUNTER — Telehealth: Payer: Self-pay | Admitting: *Deleted

## 2016-02-28 ENCOUNTER — Other Ambulatory Visit (HOSPITAL_BASED_OUTPATIENT_CLINIC_OR_DEPARTMENT_OTHER): Payer: Medicare Other

## 2016-02-28 DIAGNOSIS — C8300 Small cell B-cell lymphoma, unspecified site: Secondary | ICD-10-CM | POA: Diagnosis present

## 2016-02-28 LAB — CBC WITH DIFFERENTIAL/PLATELET
BASO%: 0.2 % (ref 0.0–2.0)
Basophils Absolute: 0 10*3/uL (ref 0.0–0.1)
EOS%: 0.2 % (ref 0.0–7.0)
Eosinophils Absolute: 0 10*3/uL (ref 0.0–0.5)
HCT: 43.3 % (ref 38.4–49.9)
HGB: 14.1 g/dL (ref 13.0–17.1)
LYMPH%: 25.5 % (ref 14.0–49.0)
MCH: 29.3 pg (ref 27.2–33.4)
MCHC: 32.6 g/dL (ref 32.0–36.0)
MCV: 90 fL (ref 79.3–98.0)
MONO#: 0.9 10*3/uL (ref 0.1–0.9)
MONO%: 9.5 % (ref 0.0–14.0)
NEUT%: 64.6 % (ref 39.0–75.0)
NEUTROS ABS: 5.9 10*3/uL (ref 1.5–6.5)
Platelets: 139 10*3/uL — ABNORMAL LOW (ref 140–400)
RBC: 4.81 10*6/uL (ref 4.20–5.82)
RDW: 16.4 % — AB (ref 11.0–14.6)
WBC: 9.1 10*3/uL (ref 4.0–10.3)
lymph#: 2.3 10*3/uL (ref 0.9–3.3)

## 2016-02-28 LAB — COMPREHENSIVE METABOLIC PANEL
ALK PHOS: 214 U/L — AB (ref 40–150)
ALT: 44 U/L (ref 0–55)
AST: 30 U/L (ref 5–34)
Albumin: 2.9 g/dL — ABNORMAL LOW (ref 3.5–5.0)
Anion Gap: 11 mEq/L (ref 3–11)
BILIRUBIN TOTAL: 0.98 mg/dL (ref 0.20–1.20)
BUN: 41.9 mg/dL — AB (ref 7.0–26.0)
CO2: 25 meq/L (ref 22–29)
Calcium: 9.2 mg/dL (ref 8.4–10.4)
Chloride: 102 mEq/L (ref 98–109)
Creatinine: 1.8 mg/dL — ABNORMAL HIGH (ref 0.7–1.3)
EGFR: 31 mL/min/{1.73_m2} — ABNORMAL LOW (ref >90)
GLUCOSE: 91 mg/dL (ref 70–140)
POTASSIUM: 3.8 meq/L (ref 3.5–5.1)
SODIUM: 138 meq/L (ref 136–145)
TOTAL PROTEIN: 6.1 g/dL — AB (ref 6.4–8.3)

## 2016-02-28 NOTE — Telephone Encounter (Signed)
PET scheduled for 03/05/16 @ 1330 [facility]  Dr Alvy Bimler moved from 3/23 to 3/31 @ 1430, labs at 1400.  Pt given instructions for appts.  Instructed to drink more fluids and hold lasix (until he sees Dr Alvy Bimler) due to lab results from today. Verbalized understanding

## 2016-02-29 ENCOUNTER — Ambulatory Visit: Payer: Medicare Other | Admitting: Hematology and Oncology

## 2016-03-05 ENCOUNTER — Telehealth: Payer: Self-pay | Admitting: Hematology and Oncology

## 2016-03-05 ENCOUNTER — Ambulatory Visit (HOSPITAL_COMMUNITY)
Admission: RE | Admit: 2016-03-05 | Discharge: 2016-03-05 | Disposition: A | Discharge status: Home / Self Care | Payer: Medicare Other | Source: Ambulatory Visit | Attending: Hematology and Oncology | Admitting: Hematology and Oncology

## 2016-03-05 ENCOUNTER — Encounter (HOSPITAL_COMMUNITY): Payer: Medicare Other

## 2016-03-05 DIAGNOSIS — R1909 Other intra-abdominal and pelvic swelling, mass and lump: Secondary | ICD-10-CM | POA: Diagnosis not present

## 2016-03-05 DIAGNOSIS — R911 Solitary pulmonary nodule: Secondary | ICD-10-CM | POA: Diagnosis not present

## 2016-03-05 DIAGNOSIS — E278 Other specified disorders of adrenal gland: Secondary | ICD-10-CM | POA: Diagnosis not present

## 2016-03-05 DIAGNOSIS — R590 Localized enlarged lymph nodes: Secondary | ICD-10-CM | POA: Insufficient documentation

## 2016-03-05 DIAGNOSIS — N1330 Unspecified hydronephrosis: Secondary | ICD-10-CM | POA: Insufficient documentation

## 2016-03-05 DIAGNOSIS — C8300 Small cell B-cell lymphoma, unspecified site: Secondary | ICD-10-CM | POA: Insufficient documentation

## 2016-03-05 LAB — GLUCOSE, CAPILLARY: Glucose-Capillary: 90 mg/dL (ref 65–99)

## 2016-03-05 MED ORDER — FLUDEOXYGLUCOSE F - 18 (FDG) INJECTION: 7.2000 | Freq: Once | INTRAVENOUS | Status: DC | PRN

## 2016-03-05 NOTE — Telephone Encounter (Signed)
s.w. pt and advised on 3.31 appt moved to 3.30 due to md request....pt ok and aware

## 2016-03-06 ENCOUNTER — Other Ambulatory Visit: Payer: Self-pay | Admitting: Hematology and Oncology

## 2016-03-06 ENCOUNTER — Telehealth: Payer: Self-pay | Admitting: *Deleted

## 2016-03-06 DIAGNOSIS — N183 Chronic kidney disease, stage 3 unspecified: Secondary | ICD-10-CM

## 2016-03-06 DIAGNOSIS — N1330 Unspecified hydronephrosis: Secondary | ICD-10-CM

## 2016-03-06 NOTE — Telephone Encounter (Signed)
Pt LVM states he got a call from [facility] says he needs to call the laboratory but can't get through to anyone at the Lab.  Called pt back and informed I can't see any reason why he needs to call the lab.  Confirmed his appt for tomorrow lab at 2 pm and Dr. Alvy Bimler at 2:30 pm.  He verbalized understanding.  He reports no urine output from his "Kidney bag" for the past two days.  He thinks he is urinating "normally." Informed pt this may be a good sign but may also mean there is a blockage.  Instructed him to cal his Urologist now to report.  He verbalized understanding.

## 2016-03-06 NOTE — Telephone Encounter (Signed)
His serum creatinine is higher than baseline I have placed POF and order stat BMP tomorrow before appt

## 2016-03-07 ENCOUNTER — Other Ambulatory Visit (HOSPITAL_BASED_OUTPATIENT_CLINIC_OR_DEPARTMENT_OTHER): Payer: Medicare Other

## 2016-03-07 ENCOUNTER — Telehealth: Payer: Self-pay | Admitting: Hematology and Oncology

## 2016-03-07 ENCOUNTER — Encounter: Payer: Self-pay | Admitting: Hematology and Oncology

## 2016-03-07 ENCOUNTER — Ambulatory Visit (HOSPITAL_BASED_OUTPATIENT_CLINIC_OR_DEPARTMENT_OTHER): Payer: Medicare Other | Admitting: Hematology and Oncology

## 2016-03-07 VITALS — BP 124/61 | HR 84 | Temp 98.0°F | Resp 18 | Ht 66.0 in | Wt 133.4 lb

## 2016-03-07 DIAGNOSIS — N183 Chronic kidney disease, stage 3 unspecified: Secondary | ICD-10-CM

## 2016-03-07 DIAGNOSIS — D696 Thrombocytopenia, unspecified: Secondary | ICD-10-CM

## 2016-03-07 DIAGNOSIS — E440 Moderate protein-calorie malnutrition: Secondary | ICD-10-CM | POA: Diagnosis not present

## 2016-03-07 DIAGNOSIS — N1330 Unspecified hydronephrosis: Secondary | ICD-10-CM

## 2016-03-07 DIAGNOSIS — C8300 Small cell B-cell lymphoma, unspecified site: Secondary | ICD-10-CM

## 2016-03-07 DIAGNOSIS — R911 Solitary pulmonary nodule: Secondary | ICD-10-CM | POA: Diagnosis not present

## 2016-03-07 LAB — BASIC METABOLIC PANEL
Anion Gap: 7 mEq/L (ref 3–11)
BUN: 37.6 mg/dL — AB (ref 7.0–26.0)
CHLORIDE: 106 meq/L (ref 98–109)
CO2: 25 mEq/L (ref 22–29)
Calcium: 8.7 mg/dL (ref 8.4–10.4)
Creatinine: 1.8 mg/dL — ABNORMAL HIGH (ref 0.7–1.3)
EGFR: 32 mL/min/{1.73_m2} — ABNORMAL LOW (ref >90)
Glucose: 94 mg/dl (ref 70–140)
Potassium: 4.1 mEq/L (ref 3.5–5.1)
SODIUM: 137 meq/L (ref 136–145)

## 2016-03-07 NOTE — Progress Notes (Signed)
Daniel [name] OFFICE PROGRESS NOTE  Patient Care Team: [name], MD as PCP - General (Internal Medicine) Franchot Gallo, MD as Consulting Physician (Urology)  SUMMARY OF ONCOLOGIC HISTORY:   Lymphoma, small lymphocytic ([place])   11/10/2011 Initial Diagnosis Lymphoma, small lymphocytic   11/09/2014 Pathology Results Ct guided biopsy of abdominal mass was positive for SLL without signs of transformation   11/28/2014 -  Chemotherapy The patient is started on Ibrutinib   02/24/2015 Imaging Repeat PET/CT scan show greater than 50% reduction in the tumor.   11/29/2015 Imaging PET scan showed persistent mass in the abdomen with mild change in size and activity and worsening hydronephrosis   03/05/2016 Imaging PET CT scan showed improvement in size of mass. Left lower lobe nodule is new, favor infection    INTERVAL HISTORY: Please see below for problem oriented charting. Daniel [name] returns for further follow-up. Daniel [name] had recent placement of Foley catheter and it was not draining well. Daniel [name] follows with urologist closely. Daniel [name] has lost some weight due to irregular eating habits. Daniel [name] denies side effects from [place] such as bleeding or diarrhea. No new lymphadenopathy. No recent infection.  REVIEW OF SYSTEMS:   Constitutional: Denies fevers, chills  Eyes: Denies blurriness of vision Ears, nose, mouth, throat, and face: Denies mucositis or sore throat Respiratory: Denies cough, dyspnea or wheezes Cardiovascular: Denies palpitation, chest discomfort or lower extremity swelling Gastrointestinal:  Denies nausea, heartburn or change in bowel habits Skin: Denies abnormal skin rashes Lymphatics: Denies new lymphadenopathy or easy bruising Neurological:Denies numbness, tingling or new weaknesses Behavioral/Psych: Mood is stable, no new changes  All other systems were reviewed with the patient and are negative.  I have reviewed the past medical history, past surgical history, social history and  family history with the patient and they are unchanged from previous note.  ALLERGIES:  has No Known Allergies.  MEDICATIONS:  Current Outpatient Prescriptions  Medication Sig Dispense Refill  . finasteride (PROSCAR) 5 MG tablet Take 5 mg by mouth daily with breakfast.     . fish oil-omega-3 fatty acids 1000 MG capsule Take 1 g by mouth daily with breakfast.     . furosemide (LASIX) 20 MG tablet Take 20 mg by mouth every morning.     Marland Kitchen glucosamine-chondroitin 500-400 MG tablet Take 1 tablet by mouth daily.     . Hypromellose (GENTEAL OP) Place 1 drop into both eyes 2 (two) times daily as needed. Dry eyes    . ibrutinib (IMBRUVICA) 140 MG capsul Take 3 capsules (420 mg total) by mouth daily. 90 capsule 0  . lansoprazole (PREVACID) 30 MG capsule Take 30 mg by mouth daily.      . Multiple Vitamins-Minerals (ICAPS MV PO) Take 1 tablet by mouth daily.     No current facility-administered medications for this visit.   Facility-Administered Medications Ordered in Other Visits  Medication Dose Route Frequency Provider Last Rate Last Dose  . fludeoxyglucose F - 18 (FDG) injection 7.2 milli Curie  7.2 milli Curie Intravenous Once PRN Heath Lark, MD        PHYSICAL EXAMINATION: ECOG PERFORMANCE STATUS: 1 - Symptomatic but completely ambulatory  Filed Vitals:   03/07/16 1403  BP: 124/61  Pulse: 84  Temp: 98 F (36.7 C)  Resp: 18   Filed Weights   03/07/16 1403  Weight: 133 lb 6.4 oz (60.51 kg)    GENERAL:alert, no distress and comfortable. Daniel [name] looks thinner SKIN: skin color, texture, turgor are normal, no rashes or significant lesions  EYES: normal, Conjunctiva are pink and non-injected, sclera clear Musculoskeletal:no cyanosis of digits and no clubbing  NEURO: alert & oriented x 3 with fluent speech, no focal motor/sensory deficits  LABORATORY DATA:  I have reviewed the data as listed    Component Value Date/Time   NA 137 03/07/2016 1351   NA 139 05/27/2013 1338   NA 142  05/12/2012 0739   K 4.1 03/07/2016 1351   K 4.6 05/27/2013 1338   K 4.5 05/12/2012 0739   CL 104 05/27/2013 1338   CL 103 04/19/2013 0828   CL 100 05/12/2012 0739   CO2 25 03/07/2016 1351   CO2 30 05/27/2013 1338   CO2 30 05/12/2012 0739   GLUCOSE 94 03/07/2016 1351   GLUCOSE 96 05/27/2013 1338   GLUCOSE 101* 04/19/2013 0828   GLUCOSE 122* 05/12/2012 0739   BUN 37.6* 03/07/2016 1351   BUN 19 05/27/2013 1338   BUN 24* 05/12/2012 0739   CREATININE 1.8* 03/07/2016 1351   CREATININE 1.13 05/27/2013 1338   CREATININE 1.6* 05/12/2012 0739   CALCIUM 8.7 03/07/2016 1351   CALCIUM 9.4 05/27/2013 1338   CALCIUM 8.7 05/12/2012 0739   PROT 6.1* 02/28/2016 1011   PROT 5.7* 05/27/2013 1338   PROT 6.3* 05/12/2012 0739   ALBUMIN 2.9* 02/28/2016 1011   ALBUMIN 3.2* 05/27/2013 1338   ALBUMIN 3.0* 05/12/2012 0739   AST 30 02/28/2016 1011   AST 195* 05/27/2013 1338   AST 22 05/12/2012 0739   ALT 44 02/28/2016 1011   ALT 174* 05/27/2013 1338   ALT 27 05/12/2012 0739   ALKPHOS 214* 02/28/2016 1011   ALKPHOS 151* 05/27/2013 1338   ALKPHOS 109* 05/12/2012 0739   BILITOT 0.98 02/28/2016 1011   BILITOT 0.6 05/27/2013 1338   BILITOT 0.80 05/12/2012 0739   GFRNONAA 55* 05/27/2013 1338   GFRAA 64* 05/27/2013 1338    No results found for: SPEP, UPEP  Lab Results  Component Value Date   WBC 9.1 02/28/2016   NEUTROABS 5.9 02/28/2016   HGB 14.1 02/28/2016   HCT 43.3 02/28/2016   MCV 90.0 02/28/2016   PLT 139* 02/28/2016      Chemistry      Component Value Date/Time   NA 137 03/07/2016 1351   NA 139 05/27/2013 1338   NA 142 05/12/2012 0739   K 4.1 03/07/2016 1351   K 4.6 05/27/2013 1338   K 4.5 05/12/2012 0739   CL 104 05/27/2013 1338   CL 103 04/19/2013 0828   CL 100 05/12/2012 0739   CO2 25 03/07/2016 1351   CO2 30 05/27/2013 1338   CO2 30 05/12/2012 0739   BUN 37.6* 03/07/2016 1351   BUN 19 05/27/2013 1338   BUN 24* 05/12/2012 0739   CREATININE 1.8* 03/07/2016 1351    CREATININE 1.13 05/27/2013 1338   CREATININE 1.6* 05/12/2012 0739      Component Value Date/Time   CALCIUM 8.7 03/07/2016 1351   CALCIUM 9.4 05/27/2013 1338   CALCIUM 8.7 05/12/2012 0739   ALKPHOS 214* 02/28/2016 1011   ALKPHOS 151* 05/27/2013 1338   ALKPHOS 109* 05/12/2012 0739   AST 30 02/28/2016 1011   AST 195* 05/27/2013 1338   AST 22 05/12/2012 0739   ALT 44 02/28/2016 1011   ALT 174* 05/27/2013 1338   ALT 27 05/12/2012 0739   BILITOT 0.98 02/28/2016 1011   BILITOT 0.6 05/27/2013 1338   BILITOT 0.80 05/12/2012 0739       RADIOGRAPHIC STUDIES:I reviewed the recent PET CT scan with  him and his son I have personally reviewed the radiological images as listed and agreed with the findings in the report.    ASSESSMENT & PLAN:   Lymphoma, small lymphocytic PET CT scan showed stable appearance. I will continue Iburtinib without dosage adjustment for now. I will see him back in 3 months for further assessment.  Hydronephrosis, right Daniel [name] has significant hydronephrosis I am concerned that it will compromise his kidney function.  Daniel [name] is currently followed by urologist and had placement of Foley catheter. I would defer to them for further management.    Thrombocytopenia ([place]) This is likely due to recent treatment. The patient denies recent history of bleeding such as epistaxis, hematuria or hematochezia. Daniel [name] is asymptomatic from the low platelet count. I will observe for now.  Daniel [name] does not require transfusion now. I will continue the chemotherapy at current dose without dosage adjustment.  If the thrombocytopenia gets progressive worse in the future, I might have to delay his treatment or adjust the chemotherapy dose.    Chronic kidney disease (CKD), stage III (moderate) Daniel [name] has chronic kidney disease, related to aging and hydronephrosis Since his last visit, his kidney function is a little worse Daniel [name] will continue close follow-up with urologist as above.  Lesion of left  lung The appearance is likely related to infection/inflammation. The patient has chronic reflux and I suspect that Daniel [name] has probably mild aspiration causing inflammatory change in the left lower lobe. I recommend elevation of the head of the bed to prevent risk of aspiration when Daniel [name] is asleep. I will follow with imaging study at the end of the year  Protein-calorie malnutrition, moderate ([place]) Daniel [name] has lost some weight and I do not believe it is due to cancer progression, rather due to mild loss of appetite and irregular eating habits. We gave him some nutritional supplement samples and I recommend Daniel [name] increase oral intake as tolerated and to take nutritional supplement 2-3 times a day in addition to regular food.     All questions were answered. The patient knows to call the clinic with any problems, questions or concerns. No barriers to learning was detected. I spent 25 minutes counseling the patient face to face. The total time spent in the appointment was 30 minutes and more than 50% was on counseling and review of test results     [facility], [place], MD 03/07/2016 2:32 PM

## 2016-03-07 NOTE — Assessment & Plan Note (Signed)
He has chronic kidney disease, related to aging and hydronephrosis Since his last visit, his kidney function is a little worse He will continue close follow-up with urologist as above.

## 2016-03-07 NOTE — Telephone Encounter (Signed)
per pof to sch pt appt-gave pt copy of avs °

## 2016-03-07 NOTE — Assessment & Plan Note (Signed)
He has significant hydronephrosis I am concerned that it will compromise his kidney function.  He is currently followed by urologist and had placement of Foley catheter. I would defer to them for further management.

## 2016-03-07 NOTE — Assessment & Plan Note (Signed)
The appearance is likely related to infection/inflammation. The patient has chronic reflux and I suspect that he has probably mild aspiration causing inflammatory change in the left lower lobe. I recommend elevation of the head of the bed to prevent risk of aspiration when he is asleep. I will follow with imaging study at the end of the year

## 2016-03-07 NOTE — Assessment & Plan Note (Signed)
This is likely due to recent treatment. The patient denies recent history of bleeding such as epistaxis, hematuria or hematochezia. He is asymptomatic from the low platelet count. I will observe for now.  he does not require transfusion now. I will continue the chemotherapy at current dose without dosage adjustment.  If the thrombocytopenia gets progressive worse in the future, I might have to delay his treatment or adjust the chemotherapy dose.   

## 2016-03-07 NOTE — Assessment & Plan Note (Signed)
PET CT scan showed stable appearance. I will continue Iburtinib without dosage adjustment for now. I will see him back in 3 months for further assessment.

## 2016-03-07 NOTE — Assessment & Plan Note (Signed)
He has lost some weight and I do not believe it is due to cancer progression, rather due to mild loss of appetite and irregular eating habits. We gave him some nutritional supplement samples and I recommend he increase oral intake as tolerated and to take nutritional supplement 2-3 times a day in addition to regular food.

## 2016-03-08 ENCOUNTER — Ambulatory Visit: Payer: Medicare Other | Admitting: Hematology and Oncology

## 2016-03-08 ENCOUNTER — Other Ambulatory Visit: Payer: Medicare Other

## 2016-03-11 ENCOUNTER — Other Ambulatory Visit: Payer: Self-pay | Admitting: Urology

## 2016-03-11 DIAGNOSIS — N131 Hydronephrosis with ureteral stricture, not elsewhere classified: Secondary | ICD-10-CM

## 2016-03-13 ENCOUNTER — Encounter: Payer: Self-pay | Admitting: Skilled Nursing Facility1

## 2016-03-13 NOTE — Progress Notes (Signed)
Subjective:     Patient ID: Daniel [name], male   DOB: June 25, 1919, 82 y.o.   MRN: [id number]  HPI   Review of Systems     Objective:   Physical Exam To assist the pt in identifying dietary strategies to gain lost wt.    Assessment:     Pt identified as being malnourished due to lost wt. Pt was contacted via the telephone at [phone], pt was unavailable.    Plan:     Dietitian left a message prompting to call Ernestene Kiel RD,LDN, [place] at [phone].

## 2016-03-14 ENCOUNTER — Inpatient Hospital Stay (HOSPITAL_COMMUNITY)
Admission: AD | Admit: 2016-03-14 | Discharge: 2016-03-19 | DRG: 193 | Disposition: A | Discharge status: Skilled Nursing Facility | Payer: Medicare Other | Source: Ambulatory Visit | Attending: Internal Medicine | Admitting: Internal Medicine

## 2016-03-14 ENCOUNTER — Inpatient Hospital Stay (HOSPITAL_COMMUNITY): Payer: Medicare Other

## 2016-03-14 ENCOUNTER — Encounter (HOSPITAL_COMMUNITY): Payer: Self-pay

## 2016-03-14 DIAGNOSIS — J9601 Acute respiratory failure with hypoxia: Secondary | ICD-10-CM | POA: Diagnosis present

## 2016-03-14 DIAGNOSIS — N1330 Unspecified hydronephrosis: Secondary | ICD-10-CM | POA: Diagnosis present

## 2016-03-14 DIAGNOSIS — N131 Hydronephrosis with ureteral stricture, not elsewhere classified: Secondary | ICD-10-CM

## 2016-03-14 DIAGNOSIS — Z6820 Body mass index (BMI) 20.0-20.9, adult: Secondary | ICD-10-CM | POA: Diagnosis not present

## 2016-03-14 DIAGNOSIS — C8590 Non-Hodgkin lymphoma, unspecified, unspecified site: Secondary | ICD-10-CM | POA: Diagnosis present

## 2016-03-14 DIAGNOSIS — D696 Thrombocytopenia, unspecified: Secondary | ICD-10-CM | POA: Diagnosis present

## 2016-03-14 DIAGNOSIS — N183 Chronic kidney disease, stage 3 (moderate): Secondary | ICD-10-CM | POA: Diagnosis present

## 2016-03-14 DIAGNOSIS — E43 Unspecified severe protein-calorie malnutrition: Secondary | ICD-10-CM | POA: Diagnosis present

## 2016-03-14 DIAGNOSIS — Z87891 Personal history of nicotine dependence: Secondary | ICD-10-CM | POA: Diagnosis not present

## 2016-03-14 DIAGNOSIS — D61818 Other pancytopenia: Secondary | ICD-10-CM | POA: Diagnosis present

## 2016-03-14 DIAGNOSIS — N401 Enlarged prostate with lower urinary tract symptoms: Secondary | ICD-10-CM | POA: Diagnosis present

## 2016-03-14 DIAGNOSIS — K219 Gastro-esophageal reflux disease without esophagitis: Secondary | ICD-10-CM | POA: Diagnosis present

## 2016-03-14 DIAGNOSIS — N138 Other obstructive and reflux uropathy: Secondary | ICD-10-CM | POA: Diagnosis present

## 2016-03-14 DIAGNOSIS — H9190 Unspecified hearing loss, unspecified ear: Secondary | ICD-10-CM | POA: Diagnosis present

## 2016-03-14 DIAGNOSIS — R0902 Hypoxemia: Secondary | ICD-10-CM | POA: Diagnosis present

## 2016-03-14 DIAGNOSIS — Z87442 Personal history of urinary calculi: Secondary | ICD-10-CM | POA: Diagnosis not present

## 2016-03-14 DIAGNOSIS — J189 Pneumonia, unspecified organism: Principal | ICD-10-CM | POA: Diagnosis present

## 2016-03-14 DIAGNOSIS — N179 Acute kidney failure, unspecified: Secondary | ICD-10-CM | POA: Diagnosis present

## 2016-03-14 DIAGNOSIS — R000 Tachycardia, unspecified: Secondary | ICD-10-CM | POA: Diagnosis present

## 2016-03-14 DIAGNOSIS — E440 Moderate protein-calorie malnutrition: Secondary | ICD-10-CM | POA: Diagnosis present

## 2016-03-14 DIAGNOSIS — C8300 Small cell B-cell lymphoma, unspecified site: Secondary | ICD-10-CM | POA: Diagnosis present

## 2016-03-14 DIAGNOSIS — J9600 Acute respiratory failure, unspecified whether with hypoxia or hypercapnia: Secondary | ICD-10-CM

## 2016-03-14 LAB — CBC WITH DIFFERENTIAL/PLATELET
BASOS PCT: 0 %
Basophils Absolute: 0 10*3/uL (ref 0.0–0.1)
EOS ABS: 0 10*3/uL (ref 0.0–0.7)
EOS PCT: 0 %
HEMATOCRIT: 33.5 % — AB (ref 39.0–52.0)
HEMOGLOBIN: 10.9 g/dL — AB (ref 13.0–17.0)
LYMPHS PCT: 38 %
Lymphs Abs: 1.3 10*3/uL (ref 0.7–4.0)
MCH: 28.2 pg (ref 26.0–34.0)
MCHC: 32.5 g/dL (ref 30.0–36.0)
MCV: 86.6 fL (ref 78.0–100.0)
MONOS PCT: 13 %
Monocytes Absolute: 0.5 10*3/uL (ref 0.1–1.0)
NEUTROS PCT: 49 %
Neutro Abs: 1.7 10*3/uL (ref 1.7–7.7)
Platelets: 99 10*3/uL — ABNORMAL LOW (ref 150–400)
RBC: 3.87 MIL/uL — ABNORMAL LOW (ref 4.22–5.81)
RDW: 16.4 % — ABNORMAL HIGH (ref 11.5–15.5)
WBC: 3.5 10*3/uL — ABNORMAL LOW (ref 4.0–10.5)

## 2016-03-14 LAB — D-DIMER, QUANTITATIVE (NOT AT ARMC): D DIMER QUANT: 1.45 ug{FEU}/mL — AB (ref 0.00–0.50)

## 2016-03-14 LAB — COMPREHENSIVE METABOLIC PANEL
ALT: 24 U/L (ref 17–63)
ANION GAP: 9 (ref 5–15)
AST: 37 U/L (ref 15–41)
Albumin: 2.4 g/dL — ABNORMAL LOW (ref 3.5–5.0)
Alkaline Phosphatase: 135 U/L — ABNORMAL HIGH (ref 38–126)
BUN: 50 mg/dL — ABNORMAL HIGH (ref 6–20)
CALCIUM: 8.1 mg/dL — AB (ref 8.9–10.3)
CHLORIDE: 104 mmol/L (ref 101–111)
CO2: 22 mmol/L (ref 22–32)
Creatinine, Ser: 1.58 mg/dL — ABNORMAL HIGH (ref 0.61–1.24)
GFR calc non Af Amer: 36 mL/min — ABNORMAL LOW (ref >60)
GFR, EST AFRICAN AMERICAN: 41 mL/min — AB (ref >60)
Glucose, Bld: 115 mg/dL — ABNORMAL HIGH (ref 65–99)
POTASSIUM: 4.7 mmol/L (ref 3.5–5.1)
SODIUM: 135 mmol/L (ref 135–145)
Total Bilirubin: 0.7 mg/dL (ref 0.3–1.2)
Total Protein: 4.7 g/dL — ABNORMAL LOW (ref 6.5–8.1)

## 2016-03-14 LAB — TROPONIN I: Troponin I: 0.04 ng/mL — ABNORMAL HIGH (ref <0.031)

## 2016-03-14 LAB — TSH: TSH: 0.589 u[IU]/mL (ref 0.350–4.500)

## 2016-03-14 LAB — APTT: aPTT: 29 seconds (ref 24–37)

## 2016-03-14 LAB — PROTIME-INR
INR: 1.16 (ref 0.00–1.49)
PROTHROMBIN TIME: 14.5 s (ref 11.6–15.2)

## 2016-03-14 MED ORDER — ALBUTEROL SULFATE (2.5 MG/3ML) 0.083% IN NEBU
2.5000 mg | INHALATION_SOLUTION | Freq: Four times a day (QID) | RESPIRATORY_TRACT | Status: DC
  Administered 2016-03-14: 2.5 mg via RESPIRATORY_TRACT
  Filled 2016-03-14: qty 3

## 2016-03-14 MED ORDER — ACETAMINOPHEN 650 MG RE SUPP: 650.0000 mg | Freq: Four times a day (QID) | RECTAL | Status: DC | PRN

## 2016-03-14 MED ORDER — SODIUM CHLORIDE 0.9% FLUSH
3.0000 mL | Freq: Two times a day (BID) | INTRAVENOUS | Status: DC
  Administered 2016-03-17 – 2016-03-19 (×2): 3 mL via INTRAVENOUS

## 2016-03-14 MED ORDER — ENOXAPARIN SODIUM 30 MG/0.3ML ~~LOC~~ SOLN
30.0000 mg | SUBCUTANEOUS | Status: DC
  Administered 2016-03-14 – 2016-03-17 (×4): 30 mg via SUBCUTANEOUS
  Filled 2016-03-14 (×5): qty 0.3

## 2016-03-14 MED ORDER — FINASTERIDE 5 MG PO TABS
5.0000 mg | ORAL_TABLET | Freq: Every day | ORAL | Status: DC
  Administered 2016-03-15 – 2016-03-19 (×5): 5 mg via ORAL
  Filled 2016-03-14 (×5): qty 1

## 2016-03-14 MED ORDER — ALBUTEROL SULFATE (2.5 MG/3ML) 0.083% IN NEBU
2.5000 mg | INHALATION_SOLUTION | Freq: Three times a day (TID) | RESPIRATORY_TRACT | Status: DC
  Administered 2016-03-15 – 2016-03-19 (×14): 2.5 mg via RESPIRATORY_TRACT
  Filled 2016-03-14 (×14): qty 3

## 2016-03-14 MED ORDER — DEXTROSE 5 % IV SOLN
500.0000 mg | INTRAVENOUS | Status: DC
  Administered 2016-03-14 – 2016-03-18 (×5): 500 mg via INTRAVENOUS
  Filled 2016-03-14 (×6): qty 500

## 2016-03-14 MED ORDER — DEXTROSE 5 % IV SOLN
1.0000 g | INTRAVENOUS | Status: DC
  Administered 2016-03-14 – 2016-03-18 (×5): 1 g via INTRAVENOUS
  Filled 2016-03-14 (×6): qty 10

## 2016-03-14 MED ORDER — VANCOMYCIN HCL IN DEXTROSE 1-5 GM/200ML-% IV SOLN
1000.0000 mg | Freq: Once | INTRAVENOUS | Status: AC
  Administered 2016-03-14: 1000 mg via INTRAVENOUS
  Filled 2016-03-14: qty 200

## 2016-03-14 MED ORDER — VANCOMYCIN HCL 500 MG IV SOLR
500.0000 mg | INTRAVENOUS | Status: DC
  Administered 2016-03-15 – 2016-03-18 (×4): 500 mg via INTRAVENOUS
  Filled 2016-03-14 (×5): qty 500

## 2016-03-14 MED ORDER — ENSURE ENLIVE PO LIQD
237.0000 mL | Freq: Two times a day (BID) | ORAL | Status: DC
Start: 2016-03-15 — End: 2016-03-16
  Administered 2016-03-15: 237 mL via ORAL

## 2016-03-14 MED ORDER — IBRUTINIB 140 MG PO CAPS: 420.0000 mg | ORAL_CAPSULE | Freq: Every day | ORAL | Status: DC

## 2016-03-14 MED ORDER — ACETAMINOPHEN 325 MG PO TABS
650.0000 mg | ORAL_TABLET | Freq: Four times a day (QID) | ORAL | Status: DC | PRN
Start: 2016-03-14 — End: 2016-03-19

## 2016-03-14 MED ORDER — SODIUM CHLORIDE 0.9 % IV SOLN
INTRAVENOUS | Status: AC
  Administered 2016-03-14 (×2): via INTRAVENOUS

## 2016-03-14 NOTE — Progress Notes (Signed)
Pharmacy Antibiotic Follow-up Note  Daniel [name] is a 82 y.o. year-old male admitted on 03/14/2016.  The patient is currently on day 1 of Rocephin & Flagyl with added Vancomycin for possible MRSA for PNA. Per med rec: patient had not yet started on Levaquin and Azithromycin prescribed as outpatient  Assessment/Plan: Vancomycin 1gm x1, then 500mg   IV every q24 hours.  Goal trough 15-20 mcg/mL.  Rocephin 1gm q24 Azithromcyin 500mg  IV q24  Temp (24hrs), Avg:98.3 F (36.8 C), Min:98.3 F (36.8 C), Max:98.3 F (36.8 C)  No results for input(s): WBC in the last 168 hours.  Invalid input(s):  CREATININE No results for input(s): CREATININE in the last 168 hours. Estimated Creatinine Clearance: 21.4 mL/min (by C-G formula based on Cr of 1.8).    No Known Allergies  Antimicrobials this admission: 4/6 Rocephin >>  4/6 Azithromycin >>  4/6 Vancomycin >>  Levels/dose changes this admission:  Microbiology results: 4/6 BCx: sent       Sputum: ordered, not collected         Legionella: ordered, not collected  Thank you for allowing pharmacy to be a part of this patient's care.  Minda Ditto PharmD 03/14/2016 5:32 PM

## 2016-03-14 NOTE — Progress Notes (Signed)
Ibrutinib PTA for Lymphoma, last dose 4/6 am  Ibrutinib (Imbruvica) hold criteria: met ANC < 750 Platelet count < 50,000 ( currently 99,000) SCr > 1.5x baseline (or > 2 if baseline unknown) Bili > 1.5x ULN AST or ALT > 3 ULN NYHA Class 3 or 4 Heart failure Active infection Hemorrhage 3-7 days pre- or post-surgery  Will hold Ibrutinib during treatment for PNA. Please consult with Onc MD when appropriate to resume  Thank you ,  Minda Ditto PharmD Pager [phone] 03/14/2016, 10:02 PM

## 2016-03-14 NOTE — H&P (Signed)
Daniel [name] is an 82 y.o. male.   Chief Complaint: hypoxia HPI: 82 yo male with hx of NHL, apparently has been c/o worsening cough,  Yellow sputum.  Denies fever, chills, cp, palp, n/v, lower ext edema.  Pt was started on levaquin yesterday. Pt hypoxic today w pox 80% on RA.  CXR ? Infiltrate.  Pt will be admitted for hypoxia, acute respiratory failure.    Past Medical History  Diagnosis Date  . HOH (hard of hearing)   . Diverticulosis   . BPH (benign prostatic hyperplasia)   . Congenital malformation of spine     T12  . Hx of hydronephrosis   . History of kidney stones 2013  . Cough for last 2 years    occasional white sputum  . Sleep apnea   . Prostate enlargement   . Hydronephrosis 10/27/2014  . NHL (non-Hodgkin's lymphoma) ([place])     nhl dx 5/09;  . Non-Hodgkin lymphoma [facility])     Past Surgical History  Procedure Laterality Date  . Prostate surgery  yrs ago  . Eye surgery  yrs ago    Implants bilateral  . Inguinal hernia repair  yrs ago    left  . Cholecystectomy  yrs ago  . Esophagogastroduodenoscopy (egd) with propofol N/A 07/15/2013    Procedure: ESOPHAGOGASTRODUODENOSCOPY (EGD) WITH PROPOFOL;  Surgeon: Milus Banister, MD;  Location: [facility] ENDOSCOPY;  Service: Endoscopy;  Laterality: N/A;  botox injection  . Botox injection N/A 07/15/2013    Procedure: BOTOX INJECTION;  Surgeon: Milus Banister, MD;  Location: [facility] ENDOSCOPY;  Service: Endoscopy;  Laterality: N/A;    Family History  Problem Relation Age of Onset  . Tuberculosis Mother   . Blindness Father    Social History:  reports that he quit smoking about 56 years ago. His smoking use included Pipe. He has never used smokeless tobacco. He reports that he drinks about 0.6 oz of alcohol per week. He reports that he does not use illicit drugs.  Allergies: No Known Allergies  Medications Prior to Admission  Medication Sig Dispense Refill  . finasteride (PROSCAR) 5 MG tablet Take 5 mg by mouth daily with breakfast.      . fish oil-omega-3 fatty acids 1000 MG capsule Take 1 g by mouth daily with breakfast.     . glucosamine-chondroitin 500-400 MG tablet Take 1 tablet by mouth daily.     Marland Kitchen ibrutinib (IMBRUVICA) 140 MG capsul Take 3 capsules (420 mg total) by mouth daily. 90 capsule 0  . Multiple Vitamins-Minerals (ICAPS MV PO) Take 1 tablet by mouth daily.    Marland Kitchen azithromycin (ZITHROMAX) 250 MG tablet Take 250-500 mg by mouth daily. 5 Day Course. Take 2 tablets (500 mg) on Day 1 and Then Take 1 tablet (250 mg) for Days 2-5.    Marland Kitchen Hypromellose (GENTEAL OP) Place 1 drop into both eyes 2 (two) times daily as needed. Dry eyes    . levofloxacin (LEVAQUIN) 750 MG tablet Take 750 mg by mouth daily. 7 Day Course.      Results for orders placed or performed during the hospital encounter of 03/14/16 (from the past 48 hour(s))  Comprehensive metabolic panel     Status: Abnormal   Collection Time: 03/14/16  5:33 PM  Result Value Ref Range   Sodium 135 135 - 145 mmol/L   Potassium 4.7 3.5 - 5.1 mmol/L   Chloride 104 101 - 111 mmol/L   CO2 22 22 - 32 mmol/L   Glucose, Bld  115 (H) 65 - 99 mg/dL   BUN 50 (H) 6 - 20 mg/dL   Creatinine, Ser 1.58 (H) 0.61 - 1.24 mg/dL   Calcium 8.1 (L) 8.9 - 10.3 mg/dL   Total Protein 4.7 (L) 6.5 - 8.1 g/dL   Albumin 2.4 (L) 3.5 - 5.0 g/dL   AST 37 15 - 41 U/L   ALT 24 17 - 63 U/L   Alkaline Phosphatase 135 (H) 38 - 126 U/L   Total Bilirubin 0.7 0.3 - 1.2 mg/dL   GFR calc non Af Amer 36 (L) >60 mL/min   GFR calc Af Amer 41 (L) >60 mL/min    Comment: (NOTE) The eGFR has been calculated using the CKD EPI equation. This calculation has not been validated in all clinical situations. eGFR's persistently <60 mL/min signify possible Chronic Kidney Disease.    Anion gap 9 5 - 15  CBC WITH DIFFERENTIAL     Status: Abnormal   Collection Time: 03/14/16  5:33 PM  Result Value Ref Range   WBC 3.5 (L) 4.0 - 10.5 K/uL   RBC 3.87 (L) 4.22 - 5.81 MIL/uL   Hemoglobin 10.9 (L) 13.0 - 17.0 g/dL    HCT 33.5 (L) 39.0 - 52.0 %   MCV 86.6 78.0 - 100.0 fL   MCH 28.2 26.0 - 34.0 pg   MCHC 32.5 30.0 - 36.0 g/dL   RDW 16.4 (H) 11.5 - 15.5 %   Platelets 99 (L) 150 - 400 K/uL    Comment: PLATELET COUNT CONFIRMED BY SMEAR SPECIMEN CHECKED FOR CLOTS    Neutrophils Relative % 49 %   Lymphocytes Relative 38 %   Monocytes Relative 13 %   Eosinophils Relative 0 %   Basophils Relative 0 %   Neutro Abs 1.7 1.7 - 7.7 K/uL   Lymphs Abs 1.3 0.7 - 4.0 K/uL   Monocytes Absolute 0.5 0.1 - 1.0 K/uL   Eosinophils Absolute 0.0 0.0 - 0.7 K/uL   Basophils Absolute 0.0 0.0 - 0.1 K/uL  APTT     Status: None   Collection Time: 03/14/16  5:33 PM  Result Value Ref Range   aPTT 29 24 - 37 seconds  Protime-INR     Status: None   Collection Time: 03/14/16  5:33 PM  Result Value Ref Range   Prothrombin Time 14.5 11.6 - 15.2 seconds   INR 1.16 0.00 - 1.49   No results found.  Review of Systems  Constitutional: Negative for fever, chills, weight loss, malaise/fatigue and diaphoresis.  HENT: Negative for congestion, ear discharge, ear pain, hearing loss, nosebleeds, sore throat and tinnitus.   Eyes: Negative.   Respiratory: Positive for cough, sputum production and shortness of breath. Negative for hemoptysis, wheezing and stridor.   Cardiovascular: Negative for chest pain, palpitations, orthopnea, claudication, leg swelling and PND.  Gastrointestinal: Negative for heartburn, nausea, vomiting, abdominal pain, diarrhea, constipation, blood in stool and melena.  Genitourinary: Negative for dysuria, urgency, frequency, hematuria and flank pain.  Musculoskeletal: Negative for myalgias, back pain, joint pain, falls and neck pain.  Skin: Negative for itching and rash.  Neurological: Negative for dizziness, tingling, tremors, sensory change, speech change, focal weakness, seizures, loss of consciousness, weakness and headaches.  Endo/Heme/Allergies: Negative for environmental allergies and polydipsia. Does not  bruise/bleed easily.  Psychiatric/Behavioral: Negative for depression, suicidal ideas, hallucinations, memory loss and substance abuse. The patient is not nervous/anxious and does not have insomnia.     Blood pressure 138/61, pulse 112, temperature 98.3 F (36.8 C), temperature  source Oral, resp. rate 22, height '5\' 6"'  (1.676 m), weight 60.4 kg (133 lb 2.5 oz), SpO2 86 %. Physical Exam  Constitutional: He is oriented to person, place, and time. He appears well-developed and well-nourished.  HENT:  Head: Normocephalic and atraumatic.  Eyes: Conjunctivae and EOM are normal. Pupils are equal, round, and reactive to light. Right eye exhibits no discharge. Left eye exhibits no discharge. No scleral icterus.  Neck: Normal range of motion. Neck supple. No JVD present. No tracheal deviation present. No thyromegaly present.  Cardiovascular: Normal rate and regular rhythm.  Exam reveals no gallop and no friction rub.   No murmur heard. Respiratory: He is in respiratory distress. He has no wheezes. He has rales. He exhibits no tenderness.  GI: Soft. Bowel sounds are normal. He exhibits no distension and no mass. There is no tenderness. There is no rebound and no guarding.  Musculoskeletal: Normal range of motion. He exhibits no edema or tenderness.  Lymphadenopathy:    He has no cervical adenopathy.  Neurological: He is alert and oriented to person, place, and time. He has normal reflexes. He displays normal reflexes. No cranial nerve deficit. He exhibits normal muscle tone. Coordination normal.  Skin: Skin is warm and dry. No rash noted. No erythema. No pallor.  Psychiatric: He has a normal mood and affect. His behavior is normal. Judgment and thought content normal.     Assessment/Plan Hypoxia, Acute resp failure Start vanco iv, rocephin iv, zithromax iv Sputum culture, blood culture  Tachycardia Check trop i q6h x3,  Check d dimer , tsh Consider echo.   Renal insufficiency Check cbc,  cmp  Anemia Check cbc   DVT Prophylaxis: lovenox  [name], MD 03/14/2016, 7:59 PM

## 2016-03-15 ENCOUNTER — Inpatient Hospital Stay (HOSPITAL_COMMUNITY): Payer: Medicare Other

## 2016-03-15 DIAGNOSIS — E43 Unspecified severe protein-calorie malnutrition: Secondary | ICD-10-CM | POA: Diagnosis present

## 2016-03-15 LAB — TROPONIN I: Troponin I: 0.03 ng/mL (ref <0.031)

## 2016-03-15 MED ORDER — POLYVINYL ALCOHOL 1.4 % OP SOLN
1.0000 [drp] | Freq: Four times a day (QID) | OPHTHALMIC | Status: DC
  Administered 2016-03-15 – 2016-03-19 (×16): 1 [drp] via OPHTHALMIC
  Filled 2016-03-15: qty 15

## 2016-03-15 MED ORDER — TECHNETIUM TO 99M ALBUMIN AGGREGATED
4.1600 | Freq: Once | INTRAVENOUS | Status: AC | PRN
  Administered 2016-03-15: 4 via INTRAVENOUS

## 2016-03-15 MED ORDER — TECHNETIUM TC 99M DIETHYLENETRIAME-PENTAACETIC ACID: 32.9000 | Freq: Once | INTRAVENOUS | Status: DC | PRN

## 2016-03-15 NOTE — Progress Notes (Signed)
Initial Nutrition Assessment  DOCUMENTATION CODES:   Severe malnutrition in context of social or environmental circumstances  INTERVENTION:  -Monitor diet advancement. -Once diet advanced, Ensure Enlive BID. Each supplement provides 350 kcals and 20 grams of protein. -Continue to monitor nutritional needs.    NUTRITION DIAGNOSIS:   Inadequate oral intake related to poor appetite as evidenced by estimated needs.  GOAL:   Patient will meet greater than or equal to 90% of their needs  MONITOR:   PO intake, Supplement acceptance, Labs, Weight trends, Skin, I & O's  REASON FOR ASSESSMENT:   Malnutrition Screening Tool    ASSESSMENT:   82 yo male with hx of NHL, apparently has been c/o worsening cough, Yellow sputum. Denies fever, chills, cp, palp, n/v, lower ext edema. Pt was started on levaquin yesterday. Pt hypoxic today w pox 80% on RA. CXR ? Infiltrate. Pt will be admitted for hypoxia, acute respiratory failure.   Pt seen for MST. Pt BMI categorized as healthy. Per chart, pt has experienced a 15% weight loss in the past 4 months which is significant for time frame. Pt is currently NPO but RN said he should be advanced today after procedure. Ensure has already been ordered. Will monitor diet advancement and continue Ensure once diet is advanced.   Pt reports poor appetite about 1 month PTA. Pt reports no noticible decrease in intake. Pt states he prepares his own food. Pt also has poor eyesight and reported increased difficulty in food preparation over the past month. Pt denies swallowing and chewing difficulties. Pt reports eating TID. Breakfast is cream of wheat and toast with jelly, lunch is soup, and dinner is hamburger, mashed potatoes and vegetables. Pt denies N/V and abdominal pain associated with eating. Pt reports use of Ensure BID at home. Pt would like to receive Ensure while at hospital.  Suspect weight loss d/t advanced age.   NFPE: Severe muscle depletion,  severe fat depletion, no edema.   Meds reviewed;   Labs reviewed; BUN 1.38 mg/dl, Ca 8.1 mg/dl, GFR 36 ml/min, glucose 115 mg/dl    Diet Order:  Diet NPO time specified Except for: Sips with Meds  Skin:  Reviewed, no issues  Last BM:  4/6  Height:   Ht Readings from Last 1 Encounters:  03/14/16 5\' 6"  (1.676 m)    Weight:   Wt Readings from Last 1 Encounters:  03/15/16 128 lb 15.5 oz (58.5 kg)    Ideal Body Weight:  64.5 kg  BMI:  Body mass index is 20.83 kg/(m^2).  Estimated Nutritional Needs:   Kcal:  1350-1550 kcals (23-26 kcals/kg)  Protein:  75-87 g (1.3-1.5 g/kg)  Fluid:  1.3-1.5  EDUCATION NEEDS:   No education needs identified at this time  Geoffery Lyons, [place] Dietetic Intern Pager [phone]

## 2016-03-15 NOTE — Progress Notes (Signed)
Subjective: Acute respiratory failure presumed to be due to pneumonia.  Slight dyspnea.  Pt states feeling better this am. Less cough.  Afebrile.  Denies cp, palp, n/v, diarrhea. Tachycardia resolved.  Lymphoma; holding medication.  Gerd:  Denies symptoms.   Renal insufficiency:  Stable.    Objective: Vital signs in last 24 hours: Temp:  [98.3 F (36.8 C)-98.5 F (36.9 C)] 98.5 F (36.9 C) (04/06 2325) Pulse Rate:  [76-112] 76 (04/07 0757) Resp:  [18-22] 18 (04/07 0757) BP: (101-138)/(42-61) 101/42 mmHg (04/06 2325) SpO2:  [86 %-97 %] 96 % (04/07 0757) FiO2 (%):  [97 %] 97 % (04/06 1949) Weight:  [58.5 kg (128 lb 15.5 oz)-60.4 kg (133 lb 2.5 oz)] 58.5 kg (128 lb 15.5 oz) (04/07 0500) Weight change:  Last BM Date: 03/14/16  Intake/Output from previous day: 04/06 0701 - 04/07 0700 In: 280.8 [I.V.:280.8] Out: -  Intake/Output this shift:    Gen: axox3,  Heent: anicteric Neck: no jvd Heart: rrr s1, s2,  Lung: + crackles left lung base, slight rhonchi in the right lung base.   ABd: soft Ext:  No c/c,  Trace edema.  Skin: no rash Lymph: no cervical adenopathy  Lab Results:  Recent Labs  03/14/16 1733  WBC 3.5*  HGB 10.9*  HCT 33.5*  PLT 99*   BMET  Recent Labs  03/14/16 1733  NA 135  K 4.7  CL 104  CO2 22  GLUCOSE 115*  BUN 50*  CREATININE 1.58*  CALCIUM 8.1*    Studies/Results: X-ray Chest Pa And Lateral  03/14/2016  CLINICAL DATA:  Cough, loss of appetite EXAM: CHEST  2 VIEW COMPARISON:  Multiple prior studies FINDINGS: Mildly enlarged cardiac silhouette stable. There is moderate central vascular congestion. Mild hypoventilatory change right lung base. Mildly more conspicuous left lower lobe opacity. No pleural effusion. IMPRESSION: Asymmetric opacity left lower lobe could represent developing infiltrate related to pneumonia versus atelectasis. Electronically Signed   By: Skipper Cliche M.D.   On: 03/14/2016 20:05    Medications: I have reviewed the  patient's current medications.  Assessment/Plan: Acute respiratory failure secondary to pneumonia Cont vanco , rocephin, zithromax.  Consider speech consult r/o aspiration  Tachycardia resolved.   Renal insufficiency Check cmp in am  Anemia/ Thrombocytopenia Check cbc, in am   Protein calorie malnutrition Nutrition consult  Start ensure 1 can po tid with meals  Gerd asymptomatic presently  DVT prophylaxis lovenox  LOS: 1 day   [name] 03/15/2016, 8:11 AM

## 2016-03-16 LAB — CBC WITH DIFFERENTIAL/PLATELET
Basophils Absolute: 0 10*3/uL (ref 0.0–0.1)
Basophils Relative: 0 %
EOS ABS: 0 10*3/uL (ref 0.0–0.7)
EOS PCT: 0 %
HCT: 30.9 % — ABNORMAL LOW (ref 39.0–52.0)
HEMOGLOBIN: 9.8 g/dL — AB (ref 13.0–17.0)
LYMPHS PCT: 56 %
Lymphs Abs: 1.6 10*3/uL (ref 0.7–4.0)
MCH: 28.4 pg (ref 26.0–34.0)
MCHC: 31.7 g/dL (ref 30.0–36.0)
MCV: 89.6 fL (ref 78.0–100.0)
Monocytes Absolute: 0.2 10*3/uL (ref 0.1–1.0)
Monocytes Relative: 7 %
NEUTROS PCT: 37 %
Neutro Abs: 1.1 10*3/uL — ABNORMAL LOW (ref 1.7–7.7)
PLATELETS: 84 10*3/uL — AB (ref 150–400)
RBC: 3.45 MIL/uL — AB (ref 4.22–5.81)
RDW: 16.8 % — ABNORMAL HIGH (ref 11.5–15.5)
WBC: 2.9 10*3/uL — AB (ref 4.0–10.5)

## 2016-03-16 LAB — COMPREHENSIVE METABOLIC PANEL
ALT: 20 U/L (ref 17–63)
AST: 34 U/L (ref 15–41)
Albumin: 2 g/dL — ABNORMAL LOW (ref 3.5–5.0)
Alkaline Phosphatase: 118 U/L (ref 38–126)
Anion gap: 6 (ref 5–15)
BUN: 38 mg/dL — AB (ref 6–20)
CHLORIDE: 104 mmol/L (ref 101–111)
CO2: 24 mmol/L (ref 22–32)
CREATININE: 1.35 mg/dL — AB (ref 0.61–1.24)
Calcium: 7.9 mg/dL — ABNORMAL LOW (ref 8.9–10.3)
GFR calc Af Amer: 50 mL/min — ABNORMAL LOW (ref >60)
GFR calc non Af Amer: 43 mL/min — ABNORMAL LOW (ref >60)
Glucose, Bld: 109 mg/dL — ABNORMAL HIGH (ref 65–99)
Potassium: 4.4 mmol/L (ref 3.5–5.1)
SODIUM: 134 mmol/L — AB (ref 135–145)
Total Bilirubin: 0.4 mg/dL (ref 0.3–1.2)
Total Protein: 4.1 g/dL — ABNORMAL LOW (ref 6.5–8.1)

## 2016-03-16 MED ORDER — FAMOTIDINE 10 MG PO TABS
10.0000 mg | ORAL_TABLET | Freq: Two times a day (BID) | ORAL | Status: DC
  Administered 2016-03-16 – 2016-03-19 (×7): 10 mg via ORAL
  Filled 2016-03-16 (×8): qty 1

## 2016-03-16 MED ORDER — ENSURE ENLIVE PO LIQD
237.0000 mL | Freq: Three times a day (TID) | ORAL | Status: DC
  Administered 2016-03-16 – 2016-03-19 (×10): 237 mL via ORAL

## 2016-03-16 MED ORDER — ALBUTEROL SULFATE (2.5 MG/3ML) 0.083% IN NEBU: 2.5000 mg | INHALATION_SOLUTION | Freq: Four times a day (QID) | RESPIRATORY_TRACT | Status: DC | PRN

## 2016-03-16 NOTE — Progress Notes (Signed)
Subjective: Acute respiratory failure: secondary to pneumonia,  Pt is tolerating abx.  Feeling stronger,  Still with slight dry cough.  Pt has slight dyspnea.  Denies cp, palp, n/v, lower ext edema.  Cough/ gerd:  Pt wondering if gerd contributing to cough even though no symptoms.  Protein calorie malnutrition.  Pt amenable to ensure.  Lymphoma: imbrutinib on hold.  Anemia/ thrombocytopenia, stable,  Denies brbpr, black stool.     Objective: Vital signs in last 24 hours: Temp:  [98.1 F (36.7 C)-98.5 F (36.9 C)] 98.2 F (36.8 C) (04/08 0501) Pulse Rate:  [70-76] 72 (04/08 0501) Resp:  [18] 18 (04/08 0501) BP: (110-113)/(48-55) 113/55 mmHg (04/08 0501) SpO2:  [95 %-98 %] 97 % (04/08 0501) Weight:  [58.3 kg (128 lb 8.5 oz)] 58.3 kg (128 lb 8.5 oz) (04/08 0501) Weight change: -2.1 kg (-4 lb 10.1 oz) Last BM Date: 03/14/16  Intake/Output from previous day: 04/07 0701 - 04/08 0700 In: 240 [P.O.:240] Out: 1500 [Urine:1500] Intake/Output this shift: Total I/O In: -  Out: 900 [Urine:900]  Gen: axox3 Heent: anicteric Neck: no jvd Heart: rrr s1, s2,  Lung: slight crackles left lung base,  Mild rhonchi right lung base.  Abd: soft Ext: no c/c/e Skin: no rash   Lab Results:  Recent Labs  03/14/16 1733  WBC 3.5*  HGB 10.9*  HCT 33.5*  PLT 99*   BMET  Recent Labs  03/14/16 1733  NA 135  K 4.7  CL 104  CO2 22  GLUCOSE 115*  BUN 50*  CREATININE 1.58*  CALCIUM 8.1*    Studies/Results: X-ray Chest Pa And Lateral  03/14/2016  CLINICAL DATA:  Cough, loss of appetite EXAM: CHEST  2 VIEW COMPARISON:  Multiple prior studies FINDINGS: Mildly enlarged cardiac silhouette stable. There is moderate central vascular congestion. Mild hypoventilatory change right lung base. Mildly more conspicuous left lower lobe opacity. No pleural effusion. IMPRESSION: Asymmetric opacity left lower lobe could represent developing infiltrate related to pneumonia versus atelectasis. Electronically  Signed   By: Skipper Cliche M.D.   On: 03/14/2016 20:05   Nm Pulmonary Perf And Vent  03/15/2016  CLINICAL DATA:  Hypoxia EXAM: NUCLEAR MEDICINE VENTILATION - PERFUSION LUNG SCAN Views: Anterior, posterior, left lateral, right lateral, RPO, LPO, RAO, LAO -ventilation and perfusion RADIOPHARMACEUTICALS:  32.9 mCi Technetium-20m DTPA aerosol inhalation and 4.16 mCi Technetium-17m MAA IV COMPARISON:  Chest radiograph March 14, 2016 FINDINGS: Ventilation: There are scattered nonsegmental ventilation defects bilaterally. No well-defined wedge-shaped ventilation defects are identified. Perfusion: Radiotracer uptake on the perfusion study is homogeneous and symmetric bilaterally. No appreciable perfusion defects are identified. IMPRESSION: No appreciable perfusion defects. Very low probability of pulmonary embolus. Scattered nonsegmental ventilation defects potentially could represent scattered areas of peripheral mucus plugging. Electronically Signed   By: Lowella Grip III M.D.   On: 03/15/2016 13:39    Medications: I have reviewed the patient's current medications.  Assessment/Plan: Acute respiratory failure secondary to pneumonia Cont vanco iv, rocephin iv, zithromax iv May transition to po abx tomorrow if doing better.  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

## 2016-03-16 NOTE — Progress Notes (Signed)
Report riecived from Elnita Maxwell and agree with assessment.

## 2016-03-16 NOTE — Evaluation (Signed)
Physical Therapy Evaluation Patient Details Name: Daniel [name] MRN: [id number] DOB: [birth year] Today's Date: 03/16/2016   History of Present Illness  82 yo male with hx of NHL admitted with acute repsiratory failure and hypoxia   Clinical Impression  Very pleasant man who lives by himself and recognizes need for short term SNF to get stronger.  He has oxygen desaturation with mobility.  He will benefit from and be able to participate with 5 times a week PT at SNF    Follow Up Recommendations SNF    Equipment Recommendations  Rolling walker with 5" wheels    Recommendations for Other Services OT consult     Precautions / Restrictions Precautions Precautions: Fall Precaution Comments: decreased O2 sats with ambulartion Restrictions Weight Bearing Restrictions: No      Mobility  Bed Mobility Overal bed mobility: Needs Assistance Bed Mobility: Supine to Sit     Supine to sit: Min assist     General bed mobility comments: min assist for mobility on hospital mattress  Transfers Overall transfer level: Needs assistance Equipment used: Rolling walker (2 wheeled) Transfers: Sit to/from Stand Sit to Stand: Mod assist         General transfer comment: pt needs more assist from low surfaces  Ambulation/Gait Ambulation/Gait assistance: Min assist Ambulation Distance (Feet): 50 Feet Assistive device: Rolling walker (2 wheeled) Gait Pattern/deviations: Step-to pattern Gait velocity:  (decreased)   General Gait Details: pt motivated to walk, he has a flexed posture and and decreased O2 sat to 85% when walking on room air.  Dyspnea noted, O2 sats improved to > 90 % when he sat down and O2 reapplied.   Stairs            Wheelchair Mobility    Modified Rankin (Stroke Patients Only)       Balance Overall balance assessment: Modified Independent                                           Pertinent Vitals/Pain Pain Assessment: No/denies  pain    Home Living Family/patient expects to be discharged to:: Private residence Living Arrangements: Alone Available Help at Discharge: Other (Comment) (pt states he does not have assist at home) Type of Home: Other(Comment) Home Access: Stairs to enter       [place]: [place] - 2 wheels;Cane - single point Additional Comments: pt agreeable to going to a short term SNF for rehab    Prior Function Level of Independence: Independent with assistive device(s)         Comments: has a cane and rolling walker      Hand Dominance        Extremity/Trunk Assessment   Upper Extremity Assessment: Overall WFL for tasks assessed           Lower Extremity Assessment: Generalized weakness;RLE deficits/detail;LLE deficits/detail (ROM with within functional limits ) RLE Deficits / Details: Pt states he has been having occasional pain in right knee and hip    Cervical / Trunk Assessment: Kyphotic  Communication   Communication: HOH (bilateral hearing aids)  Cognition Arousal/Alertness: Awake/alert Behavior During Therapy: WFL for tasks assessed/performed Overall Cognitive Status: Within Functional Limits for tasks assessed                      General Comments General comments (skin integrity, edema, etc.): skin appears fragile at  many places on arms , possilbe edema in lower legs.     Exercises        Assessment/Plan    PT Assessment Patient needs continued PT services  PT Diagnosis Difficulty walking;Abnormality of gait;Generalized weakness   PT Problem List Decreased strength;Decreased activity tolerance;Cardiopulmonary status limiting activity  PT Treatment Interventions Gait training;Functional mobility training;Therapeutic activities;Therapeutic exercise;Balance training;Patient/family education   PT Goals (Current goals can be found in the Care Plan section) Acute Rehab PT Goals Patient Stated Goal: to go someplace to get  stronger before he goes  home  PT Goal Formulation: With patient Time For Goal Achievement: 03/30/16 Potential to Achieve Goals: Good    Frequency Min 2X/week   Barriers to discharge Decreased caregiver support  (lives alone in 2 story townhome He recognizes need for assis)    Co-evaluation               End of Session Equipment Utilized During Treatment: Gait belt;Oxygen Activity Tolerance: Treatment limited secondary to medical complications (Comment) (oxygen desaturation with mobility ) Patient left: in chair;with chair alarm set Nurse Communication: Mobility status         Time: [id number] PT Time Calculation (min) (ACUTE ONLY): 30 min   Charges:   PT Evaluation $PT Eval Low Complexity: 1 Procedure     PT G Codes:       Daniel [name], PT   Daniel [name] 03/16/2016, 1:07 PM

## 2016-03-17 LAB — COMPREHENSIVE METABOLIC PANEL
ALK PHOS: 132 U/L — AB (ref 38–126)
ALT: 24 U/L (ref 17–63)
ANION GAP: 7 (ref 5–15)
AST: 42 U/L — ABNORMAL HIGH (ref 15–41)
Albumin: 2 g/dL — ABNORMAL LOW (ref 3.5–5.0)
BUN: 33 mg/dL — ABNORMAL HIGH (ref 6–20)
CALCIUM: 7.8 mg/dL — AB (ref 8.9–10.3)
CO2: 25 mmol/L (ref 22–32)
Chloride: 102 mmol/L (ref 101–111)
Creatinine, Ser: 1.01 mg/dL (ref 0.61–1.24)
GFR calc non Af Amer: >60 mL/min (ref >60)
Glucose, Bld: 101 mg/dL — ABNORMAL HIGH (ref 65–99)
Potassium: 4.8 mmol/L (ref 3.5–5.1)
SODIUM: 134 mmol/L — AB (ref 135–145)
TOTAL PROTEIN: 4.1 g/dL — AB (ref 6.5–8.1)
Total Bilirubin: 0.2 mg/dL — ABNORMAL LOW (ref 0.3–1.2)

## 2016-03-17 LAB — CBC
HCT: 30.5 % — ABNORMAL LOW (ref 39.0–52.0)
HEMOGLOBIN: 9.8 g/dL — AB (ref 13.0–17.0)
MCH: 28.9 pg (ref 26.0–34.0)
MCHC: 32.1 g/dL (ref 30.0–36.0)
MCV: 90 fL (ref 78.0–100.0)
Platelets: 78 10*3/uL — ABNORMAL LOW (ref 150–400)
RBC: 3.39 MIL/uL — ABNORMAL LOW (ref 4.22–5.81)
RDW: 16.9 % — ABNORMAL HIGH (ref 11.5–15.5)
WBC: 2.5 10*3/uL — ABNORMAL LOW (ref 4.0–10.5)

## 2016-03-17 NOTE — Care Management Important Message (Signed)
Important Message  Patient Details  Name: Daniel [name] MRN: [id number] Date of Birth: 09-Apr-1919   Medicare Important Message Given:  Yes    Apolonio Schneiders, RN 03/17/2016, 10:29 AM

## 2016-03-17 NOTE — Progress Notes (Signed)
Pharmacy Antibiotic Follow-up Note  Daniel [name] is a 82 y.o. year-old male with hx lymphoma admitted on 03/14/2016.  The patient is currently on day 1 of Rocephin & Flagyl with added Vancomycin for possible MRSA for PNA. Per med rec: patient had not yet started on Levaquin and Azithromycin prescribed as outpatient  Assessment/Plan: D4 Antibiotics Note MD may transition to PO antibiotics today if pt improving SCr better For today, continue current regimen and hold off on increasing vanc dose due to advanced age, risk of accumulation, possible de-escalation today.   Vanc 500mg  q24h Rocephin 1gm q24 Azithromcyin 500mg  IV q24 F/u renal fxn, VT at Css if warranted, cultures, clinical course  Temp (24hrs), Avg:97.6 F (36.4 C), Min:97.5 F (36.4 C), Max:97.8 F (36.6 C)   Recent Labs Lab 03/14/16 1733 03/16/16 0544 03/17/16 0523  WBC 3.5* 2.9* 2.5*     Recent Labs Lab 03/14/16 1733 03/16/16 0544 03/17/16 0523  CREATININE 1.58* 1.35* 1.01   Estimated Creatinine Clearance: 38.6 mL/min (by C-G formula based on Cr of 1.01).    No Known Allergies  Antimicrobials this admission: 4/6 Rocephin >>  4/6 Azithromycin >>  4/6 Vancomycin >>  Levels/dose changes this admission:  Microbiology results: 4/6 BCx: NGTD Legionella: ordered, not collected  Thank you for allowing pharmacy to be a part of this patient's care.  Ralene Bathe, PharmD, BCPS 03/17/2016, 12:47 PM  Pager: [phone]

## 2016-03-17 NOTE — Progress Notes (Signed)
Subjective: Acute resp failure secondary to pneumonia:  Pt doing better.  Denies dyspnea, cp.   Protein calorie malnutrition: on ensure Anemia/ thrombocytopenia, stable,  Secondary to lymphoma Bph: has foley catheter.    Objective: Vital signs in last 24 hours: Temp:  [97.5 F (36.4 C)-97.8 F (36.6 C)] 97.5 F (36.4 C) (04/09 1452) Pulse Rate:  [75-88] 88 (04/09 1452) Resp:  [20-24] 20 (04/09 1452) BP: (107-120)/(40-55) 120/55 mmHg (04/09 1452) SpO2:  [91 %-100 %] 98 % (04/09 1452) Weight:  [61 kg (134 lb 7.7 oz)] 61 kg (134 lb 7.7 oz) (04/09 [id number]) Weight change: 2.7 kg (5 lb 15.2 oz) Last BM Date: 03/14/16  Intake/Output from previous day: 04/08 0701 - 04/09 0700 In: 240 [P.O.:240] Out: 1400 [Urine:1400] Intake/Output this shift:    Gen: axox3 Heent: anicteric Neck: no jvd Heart: rrr s1, s2 Lung: slight crackle left lung base Abd: soft Ext: no c/c/e Deconditioned Foley in place  Lab Results:  Recent Labs  03/16/16 0544 03/17/16 0523  WBC 2.9* 2.5*  HGB 9.8* 9.8*  HCT 30.9* 30.5*  PLT 84* 78*   BMET  Recent Labs  03/16/16 0544 03/17/16 0523  NA 134* 134*  K 4.4 4.8  CL 104 102  CO2 24 25  GLUCOSE 109* 101*  BUN 38* 33*  CREATININE 1.35* 1.01  CALCIUM 7.9* 7.8*    Studies/Results: No results found.  Medications: I have reviewed the patient's current medications.  Assessment/Plan: Acute respiratory failure secondary to pneumonia Cont vanco, rocephin, zithromax  Protein calorie malnutrition On ensure  Pancytopenia Stable  Jerrye Bushy /Chronic coughon pepcid  DVT prophylaxis: scd, lovenox   LOS: 3 days   [name] 03/17/2016, 7:41 PM

## 2016-03-18 LAB — COMPREHENSIVE METABOLIC PANEL
ALBUMIN: 2.1 g/dL — AB (ref 3.5–5.0)
ALK PHOS: 192 U/L — AB (ref 38–126)
ALT: 41 U/L (ref 17–63)
ANION GAP: 9 (ref 5–15)
AST: 64 U/L — ABNORMAL HIGH (ref 15–41)
BUN: 31 mg/dL — ABNORMAL HIGH (ref 6–20)
CALCIUM: 8.2 mg/dL — AB (ref 8.9–10.3)
CO2: 29 mmol/L (ref 22–32)
Chloride: 101 mmol/L (ref 101–111)
Creatinine, Ser: 0.92 mg/dL (ref 0.61–1.24)
GFR calc non Af Amer: >60 mL/min (ref >60)
GLUCOSE: 121 mg/dL — AB (ref 65–99)
POTASSIUM: 4.4 mmol/L (ref 3.5–5.1)
SODIUM: 139 mmol/L (ref 135–145)
Total Bilirubin: 0.5 mg/dL (ref 0.3–1.2)
Total Protein: 4.3 g/dL — ABNORMAL LOW (ref 6.5–8.1)

## 2016-03-18 LAB — CBC WITH DIFFERENTIAL/PLATELET
BASOS ABS: 0 10*3/uL (ref 0.0–0.1)
BASOS PCT: 0 %
EOS ABS: 0 10*3/uL (ref 0.0–0.7)
Eosinophils Relative: 0 %
HCT: 32.8 % — ABNORMAL LOW (ref 39.0–52.0)
Hemoglobin: 10.4 g/dL — ABNORMAL LOW (ref 13.0–17.0)
LYMPHS ABS: 1.5 10*3/uL (ref 0.7–4.0)
LYMPHS PCT: 67 %
MCH: 28.6 pg (ref 26.0–34.0)
MCHC: 31.7 g/dL (ref 30.0–36.0)
MCV: 90.1 fL (ref 78.0–100.0)
MONO ABS: 0.2 10*3/uL (ref 0.1–1.0)
Monocytes Relative: 7 %
NEUTROS ABS: 0.6 10*3/uL — AB (ref 1.7–7.7)
Neutrophils Relative %: 26 %
PLATELETS: 83 10*3/uL — AB (ref 150–400)
RBC: 3.64 MIL/uL — ABNORMAL LOW (ref 4.22–5.81)
RDW: 16.6 % — AB (ref 11.5–15.5)
WBC: 2.3 10*3/uL — ABNORMAL LOW (ref 4.0–10.5)

## 2016-03-18 MED ORDER — ENOXAPARIN SODIUM 40 MG/0.4ML ~~LOC~~ SOLN
40.0000 mg | SUBCUTANEOUS | Status: DC
  Administered 2016-03-18: 40 mg via SUBCUTANEOUS
  Filled 2016-03-18 (×2): qty 0.4

## 2016-03-18 MED ORDER — TECHNETIUM TC 99M MERTIATIDE
2.0000 | Freq: Once | INTRAVENOUS | Status: AC | PRN
  Administered 2016-03-19: 2 via INTRAVENOUS

## 2016-03-18 NOTE — Progress Notes (Signed)
Subjective: Acute respiratory failure secondary to pneumonia Pt tolerating vanco/rocephin, zithromax Pox today on RA 90% with walking 88%  Protein calorie malnutrition On ensure  Gerd/ chronic cough: on pepcid  Lymphoma/ pancytopenia imbrutinib on hold, blood counts stable  Objective: Vital signs in last 24 hours: Temp:  [97.5 F (36.4 C)-98 F (36.7 C)] 97.9 F (36.6 C) (04/10 0531) Pulse Rate:  [72-88] 72 (04/10 0749) Resp:  [18-28] 18 (04/10 0749) BP: (102-120)/(51-55) 102/52 mmHg (04/10 0531) SpO2:  [91 %-100 %] 99 % (04/10 0749) Weight:  [58.2 kg (128 lb 4.9 oz)] 58.2 kg (128 lb 4.9 oz) (04/10 0531) Weight change: -2.8 kg (-6 lb 2.8 oz) Last BM Date: 03/17/16  Intake/Output from previous day: 04/09 0701 - 04/10 0700 In: 540 [P.O.:240; IV Piggyback:300] Out: 1550 [Urine:1550] Intake/Output this shift:    Heent: anicteric Neck: no jvd Heart: rrr s1, s2 Lung: slight exp wheezing, minimal Abd: soft, + umbilical hernia Ext: no c/c/e   Lab Results:  Recent Labs  03/16/16 0544 03/17/16 0523  WBC 2.9* 2.5*  HGB 9.8* 9.8*  HCT 30.9* 30.5*  PLT 84* 78*   BMET  Recent Labs  03/16/16 0544 03/17/16 0523  NA 134* 134*  K 4.4 4.8  CL 104 102  CO2 24 25  GLUCOSE 109* 101*  BUN 38* 33*  CREATININE 1.35* 1.01  CALCIUM 7.9* 7.8*    Studies/Results: No results found.  Medications: I have reviewed the patient's current medications.  Assessment/Plan: Acute resp failure secondary to pneumonia Cont vanco , rocephin, zithromax D4  Wheezing , bronchiectasis Cont albuterol neb  Gerd/ chronic cough Cont pepcid   Pancytopenia Stable  Protein calorie malnutrition  Stable.   DVT prophylaxis: scd, lovenox     LOS: 4 days   [name] 03/18/2016, 8:02 AM

## 2016-03-18 NOTE — Clinical Social Work Placement (Signed)
   CLINICAL SOCIAL WORK PLACEMENT  NOTE  Date:  03/18/2016  Patient Details  Name: Daniel [name] MRN: [id number] Date of Birth: [birth year]  Clinical Social Work is seeking post-discharge placement for this patient at the [place] level of care (*CSW will initial, date and re-position this form in  chart as items are completed):  Yes   Patient/family provided with [place] Work Department's list of facilities offering this level of care within the geographic area requested by the patient (or if unable, by the patient's family).  Yes   Patient/family informed of their freedom to choose among providers that offer the needed level of care, that participate in Medicare, Medicaid or managed care program needed by the patient, have an available bed and are willing to accept the patient.  Yes   Patient/family informed of 's ownership interest in [facility] and [facility], as well as of the fact that they are under no obligation to receive care at these facilities.  PASRR submitted to EDS on 03/18/16     PASRR number received on 03/18/16     Existing PASRR number confirmed on       FL2 transmitted to all facilities in geographic area requested by pt/family on 03/18/16     FL2 transmitted to all facilities within larger geographic area on 03/18/16     Patient informed that his/her managed care company has contracts with or will negotiate with certain facilities, including the following:            Patient/family informed of bed offers received.  Patient chooses bed at       Physician recommends and patient chooses bed at      Patient to be transferred to   on  .  Patient to be transferred to facility by       Patient family notified on   of transfer.  Name of family member notified:        PHYSICIAN       Additional Comment:    _______________________________________________ Harlon Flor, Student-SW 03/18/2016, 1:45  PM

## 2016-03-18 NOTE — NC FL2 (Signed)
[place] LEVEL OF CARE SCREENING TOOL     IDENTIFICATION  Patient Name: Daniel [name] Birthdate: Mar 24, 1919 Sex: male Admission Date (Current Location): 03/14/2016  [facility] and [place] Number:  Herbalist and Address:  [facility],  [address] [place], [place]      Provider Number: [id number]  Attending Physician Name and Address:  [name], MD  Relative Name and Phone Number:       Current Level of Care: Hospital Recommended Level of Care: [place] Prior Approval Number:    Date Approved/Denied:   PASRR Number: [id number]  Discharge Plan: SNF    Current Diagnoses: Patient Active Problem List   Diagnosis Date Noted  . Protein-calorie malnutrition, severe 03/15/2016  . Hypoxia 03/14/2016  . Lesion of left lung 03/07/2016  . Protein-calorie malnutrition, moderate ([place]) 03/07/2016  . Thrombocytopenia ([place]) 06/01/2015  . Hernia, inguinal, right 06/01/2015  . Essential hypertension 06/01/2015  . Chronic kidney disease (CKD), stage III (moderate) 01/10/2015  . 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 Preventive measure 11/14/2014  . Hydronephrosis, right 10/27/2014  . Cellulitis of arm, right 02/11/2014  . Bronchiectasis without acute exacerbation ([place]) 06/09/2013  . Hydroureteronephrosis 05/12/2012  . 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 CHRONIC AIRWAY OBSTRUCTION NEC 09/11/2009    Orientation RESPIRATION BLADDER Height & Weight     Self, Time, Situation, Place  O2 (2 L/min) Indwelling catheter Weight: 128 lb 4.9 oz (58.2 kg) Height:  5\' 6"  (167.6 cm)  BEHAVIORAL SYMPTOMS/MOOD NEUROLOGICAL BOWEL NUTRITION STATUS  Other (Comment) (None)  (None) Continent Diet  AMBULATORY STATUS COMMUNICATION OF NEEDS Skin   Limited Assist Verbally Normal                       Personal Care Assistance Level of Assistance  Bathing, Dressing, Feeding Bathing Assistance:  Limited assistance Feeding assistance: Independent Dressing Assistance: Limited assistance     Functional Limitations Info  Sight, Hearing, Speech Sight Info: Adequate Hearing Info: Impaired Speech Info: Adequate    SPECIAL CARE FACTORS FREQUENCY  PT (By licensed PT), OT (By licensed OT)     PT Frequency: 5 x week OT Frequency: 5 x week            Contractures Contractures Info: Not present    Additional Factors Info  Code Status, Allergies Code Status Info: FULL Code Allergies Info: No Known Allergies           Current Medications (03/18/2016):  This is the current hospital active medication list Current Facility-Administered Medications  Medication Dose Route Frequency Provider Last Rate Last Dose  . acetaminophen (TYLENOL) tablet 650 mg  650 mg Oral Q6H PRN [name], MD       Or  . acetaminophen (TYLENOL) suppository 650 mg  650 mg Rectal Q6H PRN [name], MD      . albuterol (PROVENTIL) (2.5 MG/3ML) 0.083% nebulizer solution 2.5 mg  2.5 mg Nebulization TID [name], MD   2.5 mg at 03/18/16 0749  . albuterol (PROVENTIL) (2.5 MG/3ML) 0.083% nebulizer solution 2.5 mg  2.5 mg Nebulization Q6H PRN [name], MD      . azithromycin (ZITHROMAX) 500 mg in dextrose 5 % 250 mL IVPB  500 mg Intravenous Q24H [name], MD   500 mg at 03/17/16 1847  . cefTRIAXone (ROCEPHIN) 1 g in dextrose 5 % 50 mL IVPB  1 g Intravenous Q24H [name], MD  1 g at 03/17/16 1752  . enoxaparin (LOVENOX) injection 40 mg  40 mg Subcutaneous Q24H Anh P Pham, RPH      . famotidine (PEPCID) tablet 10 mg  10 mg Oral BID [name], MD   10 mg at 03/18/16 1051  . feeding supplement (ENSURE ENLIVE) (ENSURE ENLIVE) liquid 237 mL  237 mL Oral TID BM [name], MD   237 mL at 03/18/16 1051  . finasteride (PROSCAR) tablet 5 mg  5 mg Oral Q breakfast [name], MD   5 mg at 03/18/16 [id number]  . polyvinyl alcohol (LIQUIFILM TEARS) 1.4 % ophthalmic solution 1 drop  1 drop Both Eyes QID [name], MD   1 drop at  03/18/16 1051  . sodium chloride flush (NS) 0.9 % injection 3 mL  3 mL Intravenous Q12H [name], MD   3 mL at 03/17/16 1049  . technetium TC 36M diethylenetriame-pentaacetic acid (DTPA) injection 32.9 milli Curie  32.9 milli Curie Intravenous Once PRN [name], MD      . vancomycin (VANCOCIN) 500 mg in sodium chloride 0.9 % 100 mL IVPB  500 mg Intravenous Q24H Minda Ditto, RPH   500 mg at 03/17/16 2024     Discharge Medications: Please see discharge summary for a list of discharge medications.  Relevant Imaging Results:  Relevant Lab Results:   Additional Information SSN: [id number]  Callie Taylor, Student-SW [phone]

## 2016-03-18 NOTE — Clinical Social Work Note (Signed)
Clinical Social Work Assessment  Patient Details  Name: Daniel [name] MRN: [id number] Date of Birth: 01/25/1919  Date of referral:  03/18/16               Reason for consult:  Facility Placement                Permission sought to share information with:  Facility Sport and exercise psychologist, Family Supports Permission granted to share information::  Yes, Verbal Permission Granted  Name::     Tommi Rumps  Agency::  [facility] SNF Search  Relationship::  Metallurgist Information:     Housing/Transportation Living arrangements for the past 2 months:  [place] of Information:  Patient Patient Interpreter Needed:  None Criminal Activity/Legal Involvement Pertinent to Current Situation/Hospitalization:  No - Comment as needed Significant Relationships:  Adult Children Lives with:  Self Do you feel safe going back to the place where you live?  Yes Need for family participation in patient care:  No (Coment)  Care giving concerns:  Pt admitted from home alone. PT recommending short-term rehab at a SNF.   Social Worker assessment / plan:  CSW received PT recommendation consult. BSW Intern met with pt at bedside. BSW Intern introduced self and explained role. Pt states he lives at home alone and his knees have been getting worse in the last three weeks. BSW Intern explained PT recommendation for short-term rehab. Pt agreeable to [facility] search. Pt requested BSW Intern to contact his Lawana Pai, regarding dc plans.   Pt states he is familiar with SNFs because his late wife had been to 32 different rehab facilities. [facility] is his first choice. He is also familiar with [facility].   BSW Intern attempted to contact Lawana Pai via telephone. William to call BSW Intern back in about 15 minutes.  BSW Intern to complete FL2 and conduct a [facility] search via [employer].   BSW Intern to follow-up with bed  offers.  CSW continuing to follow.   Employment status:  Retired Forensic scientist:  Medicare PT Recommendations:  Felt / Referral to community resources:  [place]  Patient/Family's Response to care:  Pt alert and oriented x4. Pt active and pleasant in conversation. Pt states he is willing to do what we think is best.   Patient/Family's Understanding of and Emotional Response to Diagnosis, Current Treatment, and Prognosis:  Pt understanding of current condition and reports no further questions at this time.   Emotional Assessment Appearance:  Appears stated age Attitude/Demeanor/Rapport:  Other (Appropriate) Affect (typically observed):  Accepting, Hopeful, Pleasant Orientation:  Oriented to Self, Oriented to Place, Oriented to  Time, Oriented to Situation Alcohol / Substance use:  Not Applicable Psych involvement (Current and /or in the community):  No (Comment)  Discharge Needs  Concerns to be addressed:  Care Coordination Readmission within the last 30 days:  No Current discharge risk:  None Barriers to Discharge:  Continued Medical Work up   [employer], Student-SW 03/18/2016, 1:45 PM

## 2016-03-18 NOTE — Progress Notes (Signed)
CSW continuing to follow.  Per MD notes, pt dc for tomorrow.   BSW Intern spoke with pt Daniel [name] via telephone to discuss pt dc plans. Pt stepson agrees with [facility] or [facility] depending on private room at facility.  CSW contacted [facility] Representative regarding private room. [facility] asked to follow-up in the morning.   BSW Intern met with pt and pt stepson at bedside to provide bed offers. Pt and pt stepson choose bed at [facility].  BSW Intern to assist with disposition needs tomorrow.  CSW to continue to follow.  Harlon Flor, [place] Intern Clinical Social Work Department  [phone]

## 2016-03-18 NOTE — Progress Notes (Signed)
I have been following this man for right-sided hydronephrosis. there is a question whether this has been adding to the man's renal insufficiency. I ordered a Lasix renogram to be done tomorrow, prior to his being admitted the hospital.  With your permission, we will perform this procedure during the hospitalization.  I have put that order in

## 2016-03-19 ENCOUNTER — Inpatient Hospital Stay (HOSPITAL_COMMUNITY): Payer: Medicare Other

## 2016-03-19 ENCOUNTER — Ambulatory Visit (HOSPITAL_COMMUNITY): Payer: Medicare Other

## 2016-03-19 DIAGNOSIS — J9600 Acute respiratory failure, unspecified whether with hypoxia or hypercapnia: Secondary | ICD-10-CM

## 2016-03-19 LAB — CULTURE, BLOOD (ROUTINE X 2)
CULTURE: NO GROWTH
Culture: NO GROWTH

## 2016-03-19 MED ORDER — FUROSEMIDE 10 MG/ML IJ SOLN
30.0000 mg | Freq: Once | INTRAMUSCULAR | Status: AC
  Administered 2016-03-19: 30 mg via INTRAVENOUS

## 2016-03-19 MED ORDER — FUROSEMIDE 10 MG/ML IJ SOLN
INTRAMUSCULAR | Status: AC
  Filled 2016-03-19: qty 4

## 2016-03-19 MED ORDER — CEFUROXIME AXETIL 500 MG PO TABS: 500.0000 mg | ORAL_TABLET | Freq: Two times a day (BID) | ORAL | Status: DC

## 2016-03-19 MED ORDER — AZITHROMYCIN 250 MG PO TABS: 250.0000 mg | ORAL_TABLET | Freq: Every day | ORAL | Status: AC

## 2016-03-19 MED ORDER — FAMOTIDINE 10 MG PO TABS: 10.0000 mg | ORAL_TABLET | Freq: Two times a day (BID) | ORAL | Status: DC

## 2016-03-19 MED ORDER — ALBUTEROL SULFATE (2.5 MG/3ML) 0.083% IN NEBU: 2.5000 mg | INHALATION_SOLUTION | Freq: Four times a day (QID) | RESPIRATORY_TRACT | Status: AC | PRN

## 2016-03-19 MED ORDER — ALBUTEROL SULFATE (2.5 MG/3ML) 0.083% IN NEBU: 2.5000 mg | INHALATION_SOLUTION | Freq: Three times a day (TID) | RESPIRATORY_TRACT | Status: DC

## 2016-03-19 MED ORDER — ENSURE ENLIVE PO LIQD: 237.0000 mL | Freq: Three times a day (TID) | ORAL | Status: DC

## 2016-03-19 NOTE — Progress Notes (Addendum)
Pharmacy Antibiotic Follow-up Note  Daniel [name] is a 82 y.o. year-old male with hx lymphoma admitted on 03/14/2016 with c/o SOB.  Patient's currently on abx day #5 for PNA.  - afeb, wbc low, scr trending down 0.92(crcl~42)   Assessment/Plan: - continue vancomycin 500 mg IV q24h.  Will check vancomycin trough level tonight to assess current regimen. - ceftriaxone/azithromycin per MD  Adden: patient to be discharged today.  Will cancel vancomycin level Dia Sitter, PharmD, BCPS 03/19/2016 3:33 PM  ___________________________   Temp (24hrs), Avg:97.8 F (36.6 C), Min:97.5 F (36.4 C), Max:98 F (36.7 C)   Recent Labs Lab 03/14/16 1733 03/16/16 0544 03/17/16 0523 03/18/16 0837  WBC 3.5* 2.9* 2.5* 2.3*     Recent Labs Lab 03/14/16 1733 03/16/16 0544 03/17/16 0523 03/18/16 0837  CREATININE 1.58* 1.35* 1.01 0.92   Estimated Creatinine Clearance: 42.6 mL/min (by C-G formula based on Cr of 0.92).    No Known Allergies  Antimicrobials this admission: 4/6 Rocephin >>  4/6 Azithromycin >>  4/6 Vancomycin >>  Levels/dose changes this admission: N/a   Microbiology results: 4/6 BCx x2: ngtd  Thank you for allowing pharmacy to be a part of this patient's care.  Dia Sitter, PharmD, BCPS 03/19/2016 9:55 AM

## 2016-03-19 NOTE — Progress Notes (Signed)
Subjective: Acute resp failure: secondary to pneumonia Pt on abx D#5,  Improved.  Slight dry cough.  Denies fever, chills, cp, palp,  Renal insufficiency: improved with hydration.   Dr. Luberta Robertson is going to arrange renogram for hydronephrosis, appreciate his input.    Protein calorie malnutrition On ensure  Gerd/ chronic cough: on pepcid  Lymphoma/ pancytopenia imbrutinib on hold, blood counts stable Objective: Vital signs in last 24 hours: Temp:  [97.5 F (36.4 C)-98 F (36.7 C)] 97.9 F (36.6 C) (04/11 0523) Pulse Rate:  [72-88] 80 (04/11 0523) Resp:  [16-20] 16 (04/11 0523) BP: (101-107)/(47-54) 107/54 mmHg (04/11 0523) SpO2:  [95 %-100 %] 95 % (04/11 0523) Weight:  [61.4 kg (135 lb 5.8 oz)] 61.4 kg (135 lb 5.8 oz) (04/11 0523) Weight change: 3.2 kg (7 lb 0.9 oz) Last BM Date: 03/18/16  Intake/Output from previous day: 04/10 0701 - 04/11 0700 In: 300 [IV Piggyback:300] Out: 1325 [Urine:1325] Intake/Output this shift: Total I/O In: -  Out: 825 [Urine:825]  Gen: axox3 Heent: anicteric Neck: no jvd Heart: rrr s1, s2 Lung , slight crackle left lung base, no wheeze Abd: soft Ext: no c/c/e  Lab Results:  Recent Labs  03/17/16 0523 03/18/16 0837  WBC 2.5* 2.3*  HGB 9.8* 10.4*  HCT 30.5* 32.8*  PLT 78* 83*   BMET  Recent Labs  03/17/16 0523 03/18/16 0837  NA 134* 139  K 4.8 4.4  CL 102 101  CO2 25 29  GLUCOSE 101* 121*  BUN 33* 31*  CREATININE 1.01 0.92  CALCIUM 7.8* 8.2*    Studies/Results: No results found.  Medications: I have reviewed the patient's current medications.  Assessment/Plan: Acute respiratory failure Secondary to pnemonia Cont vanco, rocephin, zithromax D#5  Renal insufficiency Appreciate Dr. Luberta Robertson ordering renogram  Protein calorie malnutrition,  Cont ensure   Gerd/ chronic cough Cont pepcid   Pancytopenia Stable  Lymphoma Restart imbrutinib tomorrow  Dispo: ? [facility], FL2 signed  DVT prophylaxis:  scd, lovenox    LOS: 5 days   [name] 03/19/2016, 6:45 AM

## 2016-03-19 NOTE — Clinical Social Work Placement (Signed)
Patient is set to discharge to [facility] today. Patient & [name] made aware. Discharge packet given to RN, Clell Bellow. [facility] called for transport.     Raynaldo Opitz, [facility] Clinical Social Worker cell #: [phone]    CLINICAL SOCIAL WORK PLACEMENT  NOTE  Date:  03/19/2016  Patient Details  Name: Daniel [name] MRN: [id number] Date of Birth: [birth year]  Clinical Social Work is seeking post-discharge placement for this patient at the [place] level of care (*CSW will initial, date and re-position this form in  chart as items are completed):  Yes   Patient/family provided with [facility] Work Department's list of facilities offering this level of care within the geographic area requested by the patient (or if unable, by the patient's family).  Yes   Patient/family informed of their freedom to choose among providers that offer the needed level of care, that participate in Medicare, Medicaid or managed care program needed by the patient, have an available bed and are willing to accept the patient.  Yes   Patient/family informed of [facility]'s ownership interest in [facility] and [facility], as well as of the fact that they are under no obligation to receive care at these facilities.  PASRR submitted to EDS on 03/18/16     PASRR number received on 03/18/16     Existing PASRR number confirmed on       FL2 transmitted to all facilities in geographic area requested by pt/family on 03/18/16     FL2 transmitted to all facilities within larger geographic area on 03/18/16     Patient informed that his/her managed care company has contracts with or will negotiate with certain facilities, including the following:        Yes   Patient/family informed of bed offers received.  Patient chooses bed at [facility]     Physician recommends and patient chooses bed at      Patient to be  transferred to [facility] on 03/19/16.  Patient to be transferred to facility by [facility]     Patient family notified on 03/19/16 of transfer.  Name of family member notified:  patient's [name] via phone     PHYSICIAN       Additional Comment:    _______________________________________________ Standley Brooking, LCSW 03/19/2016, 3:08 PM

## 2016-03-19 NOTE — Discharge Summary (Signed)
Physician Discharge Summary  Patient ID: Daniel [name] MRN: [id number] DOB/AGE: 80-Aug-1920 80 y.o.  Admit date: 03/14/2016 Discharge date: 03/19/2016  Admission Diagnoses:   Acute respiratory failure/ Hypoxemia Pneumonia Ckd stage 3,   Protein calorie malnutrition Thrombocytopenia/ Anemia Lymphoma   Discharge Diagnoses:  Active Problems:   Lymphoma, small lymphocytic (HCC)   Hydroureteronephrosis   Chronic kidney disease (CKD), stage III (moderate)   Thrombocytopenia (HCC)   Protein-calorie malnutrition, moderate (HCC)   Hypoxia   Protein-calorie malnutrition, severe   Acute respiratory failure ([place]) secondary to pneumonia Gerd  Discharged Condition: stable  Hospital Course: 82 yo male with hx of lymphoma, thrombocytopenia, ckd stage3, hydroureteronephrosis,  Protein calorie malnutrition presented to office on 03/13/2016 with c/o cough, w yellow sputum, and tx with levaquin. Pt was not improving and was lethargic so seen on 03/14/2016 and found to be hypoxic w pox in the low 80's.  Pt admitted to hospital CXR showed ? Infiltrate in theleft lower lung .  Pt started on vanco iv, rocephin iv, and zithromax iv for cap.  Pt was tachycardic initially and this resolved with o2  and abx.  VQ scan negative for PE.  Pt continued to improved with abx.  ckd stable,  Improved.  Dr. Jeffie Pollock requested renogram which is being done today.  Pt will f/u with Dr. Jeffie Pollock as outpatient.  Protein calorie malnutrition with albumin 2.0 improved with ensure.  Pt had his lymphoma medication held while being treated for infection as per pharmacy rec.  Can restart this.   Pancytopenia: stable.  Pt appears to be stable from respiratory standpoint and will be discharged to [facility] for deconditioning secondary to acute resp failure secondary to pneumonia.     Consults: None  Significant Diagnostic Studies: CXR ? Left lower lung infiltrate,  VQ scan negative  Treatments: antibiotics: Vanco iv, Rocephin iv,  zithromax iv  Discharge Exam: Blood pressure 107/54, pulse 84, temperature 97.9 F (36.6 C), temperature source Oral, resp. rate 17, height 5\' 6"  (1.676 m), weight 61.236 kg (135 lb), SpO2 91 %. Heent: anicteric Neck: no jvd Heart: rrr s1, s2 Lung: ctab Abd: soft Ext: no c/c/ trace edema Skin: no rash Lymph:  No adenopathy Neuro: deconditioned.    A/P Acute resp failure secondary to pneumonia Cefuroxime 500mg  po bid, x 2 days,  Zithromax 250mg  po qday x 1 days Cont albuterol neb 1 neb po tid and q6h prn wheezing, sob.   CKD stage3   Please check cmp in 5 days.   Pancytopenia secondary to lymphoma Check cbc in 5 days.   LYmphoma  Restart imbrutinib   Protein calorie malnutrition Cont ensure 1 can po tid  Deconditioning  PT to evaluate and tx,     Disposition: SNF     Medication List    STOP taking these medications        levofloxacin 750 MG tablet  Commonly known as:  LEVAQUIN      TAKE these medications        albuterol (2.5 MG/3ML) 0.083% nebulizer solution  Commonly known as:  PROVENTIL  Take 3 mLs (2.5 mg total) by nebulization 3 (three) times daily.     albuterol (2.5 MG/3ML) 0.083% nebulizer solution  Commonly known as:  PROVENTIL  Take 3 mLs (2.5 mg total) by nebulization every 6 (six) hours as needed for wheezing or shortness of breath.     azithromycin 250 MG tablet  Commonly known as:  ZITHROMAX  Take 1 tablet (250 mg total) by  mouth daily.     cefUROXime 500 MG tablet  Commonly known as:  CEFTIN  Take 1 tablet (500 mg total) by mouth 2 (two) times daily with a meal.     famotidine 10 MG tablet  Commonly known as:  PEPCID  Take 1 tablet (10 mg total) by mouth 2 (two) times daily.     feeding supplement (ENSURE ENLIVE) Liqd  Take 237 mLs by mouth 3 (three) times daily between meals.     finasteride 5 MG tablet  Commonly known as:  PROSCAR  Take 5 mg by mouth daily with breakfast.     fish oil-omega-3 fatty acids 1000 MG capsule   Take 1 g by mouth daily with breakfast.     GENTEAL OP  Place 1 drop into both eyes 2 (two) times daily as needed. Dry eyes     glucosamine-chondroitin 500-400 MG tablet  Take 1 tablet by mouth daily.     ibrutinib 140 MG capsul  Commonly known as:  IMBRUVICA  Take 3 capsules (420 mg total) by mouth daily.     ICAPS MV PO  Take 1 tablet by mouth daily.       Follow-up Information    Follow up with [facility].   Specialty:  [place] information:   [place] [phone]      Follow up with [name], MD In 1 week.   Specialty:  Internal Medicine   Why:  pneumonia   Contact information:   [place] [address] [phone]       Signed: [name] 03/19/2016, 1:42 PM

## 2016-04-07 ENCOUNTER — Encounter (HOSPITAL_COMMUNITY): Payer: Self-pay | Admitting: Emergency Medicine

## 2016-04-07 ENCOUNTER — Emergency Department (HOSPITAL_COMMUNITY)
Admission: EM | Admit: 2016-04-07 | Discharge: 2016-04-08 | Disposition: A | Discharge status: Home / Self Care | Payer: Medicare Other | Attending: Emergency Medicine | Admitting: Emergency Medicine

## 2016-04-07 DIAGNOSIS — T83098A Other mechanical complication of other indwelling urethral catheter, initial encounter: Secondary | ICD-10-CM | POA: Diagnosis not present

## 2016-04-07 DIAGNOSIS — Z87442 Personal history of urinary calculi: Secondary | ICD-10-CM | POA: Diagnosis not present

## 2016-04-07 DIAGNOSIS — N401 Enlarged prostate with lower urinary tract symptoms: Secondary | ICD-10-CM | POA: Diagnosis not present

## 2016-04-07 DIAGNOSIS — Z8719 Personal history of other diseases of the digestive system: Secondary | ICD-10-CM | POA: Diagnosis not present

## 2016-04-07 DIAGNOSIS — Z8572 Personal history of non-Hodgkin lymphomas: Secondary | ICD-10-CM | POA: Diagnosis not present

## 2016-04-07 DIAGNOSIS — Z87448 Personal history of other diseases of urinary system: Secondary | ICD-10-CM | POA: Diagnosis not present

## 2016-04-07 DIAGNOSIS — Q7649 Other congenital malformations of spine, not associated with scoliosis: Secondary | ICD-10-CM | POA: Insufficient documentation

## 2016-04-07 DIAGNOSIS — Y658 Other specified misadventures during surgical and medical care: Secondary | ICD-10-CM | POA: Diagnosis not present

## 2016-04-07 DIAGNOSIS — H9190 Unspecified hearing loss, unspecified ear: Secondary | ICD-10-CM | POA: Diagnosis not present

## 2016-04-07 DIAGNOSIS — R339 Retention of urine, unspecified: Secondary | ICD-10-CM | POA: Diagnosis present

## 2016-04-07 DIAGNOSIS — T839XXA Unspecified complication of genitourinary prosthetic device, implant and graft, initial encounter: Secondary | ICD-10-CM

## 2016-04-07 LAB — URINALYSIS, ROUTINE W REFLEX MICROSCOPIC
BILIRUBIN URINE: NEGATIVE
Glucose, UA: NEGATIVE mg/dL
Ketones, ur: NEGATIVE mg/dL
NITRITE: POSITIVE — AB
Specific Gravity, Urine: 1.016 (ref 1.005–1.030)
pH: 6.5 (ref 5.0–8.0)

## 2016-04-07 LAB — URINE MICROSCOPIC-ADD ON

## 2016-04-07 MED ORDER — CIPROFLOXACIN HCL 500 MG PO TABS: 500.0000 mg | ORAL_TABLET | Freq: Two times a day (BID) | ORAL | Status: DC

## 2016-04-07 MED ORDER — LIDOCAINE HCL 2 % EX GEL
1.0000 "application " | Freq: Once | CUTANEOUS | Status: AC
  Administered 2016-04-07: 1 via URETHRAL
  Filled 2016-04-07: qty 11

## 2016-04-07 NOTE — ED Provider Notes (Signed)
CSN: [id number]     Arrival date & time 04/07/16  2219 History   First MD Initiated Contact with Patient 04/07/16 2222     Chief Complaint  Patient presents with  . Urinary Retention     (Consider location/radiation/quality/duration/timing/severity/associated sxs/prior Treatment) Patient is a 82 y.o. male presenting with male genitourinary complaint. The history is provided by the patient and the nursing home.  Male GU Problem Presenting symptoms comment:  Foley catheter came out Context: spontaneously   Context comment:  Foley catheter was removed at nursing facility and they were unable to replace. Pt having some bleeding into foley bag and apparent pyuria Relieved by:  Nothing Worsened by:  Nothing tried Ineffective treatments:  None tried Associated symptoms: no fever, no urinary frequency, no urinary incontinence, no urinary retention and no vomiting   Risk factors: urinary catheter     Past Medical History  Diagnosis Date  . HOH (hard of hearing)   . Diverticulosis   . BPH (benign prostatic hyperplasia)   . Congenital malformation of spine     T12  . Hx of hydronephrosis   . History of kidney stones 2013  . Cough for last 2 years    occasional white sputum  . Sleep apnea   . Prostate enlargement   . Hydronephrosis 10/27/2014  . NHL (non-Hodgkin's lymphoma) ([place])     nhl dx 5/09;  . Non-Hodgkin lymphoma [facility])    Past Surgical History  Procedure Laterality Date  . Prostate surgery  yrs ago  . Eye surgery  yrs ago    Implants bilateral  . Inguinal hernia repair  yrs ago    left  . Cholecystectomy  yrs ago  . Esophagogastroduodenoscopy (egd) with propofol N/A 07/15/2013    Procedure: ESOPHAGOGASTRODUODENOSCOPY (EGD) WITH PROPOFOL;  Surgeon: Milus Banister, MD;  Location: [facility];  Service: Endoscopy;  Laterality: N/A;  botox injection  . Botox injection N/A 07/15/2013    Procedure: BOTOX INJECTION;  Surgeon: Milus Banister, MD;  Location: [facility];   Service: Endoscopy;  Laterality: N/A;   Family History  Problem Relation Age of Onset  . Tuberculosis Mother   . Blindness Father    Social History  Substance Use Topics  . Smoking status: Former Smoker -- 0.25 packs/day for 3 years    Types: Pipe    Quit date: 12/10/1959  . Smokeless tobacco: Never Used     Comment: Pt states that he never inhaled. Pt also states that while in service he smoked cigs x 2 months.   . Alcohol Use: 0.6 oz/week    1 Glasses of wine per week     Comment: occasional    Review of Systems  Constitutional: Negative for fever.  Gastrointestinal: Negative for vomiting.  Genitourinary: Negative for bladder incontinence and frequency.  All other systems reviewed and are negative.     Allergies  Review of patient's allergies indicates no known allergies.  Home Medications   Prior to Admission medications   Medication Sig Start Date End Date Taking? Authorizing Provider  albuterol (PROVENTIL) (2.5 MG/3ML) 0.083% nebulizer solution Take 3 mLs (2.5 mg total) by nebulization 3 (three) times daily. 03/19/16   [name], MD  albuterol (PROVENTIL) (2.5 MG/3ML) 0.083% nebulizer solution Take 3 mLs (2.5 mg total) by nebulization every 6 (six) hours as needed for wheezing or shortness of breath. 03/19/16   [name], MD  cefUROXime (CEFTIN) 500 MG tablet Take 1 tablet (500 mg total) by mouth 2 (two) times  daily with a meal. 03/19/16   [name], MD  famotidine (PEPCID) 10 MG tablet Take 1 tablet (10 mg total) by mouth 2 (two) times daily. 03/19/16   [name], MD  feeding supplement, ENSURE ENLIVE, (ENSURE ENLIVE) LIQD Take 237 mLs by mouth 3 (three) times daily between meals. 03/19/16   [name], MD  finasteride (PROSCAR) 5 MG tablet Take 5 mg by mouth daily with breakfast.     Historical Provider, MD  fish oil-omega-3 fatty acids 1000 MG capsule Take 1 g by mouth daily with breakfast.     Historical Provider, MD  glucosamine-chondroitin 500-400 MG tablet Take 1  tablet by mouth daily.     Historical Provider, MD  Hypromellose (GENTEAL OP) Place 1 drop into both eyes 2 (two) times daily as needed. Dry eyes    Historical Provider, MD  ibrutinib (IMBRUVICA) 140 MG capsul Take 3 capsules (420 mg total) by mouth daily. 11/14/14   Heath Lark, MD  Multiple Vitamins-Minerals (ICAPS MV PO) Take 1 tablet by mouth daily.    Historical Provider, MD   BP 112/59 mmHg  Pulse 88  Temp(Src) 98.3 F (36.8 C) (Oral)  Resp 16  Ht 5\' 5"  (1.651 m)  Wt 135 lb (61.236 kg)  BMI 22.47 kg/m2  SpO2 93% Physical Exam  Constitutional: He is oriented to person, place, and time. He appears well-developed and well-nourished. No distress.  HENT:  Head: Normocephalic and atraumatic.  Eyes: Conjunctivae are normal.  Neck: Neck supple. No tracheal deviation present.  Cardiovascular: Normal rate, regular rhythm and normal heart sounds.   Pulmonary/Chest: Effort normal and breath sounds normal. No respiratory distress.  Abdominal: Soft. He exhibits no distension. There is no tenderness. There is no rebound and no guarding.  Neurological: He is alert and oriented to person, place, and time.  Skin: Skin is warm and dry.  Psychiatric: He has a normal mood and affect.  Vitals reviewed.   ED Course  Procedures (including critical care time) Labs Review Labs Reviewed  URINALYSIS, ROUTINE W REFLEX MICROSCOPIC (NOT AT [facility]) - Abnormal; Notable for the following:    Color, Urine BROWN (*)    APPearance TURBID (*)    Hgb urine dipstick LARGE (*)    Protein, ur >300 (*)    Nitrite POSITIVE (*)    Leukocytes, UA LARGE (*)    All other components within normal limits  URINE MICROSCOPIC-ADD ON - Abnormal; Notable for the following:    Squamous Epithelial / LPF FIELD OBSCURED BY RBC'S (*)    Bacteria, UA FIELD OBSCURED BY RBC'S (*)    All other components within normal limits  URINE CULTURE    Imaging Review No results found. I have personally reviewed and evaluated these images  and lab results as part of my medical decision-making.   EKG Interpretation None      MDM   Final diagnoses:  Urinary catheter complication, initial encounter [facility])    82 y.o. male presents with foley removal by nursing facility staff. Per Pt who is cognitively very high functioning and very pleasant, he had no complaints with catheter prior to this incident. They were unable to replace at nursing facility. Foley placed here by nursing without difficulty. Urine appears colonized and with this manipulation he will be placed on ABx ppx for infection pending culture and follow up with his urologist. No signs of systemic infection, sepsis, or other complication. Plan to follow up with PCP as needed and return precautions discussed for worsening  or new concerning symptoms.     Leo Grosser, MD 04/08/16 [phone]

## 2016-04-07 NOTE — ED Notes (Signed)
Bed: [id number] Expected date:  Expected time:  Means of arrival:  Comments: 28 M pulled foley out

## 2016-04-07 NOTE — Discharge Instructions (Signed)
Foley Catheter Care, Adult °A Foley catheter is a soft, flexible tube that is placed into the bladder to drain urine. A Foley catheter may be inserted if: °· You leak urine or are not able to control when you urinate (urinary incontinence). °· You are not able to urinate when you need to (urinary retention). °· You had prostate surgery or surgery on the genitals. °· You have certain medical conditions, such as multiple sclerosis, dementia, or a spinal cord injury. °If you are going home with a Foley catheter in place, follow the instructions below. °TAKING CARE OF THE CATHETER °1. Wash your hands with soap and water. °2. Using mild soap and warm water on a clean washcloth: °¨ Clean the area on your body closest to the catheter insertion site using a circular motion, moving away from the catheter. Never wipe toward the catheter because this could sweep bacteria up into the urethra and cause infection. °¨ Remove all traces of soap. Pat the area dry with a clean towel. For males, reposition the foreskin. °3. Attach the catheter to your leg so there is no tension on the catheter. Use adhesive tape or a leg strap. If you are using adhesive tape, remove any sticky residue left behind by the previous tape you used. °4. Keep the drainage bag below the level of the bladder, but keep it off the floor. °5. Check throughout the day to be sure the catheter is working and urine is draining freely. Make sure the tubing does not become kinked. °6. Do not pull on the catheter or try to remove it. Pulling could damage internal tissues. °TAKING CARE OF THE DRAINAGE BAGS °You will be given two drainage bags to take home. One is a large overnight drainage bag, and the other is a smaller leg bag that fits underneath clothing. You may wear the overnight bag at any time, but you should never wear the smaller leg bag at night. Follow the instructions below for how to empty, change, and clean your drainage bags. °Emptying the Drainage  Bag °You must empty your drainage bag when it is  -½ full or at least 2-3 times a day. °1. Wash your hands with soap and water. °2. Keep the drainage bag below your hips, below the level of your bladder. This stops urine from going back into the tubing and into your bladder. °3. Hold the dirty bag over the toilet or a clean container. °4. Open the pour spout at the bottom of the bag and empty the urine into the toilet or container. Do not let the pour spout touch the toilet, container, or any other surface. Doing so can place bacteria on the bag, which can cause an infection. °5. Clean the pour spout with a gauze pad or cotton ball that has rubbing alcohol on it. °6. Close the pour spout. °7. Attach the bag to your leg with adhesive tape or a leg strap. °8. Wash your hands well. °Changing the Drainage Bag °Change your drainage bag once a month or sooner if it starts to smell bad or look dirty. Below are steps to follow when changing the drainage bag. °1. Wash your hands with soap and water. °2. Pinch off the rubber catheter so that urine does not spill out. °3. Disconnect the catheter tube from the drainage tube at the connection valve. Do not let the tubes touch any surface. °4. Clean the end of the catheter tube with an alcohol wipe. Use a different alcohol wipe to clean   the end of the drainage tube. °5. Connect the catheter tube to the drainage tube of the clean drainage bag. °6. Attach the new bag to the leg with adhesive tape or a leg strap. Avoid attaching the new bag too tightly. °7. Wash your hands well. °Cleaning the Drainage Bag °1. Wash your hands with soap and water. °2. Wash the bag in warm, soapy water. °3. Rinse the bag thoroughly with warm water. °4. Fill the bag with a solution of white vinegar and water (1 cup vinegar to 1 qt warm water [.2 L vinegar to 1 L warm water]). Close the bag and soak it for 30 minutes in the solution. °5. Rinse the bag with warm water. °6. Hang the bag to dry with the  pour spout open and hanging downward. °7. Store the clean bag (once it is dry) in a clean plastic bag. °8. Wash your hands well. °PREVENTING INFECTION °· Wash your hands before and after handling your catheter. °· Take showers daily and wash the area where the catheter enters your body. Do not take baths. Replace wet leg straps with dry ones, if this applies. °· Do not use powders, sprays, or lotions on the genital area. Only use creams, lotions, or ointments as directed by your caregiver. °· For females, wipe from front to back after each bowel movement. °· Drink enough fluids to keep your urine clear or pale yellow unless you have a fluid restriction. °· Do not let the drainage bag or tubing touch or lie on the floor. °· Wear cotton underwear to absorb moisture and to keep your skin drier. °SEEK MEDICAL CARE IF:  °· Your urine is cloudy or smells unusually bad. °· Your catheter becomes clogged. °· You are not draining urine into the bag or your bladder feels full. °· Your catheter starts to leak. °SEEK IMMEDIATE MEDICAL CARE IF:  °· You have pain, swelling, redness, or pus where the catheter enters the body. °· You have pain in the abdomen, legs, lower back, or bladder. °· You have a fever. °· You see blood fill the catheter, or your urine is pink or red. °· You have nausea, vomiting, or chills. °· Your catheter gets pulled out. °MAKE SURE YOU:  °· Understand these instructions. °· Will watch your condition. °· Will get help right away if you are not doing well or get worse. °  °This information is not intended to replace advice given to you by your health care provider. Make sure you discuss any questions you have with your health care provider. °  °Document Released: 11/25/2005 Document Revised: 04/11/2014 Document Reviewed: 11/16/2012 °Elsevier Interactive Patient Education ©2016 [employer] ° °

## 2016-04-07 NOTE — ED Notes (Signed)
Report attempted to be called to [facility] several times without success. Discharge information to be given to pt and PTAR upon their arrival.

## 2016-04-07 NOTE — ED Notes (Signed)
Per EMS pt sent from [facility] for having foley catheter removed and staff was unable to replace catheter. Pt alert and oriented x4. Pt reports having used a catheter for the last 8 months and was changed monthly by Urology due to enlarged prostate. Pt states that sometime today catheter was removed and is now having bleeding at penis. Pt states that Urology typically has to use a rod for insertion of catheter.

## 2016-04-08 NOTE — ED Notes (Signed)
[employer] notified of need for transport back to [facility]

## 2016-04-08 NOTE — ED Notes (Signed)
[facility] here for transport of pt back to nursing home

## 2016-04-08 NOTE — ED Notes (Signed)
Pt reports understanding of discharge information. No questions at time of discharge 

## 2016-04-10 LAB — URINE CULTURE: Culture: >=100000 — AB

## 2016-04-11 ENCOUNTER — Telehealth: Payer: Self-pay | Admitting: *Deleted

## 2016-04-11 NOTE — ED Notes (Signed)
Post ED Visit - Positive Culture Follow-up  Culture report reviewed by antimicrobial stewardship pharmacist:  []  Elenor Quinones, Pharm.D. []  Heide Guile, Pharm.D., BCPS []  [name] Neptune, Pharm.D. []  Alycia Rossetti, Pharm.D., BCPS []  [place], Pharm.D., BCPS, AAHIVP []  Legrand Como, Pharm.D., BCPS, AAHIVP [x]  Milus Glazier, Pharm.D. []  Stephens November, Pharm.D.  Positive urine culture Treated with Ciprofloxacin, organism sensitive to the same and no further patient follow-up is required at this time.  Jansen, [facility] 04/11/2016, 10:42 AM

## 2016-04-29 ENCOUNTER — Telehealth: Payer: Self-pay | Admitting: Gastroenterology

## 2016-04-29 NOTE — Telephone Encounter (Signed)
The pt is an old pt of Dr Ardis Hughs who has gotten EGD with botox on the past.  He has started having dysphagia again and is beginning to choke on food.  Pt scheduled with Janett Billow and also advised to chew very well and avoid meats.  He will go to the ED if the swallowing worsens.

## 2016-05-03 ENCOUNTER — Encounter (HOSPITAL_COMMUNITY): Payer: Self-pay | Admitting: Emergency Medicine

## 2016-05-03 ENCOUNTER — Emergency Department (HOSPITAL_COMMUNITY)
Admission: EM | Admit: 2016-05-03 | Discharge: 2016-05-03 | Disposition: A | Discharge status: Home / Self Care | Payer: Medicare Other | Attending: Emergency Medicine | Admitting: Emergency Medicine

## 2016-05-03 ENCOUNTER — Ambulatory Visit: Payer: Medicare Other | Admitting: Gastroenterology

## 2016-05-03 ENCOUNTER — Emergency Department (HOSPITAL_COMMUNITY): Payer: Medicare Other

## 2016-05-03 DIAGNOSIS — Y939 Activity, unspecified: Secondary | ICD-10-CM | POA: Insufficient documentation

## 2016-05-03 DIAGNOSIS — W230XXA Caught, crushed, jammed, or pinched between moving objects, initial encounter: Secondary | ICD-10-CM | POA: Diagnosis not present

## 2016-05-03 DIAGNOSIS — Y999 Unspecified external cause status: Secondary | ICD-10-CM | POA: Insufficient documentation

## 2016-05-03 DIAGNOSIS — Z87891 Personal history of nicotine dependence: Secondary | ICD-10-CM | POA: Diagnosis not present

## 2016-05-03 DIAGNOSIS — S61511A Laceration without foreign body of right wrist, initial encounter: Secondary | ICD-10-CM | POA: Insufficient documentation

## 2016-05-03 DIAGNOSIS — Y929 Unspecified place or not applicable: Secondary | ICD-10-CM | POA: Insufficient documentation

## 2016-05-03 DIAGNOSIS — S51811A Laceration without foreign body of right forearm, initial encounter: Secondary | ICD-10-CM

## 2016-05-03 MED ORDER — CEPHALEXIN 500 MG PO CAPS: 500.0000 mg | ORAL_CAPSULE | Freq: Four times a day (QID) | ORAL | Status: DC

## 2016-05-03 MED ORDER — LIDOCAINE HCL (PF) 1 % IJ SOLN
5.0000 mL | Freq: Once | INTRAMUSCULAR | Status: AC
  Administered 2016-05-03: 5 mL
  Filled 2016-05-03: qty 30

## 2016-05-03 MED ORDER — BACITRACIN ZINC 500 UNIT/GM EX OINT
TOPICAL_OINTMENT | CUTANEOUS | Status: AC
  Administered 2016-05-03: 19:00:00
  Filled 2016-05-03: qty 0.9

## 2016-05-03 NOTE — Discharge Instructions (Signed)
Medications: Keflex  Treatment: Take Keflex as prescribed for 5 days. This time tomorrow, remove dressing and wash with warm soapy water, pat dry, and apply anabiotic ointment. Replace with clean dressing.   Follow-up: In 10-14 days, please return to your doctor or urgent care to have your sutures removed. Signs of infection to include fever, increasing pain, swelling, drainage from the area. These are reasons to call your doctor immediately or return to the emergency department.   Laceration Care, Adult A laceration is a cut that goes through all of the layers of the skin and into the tissue that is right under the skin. Some lacerations heal on their own. Others need to be closed with stitches (sutures), staples, skin adhesive strips, or skin glue. Proper laceration care minimizes the risk of infection and helps the laceration to heal better. HOW TO CARE FOR YOUR LACERATION If sutures or staples were used:  Keep the wound clean and dry.  If you were given a bandage (dressing), you should change it at least one time per day or as told by your health care provider. You should also change it if it becomes wet or dirty.  Keep the wound completely dry for the first 24 hours or as told by your health care provider. After that time, you may shower or bathe. However, make sure that the wound is not soaked in water until after the sutures or staples have been removed.  Clean the wound one time each day or as told by your health care provider:  Wash the wound with soap and water.  Rinse the wound with water to remove all soap.  Pat the wound dry with a clean towel. Do not rub the wound.  After cleaning the wound, apply a thin layer of antibiotic ointmentas told by your health care provider. This will help to prevent infection and keep the dressing from sticking to the wound.  Have the sutures or staples removed as told by your health care provider. If skin adhesive strips were used:  Keep the  wound clean and dry.  If you were given a bandage (dressing), you should change it at least one time per day or as told by your health care provider. You should also change it if it becomes dirty or wet.  Do not get the skin adhesive strips wet. You may shower or bathe, but be careful to keep the wound dry.  If the wound gets wet, pat it dry with a clean towel. Do not rub the wound.  Skin adhesive strips fall off on their own. You may trim the strips as the wound heals. Do not remove skin adhesive strips that are still stuck to the wound. They will fall off in time. If skin glue was used:  Try to keep the wound dry, but you may briefly wet it in the shower or bath. Do not soak the wound in water, such as by swimming.  After you have showered or bathed, gently pat the wound dry with a clean towel. Do not rub the wound.  Do not do any activities that will make you sweat heavily until the skin glue has fallen off on its own.  Do not apply liquid, cream, or ointment medicine to the wound while the skin glue is in place. Using those may loosen the film before the wound has healed.  If you were given a bandage (dressing), you should change it at least one time per day or as told by your  health care provider. You should also change it if it becomes dirty or wet.  If a dressing is placed over the wound, be careful not to apply tape directly over the skin glue. Doing that may cause the glue to be pulled off before the wound has healed.  Do not pick at the glue. The skin glue usually remains in place for 5-10 days, then it falls off of the skin. General Instructions  Take over-the-counter and prescription medicines only as told by your health care provider.  If you were prescribed an antibiotic medicine or ointment, take or apply it as told by your doctor. Do not stop using it even if your condition improves.  To help prevent scarring, make sure to cover your wound with sunscreen whenever you are  outside after stitches are removed, after adhesive strips are removed, or when glue remains in place and the wound is healed. Make sure to wear a sunscreen of at least 30 SPF.  Do not scratch or pick at the wound.  Keep all follow-up visits as told by your health care provider. This is important.  Check your wound every day for signs of infection. Watch for:  Redness, swelling, or pain.  Fluid, blood, or pus.  Raise (elevate) the injured area above the level of your heart while you are sitting or lying down, if possible. SEEK MEDICAL CARE IF:  You received a tetanus shot and you have swelling, severe pain, redness, or bleeding at the injection site.  You have a fever.  A wound that was closed breaks open.  You notice a bad smell coming from your wound or your dressing.  You notice something coming out of the wound, such as wood or glass.  Your pain is not controlled with medicine.  You have increased redness, swelling, or pain at the site of your wound.  You have fluid, blood, or pus coming from your wound.  You notice a change in the color of your skin near your wound.  You need to change the dressing frequently due to fluid, blood, or pus draining from the wound.  You develop a new rash.  You develop numbness around the wound. SEEK IMMEDIATE MEDICAL CARE IF:  You develop severe swelling around the wound.  Your pain suddenly increases and is severe.  You develop painful lumps near the wound or on skin that is anywhere on your body.  You have a red streak going away from your wound.  The wound is on your hand or foot and you cannot properly move a finger or toe.  The wound is on your hand or foot and you notice that your fingers or toes look pale or bluish.   This information is not intended to replace advice given to you by your health care provider. Make sure you discuss any questions you have with your health care provider.   Document Released: 11/25/2005  Document Revised: 04/11/2015 Document Reviewed: 11/21/2014 Elsevier Interactive Patient Education [employer].

## 2016-05-03 NOTE — ED Notes (Signed)
Patty Page will pick up pt when he is ready.  [phone]

## 2016-05-03 NOTE — ED Provider Notes (Signed)
Medical screening examination/treatment/procedure(s) were conducted as a shared visit with non-physician practitioner(s) and myself.  I personally evaluated the patient during the encounter.  Avulsion, skin tear flap laceration.   Skin is very thin.  Tendon visualized at base of wound but it is intact.  Will repair wound to have better coverage for the soft tissues.  Outpatient follow up to check on the wound healing.  Dorie Rank, MD 05/03/16 1800

## 2016-05-03 NOTE — ED Provider Notes (Signed)
CSN: [id number]     Arrival date & time 05/03/16  1438 History   First MD Initiated Contact with Patient 05/03/16 1513     Chief Complaint  Patient presents with  . Fall  . Laceration     (Consider location/radiation/quality/duration/timing/severity/associated sxs/prior Treatment) HPI Comments: Patient is a 82 year old male who presents following a mechanical fall with laceration to his right wrist. Patient states he just started on his walker 2 weeks ago and that he was opening his screen door, which got stuck, patient lost his balance with his walker and put his hand out to catch himself. Patient caught his wrist on the door handle resulting in a laceration. Patient takes bleeding was controlled fairly quickly. Patient went to urgent care course to his home and was told that he needed to come here because the laceration was "down to the tendon." Patient denies hitting his head, or losing consciousness. He denies any dizziness or lightheadedness prior to the fall. Patient also denies numbness, tingling of his right arm or hand. Patient was admitted to the hospital recently for pneumonia and is now on oxygen at home. Patient states he continues to have some mild shortness of breath and a productive cough. Patient denies chest pain, abdominal pain, nausea, vomiting, dysuria. Patient has an indwelling catheter. Tetanus is up-to-date.   Patient is a 82 y.o. male presenting with fall and skin laceration. The history is provided by the patient.  Fall Associated symptoms include coughing. Pertinent negatives include no abdominal pain, chest pain, chills, fever, headaches, nausea, rash, sore throat or vomiting.  Laceration   Past Medical History  Diagnosis Date  . HOH (hard of hearing)   . Diverticulosis   . BPH (benign prostatic hyperplasia)   . Congenital malformation of spine     T12  . Hx of hydronephrosis   . History of kidney stones 2013  . Cough for last 2 years    occasional white  sputum  . Sleep apnea   . Prostate enlargement   . Hydronephrosis 10/27/2014  . NHL (non-Hodgkin's lymphoma) ([place])     nhl dx 5/09;  . Non-Hodgkin lymphoma [facility])    Past Surgical History  Procedure Laterality Date  . Prostate surgery  yrs ago  . Eye surgery  yrs ago    Implants bilateral  . Inguinal hernia repair  yrs ago    left  . Cholecystectomy  yrs ago  . Esophagogastroduodenoscopy (egd) with propofol N/A 07/15/2013    Procedure: ESOPHAGOGASTRODUODENOSCOPY (EGD) WITH PROPOFOL;  Surgeon: Milus Banister, MD;  Location: [facility] ENDOSCOPY;  Service: Endoscopy;  Laterality: N/A;  botox injection  . Botox injection N/A 07/15/2013    Procedure: BOTOX INJECTION;  Surgeon: Milus Banister, MD;  Location: [facility] ENDOSCOPY;  Service: Endoscopy;  Laterality: N/A;   Family History  Problem Relation Age of Onset  . Tuberculosis Mother   . Blindness Father    Social History  Substance Use Topics  . Smoking status: Former Smoker -- 0.25 packs/day for 3 years    Types: Pipe    Quit date: 12/10/1959  . Smokeless tobacco: Never Used     Comment: Pt states that he never inhaled. Pt also states that while in service he smoked cigs x 2 months.   . Alcohol Use: 0.6 oz/week    1 Glasses of wine per week     Comment: occasional    Review of Systems  Constitutional: Negative for fever and chills.  HENT: Negative for facial swelling  and sore throat.   Respiratory: Positive for cough and shortness of breath.   Cardiovascular: Negative for chest pain.  Gastrointestinal: Negative for nausea, vomiting and abdominal pain.  Genitourinary: Negative for dysuria.  Musculoskeletal: Negative for back pain.  Skin: Positive for wound. Negative for rash.  Neurological: Negative for headaches.  Psychiatric/Behavioral: The patient is not nervous/anxious.       Allergies  Review of patient's allergies indicates no known allergies.  Home Medications   Prior to Admission medications   Medication Sig Start Date  End Date Taking? Authorizing Provider  albuterol (PROVENTIL) (2.5 MG/3ML) 0.083% nebulizer solution Take 3 mLs (2.5 mg total) by nebulization 3 (three) times daily. 03/19/16   [name], MD  albuterol (PROVENTIL) (2.5 MG/3ML) 0.083% nebulizer solution Take 3 mLs (2.5 mg total) by nebulization every 6 (six) hours as needed for wheezing or shortness of breath. 03/19/16   [name], MD  cefUROXime (CEFTIN) 500 MG tablet Take 1 tablet (500 mg total) by mouth 2 (two) times daily with a meal. Patient not taking: Reported on 05/03/2016 03/19/16   [name], MD  cephALEXin (KEFLEX) 500 MG capsule Take 1 capsule (500 mg total) by mouth 4 (four) times daily. 05/03/16   Frederica Kuster, PA-C  ciprofloxacin (CIPRO) 500 MG tablet Take 1 tablet (500 mg total) by mouth 2 (two) times daily. Patient not taking: Reported on 05/03/2016 04/07/16   Leo Grosser, MD  famotidine (PEPCID) 10 MG tablet Take 1 tablet (10 mg total) by mouth 2 (two) times daily. 03/19/16   [name], MD  feeding supplement, ENSURE ENLIVE, (ENSURE ENLIVE) LIQD Take 237 mLs by mouth 3 (three) times daily between meals. 03/19/16   [name], MD  finasteride (PROSCAR) 5 MG tablet Take 5 mg by mouth daily with breakfast.     Historical Provider, MD  fish oil-omega-3 fatty acids 1000 MG capsule Take 1 g by mouth daily with breakfast.     Historical Provider, MD  glucosamine-chondroitin 500-400 MG tablet Take 1 tablet by mouth daily.     Historical Provider, MD  Hypromellose (GENTEAL OP) Place 1 drop into both eyes 2 (two) times daily as needed. Dry eyes    Historical Provider, MD  ibrutinib (IMBRUVICA) 140 MG capsul Take 3 capsules (420 mg total) by mouth daily. 11/14/14   Heath Lark, MD  Multiple Vitamins-Minerals (ICAPS MV PO) Take 1 tablet by mouth daily.    Historical Provider, MD   BP 129/78 mmHg  Pulse 78  Temp(Src) 97.9 F (36.6 C) (Oral)  Resp 16  Ht 5\' 5"  (1.651 m)  Wt 61.236 kg  BMI 22.47 kg/m2  SpO2 92% Physical Exam  Constitutional: He  appears well-developed and well-nourished. No distress.  HENT:  Head: Normocephalic and atraumatic.  Mouth/Throat: Oropharynx is clear and moist. No oropharyngeal exudate.  Eyes: Conjunctivae are normal. Pupils are equal, round, and reactive to light. Right eye exhibits no discharge. Left eye exhibits no discharge. No scleral icterus.  Neck: Normal range of motion. Neck supple. No thyromegaly present.  Cardiovascular: Normal rate, regular rhythm, normal heart sounds and intact distal pulses.  Exam reveals no gallop and no friction rub.   No murmur heard. Pulmonary/Chest: Effort normal. No stridor. No respiratory distress. He has wheezes. He has rhonchi (diffuse).  Abdominal: Soft. Bowel sounds are normal. He exhibits no distension. There is no tenderness. There is no rebound and no guarding.  Musculoskeletal: He exhibits edema (bilateral pitting edema patient states is chronic for 5 years).  Right wrist: He exhibits laceration. He exhibits normal range of motion.       Right hand: He exhibits normal range of motion and normal capillary refill. Normal sensation noted. Normal strength noted. He exhibits no finger abduction, no thumb/finger opposition and no wrist extension trouble.  ~10 cm laceration and skin tear exposing tendon to right anterior wrist; equal grip strength, normal sensation, full finger flexion and extension intact; cap refill <2secs; radial pulses intact  Lymphadenopathy:    He has no cervical adenopathy.  Neurological: He is alert. Coordination normal.  Skin: Skin is warm and dry. No rash noted. He is not diaphoretic. No pallor.  Psychiatric: He has a normal mood and affect.  Nursing note and vitals reviewed.   ED Course  .Marland KitchenLaceration Repair Date/Time: 05/03/2016 8:53 PM Performed by: Frederica Kuster Authorized by: Frederica Kuster Consent: Verbal consent obtained. Consent given by: patient Patient identity confirmed: verbally with patient Body area: upper  extremity Location details: right lower arm Laceration length: 10 cm Foreign bodies: no foreign bodies Tendon involvement: none Nerve involvement: none Vascular damage: no Anesthesia: local infiltration Local anesthetic: lidocaine 1% without epinephrine Anesthetic total: 3 ml Patient sedated: no Irrigation solution: saline Irrigation method: syringe Amount of cleaning: standard Debridement: none Degree of undermining: none Skin closure: Steri-Strips and Ethilon (5-0,4-0 ethilon) Number of sutures: 24 Technique: simple Approximation: loose Approximation difficulty: complex Dressing: gauze roll, antibiotic ointment and 4x4 sterile gauze Comments: Assisted by Carlisle Cater, PA-C   (including critical care time) Labs Review Labs Reviewed - No data to display  Imaging Review Dg Wrist Complete Right  05/03/2016  CLINICAL DATA:  Fall today with right wrist pain. EXAM: RIGHT WRIST - COMPLETE 3+ VIEW COMPARISON:  None. FINDINGS: There are degenerative changes over the distal radial ulnar joint, carpal bones and first carpometacarpal joints. Mild degenerative change of the radiocarpal joint. Widening of the scapholunate joint space. No acute fracture or dislocation. IMPRESSION: No acute fracture. Moderate degenerative changes over the wrist. Widened scapholunate joint space suggesting carpal instability from ligamentous injury. Electronically Signed   By: Marin Olp M.D.   On: 05/03/2016 16:00   I have personally reviewed and evaluated these images and lab results as part of my medical decision-making.   EKG Interpretation None      MDM   X-ray shows no acute fracture. Tetanus UTD. Laceration occurred < 12 hours prior to repair. Laceration visualized in a bloodless field, tendons intact, no foreign bodies visualized. Discussed laceration care with pt and answered questions. Due to patient's chemotherapy treatment for non-Hodgkin's lymphoma, patient started on prophylactic Keflex. Pt  to f-u for suture removal in 10-14 days and wound check sooner should there be signs of dehiscence or infection. Pt is hemodynamically stable with no complaints prior to dc.  Patient also evaluated by Dr. Tomi Bamberger who is in agreement with plan. Patient vitals stable throughout ED course and discharged in satisfactory condition.   Final diagnoses:  Laceration of right forearm, initial encounter       Frederica Kuster, PA-C 05/03/16 2059  Dorie Rank, MD 05/04/16 1332

## 2016-05-03 NOTE — ED Notes (Signed)
PA at bedside.

## 2016-05-03 NOTE — ED Notes (Signed)
Pt reports he fell and hit wrist on the door handle. Went to an UC around the corner from where he lived, but he was told to come to the ED because it was down to the tendon. Not on blood thinners. Last tetanus last than 10 years ago.

## 2016-06-06 ENCOUNTER — Ambulatory Visit (HOSPITAL_BASED_OUTPATIENT_CLINIC_OR_DEPARTMENT_OTHER): Payer: Medicare Other

## 2016-06-06 ENCOUNTER — Encounter: Payer: Self-pay | Admitting: Hematology and Oncology

## 2016-06-06 ENCOUNTER — Telehealth: Payer: Self-pay | Admitting: Hematology and Oncology

## 2016-06-06 ENCOUNTER — Other Ambulatory Visit: Payer: Self-pay | Admitting: Hematology and Oncology

## 2016-06-06 ENCOUNTER — Ambulatory Visit (HOSPITAL_BASED_OUTPATIENT_CLINIC_OR_DEPARTMENT_OTHER): Payer: Medicare Other | Admitting: Hematology and Oncology

## 2016-06-06 VITALS — BP 124/54 | HR 92 | Temp 97.9°F | Resp 19 | Ht 65.0 in | Wt 125.7 lb

## 2016-06-06 DIAGNOSIS — D630 Anemia in neoplastic disease: Secondary | ICD-10-CM | POA: Diagnosis not present

## 2016-06-06 DIAGNOSIS — C8303 Small cell B-cell lymphoma, intra-abdominal lymph nodes: Secondary | ICD-10-CM | POA: Diagnosis present

## 2016-06-06 DIAGNOSIS — E538 Deficiency of other specified B group vitamins: Secondary | ICD-10-CM

## 2016-06-06 DIAGNOSIS — N183 Chronic kidney disease, stage 3 unspecified: Secondary | ICD-10-CM

## 2016-06-06 DIAGNOSIS — E440 Moderate protein-calorie malnutrition: Secondary | ICD-10-CM | POA: Diagnosis not present

## 2016-06-06 DIAGNOSIS — D61818 Other pancytopenia: Secondary | ICD-10-CM

## 2016-06-06 DIAGNOSIS — R600 Localized edema: Secondary | ICD-10-CM | POA: Diagnosis not present

## 2016-06-06 DIAGNOSIS — C8300 Small cell B-cell lymphoma, unspecified site: Secondary | ICD-10-CM

## 2016-06-06 HISTORY — DX: Deficiency of other specified B group vitamins: E53.8

## 2016-06-06 LAB — CHCC SMEAR

## 2016-06-06 LAB — COMPREHENSIVE METABOLIC PANEL
ALBUMIN: 2.8 g/dL — AB (ref 3.5–5.0)
ALK PHOS: 143 U/L (ref 40–150)
ALT: 12 U/L (ref 0–55)
ANION GAP: 12 meq/L — AB (ref 3–11)
AST: 17 U/L (ref 5–34)
BUN: 42.1 mg/dL — AB (ref 7.0–26.0)
CALCIUM: 9.4 mg/dL (ref 8.4–10.4)
CO2: 27 mEq/L (ref 22–29)
CREATININE: 2 mg/dL — AB (ref 0.7–1.3)
Chloride: 97 mEq/L — ABNORMAL LOW (ref 98–109)
EGFR: 28 mL/min/{1.73_m2} — ABNORMAL LOW (ref >90)
Glucose: 93 mg/dl (ref 70–140)
POTASSIUM: 4 meq/L (ref 3.5–5.1)
Sodium: 136 mEq/L (ref 136–145)
Total Bilirubin: 1.04 mg/dL (ref 0.20–1.20)
Total Protein: 6 g/dL — ABNORMAL LOW (ref 6.4–8.3)

## 2016-06-06 LAB — LACTATE DEHYDROGENASE: LDH: 253 U/L — AB (ref 125–245)

## 2016-06-06 LAB — CBC WITH DIFFERENTIAL/PLATELET
BASO%: 0.3 % (ref 0.0–2.0)
BASOS ABS: 0 10*3/uL (ref 0.0–0.1)
EOS ABS: 0 10*3/uL (ref 0.0–0.5)
EOS%: 0 % (ref 0.0–7.0)
HEMATOCRIT: 40.5 % (ref 38.4–49.9)
HEMOGLOBIN: 12.8 g/dL — AB (ref 13.0–17.1)
LYMPH#: 1.8 10*3/uL (ref 0.9–3.3)
LYMPH%: 29.8 % (ref 14.0–49.0)
MCH: 27.8 pg (ref 27.2–33.4)
MCHC: 31.6 g/dL — ABNORMAL LOW (ref 32.0–36.0)
MCV: 88 fL (ref 79.3–98.0)
MONO#: 0.8 10*3/uL (ref 0.1–0.9)
MONO%: 13.7 % (ref 0.0–14.0)
NEUT#: 3.4 10*3/uL (ref 1.5–6.5)
NEUT%: 56.2 % (ref 39.0–75.0)
PLATELETS: 146 10*3/uL (ref 140–400)
RBC: 4.6 10*6/uL (ref 4.20–5.82)
RDW: 15.8 % — AB (ref 11.0–14.6)
WBC: 6 10*3/uL (ref 4.0–10.3)

## 2016-06-06 NOTE — Telephone Encounter (Signed)
Gave patient avs report and appointments for September.  °

## 2016-06-07 ENCOUNTER — Telehealth: Payer: Self-pay | Admitting: *Deleted

## 2016-06-07 ENCOUNTER — Encounter: Payer: Self-pay | Admitting: Hematology and Oncology

## 2016-06-07 DIAGNOSIS — R600 Localized edema: Secondary | ICD-10-CM | POA: Insufficient documentation

## 2016-06-07 LAB — SEDIMENTATION RATE: Sedimentation Rate-Westergren: 7 mm/hr (ref 0–30)

## 2016-06-07 LAB — VITAMIN B12: Vitamin B12: >2000 pg/mL — ABNORMAL HIGH (ref 211–946)

## 2016-06-07 NOTE — Telephone Encounter (Signed)
Pt's Creatinine elevated at 2.0.  Dr. Alvy Bimler instructs for pt to increase fluid intake and hold lasix.  F/u w/ PCP.  Attempted to call pt and no answer.  Called Dr. Julianne Rice office, s/w Langley Gauss.  She will send message to Dr. Julianne Rice nurse.  I faxed over office note and lab results to their office at fax [phone]. Marland Kitchen

## 2016-06-07 NOTE — Assessment & Plan Note (Signed)
He had recent bilateral lower extremity edema likely due to reduced mobility and severe protein calorie malnutrition. Since then, his leg edema has improved. He is placed on diuretic for this reason but he has cause renal failure. I recommend he stop Lasix for the next few days and increased oral fluid intake.

## 2016-06-07 NOTE — Assessment & Plan Note (Signed)
He had recent severe infection and mobility causing significant protein calorie malnutrition. His serum albumin is improving. Recommend increased oral intake as tolerated

## 2016-06-07 NOTE — Assessment & Plan Note (Signed)
He has acute on chronic renal failure due to recent diuretic therapy. I recommend he hold Lasix for the next few days and increase oral fluid intake. He needs urgent follow-up with PCP in the near future for medication review

## 2016-06-07 NOTE — Progress Notes (Signed)
[facility] OFFICE PROGRESS NOTE  Patient Care Team: [name], MD as PCP - General (Internal Medicine) Franchot Gallo, MD as Consulting Physician (Urology)  SUMMARY OF ONCOLOGIC HISTORY:   Lymphoma, small lymphocytic ([place])   11/10/2011 Initial Diagnosis Lymphoma, small lymphocytic   11/09/2014 Pathology Results Ct guided biopsy of abdominal mass was positive for SLL without signs of transformation   11/28/2014 -  Chemotherapy The patient is started on Ibrutinib   02/24/2015 Imaging Repeat PET/CT scan show greater than 50% reduction in the tumor.   11/29/2015 Imaging PET scan showed persistent mass in the abdomen with mild change in size and activity and worsening hydronephrosis   03/05/2016 Imaging PET CT scan showed improvement in size of mass. Left lower lobe nodule is new, favor infection    INTERVAL HISTORY: Please see below for problem oriented charting. The patient has recurrent hospitalization due to pneumonia. Due to significant hydronephrosis and BPH, he has Foley catheter placed The patient has significant weight loss since the last time I saw him The patient is in the process of arranging for assisted living placement due to inability to care for himself The patient denies any recent signs or symptoms of bleeding such as spontaneous epistaxis, hematuria or hematochezia. He is compliant taking his chemotherapy as prescribed He denies new lymphadenopathy He noticed a significant bilateral lower extremity edema. He is undergoing rehabilitation due to profound weakness from recent hospitalizations.  REVIEW OF SYSTEMS:   Constitutional: Denies fevers, chills Eyes: Denies blurriness of vision Ears, nose, mouth, throat, and face: Denies mucositis or sore throat Respiratory: Denies cough, dyspnea or wheezes Cardiovascular: Denies palpitation, chest discomfort  Gastrointestinal:  Denies nausea, heartburn or change in bowel habits Skin: Denies abnormal skin  rashes Lymphatics: Denies new lymphadenopathy  Neurological:Denies numbness, tingling or new weaknesses Behavioral/Psych: Mood is stable, no new changes  All other systems were reviewed with the patient and are negative.  I have reviewed the past medical history, past surgical history, social history and family history with the patient and they are unchanged from previous note.  ALLERGIES:  has No Known Allergies.  MEDICATIONS:  Current Outpatient Prescriptions  Medication Sig Dispense Refill  . albuterol (PROVENTIL) (2.5 MG/3ML) 0.083% nebulizer solution Take 3 mLs (2.5 mg total) by nebulization 3 (three) times daily. 75 mL 12  . albuterol (PROVENTIL) (2.5 MG/3ML) 0.083% nebulizer solution Take 3 mLs (2.5 mg total) by nebulization every 6 (six) hours as needed for wheezing or shortness of breath. 75 mL 12  . famotidine (PEPCID) 10 MG tablet Take 1 tablet (10 mg total) by mouth 2 (two) times daily. 60 tablet 5  . feeding supplement, ENSURE ENLIVE, (ENSURE ENLIVE) LIQD Take 237 mLs by mouth 3 (three) times daily between meals. 237 mL 12  . finasteride (PROSCAR) 5 MG tablet Take 5 mg by mouth daily with breakfast.     . fish oil-omega-3 fatty acids 1000 MG capsule Take 1 g by mouth daily with breakfast.     . FUROSEMIDE PO Take by mouth daily.    Marland Kitchen glucosamine-chondroitin 500-400 MG tablet Take 1 tablet by mouth daily.     . Hypromellose (GENTEAL OP) Place 1 drop into both eyes 2 (two) times daily as needed. Dry eyes    . ibrutinib (IMBRUVICA) 140 MG capsul Take 3 capsules (420 mg total) by mouth daily. 90 capsule 0  . Multiple Vitamins-Minerals (ICAPS MV PO) Take 1 tablet by mouth daily.     No current facility-administered medications for  this visit.    PHYSICAL EXAMINATION: ECOG PERFORMANCE STATUS: 2 - Symptomatic, <50% confined to bed  Filed Vitals:   06/06/16 1505  BP: 124/54  Pulse: 92  Temp: 97.9 F (36.6 C)  Resp: 19   Filed Weights   06/06/16 1505  Weight: 125 lb 11.2  oz (57.017 kg)    GENERAL:alert, no distress and comfortable. He has lost a lot of weight since I saw him and appear quite debilitated. SKIN: skin color, texture, turgor are normal, no rashes or significant lesions. Noticed significant bruising EYES: normal, Conjunctiva are pink and non-injected, sclera clear OROPHARYNX:no exudate, no erythema and lips, buccal mucosa, and tongue normal . Poor dentition is noted NECK: supple, thyroid normal size, non-tender, without nodularity LYMPH:  no palpable lymphadenopathy in the cervical, axillary or inguinal LUNGS: clear to auscultation and percussion with normal breathing effort HEART: regular rate & rhythm and no murmurs with moderate bilateral lower extremity edema ABDOMEN:abdomen soft, non-tender and normal bowel sounds. He does not have palpable abdomen mass. He has Foley catheter in situ  Musculoskeletal:no cyanosis of digits and no clubbing  NEURO: alert & oriented x 3 with fluent speech, no focal motor/sensory deficits  LABORATORY DATA:  I have reviewed the data as listed    Component Value Date/Time   NA 136 06/06/2016 1531   NA 139 03/18/2016 0837   NA 142 05/12/2012 0739   K 4.0 06/06/2016 1531   K 4.4 03/18/2016 0837   K 4.5 05/12/2012 0739   CL 101 03/18/2016 0837   CL 103 04/19/2013 0828   CL 100 05/12/2012 0739   CO2 27 06/06/2016 1531   CO2 29 03/18/2016 0837   CO2 30 05/12/2012 0739   GLUCOSE 93 06/06/2016 1531   GLUCOSE 121* 03/18/2016 0837   GLUCOSE 101* 04/19/2013 0828   GLUCOSE 122* 05/12/2012 0739   BUN 42.1* 06/06/2016 1531   BUN 31* 03/18/2016 0837   BUN 24* 05/12/2012 0739   CREATININE 2.0* 06/06/2016 1531   CREATININE 0.92 03/18/2016 0837   CREATININE 1.6* 05/12/2012 0739   CALCIUM 9.4 06/06/2016 1531   CALCIUM 8.2* 03/18/2016 0837   CALCIUM 8.7 05/12/2012 0739   PROT 6.0* 06/06/2016 1531   PROT 4.3* 03/18/2016 0837   PROT 6.3* 05/12/2012 0739   ALBUMIN 2.8* 06/06/2016 1531   ALBUMIN 2.1* 03/18/2016  0837   ALBUMIN 3.0* 05/12/2012 0739   AST 17 06/06/2016 1531   AST 64* 03/18/2016 0837   AST 22 05/12/2012 0739   ALT 12 06/06/2016 1531   ALT 41 03/18/2016 0837   ALT 27 05/12/2012 0739   ALKPHOS 143 06/06/2016 1531   ALKPHOS 192* 03/18/2016 0837   ALKPHOS 109* 05/12/2012 0739   BILITOT 1.04 06/06/2016 1531   BILITOT 0.5 03/18/2016 0837   BILITOT 0.80 05/12/2012 0739   GFRNONAA >60 03/18/2016 0837   GFRAA >60 03/18/2016 0837    No results found for: SPEP, UPEP  Lab Results  Component Value Date   WBC 6.0 06/06/2016   NEUTROABS 3.4 06/06/2016   HGB 12.8* 06/06/2016   HCT 40.5 06/06/2016   MCV 88.0 06/06/2016   PLT 146 06/06/2016      Chemistry      Component Value Date/Time   NA 136 06/06/2016 1531   NA 139 03/18/2016 0837   NA 142 05/12/2012 0739   K 4.0 06/06/2016 1531   K 4.4 03/18/2016 0837   K 4.5 05/12/2012 0739   CL 101 03/18/2016 0837   CL 103 04/19/2013 [id number]  CL 100 05/12/2012 0739   CO2 27 06/06/2016 1531   CO2 29 03/18/2016 0837   CO2 30 05/12/2012 0739   BUN 42.1* 06/06/2016 1531   BUN 31* 03/18/2016 0837   BUN 24* 05/12/2012 0739   CREATININE 2.0* 06/06/2016 1531   CREATININE 0.92 03/18/2016 0837   CREATININE 1.6* 05/12/2012 0739      Component Value Date/Time   CALCIUM 9.4 06/06/2016 1531   CALCIUM 8.2* 03/18/2016 0837   CALCIUM 8.7 05/12/2012 0739   ALKPHOS 143 06/06/2016 1531   ALKPHOS 192* 03/18/2016 0837   ALKPHOS 109* 05/12/2012 0739   AST 17 06/06/2016 1531   AST 64* 03/18/2016 0837   AST 22 05/12/2012 0739   ALT 12 06/06/2016 1531   ALT 41 03/18/2016 0837   ALT 27 05/12/2012 0739   BILITOT 1.04 06/06/2016 1531   BILITOT 0.5 03/18/2016 0837   BILITOT 0.80 05/12/2012 0739      ASSESSMENT & PLAN:  Lymphoma, small lymphocytic ([facility]) PET CT scan showed stable appearance. I will continue Iburtinib without dosage adjustment for now. I will see him back in 3 months for further assessment.  Anemia in neoplastic disease This is  likely anemia of chronic disease and the underlying bone marrow disease. The patient denies recent history of bleeding such as epistaxis, hematuria or hematochezia. He is asymptomatic from the anemia. We will observe for now    Chronic kidney disease (CKD), stage III (moderate) He has acute on chronic renal failure due to recent diuretic therapy. I recommend he hold Lasix for the next few days and increase oral fluid intake. He needs urgent follow-up with PCP in the near future for medication review  Protein-calorie malnutrition, moderate ([place]) He had recent severe infection and mobility causing significant protein calorie malnutrition. His serum albumin is improving. Recommend increased oral intake as tolerated  Bilateral leg edema He had recent bilateral lower extremity edema likely due to reduced mobility and severe protein calorie malnutrition. Since then, his leg edema has improved. He is placed on diuretic for this reason but he has cause renal failure. I recommend he stop Lasix for the next few days and increased oral fluid intake.   No orders of the defined types were placed in this encounter.   All questions were answered. The patient knows to call the clinic with any problems, questions or concerns. No barriers to learning was detected. I spent 20 minutes counseling the patient face to face. The total time spent in the appointment was 25 minutes and more than 50% was on counseling and review of test results     [facility], [place], MD 06/07/2016 7:59 AM

## 2016-06-07 NOTE — Telephone Encounter (Signed)
Reached pt's Friend, Patty and informed her of elevated kidney results,  Need for pt to hold Furosemide and drink more water.  Labs were faxed to Dr. Maudie Mercury and pt to f/u w/ Dr. Maudie Mercury for further instructions.  She verbalized understanding.

## 2016-06-07 NOTE — Assessment & Plan Note (Signed)
This is likely anemia of chronic disease and the underlying bone marrow disease. The patient denies recent history of bleeding such as epistaxis, hematuria or hematochezia. He is asymptomatic from the anemia. We will observe for now.     

## 2016-06-07 NOTE — Assessment & Plan Note (Signed)
PET CT scan showed stable appearance. I will continue Iburtinib without dosage adjustment for now. I will see him back in 3 months for further assessment.

## 2016-06-14 ENCOUNTER — Encounter (HOSPITAL_COMMUNITY): Payer: Self-pay | Admitting: *Deleted

## 2016-06-14 ENCOUNTER — Inpatient Hospital Stay (HOSPITAL_COMMUNITY): Payer: Medicare Other

## 2016-06-14 ENCOUNTER — Inpatient Hospital Stay (HOSPITAL_COMMUNITY)
Admission: AD | Admit: 2016-06-14 | Discharge: 2016-06-17 | DRG: 682 | Disposition: A | Discharge status: Skilled Nursing Facility | Payer: Medicare Other | Source: Ambulatory Visit | Attending: Internal Medicine | Admitting: Internal Medicine

## 2016-06-14 DIAGNOSIS — D61818 Other pancytopenia: Secondary | ICD-10-CM | POA: Diagnosis present

## 2016-06-14 DIAGNOSIS — H9190 Unspecified hearing loss, unspecified ear: Secondary | ICD-10-CM | POA: Diagnosis present

## 2016-06-14 DIAGNOSIS — N390 Urinary tract infection, site not specified: Secondary | ICD-10-CM | POA: Diagnosis present

## 2016-06-14 DIAGNOSIS — E538 Deficiency of other specified B group vitamins: Secondary | ICD-10-CM | POA: Diagnosis present

## 2016-06-14 DIAGNOSIS — R627 Adult failure to thrive: Secondary | ICD-10-CM | POA: Diagnosis present

## 2016-06-14 DIAGNOSIS — N400 Enlarged prostate without lower urinary tract symptoms: Secondary | ICD-10-CM | POA: Diagnosis present

## 2016-06-14 DIAGNOSIS — H548 Legal blindness, as defined in USA: Secondary | ICD-10-CM | POA: Diagnosis present

## 2016-06-14 DIAGNOSIS — C8590 Non-Hodgkin lymphoma, unspecified, unspecified site: Secondary | ICD-10-CM | POA: Diagnosis present

## 2016-06-14 DIAGNOSIS — C8300 Small cell B-cell lymphoma, unspecified site: Secondary | ICD-10-CM | POA: Diagnosis present

## 2016-06-14 DIAGNOSIS — E860 Dehydration: Secondary | ICD-10-CM | POA: Diagnosis present

## 2016-06-14 DIAGNOSIS — N3000 Acute cystitis without hematuria: Secondary | ICD-10-CM | POA: Diagnosis not present

## 2016-06-14 DIAGNOSIS — Z66 Do not resuscitate: Secondary | ICD-10-CM | POA: Diagnosis present

## 2016-06-14 DIAGNOSIS — R05 Cough: Secondary | ICD-10-CM | POA: Diagnosis present

## 2016-06-14 DIAGNOSIS — G4730 Sleep apnea, unspecified: Secondary | ICD-10-CM | POA: Diagnosis present

## 2016-06-14 DIAGNOSIS — E871 Hypo-osmolality and hyponatremia: Secondary | ICD-10-CM | POA: Diagnosis present

## 2016-06-14 DIAGNOSIS — Q7649 Other congenital malformations of spine, not associated with scoliosis: Secondary | ICD-10-CM

## 2016-06-14 DIAGNOSIS — Z6820 Body mass index (BMI) 20.0-20.9, adult: Secondary | ICD-10-CM

## 2016-06-14 DIAGNOSIS — Z87891 Personal history of nicotine dependence: Secondary | ICD-10-CM | POA: Diagnosis not present

## 2016-06-14 DIAGNOSIS — Z87442 Personal history of urinary calculi: Secondary | ICD-10-CM

## 2016-06-14 DIAGNOSIS — Z79899 Other long term (current) drug therapy: Secondary | ICD-10-CM

## 2016-06-14 DIAGNOSIS — N179 Acute kidney failure, unspecified: Secondary | ICD-10-CM | POA: Diagnosis present

## 2016-06-14 DIAGNOSIS — Z821 Family history of blindness and visual loss: Secondary | ICD-10-CM

## 2016-06-14 DIAGNOSIS — N1330 Unspecified hydronephrosis: Secondary | ICD-10-CM | POA: Diagnosis present

## 2016-06-14 DIAGNOSIS — I959 Hypotension, unspecified: Secondary | ICD-10-CM | POA: Diagnosis present

## 2016-06-14 DIAGNOSIS — E43 Unspecified severe protein-calorie malnutrition: Secondary | ICD-10-CM | POA: Diagnosis present

## 2016-06-14 DIAGNOSIS — R531 Weakness: Secondary | ICD-10-CM | POA: Diagnosis present

## 2016-06-14 LAB — CBC
HCT: 30.8 % — ABNORMAL LOW (ref 39.0–52.0)
Hemoglobin: 9.9 g/dL — ABNORMAL LOW (ref 13.0–17.0)
MCH: 27.1 pg (ref 26.0–34.0)
MCHC: 32.1 g/dL (ref 30.0–36.0)
MCV: 84.4 fL (ref 78.0–100.0)
PLATELETS: 120 10*3/uL — AB (ref 150–400)
RBC: 3.65 MIL/uL — ABNORMAL LOW (ref 4.22–5.81)
RDW: 16 % — ABNORMAL HIGH (ref 11.5–15.5)
WBC: 3.1 10*3/uL — ABNORMAL LOW (ref 4.0–10.5)

## 2016-06-14 LAB — COMPREHENSIVE METABOLIC PANEL
ALBUMIN: 2.3 g/dL — AB (ref 3.5–5.0)
ALK PHOS: 101 U/L (ref 38–126)
ALT: 15 U/L — AB (ref 17–63)
AST: 17 U/L (ref 15–41)
Anion gap: 7 (ref 5–15)
BUN: 44 mg/dL — AB (ref 6–20)
CALCIUM: 8.2 mg/dL — AB (ref 8.9–10.3)
CO2: 23 mmol/L (ref 22–32)
CREATININE: 1.9 mg/dL — AB (ref 0.61–1.24)
Chloride: 102 mmol/L (ref 101–111)
GFR calc Af Amer: 33 mL/min — ABNORMAL LOW (ref >60)
GFR calc non Af Amer: 29 mL/min — ABNORMAL LOW (ref >60)
GLUCOSE: 97 mg/dL (ref 65–99)
Potassium: 4.2 mmol/L (ref 3.5–5.1)
SODIUM: 132 mmol/L — AB (ref 135–145)
Total Bilirubin: 0.5 mg/dL (ref 0.3–1.2)
Total Protein: 4.8 g/dL — ABNORMAL LOW (ref 6.5–8.1)

## 2016-06-14 LAB — SODIUM, URINE, RANDOM: SODIUM UR: 41 mmol/L

## 2016-06-14 MED ORDER — FAMOTIDINE 20 MG PO TABS
10.0000 mg | ORAL_TABLET | Freq: Every day | ORAL | Status: DC
  Administered 2016-06-15 – 2016-06-17 (×3): 10 mg via ORAL
  Filled 2016-06-14 (×3): qty 1

## 2016-06-14 MED ORDER — OMEGA-3 FATTY ACIDS 1000 MG PO CAPS: 1.0000 g | ORAL_CAPSULE | Freq: Every day | ORAL | Status: DC

## 2016-06-14 MED ORDER — OMEGA-3-ACID ETHYL ESTERS 1 G PO CAPS
1.0000 g | ORAL_CAPSULE | Freq: Every day | ORAL | Status: DC
  Administered 2016-06-15 – 2016-06-17 (×3): 1 g via ORAL
  Filled 2016-06-14 (×3): qty 1

## 2016-06-14 MED ORDER — ALBUTEROL SULFATE (2.5 MG/3ML) 0.083% IN NEBU: 2.5000 mg | INHALATION_SOLUTION | Freq: Four times a day (QID) | RESPIRATORY_TRACT | Status: DC | PRN

## 2016-06-14 MED ORDER — FINASTERIDE 5 MG PO TABS
5.0000 mg | ORAL_TABLET | Freq: Every day | ORAL | Status: DC
  Administered 2016-06-15 – 2016-06-17 (×3): 5 mg via ORAL
  Filled 2016-06-14 (×3): qty 1

## 2016-06-14 MED ORDER — SODIUM CHLORIDE 0.9 % IV SOLN
INTRAVENOUS | Status: AC
  Administered 2016-06-14: 19:00:00 via INTRAVENOUS

## 2016-06-14 MED ORDER — ACETAMINOPHEN 650 MG RE SUPP: 650.0000 mg | Freq: Four times a day (QID) | RECTAL | Status: DC | PRN

## 2016-06-14 MED ORDER — GUAIFENESIN ER 600 MG PO TB12
600.0000 mg | ORAL_TABLET | Freq: Two times a day (BID) | ORAL | Status: DC | PRN
  Administered 2016-06-17: 600 mg via ORAL
  Filled 2016-06-14: qty 1

## 2016-06-14 MED ORDER — IBRUTINIB 140 MG PO CAPS: 420.0000 mg | ORAL_CAPSULE | Freq: Every day | ORAL | Status: DC

## 2016-06-14 MED ORDER — CARBOXYMETHYLCELL-HYPROMELLOSE 0.25-0.3 % OP GEL: 1.0000 [drp] | Freq: Two times a day (BID) | OPHTHALMIC | Status: DC | PRN

## 2016-06-14 MED ORDER — HEPARIN SODIUM (PORCINE) 5000 UNIT/ML IJ SOLN
5000.0000 [IU] | Freq: Three times a day (TID) | INTRAMUSCULAR | Status: DC
  Administered 2016-06-14 – 2016-06-17 (×8): 5000 [IU] via SUBCUTANEOUS
  Filled 2016-06-14 (×8): qty 1

## 2016-06-14 MED ORDER — POLYVINYL ALCOHOL 1.4 % OP SOLN
1.0000 [drp] | Freq: Two times a day (BID) | OPHTHALMIC | Status: DC | PRN
  Administered 2016-06-16: 1 [drp] via OPHTHALMIC
  Filled 2016-06-14: qty 15

## 2016-06-14 MED ORDER — ACETAMINOPHEN 325 MG PO TABS: 650.0000 mg | ORAL_TABLET | Freq: Four times a day (QID) | ORAL | Status: DC | PRN

## 2016-06-14 NOTE — H&P (Signed)
Patient Demographics:    Daniel [name], is a 82 y.o. male  MRN: [id number]   DOB - [birth year]  Admit Date - 06/14/2016  Outpatient Primary MD for the patient is [name], MD  Outpatient Specialists: urology [facility] Sage  Patient coming from:  Home/ office  No chief complaint on file.   Cc: ARF   HPI:    Daniel [name]  is a 82 y.o. male, w lymphoma, chronic cough, legal blindness, has failure to thrive at home.  His po intake has been very minimal.  Pt was seen at office and noted to have increase in creatinine to 2.3  (prev 0.92 on 03/18/2016).  Pt found to have UTI as well.  Pt has chronic cough, generalized weakness, and will be admitted for this and ARF.     Review of systems:    In addition to the HPI above,  No Fever-chills, No Headache, No changes with Vision or hearing, No problems swallowing food or Liquids, No Chest pain,  Shortness of Breath, No Abdominal pain, No Nausea or Vommitting, Bowel movements are regular, No Blood in stool or Urine, No dysuria, No new skin rashes or bruises, No new joints pains-aches,  No new weakness, tingling, numbness in any extremity, No recent weight gain or loss, No polyuria, polydypsia or polyphagia, No significant Mental Stressors.  A full 10 point Review of Systems was done, except as stated above, all other Review of Systems were negative.   With Past History of the following :    Past Medical History  Diagnosis Date  . HOH (hard of hearing)   . Diverticulosis   . BPH (benign prostatic hyperplasia)   . Congenital malformation of spine     T12  . Hx of hydronephrosis   . History of kidney stones 2013  . Cough for last 2 years    occasional white sputum  . Sleep apnea   . Prostate enlargement   . Hydronephrosis 10/27/2014  . NHL (non-Hodgkin's lymphoma) ([place])     nhl dx 5/09;  . Non-Hodgkin lymphoma ([place])   . B12 deficiency 06/06/2016      Past Surgical History  Procedure Laterality Date  . Prostate  surgery  yrs ago  . Eye surgery  yrs ago    Implants bilateral  . Inguinal hernia repair  yrs ago    left  . Cholecystectomy  yrs ago  . Esophagogastroduodenoscopy (egd) with propofol N/A 07/15/2013    Procedure: ESOPHAGOGASTRODUODENOSCOPY (EGD) WITH PROPOFOL;  Surgeon: Milus Banister, MD;  Location: [facility];  Service: Endoscopy;  Laterality: N/A;  botox injection  . Botox injection N/A 07/15/2013    Procedure: BOTOX INJECTION;  Surgeon: Milus Banister, MD;  Location: [facility];  Service: Endoscopy;  Laterality: N/A;      Social History:     Social History  Substance Use Topics  . Smoking status: Former Smoker -- 0.25 packs/day for 3 years    Types: Pipe    Quit date: 12/10/1959  . Smokeless tobacco: Never Used     Comment: Pt states that he never inhaled. Pt also states that while in service he smoked cigs x 2 months.   . Alcohol Use: 0.6 oz/week    1 Glasses of wine per week     Comment: occasional     Lives - at home by self  Mobility -   Walks with walker   Family History :     Family History  Problem Relation Age of Onset  . Tuberculosis Mother   . Blindness Father       Home Medications:    Pt is taking  Levaquin 250mg  po qday Finasteride 5mg  poq day Fish oil 1000mg  po qday Imbruvica   Doubtful that he is taking any other medication.   Prior to Admission medications   Medication Sig Start Date End Date Taking? Authorizing Provider  albuterol (PROVENTIL) (2.5 MG/3ML) 0.083% nebulizer solution Take 3 mLs (2.5 mg total) by nebulization 3 (three) times daily. 03/19/16   [name], MD  albuterol (PROVENTIL) (2.5 MG/3ML) 0.083% nebulizer solution Take 3 mLs (2.5 mg total) by nebulization every 6 (six) hours as needed for wheezing or shortness of breath. 03/19/16   [name], MD  famotidine (PEPCID) 10 MG tablet Take 1 tablet (10 mg total) by mouth 2 (two) times daily. 03/19/16   [name], MD  feeding supplement, ENSURE ENLIVE, (ENSURE ENLIVE) LIQD Take  237 mLs by mouth 3 (three) times daily between meals. 03/19/16   [name], MD  finasteride (PROSCAR) 5 MG tablet Take 5 mg by mouth daily with breakfast.     Historical Provider, MD  fish oil-omega-3 fatty acids 1000 MG capsule Take 1 g by mouth daily with breakfast.     Historical Provider, MD  FUROSEMIDE PO Take by mouth daily.    Historical Provider, MD  glucosamine-chondroitin 500-400 MG tablet Take 1 tablet by mouth daily.     Historical Provider, MD  Hypromellose (GENTEAL OP) Place 1 drop into both eyes 2 (two) times daily as needed. Dry eyes    Historical Provider, MD  ibrutinib (IMBRUVICA) 140 MG capsul Take 3 capsules (420 mg total) by mouth daily. 11/14/14   Heath Lark, MD  Multiple Vitamins-Minerals (ICAPS MV PO) Take 1 tablet by mouth daily.    Historical Provider, MD     Allergies:    No Known Allergies   Physical Exam:   Vitals  There were no vitals taken for this visit.   1. General  lying in bed in NAD,    2. Normal affect and insight, Not Suicidal or Homicidal, Awake Alert, Oriented X 3.  3. No F.N deficits, ALL C.Nerves Intact, Strength 5/5 all 4 extremities, Sensation intact all 4 extremities, Plantars down going.  4. Ears and Eyes appear Normal, Conjunctivae clear, PERRLA. Vision poor , dry Oral Mucosa.  5. Supple Neck, No JVD, No cervical lymphadenopathy appriciated, No Carotid Bruits.  6. Symmetrical Chest wall movement, Good air movement bilaterally, CTAB.  7. RRR, No Gallops, Rubs or Murmurs, No Parasternal Heave.  8. Positive Bowel Sounds, Abdomen Soft, No tenderness, No organomegaly appriciated,No rebound -guarding or rigidity.  9.  No Cyanosis, Normal Skin Turgor, No Skin Rash or Bruise.  10. Good muscle tone,  joints appear normal , no effusions, Normal ROM.  11. No Palpable Lymph Nodes in Neck or Axillae     Data Review:    CBC No results for input(s): WBC, HGB, HCT, PLT, MCV, MCH, MCHC, RDW, LYMPHSABS, MONOABS, EOSABS, BASOSABS, BANDABS in  the last 168 hours.  Invalid input(s): NEUTRABS, BANDSABD ------------------------------------------------------------------------------------------------------------------  Chemistries  No results for input(s): NA, K, CL, CO2, GLUCOSE, BUN, CREATININE, CALCIUM, MG, AST, ALT, ALKPHOS, BILITOT in the last 168 hours.  Invalid input(s): GFRCGP ------------------------------------------------------------------------------------------------------------------ estimated creatinine clearance is 18.2 mL/min (by C-G formula based on Cr of 2). ------------------------------------------------------------------------------------------------------------------ No results for input(s): TSH, T4TOTAL, T3FREE, THYROIDAB in the last 72 hours.  Invalid input(s): FREET3  Coagulation  profile No results for input(s): INR, PROTIME in the last 168 hours. ------------------------------------------------------------------------------------------------------------------- No results for input(s): DDIMER in the last 72 hours. -------------------------------------------------------------------------------------------------------------------  Cardiac Enzymes No results for input(s): CKMB, TROPONINI, MYOGLOBIN in the last 168 hours.  Invalid input(s): CK ------------------------------------------------------------------------------------------------------------------ No results found for: BNP   ---------------------------------------------------------------------------------------------------------------  Urinalysis    Component Value Date/Time   COLORURINE BROWN* 04/07/2016 2257   APPEARANCEUR TURBID* 04/07/2016 2257   LABSPEC 1.016 04/07/2016 2257   PHURINE 6.5 04/07/2016 2257   GLUCOSEU NEGATIVE 04/07/2016 2257   HGBUR LARGE* 04/07/2016 2257   BILIRUBINUR NEGATIVE 04/07/2016 2257   KETONESUR NEGATIVE 04/07/2016 2257   PROTEINUR >300* 04/07/2016 2257   NITRITE POSITIVE* 04/07/2016 2257   LEUKOCYTESUR  LARGE* 04/07/2016 2257    ----------------------------------------------------------------------------------------------------------------   Imaging Results:    No results found.    Assessment & Plan:    Active Problems:   Lymphoma, small lymphocytic (HCC)   Protein-calorie malnutrition, severe   Acute renal failure ([place])    1. ARF Check urine sodium, urine creatinine, urine eosinophils, cmp Check renal ultrasound Hydrate with  Ns iv  2.  Protein calorie malnutrition prostat  3. Lymphoma Cont imbruvica  4. Hx of anemia Check cbc  5. Chronic cough guaifenasin pepcid  6. Blindness  7. Deconditioning, gait instabiliy PT  To evaluate and tx.   DVT Prophylaxis Heparin -  SCDs   AM Labs Ordered, also please review Full Orders  Family Communication: Admission, patients condition and plan of care including tests being ordered have been discussed with the patient who indicate understanding and agree with the plan and Code Status.  Code Status DNR  Likely DC to  SNF  Condition GUARDED    Consults called: none  Admission status: inpatient   Time spent in minutes : 81 minnutes   [name] M.D on 06/14/2016 at 4:00 PM  Between 7am to 7am - Pager - [phone].  [facility] - Office  [phone]

## 2016-06-14 NOTE — Progress Notes (Signed)
Ibrutinib PTA for Lymphoma, last dose 7/7 am  Ibrutinib (Imbruvica) hold criteria: met ANC < 750 Platelet count < 50,000 ( currently 99,000) SCr > 1.5x baseline (or > 2 if baseline unknown) Bili > 1.5x ULN AST or ALT > 3 ULN NYHA Class 3 or 4 Heart failure Active infection Hemorrhage 3-7 days pre- or post-surgery  Will hold Ibrutinib during treatment for PNA. Please consult with Onc MD when appropriate to resume  Thank you ,  Netta Cedars, PharmD, BCPS Pager: [phone] 06/14/2016'@6' :44 PM

## 2016-06-15 DIAGNOSIS — N390 Urinary tract infection, site not specified: Secondary | ICD-10-CM

## 2016-06-15 LAB — CBC
HEMATOCRIT: 27.5 % — AB (ref 39.0–52.0)
HEMOGLOBIN: 8.9 g/dL — AB (ref 13.0–17.0)
MCH: 27.4 pg (ref 26.0–34.0)
MCHC: 32.4 g/dL (ref 30.0–36.0)
MCV: 84.6 fL (ref 78.0–100.0)
Platelets: 105 10*3/uL — ABNORMAL LOW (ref 150–400)
RBC: 3.25 MIL/uL — AB (ref 4.22–5.81)
RDW: 16 % — ABNORMAL HIGH (ref 11.5–15.5)
WBC: 2.3 10*3/uL — AB (ref 4.0–10.5)

## 2016-06-15 LAB — COMPREHENSIVE METABOLIC PANEL
ALT: 12 U/L — ABNORMAL LOW (ref 17–63)
ANION GAP: 4 — AB (ref 5–15)
AST: 14 U/L — ABNORMAL LOW (ref 15–41)
Albumin: 1.9 g/dL — ABNORMAL LOW (ref 3.5–5.0)
Alkaline Phosphatase: 79 U/L (ref 38–126)
BILIRUBIN TOTAL: 0.3 mg/dL (ref 0.3–1.2)
BUN: 38 mg/dL — ABNORMAL HIGH (ref 6–20)
CO2: 24 mmol/L (ref 22–32)
Calcium: 7.8 mg/dL — ABNORMAL LOW (ref 8.9–10.3)
Chloride: 106 mmol/L (ref 101–111)
Creatinine, Ser: 1.7 mg/dL — ABNORMAL HIGH (ref 0.61–1.24)
GFR calc Af Amer: 38 mL/min — ABNORMAL LOW (ref >60)
GFR, EST NON AFRICAN AMERICAN: 33 mL/min — AB (ref >60)
Glucose, Bld: 99 mg/dL (ref 65–99)
POTASSIUM: 4 mmol/L (ref 3.5–5.1)
Sodium: 134 mmol/L — ABNORMAL LOW (ref 135–145)
TOTAL PROTEIN: 4.1 g/dL — AB (ref 6.5–8.1)

## 2016-06-15 LAB — BASIC METABOLIC PANEL
Anion gap: 5 (ref 5–15)
BUN: 41 mg/dL — AB (ref 6–20)
CHLORIDE: 104 mmol/L (ref 101–111)
CO2: 24 mmol/L (ref 22–32)
CREATININE: 1.79 mg/dL — AB (ref 0.61–1.24)
Calcium: 7.8 mg/dL — ABNORMAL LOW (ref 8.9–10.3)
GFR calc non Af Amer: 31 mL/min — ABNORMAL LOW (ref >60)
GFR, EST AFRICAN AMERICAN: 36 mL/min — AB (ref >60)
Glucose, Bld: 107 mg/dL — ABNORMAL HIGH (ref 65–99)
Potassium: 4 mmol/L (ref 3.5–5.1)
Sodium: 133 mmol/L — ABNORMAL LOW (ref 135–145)

## 2016-06-15 LAB — TROPONIN I: Troponin I: <0.03 ng/mL (ref <0.03)

## 2016-06-15 MED ORDER — LEVOFLOXACIN 500 MG PO TABS
250.0000 mg | ORAL_TABLET | Freq: Every day | ORAL | Status: DC
  Administered 2016-06-15 – 2016-06-17 (×3): 250 mg via ORAL
  Filled 2016-06-15 (×3): qty 1

## 2016-06-15 MED ORDER — PRO-STAT SUGAR FREE PO LIQD
30.0000 mL | Freq: Two times a day (BID) | ORAL | Status: DC
  Administered 2016-06-15 – 2016-06-16 (×3): 30 mL via ORAL
  Filled 2016-06-15 (×3): qty 30

## 2016-06-15 MED ORDER — SODIUM CHLORIDE 0.9 % IV SOLN
Freq: Once | INTRAVENOUS | Status: AC
  Administered 2016-06-15: 06:00:00 via INTRAVENOUS

## 2016-06-15 NOTE — Evaluation (Signed)
Physical Therapy Evaluation Patient Details Name: Daniel [name] MRN: [id number] DOB: 1919/09/02 Today's Date: 06/15/2016   History of Present Illness   82 yo male with hx of lymphoma, Pancytopenia, apparently has had FTT, and protein calorie malnutrition, presented to office with uti, and found to have ARF. Creatinine 2.3 Prior baseline (prev 0.92 on 03/18/2016).   Clinical Impression  The patient is very pleasant and very spry. The patient will benefit from post acute rehab. Pt admitted with above diagnosis. Pt currently with functional limitations due to the deficits listed below (see PT Problem List).  Pt will benefit from skilled PT to increase their independence and safety with mobility to allow discharge to the venue listed below.      Follow Up Recommendations SNF;Supervision/Assistance - 24 hour (patient wants to go to [facility])    Equipment Recommendations  None recommended by PT    Recommendations for Other Services       Precautions / Restrictions Precautions Precautions: Fall      Mobility  Bed Mobility Overal bed mobility: Needs Assistance Bed Mobility: Sit to Supine       Sit to supine: Min assist   General bed mobility comments: assist with legs  Transfers Overall transfer level: Needs assistance Equipment used: Rolling walker (2 wheeled) Transfers: Sit to/from Stand Sit to Stand: Min assist         General transfer comment: cues for hand placement  Ambulation/Gait Ambulation/Gait assistance: Min assist Ambulation Distance (Feet): 400 Feet Assistive device: Rolling walker (2 wheeled) Gait Pattern/deviations: Step-through pattern;Trunk flexed     General Gait Details: assist with turns and  cues for posture  Stairs            Wheelchair Mobility    Modified Rankin (Stroke Patients Only)       Balance Overall balance assessment: Needs assistance         Standing balance support: During functional activity;Bilateral upper  extremity supported Standing balance-Leahy Scale: Fair                               Pertinent Vitals/Pain Pain Assessment: No/denies pain    Home Living Family/patient expects to be discharged to:: Private residence Living Arrangements: Alone               Additional Comments: pt agreeable to going to a short term SNF for rehab    Prior Function                 Hand Dominance        Extremity/Trunk Assessment   Upper Extremity Assessment: Generalized weakness           Lower Extremity Assessment: Generalized weakness      Cervical / Trunk Assessment: Kyphotic  Communication      Cognition Arousal/Alertness: Awake/alert Behavior During Therapy: WFL for tasks assessed/performed Overall Cognitive Status: Within Functional Limits for tasks assessed                      General Comments      Exercises        Assessment/Plan    PT Assessment Patient needs continued PT services  PT Diagnosis Difficulty walking;Generalized weakness   PT Problem List Decreased strength;Decreased activity tolerance;Decreased mobility;Decreased balance  PT Treatment Interventions DME instruction;Gait training;Functional mobility training;Therapeutic activities;Therapeutic exercise;Patient/family education   PT Goals (Current goals can be found in the Care Plan section) Acute  Rehab PT Goals Patient Stated Goal: to go to [facility] PT Goal Formulation: With patient Time For Goal Achievement: 06/29/16 Potential to Achieve Goals: Good    Frequency Min 3X/week   Barriers to discharge Decreased caregiver support      Co-evaluation               End of Session Equipment Utilized During Treatment: Gait belt Activity Tolerance: Patient tolerated treatment well Patient left: in bed;with call bell/phone within reach;with bed alarm set;with family/visitor present Nurse Communication: Mobility status         Time: 1435-1456 PT Time  Calculation (min) (ACUTE ONLY): 21 min   Charges:   PT Evaluation $PT Eval Low Complexity: 1 Procedure     PT G CodesClaretha Cooper 06/15/2016, 3:36 PM Tresa Endo PT [phone]

## 2016-06-15 NOTE — Progress Notes (Addendum)
Patient ID: Daniel [name], male   DOB: 01/04/1919, 82 y.o.   MRN: [id number]                                                                PROGRESS NOTE                                                                                                                                                                                                             Patient Demographics:    Daniel [name], is a 82 y.o. male, DOB - [birth year], [id number]  Admit date - 06/14/2016   Admitting Physician [name], MD  Outpatient Primary MD for the patient is [name], MD  LOS - 1  Outpatient Specialists: Stephan Minister  No chief complaint on file.    Uti, ARF  Brief Narrative  82 yo male with hx of lymphoma,  Pancytopenia, apparently has had FTT, and protein calorie malnutrition, presented to office with uti, and found to have ARF.  Creatinine 2.3  Prior baseline (prev 0.92 on 03/18/2016).    Subjective:    Daniel [name] today has been doing better.  Feeling stronger with the ivf.  Pt denies headache, cp, palp, sob.  General weakness better.      Assessment  & Plan :    Principal Problem:   Acute renal failure (ARF) (HCC) Active Problems:   Lymphoma, small lymphocytic (HCC)   Protein-calorie malnutrition, severe   Acute renal failure (HCC)   UTI (urinary tract infection)   1.  ARF secondary to dehydration Pt has slightly worse R hydronephrosis on ultrasound Will d/w urology Cont ns iv  2. UTI Cont levaquin  3. Hyponatremia Hydrate gently with ns iv If not improving, will check serum osm, tsh, cortisol, urine sodium, urine osm Check cmp in am  4.  Pancytopenia Check cbc in am  5. Protein calorie malnutrition prostat  6. Hypotension Check cortisol, trop  12 lead ekg Hydrate with ns 423mL bolus  Code Status : FULL CODE  Family Communication  : w [name] yesterday  Disposition Plan  :   SNF  Barriers For Discharge :    Consults  :     Procedures  : renal  ultrasound 06/14/2016  DVT Prophylaxis  :  Heparin - SCDs   Lab  Results  Component Value Date   PLT 120* 06/14/2016    Antibiotics  :  levaquin  Anti-infectives    None        Objective:   Filed Vitals:   06/14/16 2142 06/15/16 0532 06/15/16 0534 06/15/16 0552  BP: 91/49 78/42 86/45  78/42  Pulse: 80 70 71 70  Temp: 98 F (36.7 C) 97.9 F (36.6 C)    TempSrc: Oral Oral    Resp: 16 16    Height:      Weight:      SpO2: 97% 97%      Wt Readings from Last 3 Encounters:  06/14/16 55.792 kg (123 lb)  06/06/16 57.017 kg (125 lb 11.2 oz)  05/03/16 61.236 kg (135 lb)     Intake/Output Summary (Last 24 hours) at 06/15/16 0633 Last data filed at 06/15/16 0552  Gross per 24 hour  Intake    595 ml  Output    800 ml  Net   -205 ml     Physical Exam  Awake Alert, Oriented X 3, No new F.N deficits, Normal affect [place].AT,PERRAL Supple Neck,No JVD, No cervical lymphadenopathy appriciated.  Symmetrical Chest wall movement, Good air movement bilaterally, CTAB RRR,No Gallops,Rubs or new Murmurs, No Parasternal Heave +ve B.Sounds, Abd Soft, No tenderness, No organomegaly appriciated, No rebound - guarding or rigidity. No Cyanosis, Clubbing or edema, No new Rash or bruise  Foley in place    Data Review:    CBC  Recent Labs Lab 06/14/16 1742  WBC 3.1*  HGB 9.9*  HCT 30.8*  PLT 120*  MCV 84.4  MCH 27.1  MCHC 32.1  RDW 16.0*    Chemistries   Recent Labs Lab 06/14/16 1742 06/15/16 0314  NA 132* 133*  K 4.2 4.0  CL 102 104  CO2 23 24  GLUCOSE 97 107*  BUN 44* 41*  CREATININE 1.90* 1.79*  CALCIUM 8.2* 7.8*  AST 17  --   ALT 15*  --   ALKPHOS 101  --   BILITOT 0.5  --    ------------------------------------------------------------------------------------------------------------------ No results for input(s): CHOL, HDL, LDLCALC, TRIG, CHOLHDL, LDLDIRECT in the last 72 hours.  No results found for:  HGBA1C ------------------------------------------------------------------------------------------------------------------ No results for input(s): TSH, T4TOTAL, T3FREE, THYROIDAB in the last 72 hours.  Invalid input(s): FREET3 ------------------------------------------------------------------------------------------------------------------ No results for input(s): VITAMINB12, FOLATE, FERRITIN, TIBC, IRON, RETICCTPCT in the last 72 hours.  Coagulation profile No results for input(s): INR, PROTIME in the last 168 hours.  No results for input(s): DDIMER in the last 72 hours.  Cardiac Enzymes No results for input(s): CKMB, TROPONINI, MYOGLOBIN in the last 168 hours.  Invalid input(s): CK ------------------------------------------------------------------------------------------------------------------ No results found for: BNP  Inpatient Medications  Scheduled Meds: . famotidine  10 mg Oral Daily  . finasteride  5 mg Oral Q breakfast  . heparin  5,000 Units Subcutaneous Q8H  . omega-3 acid ethyl esters  1 g Oral Q breakfast   Continuous Infusions: . sodium chloride 75 mL/hr at 06/14/16 1855   PRN Meds:.acetaminophen **OR** acetaminophen, albuterol, guaiFENesin, polyvinyl alcohol  Micro Results No results found for this or any previous visit (from the past 240 hour(s)).  Radiology Reports US Renal  06/14/2016  CLINICAL DATA:  Acute renal failure. Inpatient. History of CLL/SLL. EXAM: RENAL / URINARY TRACT ULTRASOUND COMPLETE COMPARISON:  03/19/2016 renal scintigraphy study. 03/05/2016 PET-CT. 12/17/2015 renal sonogram. FINDINGS: Right Kidney: Length: 14.1 cm. Echogenic right renal parenchyma. Severe chronic right hydroureteronephrosis with right renal pelvis diameter 37 mm,  increased from 30 mm on 12/27/2015 sonogram. Marked dilation of the visualized proximal right ureter, which appears more prominent compared to the 12/27/2015 sonogram (25 mm versus 11 mm proximal right ureteral  diameter). No right renal mass demonstrated. Left Kidney: Length: 10.9 cm. Echogenic left renal parenchyma. Mild fullness of the central left renal collecting system without overt left hydronephrosis, not appreciably changed from 03/05/2016 PET-CT. No left renal mass demonstrated. Bladder: Bladder is collapsed by indwelling Foley catheter and cannot be evaluated on this scan. Incompletely visualized prostatomegaly. IMPRESSION: 1. Severe chronic right hydroureteronephrosis, which appears mildly worsened compared to the 12/27/2015 renal sonogram. 2. Chronic mild fullness of the central left renal collecting system without overt left hydronephrosis, unchanged . 3. Echogenic kidneys, indicating nonspecific renal parenchymal disease of uncertain chronicity. 4. Bladder collapsed by indwelling Foley catheter and not evaluated on this scan. 5. Prostatomegaly. Electronically Signed   By: Ilona Sorrel M.D.   On: 06/14/2016 19:26    Time Spent in minutes  30   [name] M.D on 06/15/2016 at 6:33 AM  Between 7am to 7am - Pager - [phone]

## 2016-06-16 LAB — CBC
HEMATOCRIT: 27.1 % — AB (ref 39.0–52.0)
HEMOGLOBIN: 8.7 g/dL — AB (ref 13.0–17.0)
MCH: 26.9 pg (ref 26.0–34.0)
MCHC: 32.1 g/dL (ref 30.0–36.0)
MCV: 83.6 fL (ref 78.0–100.0)
Platelets: 121 10*3/uL — ABNORMAL LOW (ref 150–400)
RBC: 3.24 MIL/uL — AB (ref 4.22–5.81)
RDW: 16 % — ABNORMAL HIGH (ref 11.5–15.5)
WBC: 2.4 10*3/uL — AB (ref 4.0–10.5)

## 2016-06-16 LAB — BASIC METABOLIC PANEL
ANION GAP: 3 — AB (ref 5–15)
BUN: 41 mg/dL — AB (ref 6–20)
CHLORIDE: 111 mmol/L (ref 101–111)
CO2: 23 mmol/L (ref 22–32)
Calcium: 7.8 mg/dL — ABNORMAL LOW (ref 8.9–10.3)
Creatinine, Ser: 1.48 mg/dL — ABNORMAL HIGH (ref 0.61–1.24)
GFR calc Af Amer: 45 mL/min — ABNORMAL LOW (ref >60)
GFR, EST NON AFRICAN AMERICAN: 39 mL/min — AB (ref >60)
GLUCOSE: 104 mg/dL — AB (ref 65–99)
POTASSIUM: 4.3 mmol/L (ref 3.5–5.1)
Sodium: 137 mmol/L (ref 135–145)

## 2016-06-16 LAB — CALCIUM / CREATININE RATIO, URINE
CALCIUM/CREAT. RATIO: 40 mg/g{creat} (ref 0–260)
CREATININE, UR: 55.5 mg/dL
Calcium, Ur: 2.2 mg/dL

## 2016-06-16 MED ORDER — SODIUM CHLORIDE 0.9 % IV SOLN
INTRAVENOUS | Status: AC
  Administered 2016-06-16: 06:00:00 via INTRAVENOUS
  Administered 2016-06-16: 75 mL/h via INTRAVENOUS

## 2016-06-16 MED ORDER — ENSURE ENLIVE PO LIQD
237.0000 mL | Freq: Three times a day (TID) | ORAL | Status: DC
  Administered 2016-06-16 – 2016-06-17 (×2): 237 mL via ORAL

## 2016-06-16 MED ORDER — ENSURE ENLIVE PO LIQD: 237.0000 mL | Freq: Two times a day (BID) | ORAL | Status: DC

## 2016-06-16 NOTE — Clinical Social Work Note (Signed)
Clinical Social Work Assessment  Patient Details  Name: Daniel [name] MRN: [id number] Date of Birth: 09/26/1919  Date of referral:  06/16/16               Reason for consult:  Facility Placement, Discharge Planning                Permission sought to share information with:  Facility Sport and exercise psychologist, Family Supports Permission granted to share information::  Yes, Verbal Permission Granted  Name::     Surveyor, quantity::  SNFs  Relationship::     Contact Information:     Housing/Transportation Living arrangements for the past 2 months:  Apartment Source of Information:  Patient Patient Interpreter Needed:  None Criminal Activity/Legal Involvement Pertinent to Current Situation/Hospitalization:  No - Comment as needed Significant Relationships:  Other(Comment) (HPOA [employer]) Lives with:  Self Do you feel safe going back to the place where you live?  Yes Need for family participation in patient care:  Yes (Comment) (Would like Daniel [name] updated. Message left.)  Care giving concerns:  Patient does not report any care giving concerns at this time. He does agree with SNF recommendation and requests [facility].   Social Worker assessment / plan:  CSW met with patient at bedside to complete assessment. The patient is pleasant and welcoming of CSW. The patient shares that he is from home alone and lacks family support. He states that his main support is his HPOA Daniel [name]. The patient would like Daniel [name] involved in discharge planning process. CSW explained SNF search/placement process to the patient and answered the patient's questions. He has a preference for [facility]. CSW left message for Daniel [name] to update. Call back requested. CSW will followup with bed offers once available.  Employment status:  Retired Forensic scientist:  [employer] PT Recommendations:  [place] / Referral to community resources:  [place]  Patient/Family's Response to care:  The patient appears happy with the care he has received. He expresses appreciation of CSW visit and assistance with placement.  Patient/Family's Understanding of and Emotional Response to Diagnosis, Current Treatment, and Prognosis:  The patient appears to have good insight into reason for admission and his post discharge needs (rehab). He verbalizes understanding of discharge process. The patient appears to be coping well with hospitalization and shows no signs of distress.   Emotional Assessment Appearance:  Appears stated age Attitude/Demeanor/Rapport:  Other (Patient is very pleasant, and welcoming of CSW.) Affect (typically observed):  Accepting, Appropriate, Calm, Pleasant Orientation:  Oriented to Self, Oriented to Place, Oriented to  Time, Oriented to Situation Alcohol / Substance use:  Not Applicable Psych involvement (Current and /or in the community):  No (Comment)  Discharge Needs  Concerns to be addressed:  Discharge Planning Concerns Readmission within the last 30 days:  No Current discharge risk:  Physical Impairment, Lives alone, Chronically ill Barriers to Discharge:  Continued Medical Work up   Fredderick Phenix B, LCSW 06/16/2016, 11:42 AM

## 2016-06-16 NOTE — Clinical Social Work Placement (Signed)
   CLINICAL SOCIAL WORK PLACEMENT  NOTE  Date:  06/16/2016  Patient Details  Name: Daniel [name] MRN: [id number] Date of Birth: [birth year]  Clinical Social Work is seeking post-discharge placement for this patient at the [place] level of care (*CSW will initial, date and re-position this form in  chart as items are completed):  Yes   Patient/family provided with [place] Work Department's list of facilities offering this level of care within the geographic area requested by the patient (or if unable, by the patient's family).  Yes   Patient/family informed of their freedom to choose among providers that offer the needed level of care, that participate in Medicare, Medicaid or managed care program needed by the patient, have an available bed and are willing to accept the patient.  Yes   Patient/family informed of [place]'s ownership interest in [facility] and [facility], as well as of the fact that they are under no obligation to receive care at these facilities.  PASRR submitted to EDS on       PASRR number received on       Existing PASRR number confirmed on 06/16/16     FL2 transmitted to all facilities in geographic area requested by pt/family on 06/16/16     FL2 transmitted to all facilities within larger geographic area on       Patient informed that his/her managed care company has contracts with or will negotiate with certain facilities, including the following:            Patient/family informed of bed offers received.  Patient chooses bed at       Physician recommends and patient chooses bed at      Patient to be transferred to   on  .  Patient to be transferred to facility by       Patient family notified on   of transfer.  Name of family member notified:        PHYSICIAN Please prepare priority discharge summary, including medications, Please prepare prescriptions, Please sign FL2, Please sign DNR      Additional Comment:    _______________________________________________ Rigoberto Noel, LCSW 06/16/2016, 12:05 PM

## 2016-06-16 NOTE — NC FL2 (Signed)
[place] LEVEL OF CARE SCREENING TOOL     IDENTIFICATION  Patient Name: Daniel [name] Birthdate: 08-07-1919 Sex: male Admission Date (Current Location): 06/14/2016  [facility] and [place] Number:  Herbalist and Address:  [facility],  [place], [place]      Provider Number: [id number]  Attending Physician Name and Address:  [name], MD  Relative Name and Phone Number:       Current Level of Care: Hospital Recommended Level of Care: [place] Prior Approval Number:    Date Approved/Denied:   PASRR Number: [id number]  Discharge Plan: SNF    Current Diagnoses: Patient Active Problem List   Diagnosis Date Noted  . UTI (urinary tract infection) 06/15/2016  . Acute renal failure ([place]) 06/14/2016  . Acute renal failure (ARF) ([place]) 06/14/2016  . Bilateral leg edema 06/07/2016  . Pancytopenia, acquired ([place]) 06/06/2016  . B12 deficiency 06/06/2016  . Acute respiratory failure ([place]) 03/19/2016  . Protein-calorie malnutrition, severe 03/15/2016  . Hypoxia 03/14/2016  . Lesion of left lung 03/07/2016  . Protein-calorie malnutrition, moderate ([place]) 03/07/2016  . Thrombocytopenia ([place]) 06/01/2015  . Hernia, inguinal, right 06/01/2015  . Essential hypertension 06/01/2015  . Chronic kidney disease (CKD), stage III (moderate) 01/10/2015  . 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 Preventive measure 11/14/2014  . Hydronephrosis, right 10/27/2014  . Cellulitis of arm, right 02/11/2014  . Bronchiectasis without acute exacerbation ([place]) 06/09/2013  . Hydroureteronephrosis 05/12/2012  . 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 CHRONIC AIRWAY OBSTRUCTION NEC 09/11/2009    Orientation RESPIRATION BLADDER Height & Weight     Self, Time, Situation, Place  Normal Continent Weight: 55.792 kg (123 lb) Height:  5\' 5"  (165.1 cm)  BEHAVIORAL SYMPTOMS/MOOD NEUROLOGICAL BOWEL  NUTRITION STATUS   (NONE)  (NONE) Continent Diet (Regular)  AMBULATORY STATUS COMMUNICATION OF NEEDS Skin   Extensive Assist Verbally Normal                       Personal Care Assistance Level of Assistance  Bathing, Feeding, Dressing Bathing Assistance: Limited assistance Feeding assistance: Independent Dressing Assistance: Limited assistance     Functional Limitations Info  Sight, Hearing, Speech Sight Info: Adequate Hearing Info: Impaired Speech Info: Adequate    SPECIAL CARE FACTORS FREQUENCY  PT (By licensed PT), OT (By licensed OT)     PT Frequency: 5/week OT Frequency: 5/week            Contractures Contractures Info: Not present    Additional Factors Info  Code Status, Allergies Code Status Info: DNR Allergies Info: NKDA           Current Medications (06/16/2016):  This is the current hospital active medication list Current Facility-Administered Medications  Medication Dose Route Frequency Provider Last Rate Last Dose  . 0.9 %  sodium chloride infusion   Intravenous Continuous [name], MD 75 mL/hr at 06/16/16 0531    . acetaminophen (TYLENOL) tablet 650 mg  650 mg Oral Q6H PRN [name], MD       Or  . acetaminophen (TYLENOL) suppository 650 mg  650 mg Rectal Q6H PRN [name], MD      . albuterol (PROVENTIL) (2.5 MG/3ML) 0.083% nebulizer solution 2.5 mg  2.5 mg Nebulization Q6H PRN [name], MD      . famotidine (PEPCID) tablet 10 mg  10 mg Oral Daily [name], MD   10 mg  at 06/16/16 1006  . feeding supplement (PRO-STAT SUGAR FREE 64) liquid 30 mL  30 mL Oral BID [name], MD   30 mL at 06/16/16 1007  . finasteride (PROSCAR) tablet 5 mg  5 mg Oral Q breakfast [name], MD   5 mg at 06/16/16 1005  . guaiFENesin (MUCINEX) 12 hr tablet 600 mg  600 mg Oral BID PRN [name], MD      . heparin injection 5,000 Units  5,000 Units Subcutaneous Q8H [name], MD   5,000 Units at 06/16/16 0532  . levofloxacin (LEVAQUIN) tablet 250 mg  250 mg Oral Daily  [name], MD   250 mg at 06/16/16 1057  . omega-3 acid ethyl esters (LOVAZA) capsule 1 g  1 g Oral Q breakfast [name], MD   1 g at 06/16/16 1057  . polyvinyl alcohol (LIQUIFILM TEARS) 1.4 % ophthalmic solution 1 drop  1 drop Both Eyes BID PRN [name], MD   1 drop at 06/16/16 1058     Discharge Medications: Please see discharge summary for a list of discharge medications.  Relevant Imaging Results:  Relevant Lab Results:   Additional Information SSN: [id number]  Rigoberto Noel, LCSW

## 2016-06-16 NOTE — Progress Notes (Signed)
Patient ID: Daniel [name], male   DOB: 09-19-1919, 82 y.o.   MRN: [id number]                                                                 PROGRESS NOTE                                                                                                                                                                                                             Patient Demographics:    Daniel [name], is a 82 y.o. male, DOB - [birth year], [id number]  Admit date - 06/14/2016   Admitting Physician [name], MD  Outpatient Primary MD for the patient is [name], MD  LOS - 2  Outpatient Specialists: Stephan Minister  No chief complaint on file.     UTI, ARF  Brief Narrative  82 yo male with hx of lymphoma, Pancytopenia, apparently has had FTT, and protein calorie malnutrition, presented to office with uti, and found to have ARF. Creatinine 2.3 Prior baseline (prev 0.92 on 03/18/2016).    Subjective:    Daniel [name] today has, No headache, No chest pain, No abdominal pain - No Nausea, No new weakness tingling or numbness, No Cough - SOB.   Assessment  & Plan :    Principal Problem:   Acute renal failure (ARF) (HCC) Active Problems:   Lymphoma, small lymphocytic (HCC)   Protein-calorie malnutrition, severe   Acute renal failure (HCC)   UTI (urinary tract infection)   1. ARF Resolving Cont ns iv  2. Protein calorie malnutrition (severe) Cont prostat  3. Lymphoma Cont imbruvica  4. Hx of anemia/ pancytopenia Check cbc  5. Chronic cough Cont guaifenasin Cont pepcid  6. Blindness  7. Deconditioning, gait instabiliy Appreciate PT input  8.  Hypotension (asymptomatic) improved.   DVT Prophylaxis Heparin - SCDs   AM Labs Ordered, also please review Full Orders  Family Communication: Admission, patients condition and plan of care including tests being ordered have been discussed with the patient who indicate understanding and agree with the plan and Code  Status.  Code Status DNR  Likely DC to SNF  Condition GUARDED   Consults called: none  Admission status: inpatient   Time spent in minutes : 45 minnutes  Lab Results  Component Value Date  PLT 121* 06/16/2016    Antibiotics  :  levaquin  Anti-infectives    Start     Dose/Rate Route Frequency Ordered Stop   06/15/16 0800  levofloxacin (LEVAQUIN) tablet 250 mg     250 mg Oral Daily 06/15/16 0639          Objective:   Filed Vitals:   06/15/16 0552 06/15/16 0726 06/15/16 1400 06/15/16 2047  BP: 78/42 92/48 102/55 103/57  Pulse: 70  76 79  Temp:   97.7 F (36.5 C) 97.7 F (36.5 C)  TempSrc:   Oral Oral  Resp:   18 14  Height:      Weight:      SpO2:   98% 96%    Wt Readings from Last 3 Encounters:  06/14/16 55.792 kg (123 lb)  06/06/16 57.017 kg (125 lb 11.2 oz)  05/03/16 61.236 kg (135 lb)     Intake/Output Summary (Last 24 hours) at 06/16/16 0513 Last data filed at 06/15/16 2048  Gross per 24 hour  Intake   1120 ml  Output   1700 ml  Net   -580 ml     Physical Exam  Awake Alert, Oriented X 3, No new F.N deficits, Normal affect [place].AT,PERRAL Supple Neck,No JVD, No cervical lymphadenopathy appriciated.  Symmetrical Chest wall movement, Good air movement bilaterally, CTAB RRR,No Gallops,Rubs or new Murmurs, No Parasternal Heave +ve B.Sounds, Abd Soft, No tenderness, No organomegaly appriciated, No rebound - guarding or rigidity. No Cyanosis, Clubbing or edema, No new Rash or bruise      Data Review:    CBC  Recent Labs Lab 06/14/16 1742 06/15/16 0709 06/16/16 0335  WBC 3.1* 2.3* 2.4*  HGB 9.9* 8.9* 8.7*  HCT 30.8* 27.5* 27.1*  PLT 120* 105* 121*  MCV 84.4 84.6 83.6  MCH 27.1 27.4 26.9  MCHC 32.1 32.4 32.1  RDW 16.0* 16.0* 16.0*    Chemistries   Recent Labs Lab 06/14/16 1742 06/15/16 0314 06/15/16 0709  NA 132* 133* 134*  K 4.2 4.0 4.0  CL 102 104 106  CO2 23 24 24   GLUCOSE 97 107* 99  BUN 44* 41* 38*  CREATININE  1.90* 1.79* 1.70*  CALCIUM 8.2* 7.8* 7.8*  AST 17  --  14*  ALT 15*  --  12*  ALKPHOS 101  --  79  BILITOT 0.5  --  0.3   ------------------------------------------------------------------------------------------------------------------ No results for input(s): CHOL, HDL, LDLCALC, TRIG, CHOLHDL, LDLDIRECT in the last 72 hours.  No results found for: HGBA1C ------------------------------------------------------------------------------------------------------------------ No results for input(s): TSH, T4TOTAL, T3FREE, THYROIDAB in the last 72 hours.  Invalid input(s): FREET3 ------------------------------------------------------------------------------------------------------------------ No results for input(s): VITAMINB12, FOLATE, FERRITIN, TIBC, IRON, RETICCTPCT in the last 72 hours.  Coagulation profile No results for input(s): INR, PROTIME in the last 168 hours.  No results for input(s): DDIMER in the last 72 hours.  Cardiac Enzymes  Recent Labs Lab 06/15/16 0709  TROPONINI <0.03   ------------------------------------------------------------------------------------------------------------------ No results found for: BNP  Inpatient Medications  Scheduled Meds: . famotidine  10 mg Oral Daily  . feeding supplement (PRO-STAT SUGAR FREE 64)  30 mL Oral BID  . finasteride  5 mg Oral Q breakfast  . heparin  5,000 Units Subcutaneous Q8H  . levofloxacin  250 mg Oral Daily  . omega-3 acid ethyl esters  1 g Oral Q breakfast   Continuous Infusions: . sodium chloride     PRN Meds:.acetaminophen **OR** acetaminophen, albuterol, guaiFENesin, polyvinyl alcohol  Micro Results No results found for  this or any previous visit (from the past 240 hour(s)).  Radiology Reports US Renal  06/14/2016  CLINICAL DATA:  Acute renal failure. Inpatient. History of CLL/SLL. EXAM: RENAL / URINARY TRACT ULTRASOUND COMPLETE COMPARISON:  03/19/2016 renal scintigraphy study. 03/05/2016 PET-CT.  12/17/2015 renal sonogram. FINDINGS: Right Kidney: Length: 14.1 cm. Echogenic right renal parenchyma. Severe chronic right hydroureteronephrosis with right renal pelvis diameter 37 mm, increased from 30 mm on 12/27/2015 sonogram. Marked dilation of the visualized proximal right ureter, which appears more prominent compared to the 12/27/2015 sonogram (25 mm versus 11 mm proximal right ureteral diameter). No right renal mass demonstrated. Left Kidney: Length: 10.9 cm. Echogenic left renal parenchyma. Mild fullness of the central left renal collecting system without overt left hydronephrosis, not appreciably changed from 03/05/2016 PET-CT. No left renal mass demonstrated. Bladder: Bladder is collapsed by indwelling Foley catheter and cannot be evaluated on this scan. Incompletely visualized prostatomegaly. IMPRESSION: 1. Severe chronic right hydroureteronephrosis, which appears mildly worsened compared to the 12/27/2015 renal sonogram. 2. Chronic mild fullness of the central left renal collecting system without overt left hydronephrosis, unchanged . 3. Echogenic kidneys, indicating nonspecific renal parenchymal disease of uncertain chronicity. 4. Bladder collapsed by indwelling Foley catheter and not evaluated on this scan. 5. Prostatomegaly. Electronically Signed   By: Ilona Sorrel M.D.   On: 06/14/2016 19:26    Time Spent in minutes  30   [name] M.D on 06/16/2016 at 5:13 AM  Between 7am to 7am - Pager - [phone]

## 2016-06-16 NOTE — Progress Notes (Signed)
Initial Nutrition Assessment  DOCUMENTATION CODES:   Severe malnutrition in context of chronic illness  INTERVENTION:   -Provide Ensure Enlive po TID, each supplement provides 350 kcal and 20 grams of protein -Ordered snack per pt request -Encourage PO intake -RD to continue to monitor  NUTRITION DIAGNOSIS:   Malnutrition related to chronic illness as evidenced by percent weight loss, severe depletion of body fat, severe depletion of muscle mass.  GOAL:   Patient will meet greater than or equal to 90% of their needs  MONITOR:   PO intake, Supplement acceptance, Labs, Weight trends, I & O's  REASON FOR ASSESSMENT:   Malnutrition Screening Tool    ASSESSMENT:   82 yo male with hx of lymphoma, Pancytopenia, apparently has had FTT, and protein calorie malnutrition, presented to office with uti, and found to have ARF.  Pt reports improved appetite today. States he had 2 breakfast trays this morning (even though one was a tray he did not order). States he had pancakes, fruit and coffee. Pt documented as eating 50-60% of meals. Pt requested a snack of grapes, RD ordered. Pt has been ordered Prostat supplements, states he has not had them yet. He would prefer Ensure supplements, will order TID.  Per weight history, pt has lost 12 lb since 5/26 (9% wt loss x 1.5 months, significant for time frame). Pt with severe fat and severe muscle depletion, with moderate edema in LE.   Labs reviewed. Medications: Lovaza (Omega 3) capsule daily   Diet Order:  Diet regular Room service appropriate?: Yes; Fluid consistency:: Thin  Skin:  Reviewed, no issues  Last BM:  7/6  Height:   Ht Readings from Last 1 Encounters:  06/14/16 5\' 5"  (1.651 m)    Weight:   Wt Readings from Last 1 Encounters:  06/14/16 123 lb (55.792 kg)    Ideal Body Weight:  61.8 kg  BMI:  Body mass index is 20.47 kg/(m^2).  Estimated Nutritional Needs:   Kcal:  1600-1800  Protein:  70-80g  Fluid:   1.8L/day  EDUCATION NEEDS:   No education needs identified at this time  Clayton Bibles, MS, RD, LDN Pager: [phone] After Hours Pager: [phone]

## 2016-06-17 DIAGNOSIS — N179 Acute kidney failure, unspecified: Principal | ICD-10-CM

## 2016-06-17 DIAGNOSIS — C8300 Small cell B-cell lymphoma, unspecified site: Secondary | ICD-10-CM

## 2016-06-17 DIAGNOSIS — N3000 Acute cystitis without hematuria: Secondary | ICD-10-CM

## 2016-06-17 LAB — CBC
HEMATOCRIT: 28.8 % — AB (ref 39.0–52.0)
Hemoglobin: 9.1 g/dL — ABNORMAL LOW (ref 13.0–17.0)
MCH: 27.3 pg (ref 26.0–34.0)
MCHC: 31.6 g/dL (ref 30.0–36.0)
MCV: 86.5 fL (ref 78.0–100.0)
Platelets: 119 10*3/uL — ABNORMAL LOW (ref 150–400)
RBC: 3.33 MIL/uL — ABNORMAL LOW (ref 4.22–5.81)
RDW: 16.4 % — AB (ref 11.5–15.5)
WBC: 2.9 10*3/uL — ABNORMAL LOW (ref 4.0–10.5)

## 2016-06-17 LAB — COMPREHENSIVE METABOLIC PANEL
ALBUMIN: 1.9 g/dL — AB (ref 3.5–5.0)
ALK PHOS: 130 U/L — AB (ref 38–126)
ALT: 22 U/L (ref 17–63)
ANION GAP: 4 — AB (ref 5–15)
AST: 31 U/L (ref 15–41)
BILIRUBIN TOTAL: 0.4 mg/dL (ref 0.3–1.2)
BUN: 36 mg/dL — ABNORMAL HIGH (ref 6–20)
CALCIUM: 7.7 mg/dL — AB (ref 8.9–10.3)
CO2: 23 mmol/L (ref 22–32)
Chloride: 111 mmol/L (ref 101–111)
Creatinine, Ser: 1.17 mg/dL (ref 0.61–1.24)
GFR calc non Af Amer: 51 mL/min — ABNORMAL LOW (ref >60)
GFR, EST AFRICAN AMERICAN: 60 mL/min — AB (ref >60)
GLUCOSE: 109 mg/dL — AB (ref 65–99)
POTASSIUM: 4.1 mmol/L (ref 3.5–5.1)
Sodium: 138 mmol/L (ref 135–145)
TOTAL PROTEIN: 3.9 g/dL — AB (ref 6.5–8.1)

## 2016-06-17 MED ORDER — FAMOTIDINE 10 MG PO TABS: 10.0000 mg | ORAL_TABLET | Freq: Every day | ORAL | Status: AC

## 2016-06-17 MED ORDER — GUAIFENESIN ER 600 MG PO TB12: 600.0000 mg | ORAL_TABLET | Freq: Two times a day (BID) | ORAL | Status: DC | PRN

## 2016-06-17 MED ORDER — LEVOFLOXACIN 250 MG PO TABS: 250.0000 mg | ORAL_TABLET | Freq: Every day | ORAL | Status: DC

## 2016-06-17 NOTE — Care Management Important Message (Signed)
Important Message  Patient Details  Name: LENWOOD LEHMER MRN: [id number] Date of Birth: 1919/05/03   Medicare Important Message Given:  Yes    Camillo Flaming 06/17/2016, 10:44 AMImportant Message  Patient Details  Name: KEONTRE CLOUTIER MRN: [id number] Date of Birth: [birth year]   Medicare Important Message Given:  Yes    Camillo Flaming 06/17/2016, 10:44 AM

## 2016-06-17 NOTE — Discharge Summary (Signed)
Daniel [name], is a 82 y.o. male  DOB Jan 09, 1919  MRN [id number].  Admission date:  06/14/2016  Admitting Physician  [name], MD  Discharge Date:  06/17/2016   Primary MD  [name], MD  Recommendations for primary care physician for things to follow:   ARF Check cmp in 1 week  Pancytopenia Check cbc in 1 week  Lymphoma Restart on ibrutinib  Uti Cont levaquin 250mg  po qday x 3 days  Severe Protein Calorie Malnutrition Cont supplement  Deconditioning Appreciate PT    Admission Diagnosis  acute renal falure   Discharge Diagnosis  acute renal falure    Principal Problem:   Acute renal failure (ARF) (HCC) Active Problems:   Lymphoma, small lymphocytic (HCC)   Protein-calorie malnutrition, severe   Acute renal failure ([place])   UTI (urinary tract infection)      Past Medical History  Diagnosis Date  . HOH (hard of hearing)   . Diverticulosis   . BPH (benign prostatic hyperplasia)   . Congenital malformation of spine     T12  . Hx of hydronephrosis   . History of kidney stones 2013  . Cough for last 2 years    occasional white sputum  . Sleep apnea   . Prostate enlargement   . Hydronephrosis 10/27/2014  . NHL (non-Hodgkin's lymphoma) ([place])     nhl dx 5/09;  . Non-Hodgkin lymphoma ([place])   . B12 deficiency 06/06/2016    Past Surgical History  Procedure Laterality Date  . Prostate surgery  yrs ago  . Eye surgery  yrs ago    Implants bilateral  . Inguinal hernia repair  yrs ago    left  . Cholecystectomy  yrs ago  . Esophagogastroduodenoscopy (egd) with propofol N/A 07/15/2013    Procedure: ESOPHAGOGASTRODUODENOSCOPY (EGD) WITH PROPOFOL;  Surgeon: Milus Banister, MD;  Location: [facility];  Service: Endoscopy;  Laterality: N/A;  botox injection  . Botox injection N/A 07/15/2013    Procedure: BOTOX INJECTION;  Surgeon: Milus Banister, MD;  Location: [facility];   Service: Endoscopy;  Laterality: N/A;       HPI  from the history and physical done on the day of admission:     82 y.o. male, w lymphoma, chronic cough, legal blindness, has failure to thrive at home. His po intake has been very minimal. Pt was seen at office and noted to have increase in creatinine to 2.3 (prev 0.92 on 03/18/2016). Pt found to have UTI as well. Pt has chronic cough, generalized weakness, and will be admitted for this and ARF.     Hospital Course:      Pt admitted for ARF, tx with ns iv.  Renal ultrasound on 06/15/2016 showed chronic R hydronephrosis for which he is following with urology.  Pt was slightly hypotensive on day2.  Asymptomatic, probably secondary to dehydration vs position that the bp was taken. Trop negative. bp improved with hydration. Pancytopenia stable.  ibrutinib has been held due to ARF.  Pt continues on levaquin,  Almost finished. w his course.  Pt has chronic indwelling foley.  Pt is awaiting placement for deconditioning/ FTT, and monitoring of renal function so does not get dehydrated.    Follow UP w SNF physician 3 days after discharge.      Consults obtained - none  Discharge Condition: stable  Diet and Activity recommendation: See Discharge Instructions below  Discharge Instructions    Check cbc, cmp in 1 week.  Monitor bp.  PT /OT to evaluate and tx.       Discharge Medications       Medication List    STOP taking these medications        glucosamine-chondroitin 500-400 MG tablet      TAKE these medications        albuterol (2.5 MG/3ML) 0.083% nebulizer solution  Commonly known as:  PROVENTIL  Take 3 mLs (2.5 mg total) by nebulization every 6 (six) hours as needed for wheezing or shortness of breath.     famotidine 10 MG tablet  Commonly known as:  PEPCID  Take 1 tablet (10 mg total) by mouth daily.     feeding supplement (ENSURE ENLIVE) Liqd  Take 237 mLs by mouth 3 (three) times daily between meals.      finasteride 5 MG tablet  Commonly known as:  PROSCAR  Take 5 mg by mouth daily with breakfast.     fish oil-omega-3 fatty acids 1000 MG capsule  Take 1 g by mouth daily with breakfast.     GENTEAL OP  Place 1 drop into both eyes 2 (two) times daily as needed. Dry eyes     guaiFENesin 600 MG 12 hr tablet  Commonly known as:  MUCINEX  Take 1 tablet (600 mg total) by mouth 2 (two) times daily as needed for cough.     ibrutinib 140 MG capsul  Commonly known as:  IMBRUVICA  Take 3 capsules (420 mg total) by mouth daily.     ICAPS AREDS 2 Caps  Take 1 capsule by mouth 2 (two) times daily.     levofloxacin 250 MG tablet  Commonly known as:  LEVAQUIN  Take 1 tablet (250 mg total) by mouth daily.        Major procedures and Radiology Reports - PLEASE review detailed and final reports for all details, in brief -   Renal ultrasound on 06/15/2016 chronic R hydronephrosis   US Renal  06/14/2016  CLINICAL DATA:  Acute renal failure. Inpatient. History of CLL/SLL. EXAM: RENAL / URINARY TRACT ULTRASOUND COMPLETE COMPARISON:  03/19/2016 renal scintigraphy study. 03/05/2016 PET-CT. 12/17/2015 renal sonogram. FINDINGS: Right Kidney: Length: 14.1 cm. Echogenic right renal parenchyma. Severe chronic right hydroureteronephrosis with right renal pelvis diameter 37 mm, increased from 30 mm on 12/27/2015 sonogram. Marked dilation of the visualized proximal right ureter, which appears more prominent compared to the 12/27/2015 sonogram (25 mm versus 11 mm proximal right ureteral diameter). No right renal mass demonstrated. Left Kidney: Length: 10.9 cm. Echogenic left renal parenchyma. Mild fullness of the central left renal collecting system without overt left hydronephrosis, not appreciably changed from 03/05/2016 PET-CT. No left renal mass demonstrated. Bladder: Bladder is collapsed by indwelling Foley catheter and cannot be evaluated on this scan. Incompletely visualized prostatomegaly. IMPRESSION: 1. Severe  chronic right hydroureteronephrosis, which appears mildly worsened compared to the 12/27/2015 renal sonogram. 2. Chronic mild fullness of the central left renal collecting system without overt left hydronephrosis, unchanged . 3. Echogenic kidneys, indicating nonspecific renal  parenchymal disease of uncertain chronicity. 4. Bladder collapsed by indwelling Foley catheter and not evaluated on this scan. 5. Prostatomegaly. Electronically Signed   By: Ilona Sorrel M.D.   On: 06/14/2016 19:26    Micro Results     No results found for this or any previous visit (from the past 240 hour(s)).     Today   Subjective    Toryn Bushell today has no headache,no chest abdominal pain,no new weakness tingling or numbness, feels much better wants to go SNF  today. Awaiting placement   Objective   Blood pressure 93/42, pulse 77, temperature 98.5 F (36.9 C), temperature source Axillary, resp. rate 14, height 5\' 5"  (1.651 m), weight 55.792 kg (123 lb), SpO2 95 %.   Intake/Output Summary (Last 24 hours) at 06/17/16 0653 Last data filed at 06/17/16 0600  Gross per 24 hour  Intake 2553.25 ml  Output   1300 ml  Net 1253.25 ml    Exam Awake Alert, Oriented x 3, No new F.N deficits, Normal affect [place].AT,PERRAL Supple Neck,No JVD, No cervical lymphadenopathy appriciated.  Symmetrical Chest wall movement, Good air movement bilaterally, CTAB RRR,No Gallops,Rubs or new Murmurs, No Parasternal Heave +ve B.Sounds, Abd Soft, Non tender, No organomegaly appriciated, No rebound -guarding or rigidity. No Cyanosis, Clubbing or edema, No new rash, slight bruise on the left upper arm, ? bp cuff.  Chronic indwelling foley,  Hard of hearing    Data Review   CBC w Diff: Lab Results  Component Value Date   WBC 2.9* 06/17/2016   WBC 6.0 06/06/2016   HGB 9.1* 06/17/2016   HGB 12.8* 06/06/2016   HCT 28.8* 06/17/2016   HCT 40.5 06/06/2016   PLT 119* 06/17/2016   PLT 146 06/06/2016   LYMPHOPCT 29.8 06/06/2016     LYMPHOPCT 67 03/18/2016   BANDSPCT 0 11/09/2014   MONOPCT 13.7 06/06/2016   MONOPCT 7 03/18/2016   EOSPCT 0.0 06/06/2016   EOSPCT 0 03/18/2016   BASOPCT 0.3 06/06/2016   BASOPCT 0 03/18/2016    CMP: Lab Results  Component Value Date   NA 138 06/17/2016   NA 136 06/06/2016   NA 142 05/12/2012   K 4.1 06/17/2016   K 4.0 06/06/2016   K 4.5 05/12/2012   CL 111 06/17/2016   CL 103 04/19/2013   CL 100 05/12/2012   CO2 23 06/17/2016   CO2 27 06/06/2016   CO2 30 05/12/2012   BUN 36* 06/17/2016   BUN 42.1* 06/06/2016   BUN 24* 05/12/2012   CREATININE 1.17 06/17/2016   CREATININE 2.0* 06/06/2016   CREATININE 1.6* 05/12/2012   PROT 3.9* 06/17/2016   PROT 6.0* 06/06/2016   PROT 6.3* 05/12/2012   ALBUMIN 1.9* 06/17/2016   ALBUMIN 2.8* 06/06/2016   ALBUMIN 3.0* 05/12/2012   BILITOT 0.4 06/17/2016   BILITOT 1.04 06/06/2016   BILITOT 0.80 05/12/2012   ALKPHOS 130* 06/17/2016   ALKPHOS 143 06/06/2016   ALKPHOS 109* 05/12/2012   AST 31 06/17/2016   AST 17 06/06/2016   AST 22 05/12/2012   ALT 22 06/17/2016   ALT 12 06/06/2016   ALT 27 05/12/2012  .   Total Time in preparing paper work, data evaluation and todays exam - 20 minutes  [name] M.D on 06/17/2016 at 6:53 AM  Pager: [phone],

## 2016-06-17 NOTE — Progress Notes (Signed)
Imbruvica, walker and flowers given to [facility] (emergency contact) at patients request.

## 2016-06-17 NOTE — Progress Notes (Signed)
Report called to nurse at [facility].   

## 2016-06-17 NOTE — Progress Notes (Signed)
Patient is set to discharge to [facility] today. Patient & friend, Daniel [name], aware. Discharge packet given to RN, Nira Conn. [facility] called for transport.   Kingsley Spittle, [place] Clinical Social Worker [phone]

## 2016-06-17 NOTE — Clinical Social Work Placement (Signed)
   CLINICAL SOCIAL WORK PLACEMENT  NOTE  Date:  06/17/2016  Patient Details  Name: Daniel [name] MRN: [id number] Date of Birth: 1919-11-27  Clinical Social Work is seeking post-discharge placement for this patient at the [place] level of care (*CSW will initial, date and re-position this form in  chart as items are completed):  Yes   Patient/family provided with [place] Work Department's list of facilities offering this level of care within the geographic area requested by the patient (or if unable, by the patient's family).  Yes   Patient/family informed of their freedom to choose among providers that offer the needed level of care, that participate in Medicare, Medicaid or managed care program needed by the patient, have an available bed and are willing to accept the patient.  Yes   Patient/family informed of [facility]'s ownership interest in [facility] and [facility], as well as of the fact that they are under no obligation to receive care at these facilities.  PASRR submitted to EDS on       PASRR number received on       Existing PASRR number confirmed on 06/16/16     FL2 transmitted to all facilities in geographic area requested by pt/family on 06/16/16     FL2 transmitted to all facilities within larger geographic area on       Patient informed that his/her managed care company has contracts with or will negotiate with certain facilities, including the following:        Yes   Patient/family informed of bed offers received.  Patient chooses bed at [facility]     Physician recommends and patient chooses bed at      Patient to be transferred to [facility] on 06/17/16.  Patient to be transferred to facility by [facility]     Patient family notified on 06/17/16 of transfer.  Name of family member notified:  Abbegale Stehle-Friend     PHYSICIAN Please prepare priority discharge summary, including medications, Please prepare  prescriptions, Please sign FL2, Please sign DNR     Additional Comment:    _______________________________________________ Weston Anna, LCSW 06/17/2016, 12:21 PM

## 2016-06-18 ENCOUNTER — Non-Acute Institutional Stay (SKILLED_NURSING_FACILITY): Payer: Medicare Other | Admitting: Internal Medicine

## 2016-06-18 ENCOUNTER — Encounter: Payer: Self-pay | Admitting: Internal Medicine

## 2016-06-18 DIAGNOSIS — N183 Chronic kidney disease, stage 3 unspecified: Secondary | ICD-10-CM

## 2016-06-18 DIAGNOSIS — K219 Gastro-esophageal reflux disease without esophagitis: Secondary | ICD-10-CM

## 2016-06-18 DIAGNOSIS — N390 Urinary tract infection, site not specified: Secondary | ICD-10-CM | POA: Diagnosis not present

## 2016-06-18 DIAGNOSIS — E46 Unspecified protein-calorie malnutrition: Secondary | ICD-10-CM

## 2016-06-18 DIAGNOSIS — R5381 Other malaise: Secondary | ICD-10-CM | POA: Diagnosis not present

## 2016-06-18 DIAGNOSIS — N1330 Unspecified hydronephrosis: Secondary | ICD-10-CM | POA: Diagnosis not present

## 2016-06-18 DIAGNOSIS — C8300 Small cell B-cell lymphoma, unspecified site: Secondary | ICD-10-CM | POA: Diagnosis not present

## 2016-06-18 DIAGNOSIS — I959 Hypotension, unspecified: Secondary | ICD-10-CM

## 2016-06-18 NOTE — Progress Notes (Signed)
LOCATION: [facility]  PCP: [name], MD   Code Status: DNR  Goals of care: Advanced Directive information Advanced Directives 06/18/2016  Does patient have an advance directive? Yes  Type of Advance Directive Out of facility DNR (pink MOST or yellow form)  Does patient want to make changes to advanced directive? No - Patient declined  Copy of advanced directive(s) in chart? Yes       Extended Emergency Contact Information Primary Emergency Contact: Sturges,[name] Address: [address] [place], [place] [address] [place] Phone: [phone] Mobile Phone: [phone] Relation: Friend Secondary Emergency Contact: Page,Patty          Deirdre [place]ipp[place] Phone: [phone] Relation: Friend   No Known Allergies  Chief Complaint  Patient presents with  . New Admit To SNF    New Admission     HPI:  Patient is a 82 y.o. male seen today for short term rehabilitation post hospital admission from 06/14/16-06/17/16 with deconditioning from UTI and acute renal failure. He received antibiotics and iv fluids. He is seen in his room today. He has PMH of lymphoma and chronic right hydronephrosis with chronic foley catheter among others.   Review of Systems:  Constitutional: Negative for fever, chills, diaphoresis. Energy level is slowly coming back.  HENT: Negative for headache, congestion, nasal discharge. Eyes: Negative for blurred vision, double vision and discharge. Wears glasses. Respiratory: Negative for cough, shortness of breath and wheezing.   Cardiovascular: Negative for chest pain, palpitations, leg swelling.  Gastrointestinal: Negative for heartburn, nausea, vomiting, abdominal pain. Last bowel movement was yesterday.   Genitourinary: Negative for flank pain.  Musculoskeletal: Negative for back pain, fall in the facility.  Skin: Negative for itching, rash.  Neurological: Negative for  dizziness. Psychiatric/Behavioral: Negative for depression  Past Medical History  Diagnosis Date  . HOH (hard of hearing)   . Diverticulosis   . BPH (benign prostatic hyperplasia)   . Congenital malformation of spine     T12  . Hx of hydronephrosis   . History of kidney stones 2013  . Cough for last 2 years    occasional white sputum  . Sleep apnea   . Prostate enlargement   . Hydronephrosis 10/27/2014  . NHL (non-Hodgkin's lymphoma) ([place])     nhl dx 5/09;  . Non-Hodgkin lymphoma ([place])   . B12 deficiency 06/06/2016   Past Surgical History  Procedure Laterality Date  . Prostate surgery  yrs ago  . Eye surgery  yrs ago    Implants bilateral  . Inguinal hernia repair  yrs ago    left  . Cholecystectomy  yrs ago  . Esophagogastroduodenoscopy (egd) with propofol N/A 07/15/2013    Procedure: ESOPHAGOGASTRODUODENOSCOPY (EGD) WITH PROPOFOL;  Surgeon: Milus Banister, MD;  Location: [facility];  Service: Endoscopy;  Laterality: N/A;  botox injection  . Botox injection N/A 07/15/2013    Procedure: BOTOX INJECTION;  Surgeon: Milus Banister, MD;  Location: [facility];  Service: Endoscopy;  Laterality: N/A;   Social History:   reports that he quit smoking about 56 years ago. His smoking use included Pipe. He has never used smokeless tobacco. He reports that he drinks about 0.6 oz of alcohol per week. He reports that he does not use illicit drugs.  Family History  Problem Relation Age of Onset  . Tuberculosis Mother   . Blindness Father  Medications:   Medication List       This list is accurate as of: 06/18/16  1:53 PM.  Always use your most recent med list.               albuterol (2.5 MG/3ML) 0.083% nebulizer solution  Commonly known as:  PROVENTIL  Take 3 mLs (2.5 mg total) by nebulization every 6 (six) hours as needed for wheezing or shortness of breath.     famotidine 10 MG tablet  Commonly known as:  PEPCID  Take 1 tablet (10 mg total) by mouth daily.      finasteride 5 MG tablet  Commonly known as:  PROSCAR  Take 5 mg by mouth daily with breakfast.     fish oil-omega-3 fatty acids 1000 MG capsule  Take 1 g by mouth daily with breakfast.     GENTEAL OP  Place 1 drop into both eyes 2 (two) times daily. Dry eyes     guaiFENesin 600 MG 12 hr tablet  Commonly known as:  MUCINEX  Take 1 tablet (600 mg total) by mouth 2 (two) times daily as needed for cough.     ibrutinib 140 MG capsul  Commonly known as:  IMBRUVICA  Take 3 capsules (420 mg total) by mouth daily.     ICAPS AREDS 2 Caps  Take 1 capsule by mouth 2 (two) times daily.     levofloxacin 250 MG tablet  Commonly known as:  LEVAQUIN  Take 1 tablet (250 mg total) by mouth daily.     UNABLE TO FIND  Med Name: Med pass 120 mL TID between meals        Immunizations: Immunization History  Administered Date(s) Administered  . Influenza Split 09/08/2012  . Influenza,inj,Quad PF,36+ Mos 11/14/2014, 08/31/2015  . Influenza-Unspecified 09/19/2011, 09/22/2013     Physical Exam: Filed Vitals:   06/18/16 1347  BP: 100/47  Pulse: 83  Temp: 98.7 F (37.1 C)  TempSrc: Oral  Resp: 18  Height: 5\' 5"  (1.651 m)  Weight: 123 lb (55.792 kg)  SpO2: 94%   Body mass index is 20.47 kg/(m^2).  General- elderly male, thin built, in no acute distress Head- normocephalic, atraumatic, wears hearing aids Nose- no maxillary or frontal sinus tenderness, no nasal discharge Throat- moist mucus membrane  Eyes- PERRLA, EOMI, no pallor, no icterus, no discharge, normal conjunctiva, normal sclera Neck- no cervical lymphadenopathy Cardiovascular- normal s1,s2, no murmur, trace leg edema Respiratory- bilateral clear to auscultation, no wheeze, no rhonchi, no crackles, no use of accessory muscles Abdomen- bowel sounds present, soft, non tender, foley catheter with clear urine in the foley bag, umbilical hernia present Musculoskeletal- able to move all 4 extremities, generalized  weakness Neurological- alert and oriented to person, place and time Skin- warm and dry Psychiatry- normal mood and affect    Labs reviewed: Basic Metabolic Panel:  Recent Labs  06/15/16 0709 06/16/16 0355 06/17/16 0332  NA 134* 137 138  K 4.0 4.3 4.1  CL 106 111 111  CO2 24 23 23   GLUCOSE 99 104* 109*  BUN 38* 41* 36*  CREATININE 1.70* 1.48* 1.17  CALCIUM 7.8* 7.8* 7.7*   Liver Function Tests:  Recent Labs  06/14/16 1742 06/15/16 0709 06/17/16 0332  AST 17 14* 31  ALT 15* 12* 22  ALKPHOS 101 79 130*  BILITOT 0.5 0.3 0.4  PROT 4.8* 4.1* 3.9*  ALBUMIN 2.3* 1.9* 1.9*    CBC:  Recent Labs  03/16/16 0544  03/18/16 0837 06/06/16 1531  06/15/16 0709 06/16/16 0335 06/17/16 0332  WBC 2.9*  < > 2.3* 6.0  < > 2.3* 2.4* 2.9*  NEUTROABS 1.1*  --  0.6* 3.4  --   --   --   --   HGB 9.8*  < > 10.4* 12.8*  < > 8.9* 8.7* 9.1*  HCT 30.9*  < > 32.8* 40.5  < > 27.5* 27.1* 28.8*  MCV 89.6  < > 90.1 88.0  < > 84.6 83.6 86.5  PLT 84*  < > 83* 146  < > 105* 121* 119*  < > = values in this interval not displayed. Cardiac Enzymes:  Recent Labs  03/15/16 0410 03/15/16 0934 06/15/16 0709  TROPONINI 0.03 <0.03 <0.03   BNP: Invalid input(s): POCBNP CBG:  Recent Labs  11/29/15 1030 03/05/16 0942  GLUCAP 78 90    Radiological Exams: US Renal  06/14/2016  CLINICAL DATA:  Acute renal failure. Inpatient. History of CLL/SLL. EXAM: RENAL / URINARY TRACT ULTRASOUND COMPLETE COMPARISON:  03/19/2016 renal scintigraphy study. 03/05/2016 PET-CT. 12/17/2015 renal sonogram. FINDINGS: Right Kidney: Length: 14.1 cm. Echogenic right renal parenchyma. Severe chronic right hydroureteronephrosis with right renal pelvis diameter 37 mm, increased from 30 mm on 12/27/2015 sonogram. Marked dilation of the visualized proximal right ureter, which appears more prominent compared to the 12/27/2015 sonogram (25 mm versus 11 mm proximal right ureteral diameter). No right renal mass demonstrated. Left  Kidney: Length: 10.9 cm. Echogenic left renal parenchyma. Mild fullness of the central left renal collecting system without overt left hydronephrosis, not appreciably changed from 03/05/2016 PET-CT. No left renal mass demonstrated. Bladder: Bladder is collapsed by indwelling Foley catheter and cannot be evaluated on this scan. Incompletely visualized prostatomegaly. IMPRESSION: 1. Severe chronic right hydroureteronephrosis, which appears mildly worsened compared to the 12/27/2015 renal sonogram. 2. Chronic mild fullness of the central left renal collecting system without overt left hydronephrosis, unchanged . 3. Echogenic kidneys, indicating nonspecific renal parenchymal disease of uncertain chronicity. 4. Bladder collapsed by indwelling Foley catheter and not evaluated on this scan. 5. Prostatomegaly. Electronically Signed   By: Ilona Sorrel M.D.   On: 06/14/2016 19:26    Assessment/Plan  Physical deconditioning Will have him work with physical therapy and occupational therapy team to help with gait training and muscle strengthening exercises.fall precautions. Skin care. Encourage to be out of bed.   UTI Continue levaquin 250 mg daily x 3 days, encouraged hydration, continue foley care  Chronic hydronephrosis Continue finasteride and foley catheter and monitor urine output  ckd stage 3 Monitor bmp  Lymphoma Continue ibrutinib 420 mg daily, follow with oncology  Protein calorie malnutrition Continue MVI, get RD consult. Continue medpass supplement  gerd Stable, continue famotidine  Hypotension Per pt bp on lower side for last 6 months. Denies any symptom. Not on any antihypertensives. Monitor bp readings.    Goals of care: short term rehabilitation   Labs/tests ordered: cbc, cmp  Family/ staff Communication: reviewed care plan with patient and nursing supervisor    Blanchie Serve, MD Internal Medicine [address], [place] [address] Cell Phone (Monday-Friday 8 am - 5 pm): [phone] On Call: [phone] and follow prompts after 5 pm and on weekends Office Phone: [phone] Office Fax: [phone]

## 2016-06-20 LAB — CBC AND DIFFERENTIAL
HEMATOCRIT: 32 % — AB (ref 41–53)
Hemoglobin: 9.7 g/dL — AB (ref 13.5–17.5)
NEUTROS ABS: 2 /uL
PLATELETS: 73 10*3/uL — AB (ref 150–399)
WBC: 4.5 10*3/mL

## 2016-06-20 LAB — HEPATIC FUNCTION PANEL
ALT: 23 U/L (ref 10–40)
AST: 16 U/L (ref 14–40)
Alkaline Phosphatase: 136 U/L — AB (ref 25–125)
BILIRUBIN, TOTAL: 0.5 mg/dL

## 2016-06-20 LAB — BASIC METABOLIC PANEL
BUN: 32 mg/dL — AB (ref 4–21)
Creatinine: 1.2 mg/dL (ref 0.6–1.3)
GLUCOSE: 92 mg/dL
Potassium: 4.1 mmol/L (ref 3.4–5.3)
Sodium: 141 mmol/L (ref 137–147)

## 2016-07-05 ENCOUNTER — Ambulatory Visit: Payer: Medicare Other | Admitting: Gastroenterology

## 2016-07-10 ENCOUNTER — Non-Acute Institutional Stay (SKILLED_NURSING_FACILITY): Payer: Medicare Other | Admitting: Adult Health

## 2016-07-10 ENCOUNTER — Encounter: Payer: Self-pay | Admitting: Adult Health

## 2016-07-10 DIAGNOSIS — E43 Unspecified severe protein-calorie malnutrition: Secondary | ICD-10-CM | POA: Diagnosis not present

## 2016-07-10 DIAGNOSIS — D638 Anemia in other chronic diseases classified elsewhere: Secondary | ICD-10-CM

## 2016-07-10 DIAGNOSIS — R5381 Other malaise: Secondary | ICD-10-CM | POA: Diagnosis not present

## 2016-07-10 DIAGNOSIS — C8300 Small cell B-cell lymphoma, unspecified site: Secondary | ICD-10-CM

## 2016-07-10 DIAGNOSIS — K219 Gastro-esophageal reflux disease without esophagitis: Secondary | ICD-10-CM | POA: Diagnosis not present

## 2016-07-10 DIAGNOSIS — H8110 Benign paroxysmal vertigo, unspecified ear: Secondary | ICD-10-CM | POA: Diagnosis not present

## 2016-07-10 DIAGNOSIS — N183 Chronic kidney disease, stage 3 unspecified: Secondary | ICD-10-CM

## 2016-07-10 DIAGNOSIS — N1330 Unspecified hydronephrosis: Secondary | ICD-10-CM | POA: Diagnosis not present

## 2016-07-10 NOTE — Progress Notes (Addendum)
Patient ID: Daniel [name], male   DOB: January 09, 1919, 82 y.o.   MRN: [id number]    DATE:  07/10/2016   MRN:  [id number]  BIRTHDAY: [birth year]  Facility:  Nursing Home Location:  [place] Room Number: [id number]  LEVEL OF CARE:  SNF (31)  Contact Information    Name Relation Home Work Hart Friend [phone]  [phone]   Page,Patty Denman George [phone]         Code Status History    Date Active Date Inactive Code Status Order ID Comments User Context   06/14/2016  5:03 PM 06/17/2016  5:55 PM DNR [id number]  [name], MD Inpatient   03/14/2016  4:57 PM 03/19/2016  7:17 PM Full Code [id number]  [name], MD Inpatient   11/09/2014 10:06 AM 11/10/2014  3:35 AM Full Code [id number]  Sandi Mariscal, MD HOV    Questions for Most Recent Historical Code Status (Order [id number])    Question Answer Comment   In the event of cardiac or respiratory ARREST Do not call a "code blue"    In the event of cardiac or respiratory ARREST Do not perform Intubation, CPR, defibrillation or ACLS    In the event of cardiac or respiratory ARREST Use medication by any route, position, wound care, and other measures to relive pain and suffering. May use oxygen, suction and manual treatment of airway obstruction as needed for comfort.         Advance Directive Documentation   Flowsheet Row Most Recent Value  Type of Advance Directive  Out of facility DNR (pink MOST or yellow form)  Pre-existing out of facility DNR order (yellow form or pink MOST form)  No data  "MOST" Form in Place?  No data      Chief Complaint  Patient presents with  . Discharge Notes    HISTORY OF PRESENT ILLNESS:  This is a 82 year old male who is  for discharge home  with Home health PT, OT, CNA, Nursing and Education officer, museum.  DME:  Standard wheelchair with elevating leg rests, anti-tippers, removable arm rests and cushion.  He has been admitted to [facility] on 06/17/16 with deconditioning  from UTI and acute renal failure. He was given IV fluids and antibiotics. He has PMH of lymphoma and chronic hydronephrosis with chronic foley catheter.  Patient was admitted to this facility for short-term rehabilitation after the patient's recent hospitalization.  Patient has completed SNF rehabilitation and therapy has cleared the patient for discharge.   PAST MEDICAL HISTORY:  Past Medical History:  Diagnosis Date  . B12 deficiency 06/06/2016  . BPH (benign prostatic hyperplasia)   . Congenital malformation of spine    T12  . Cough for last 2 years   occasional white sputum  . Diverticulosis   . History of kidney stones 2013  . HOH (hard of hearing)   . Hx of hydronephrosis   . Hydronephrosis 10/27/2014  . NHL (non-Hodgkin's lymphoma) ([place])    nhl dx 5/09;  . Non-Hodgkin lymphoma ([place])   . Prostate enlargement   . Sleep apnea      CURRENT MEDICATIONS: Reviewed  Patient's Medications  New Prescriptions   No medications on file  Previous Medications   ALBUTEROL (PROVENTIL) (2.5 MG/3ML) 0.083% NEBULIZER SOLUTION    Take 3 mLs (2.5 mg total) by nebulization every 6 (six) hours as needed for wheezing or shortness of breath.   FAMOTIDINE (PEPCID) 10 MG TABLET    Take  1 tablet (10 mg total) by mouth daily.   FINASTERIDE (PROSCAR) 5 MG TABLET    Take 5 mg by mouth daily with breakfast.    FISH OIL-OMEGA-3 FATTY ACIDS 1000 MG CAPSULE    Take 1 g by mouth daily with breakfast.    GUAIFENESIN (MUCINEX) 600 MG 12 HR TABLET    Take 1 tablet (600 mg total) by mouth 2 (two) times daily as needed for cough.   HYPROMELLOSE (GENTEAL OP)    Place 1 drop into both eyes 2 (two) times daily. Dry eyes   MECLIZINE (ANTIVERT) 25 MG TABLET    Take 25 mg by mouth 2 (two) times daily as needed for dizziness.   MULTIPLE VITAMINS-MINERALS (ICAPS AREDS 2) CAPS    Take 1 capsule by mouth 2 (two) times daily.   UNABLE TO FIND    Med Name: Med pass 120 mL TID between meals  Modified Medications   No  medications on file  Discontinued Medications   IBRUTINIB (IMBRUVICA) 140 MG CAPSUL    Take 3 capsules (420 mg total) by mouth daily.   LEVOFLOXACIN (LEVAQUIN) 250 MG TABLET    Take 1 tablet (250 mg total) by mouth daily.     No Known Allergies   REVIEW OF SYSTEMS:  GENERAL: no change in appetite, no fatigue, no weight changes, no fever, chills or weakness EYES: Denies change in vision, dry eyes, eye pain, itching or discharge EARS: Denies change in hearing, ringing in ears, or earache NOSE: Denies nasal congestion or epistaxis MOUTH and THROAT: Denies oral discomfort, gingival pain or bleeding, pain from teeth or hoarseness   RESPIRATORY: no cough, SOB, DOE, wheezing, hemoptysis CARDIAC: no chest pain, edema or palpitations GI: no abdominal pain, diarrhea, constipation, heart burn, nausea or vomiting GU: Denies dysuria, frequency, hematuria, incontinence, or discharge PSYCHIATRIC: Denies feeling of depression or anxiety. No report of hallucinations, insomnia, paranoia, or agitation   PHYSICAL EXAMINATION  GENERAL APPEARANCE: Well nourished. In no acute distress. Normal body habitus SKIN:  Skin is warm and dry.  HEAD: Normal in size and contour. No evidence of trauma EYES: Lids open and close normally. No blepharitis, entropion or ectropion. PERRL. Conjunctivae are clear and sclerae are white. Lenses are without opacity EARS: Pinnae are normal. Patient hears normal voice tunes of the examiner MOUTH and THROAT: Lips are without lesions. Oral mucosa is moist and without lesions. Tongue is normal in shape, size, and color and without lesions NECK: supple, trachea midline, no neck masses, no thyroid tenderness, no thyromegaly LYMPHATICS: no LAN in the neck, no supraclavicular LAN RESPIRATORY: breathing is even & unlabored, BS CTAB CARDIAC: RRR, no murmur,no extra heart sounds, LLE trace edema GI: abdomen soft, normal BS, no masses, no tenderness, no hepatomegaly, no splenomegaly GU:    Has foley catheter draining to urine bag EXTREMITIES:  Able to move X 4 extremities PSYCHIATRIC:  Affect and behavior are appropriate  LABS/RADIOLOGY: Labs reviewed: Basic Metabolic Panel:  Recent Labs  06/15/16 0709 06/16/16 0355 06/17/16 0332 06/20/16  NA 134* 137 138 141  K 4.0 4.3 4.1 4.1  CL 106 111 111  --   CO2 24 23 23   --   GLUCOSE 99 104* 109*  --   BUN 38* 41* 36* 32*  CREATININE 1.70* 1.48* 1.17 1.2  CALCIUM 7.8* 7.8* 7.7*  --    Liver Function Tests:  Recent Labs  06/14/16 1742 06/15/16 0709 06/17/16 0332 06/20/16  AST 17 14* 31 16  ALT 15* 12*  22 23  ALKPHOS 101 79 130* 136*  BILITOT 0.5 0.3 0.4  --   PROT 4.8* 4.1* 3.9*  --   ALBUMIN 2.3* 1.9* 1.9*  --    CBC:  Recent Labs  03/18/16 0837 06/06/16 1531  06/15/16 0709 06/16/16 0335 06/17/16 0332 06/20/16  WBC 2.3* 6.0  < > 2.3* 2.4* 2.9* 4.5  NEUTROABS 0.6* 3.4  --   --   --   --  2  HGB 10.4* 12.8*  < > 8.9* 8.7* 9.1* 9.7*  HCT 32.8* 40.5  < > 27.5* 27.1* 28.8* 32*  MCV 90.1 88.0  < > 84.6 83.6 86.5  --   PLT 83* 146  < > 105* 121* 119* 73*  < > = values in this interval not displayed.  Cardiac Enzymes:  Recent Labs  03/15/16 0410 03/15/16 0934 06/15/16 0709  TROPONINI 0.03 <0.03 <0.03   CBG:  Recent Labs  11/29/15 1030 03/05/16 0942  GLUCAP 78 90     US Renal  Result Date: 06/14/2016 CLINICAL DATA:  Acute renal failure. Inpatient. History of CLL/SLL. EXAM: RENAL / URINARY TRACT ULTRASOUND COMPLETE COMPARISON:  03/19/2016 renal scintigraphy study. 03/05/2016 PET-CT. 12/17/2015 renal sonogram. FINDINGS: Right Kidney: Length: 14.1 cm. Echogenic right renal parenchyma. Severe chronic right hydroureteronephrosis with right renal pelvis diameter 37 mm, increased from 30 mm on 12/27/2015 sonogram. Marked dilation of the visualized proximal right ureter, which appears more prominent compared to the 12/27/2015 sonogram (25 mm versus 11 mm proximal right ureteral diameter). No right renal  mass demonstrated. Left Kidney: Length: 10.9 cm. Echogenic left renal parenchyma. Mild fullness of the central left renal collecting system without overt left hydronephrosis, not appreciably changed from 03/05/2016 PET-CT. No left renal mass demonstrated. Bladder: Bladder is collapsed by indwelling Foley catheter and cannot be evaluated on this scan. Incompletely visualized prostatomegaly. IMPRESSION: 1. Severe chronic right hydroureteronephrosis, which appears mildly worsened compared to the 12/27/2015 renal sonogram. 2. Chronic mild fullness of the central left renal collecting system without overt left hydronephrosis, unchanged . 3. Echogenic kidneys, indicating nonspecific renal parenchymal disease of uncertain chronicity. 4. Bladder collapsed by indwelling Foley catheter and not evaluated on this scan. 5. Prostatomegaly. Electronically Signed   By: Ilona Sorrel M.D.   On: 06/14/2016 19:26    ASSESSMENT/PLAN:  Physical deconditioning - for Home health PT, OT, CNA, Nursing and Social Worker  UTI - resolved  Chronic hydronephrosis - continue Proscar 5 mg 1 tab PO Q D  Lymphoma - continue Ibrutinib 140 mg take 3 tabs = 420 mg daily  GERD - continue Pepcid AC 10 mg 1 tab daily  Protein-calorie malnutrition, severe - albumin 1.9; referred to RD; continue Medpass 120 ml TID  Vertigo - continue Meclizine 25 mg 1 tab BID PRN  Anemia of chronic disease - stable Lab Results  Component Value Date   HGB 9.7 (A) 06/20/2016   Chronic kidney disease, stage 3 - stable Lab Results  Component Value Date   CREATININE 1.2 06/20/2016       I have filled out patient's discharge paperwork and written prescriptions.  Patient will receive home health PT, OT, Social Worker, Nursing and CNA.  DME provided:  Standard wheelchair with elevating leg rests, anti-tippers, removable arm rests and cushion  Total discharge time: Greater than 30 minutes  Discharge time involved coordination of the discharge  process with social worker, nursing staff and therapy department. Medical justification for home health services/DME verified.    Durenda Age, NP [facility] [phone]

## 2016-07-12 ENCOUNTER — Telehealth: Payer: Self-pay | Admitting: Gastroenterology

## 2016-07-12 NOTE — Telephone Encounter (Signed)
The pt has dysphagia to solids and liquids and states Dr Ardis Hughs told him to call for repeat EGD if problem returned.  Dr Ardis Hughs, do you want to schedule EGD or office visit?

## 2016-07-15 NOTE — Telephone Encounter (Signed)
He is very old. Has lymphoma and recently spent several days in SNF for deconditioning.  He needs rov before scheduling any procedures.  Me or extender in next 5-7 days.  Thanks

## 2016-07-16 NOTE — Telephone Encounter (Signed)
The pt has been scheduled for office visit and has been notified.

## 2016-07-27 ENCOUNTER — Encounter (HOSPITAL_COMMUNITY): Payer: Self-pay | Admitting: Emergency Medicine

## 2016-07-27 ENCOUNTER — Inpatient Hospital Stay (HOSPITAL_COMMUNITY)
Admission: EM | Admit: 2016-07-27 | Discharge: 2016-08-02 | DRG: 699 | Disposition: A | Discharge status: Skilled Nursing Facility | Payer: Medicare Other | Attending: Family Medicine | Admitting: Family Medicine

## 2016-07-27 DIAGNOSIS — R05 Cough: Secondary | ICD-10-CM

## 2016-07-27 DIAGNOSIS — I959 Hypotension, unspecified: Secondary | ICD-10-CM | POA: Diagnosis present

## 2016-07-27 DIAGNOSIS — D631 Anemia in chronic kidney disease: Secondary | ICD-10-CM | POA: Diagnosis present

## 2016-07-27 DIAGNOSIS — N390 Urinary tract infection, site not specified: Secondary | ICD-10-CM | POA: Diagnosis present

## 2016-07-27 DIAGNOSIS — Z9049 Acquired absence of other specified parts of digestive tract: Secondary | ICD-10-CM

## 2016-07-27 DIAGNOSIS — I5032 Chronic diastolic (congestive) heart failure: Secondary | ICD-10-CM | POA: Diagnosis present

## 2016-07-27 DIAGNOSIS — D630 Anemia in neoplastic disease: Secondary | ICD-10-CM | POA: Diagnosis present

## 2016-07-27 DIAGNOSIS — N1330 Unspecified hydronephrosis: Secondary | ICD-10-CM | POA: Diagnosis present

## 2016-07-27 DIAGNOSIS — S0101XA Laceration without foreign body of scalp, initial encounter: Secondary | ICD-10-CM | POA: Diagnosis not present

## 2016-07-27 DIAGNOSIS — I10 Essential (primary) hypertension: Secondary | ICD-10-CM | POA: Diagnosis present

## 2016-07-27 DIAGNOSIS — N183 Chronic kidney disease, stage 3 unspecified: Secondary | ICD-10-CM | POA: Diagnosis present

## 2016-07-27 DIAGNOSIS — J449 Chronic obstructive pulmonary disease, unspecified: Secondary | ICD-10-CM | POA: Diagnosis present

## 2016-07-27 DIAGNOSIS — B965 Pseudomonas (aeruginosa) (mallei) (pseudomallei) as the cause of diseases classified elsewhere: Secondary | ICD-10-CM | POA: Diagnosis present

## 2016-07-27 DIAGNOSIS — Z66 Do not resuscitate: Secondary | ICD-10-CM | POA: Diagnosis present

## 2016-07-27 DIAGNOSIS — Z79899 Other long term (current) drug therapy: Secondary | ICD-10-CM

## 2016-07-27 DIAGNOSIS — Z6824 Body mass index (BMI) 24.0-24.9, adult: Secondary | ICD-10-CM

## 2016-07-27 DIAGNOSIS — R059 Cough, unspecified: Secondary | ICD-10-CM

## 2016-07-27 DIAGNOSIS — T83518A Infection and inflammatory reaction due to other urinary catheter, initial encounter: Secondary | ICD-10-CM | POA: Diagnosis not present

## 2016-07-27 DIAGNOSIS — Y92009 Unspecified place in unspecified non-institutional (private) residence as the place of occurrence of the external cause: Secondary | ICD-10-CM

## 2016-07-27 DIAGNOSIS — E46 Unspecified protein-calorie malnutrition: Secondary | ICD-10-CM | POA: Diagnosis present

## 2016-07-27 DIAGNOSIS — Z87891 Personal history of nicotine dependence: Secondary | ICD-10-CM

## 2016-07-27 DIAGNOSIS — N179 Acute kidney failure, unspecified: Secondary | ICD-10-CM | POA: Diagnosis present

## 2016-07-27 DIAGNOSIS — C8300 Small cell B-cell lymphoma, unspecified site: Secondary | ICD-10-CM | POA: Diagnosis present

## 2016-07-27 DIAGNOSIS — H9190 Unspecified hearing loss, unspecified ear: Secondary | ICD-10-CM | POA: Diagnosis present

## 2016-07-27 DIAGNOSIS — W010XXA Fall on same level from slipping, tripping and stumbling without subsequent striking against object, initial encounter: Secondary | ICD-10-CM | POA: Diagnosis present

## 2016-07-27 DIAGNOSIS — N400 Enlarged prostate without lower urinary tract symptoms: Secondary | ICD-10-CM | POA: Diagnosis present

## 2016-07-27 DIAGNOSIS — S0181XA Laceration without foreign body of other part of head, initial encounter: Secondary | ICD-10-CM

## 2016-07-27 DIAGNOSIS — Z87442 Personal history of urinary calculi: Secondary | ICD-10-CM

## 2016-07-27 DIAGNOSIS — Q7649 Other congenital malformations of spine, not associated with scoliosis: Secondary | ICD-10-CM

## 2016-07-27 DIAGNOSIS — W19XXXA Unspecified fall, initial encounter: Secondary | ICD-10-CM

## 2016-07-27 DIAGNOSIS — E538 Deficiency of other specified B group vitamins: Secondary | ICD-10-CM | POA: Diagnosis present

## 2016-07-27 DIAGNOSIS — C8590 Non-Hodgkin lymphoma, unspecified, unspecified site: Secondary | ICD-10-CM | POA: Diagnosis present

## 2016-07-27 DIAGNOSIS — D696 Thrombocytopenia, unspecified: Secondary | ICD-10-CM | POA: Diagnosis present

## 2016-07-27 DIAGNOSIS — Y846 Urinary catheterization as the cause of abnormal reaction of the patient, or of later complication, without mention of misadventure at the time of the procedure: Secondary | ICD-10-CM | POA: Diagnosis present

## 2016-07-27 DIAGNOSIS — E872 Acidosis: Secondary | ICD-10-CM | POA: Diagnosis present

## 2016-07-27 NOTE — ED Provider Notes (Signed)
[facility] DEPT Provider Note   CSN: [id number] Arrival date & time: 07/27/16  2349  By signing my name below, I, Georgette Shell, attest that this documentation has been prepared under the direction and in the presence of Leo Grosser, MD. Electronically Signed: Georgette Shell, ED Scribe. 07/28/16. 12:18 AM.  History   Chief Complaint Chief Complaint  Patient presents with  . Fall   HPI Comments: OMAIR CHHABRA is a 82 y.o. male who presents to the Emergency Department by EMS complaining for a laceration to right side of head s/p mechanical fall. Pt states that he tripped on his sock and was on the ground for approximately 30 minutes. No LOC. Denies head injury. Pt lives at home by himself. He is ambulatory with a walker. Pt is not on blood thinners. Pt denies any additional injuries. He denies any pain at this time.  The history is provided by the patient. No language interpreter was used.   Past Medical History:  Diagnosis Date  . B12 deficiency 06/06/2016  . BPH (benign prostatic hyperplasia)   . Congenital malformation of spine    T12  . Cough for last 2 years   occasional white sputum  . Diverticulosis   . History of kidney stones 2013  . HOH (hard of hearing)   . Hx of hydronephrosis   . Hydronephrosis 10/27/2014  . NHL (non-Hodgkin's lymphoma) ([place])    nhl dx 5/09;  . Non-Hodgkin lymphoma ([place])   . Prostate enlargement   . Sleep apnea    Patient Active Problem List   Diagnosis Date Noted  . UTI (urinary tract infection) 06/15/2016  . Acute renal failure ([place]) 06/14/2016  . Acute renal failure (ARF) ([place]) 06/14/2016  . Bilateral leg edema 06/07/2016  . Pancytopenia, acquired ([place]) 06/06/2016  . B12 deficiency 06/06/2016  . Acute respiratory failure ([place]) 03/19/2016  . Protein-calorie malnutrition, severe 03/15/2016  . Hypoxia 03/14/2016  . Lesion of left lung 03/07/2016  . Protein-calorie malnutrition, moderate ([place]) 03/07/2016  . Thrombocytopenia ([place]) 06/01/2015  .  Hernia, inguinal, right 06/01/2015  . Essential hypertension 06/01/2015  . Chronic kidney disease (CKD), stage III (moderate) 01/10/2015  . 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 Preventive measure 11/14/2014  . Hydronephrosis, right 10/27/2014  . Cellulitis of arm, right 02/11/2014  . Bronchiectasis without acute exacerbation ([place]) 06/09/2013  . Hydroureteronephrosis 05/12/2012  . Lymphoma, small lymphocytic ([place]) 11/10/2011  . 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 CHRONIC AIRWAY OBSTRUCTION NEC 09/11/2009   Past Surgical History:  Procedure Laterality Date  . BOTOX INJECTION N/A 07/15/2013   Procedure: BOTOX INJECTION;  Surgeon: Milus Banister, MD;  Location: [facility] ENDOSCOPY;  Service: Endoscopy;  Laterality: N/A;  . CHOLECYSTECTOMY  yrs ago  . ESOPHAGOGASTRODUODENOSCOPY (EGD) WITH PROPOFOL N/A 07/15/2013   Procedure: ESOPHAGOGASTRODUODENOSCOPY (EGD) WITH PROPOFOL;  Surgeon: Milus Banister, MD;  Location: [facility] ENDOSCOPY;  Service: Endoscopy;  Laterality: N/A;  botox injection  . EYE SURGERY  yrs ago   Implants bilateral  . INGUINAL HERNIA REPAIR  yrs ago   left  . PROSTATE SURGERY  yrs ago     Home Medications    Prior to Admission medications   Medication Sig Start Date End Date Taking? Authorizing Provider  albuterol (PROVENTIL) (2.5 MG/3ML) 0.083% nebulizer solution Take 3 mLs (2.5 mg total) by nebulization every 6 (six) hours as needed for wheezing or shortness of breath. 03/19/16   [name], MD  famotidine (PEPCID) 10 MG tablet Take 1 tablet (10 mg total) by mouth  daily. 06/17/16   [name], MD  finasteride (PROSCAR) 5 MG tablet Take 5 mg by mouth daily with breakfast.     Historical Provider, MD  fish oil-omega-3 fatty acids 1000 MG capsule Take 1 g by mouth daily with breakfast.     Historical Provider, MD  guaiFENesin (MUCINEX) 600 MG 12 hr tablet Take 1 tablet (600 mg total) by mouth 2 (two) times daily as needed for cough. 06/17/16   [name], MD  Hypromellose (GENTEAL OP) Place 1  drop into both eyes 2 (two) times daily. Dry eyes    Historical Provider, MD  meclizine (ANTIVERT) 25 MG tablet Take 25 mg by mouth 2 (two) times daily as needed for dizziness.    Historical Provider, MD  Multiple Vitamins-Minerals (ICAPS AREDS 2) CAPS Take 1 capsule by mouth 2 (two) times daily.    Historical Provider, MD  UNABLE TO FIND Med Name: Med pass 120 mL TID between meals    Historical Provider, MD    Family History Family History  Problem Relation Age of Onset  . Tuberculosis Mother   . Blindness Father     Social History Social History  Substance Use Topics  . Smoking status: Former Smoker    Packs/day: 0.25    Years: 3.00    Types: Pipe    Quit date: 12/10/1959  . Smokeless tobacco: Never Used     Comment: Pt states that he never inhaled. Pt also states that while in service he smoked cigs x 2 months.   . Alcohol use 0.6 oz/week    1 Glasses of wine per week     Comment: occasional     Allergies   Review of patient's allergies indicates no known allergies.   Review of Systems Review of Systems  Constitutional: Negative for fever.  Skin: Positive for wound.  Neurological: Negative for syncope.  All other systems reviewed and are negative.    Physical Exam Updated Vital Signs BP (!) 90/47 (BP Location: Left Arm)   Pulse 105   Temp 98.4 F (36.9 C) (Oral)   Resp 18   SpO2 99%   Physical Exam  Constitutional: He appears well-developed and well-nourished.  HENT:  Head: Normocephalic.  2 cm skin tear on the right forehead at the hairline. Dried blood present, otherwise no neurologic deficit.  Eyes: Conjunctivae are normal.  Cardiovascular: Normal rate.   Pulmonary/Chest: Effort normal. No respiratory distress.  Abdominal: Soft. He exhibits no distension.  3 cm periumbilical hernia that is easily reduced and soft.  Musculoskeletal: Normal range of motion.       Right shoulder: He exhibits laceration (3 cm skin tear not violating dermis laterally).    Neurological: He is alert.  Skin: Skin is warm and dry.  Psychiatric: He has a normal mood and affect. His behavior is normal.  Nursing note and vitals reviewed.  ED Treatments / Results  DIAGNOSTIC STUDIES: Oxygen Saturation is 99% on RA, normal by my interpretation.    COORDINATION OF CARE: 12:14 AM Discussed treatment plan with pt at bedside which includes IVF, lab work, and wound care and pt agreed to plan.  Labs (all labs ordered are listed, but only abnormal results are displayed) Labs Reviewed  CBC WITH DIFFERENTIAL/PLATELET - Abnormal; Notable for the following:       Result Value   RBC 3.17 (*)    Hemoglobin 8.1 (*)    HCT 26.2 (*)    MCH 25.6 (*)    RDW 18.8 (*)  Platelets 124 (*)    All other components within normal limits  BASIC METABOLIC PANEL - Abnormal; Notable for the following:    Glucose, Bld 116 (*)    BUN 43 (*)    Creatinine, Ser 2.40 (*)    Calcium 8.0 (*)    GFR calc non Af Amer 22 (*)    GFR calc Af Amer 25 (*)    All other components within normal limits  URINE CULTURE  URINALYSIS, ROUTINE W REFLEX MICROSCOPIC (NOT AT [facility])    EKG  EKG Interpretation None       Radiology No results found.  Procedures Procedures (including critical care time)  LACERATION REPAIR Performed by: Leo Grosser Authorized by: Leo Grosser Consent: Verbal consent obtained. Risks and benefits: risks, benefits and alternatives were discussed Consent given by: patient Patient identity confirmed: provided demographic data Prepped and Draped in normal sterile fashion Wound explored  Laceration Location: right forehead  Laceration Length: 2 cm  No Foreign Bodies seen or palpated  Irrigation method: syringe Amount of cleaning: standard  Skin closure: glue  Number of sutures: n/a  Technique: simple  Patient tolerance: Patient tolerated the procedure well with no immediate complications.  Medications Ordered in ED Medications  cefTRIAXone  (ROCEPHIN) 1 g in dextrose 5 % 50 mL IVPB (not administered)  sodium chloride 0.9 % bolus 1,000 mL (0 mLs Intravenous Stopped 07/28/16 0208)  sodium chloride 0.9 % bolus 1,000 mL (1,000 mLs Intravenous New Bag/Given 07/28/16 0205)     Initial Impression / Assessment and Plan / ED Course  I have reviewed the triage vital signs and the nursing notes.  Pertinent labs & imaging results that were available during my care of the patient were reviewed by me and considered in my medical decision making (see chart for details).  Clinical Course    82 y.o. male presents with Mechanical fall after his left foot got caught under him and he went to the floor. He sustained a small skin tear to the right side of his face on his forehead, had no loss of consciousness, no amnesia to events, could not raise himself off of the floor so called for transport to the hospital to repair his face. I am concerned that the patient has had ongoing issues with hypotension at home. He refused home health and placement multiple times as he wishes to continue to live alone. He does have some ongoing a symptomatic hypotension here. He is not anticoagulated and given the limited nature of his injury without active symptoms I do not feel that neuro imaging is indicated acutely. Labs are concerning for development of acute kidney injury which is presumably prerenal but urine appears purulent with chronic Foley use so we'll cover for UTI. No fever, no signs of developing sepsis currently. Hospitalist was consulted for admission and will see the patient in the emergency department.   Final Clinical Impressions(s) / ED Diagnoses   Final diagnoses:  Hypotension, unspecified hypotension type  Fall, initial encounter  Facial laceration, initial encounter  AKI (acute kidney injury) ([place])  UTI (lower urinary tract infection)    New Prescriptions New Prescriptions   No medications on file   I personally performed the services  described in this documentation, which was scribed in my presence. The recorded information has been reviewed and is accurate.      Leo Grosser, MD 07/28/16 [phone]

## 2016-07-27 NOTE — ED Triage Notes (Addendum)
Per EMS pt from home had a fall. States he tripped on socks, was on ground for approx 30 minutes. Laceration to right side of head. States no LOC. A&Ox4. Denies any pain. Denies blood thinners.  Pt was at [facility] and was discharged due to getting home health. Pt was refused home health and is now living by himself.

## 2016-07-27 NOTE — ED Notes (Signed)
Bed: WA02 Expected date:  Expected time:  Means of arrival:  Comments: 89 fall head inj

## 2016-07-28 ENCOUNTER — Observation Stay (HOSPITAL_COMMUNITY): Payer: Medicare Other

## 2016-07-28 ENCOUNTER — Encounter (HOSPITAL_COMMUNITY): Payer: Self-pay | Admitting: Family Medicine

## 2016-07-28 DIAGNOSIS — N183 Chronic kidney disease, stage 3 (moderate): Secondary | ICD-10-CM | POA: Diagnosis present

## 2016-07-28 DIAGNOSIS — N390 Urinary tract infection, site not specified: Secondary | ICD-10-CM

## 2016-07-28 DIAGNOSIS — T83518A Infection and inflammatory reaction due to other urinary catheter, initial encounter: Secondary | ICD-10-CM | POA: Diagnosis present

## 2016-07-28 DIAGNOSIS — Y92009 Unspecified place in unspecified non-institutional (private) residence as the place of occurrence of the external cause: Secondary | ICD-10-CM | POA: Diagnosis not present

## 2016-07-28 DIAGNOSIS — N179 Acute kidney failure, unspecified: Secondary | ICD-10-CM | POA: Diagnosis present

## 2016-07-28 DIAGNOSIS — E538 Deficiency of other specified B group vitamins: Secondary | ICD-10-CM | POA: Diagnosis present

## 2016-07-28 DIAGNOSIS — I10 Essential (primary) hypertension: Secondary | ICD-10-CM

## 2016-07-28 DIAGNOSIS — Z87891 Personal history of nicotine dependence: Secondary | ICD-10-CM | POA: Diagnosis not present

## 2016-07-28 DIAGNOSIS — S0101XA Laceration without foreign body of scalp, initial encounter: Secondary | ICD-10-CM | POA: Diagnosis present

## 2016-07-28 DIAGNOSIS — E872 Acidosis: Secondary | ICD-10-CM | POA: Diagnosis present

## 2016-07-28 DIAGNOSIS — D630 Anemia in neoplastic disease: Secondary | ICD-10-CM

## 2016-07-28 DIAGNOSIS — Z87442 Personal history of urinary calculi: Secondary | ICD-10-CM | POA: Diagnosis not present

## 2016-07-28 DIAGNOSIS — H9190 Unspecified hearing loss, unspecified ear: Secondary | ICD-10-CM | POA: Diagnosis present

## 2016-07-28 DIAGNOSIS — D631 Anemia in chronic kidney disease: Secondary | ICD-10-CM | POA: Diagnosis present

## 2016-07-28 DIAGNOSIS — N1330 Unspecified hydronephrosis: Secondary | ICD-10-CM | POA: Diagnosis present

## 2016-07-28 DIAGNOSIS — W010XXA Fall on same level from slipping, tripping and stumbling without subsequent striking against object, initial encounter: Secondary | ICD-10-CM | POA: Diagnosis present

## 2016-07-28 DIAGNOSIS — I5032 Chronic diastolic (congestive) heart failure: Secondary | ICD-10-CM

## 2016-07-28 DIAGNOSIS — S0181XA Laceration without foreign body of other part of head, initial encounter: Secondary | ICD-10-CM

## 2016-07-28 DIAGNOSIS — B965 Pseudomonas (aeruginosa) (mallei) (pseudomallei) as the cause of diseases classified elsewhere: Secondary | ICD-10-CM | POA: Diagnosis present

## 2016-07-28 DIAGNOSIS — Z66 Do not resuscitate: Secondary | ICD-10-CM | POA: Diagnosis present

## 2016-07-28 DIAGNOSIS — E46 Unspecified protein-calorie malnutrition: Secondary | ICD-10-CM | POA: Diagnosis present

## 2016-07-28 DIAGNOSIS — N400 Enlarged prostate without lower urinary tract symptoms: Secondary | ICD-10-CM | POA: Diagnosis present

## 2016-07-28 DIAGNOSIS — Q7649 Other congenital malformations of spine, not associated with scoliosis: Secondary | ICD-10-CM | POA: Diagnosis not present

## 2016-07-28 DIAGNOSIS — I959 Hypotension, unspecified: Secondary | ICD-10-CM | POA: Diagnosis not present

## 2016-07-28 DIAGNOSIS — Y846 Urinary catheterization as the cause of abnormal reaction of the patient, or of later complication, without mention of misadventure at the time of the procedure: Secondary | ICD-10-CM | POA: Diagnosis present

## 2016-07-28 DIAGNOSIS — J449 Chronic obstructive pulmonary disease, unspecified: Secondary | ICD-10-CM | POA: Diagnosis present

## 2016-07-28 DIAGNOSIS — C8590 Non-Hodgkin lymphoma, unspecified, unspecified site: Secondary | ICD-10-CM | POA: Diagnosis present

## 2016-07-28 DIAGNOSIS — D696 Thrombocytopenia, unspecified: Secondary | ICD-10-CM | POA: Diagnosis present

## 2016-07-28 DIAGNOSIS — Z9049 Acquired absence of other specified parts of digestive tract: Secondary | ICD-10-CM | POA: Diagnosis not present

## 2016-07-28 DIAGNOSIS — C8300 Small cell B-cell lymphoma, unspecified site: Secondary | ICD-10-CM

## 2016-07-28 DIAGNOSIS — Z6824 Body mass index (BMI) 24.0-24.9, adult: Secondary | ICD-10-CM | POA: Diagnosis not present

## 2016-07-28 LAB — PROCALCITONIN: PROCALCITONIN: 0.37 ng/mL

## 2016-07-28 LAB — CBC WITH DIFFERENTIAL/PLATELET
BASOS ABS: 0 10*3/uL (ref 0.0–0.1)
BASOS ABS: 0 10*3/uL (ref 0.0–0.1)
BASOS PCT: 0 %
BASOS PCT: 0 %
EOS ABS: 0 10*3/uL (ref 0.0–0.7)
EOS ABS: 0 10*3/uL (ref 0.0–0.7)
EOS PCT: 0 %
EOS PCT: 0 %
HCT: 26.2 % — ABNORMAL LOW (ref 39.0–52.0)
HCT: 25 % — ABNORMAL LOW (ref 39.0–52.0)
Hemoglobin: 8.1 g/dL — ABNORMAL LOW (ref 13.0–17.0)
Hemoglobin: 7.6 g/dL — ABNORMAL LOW (ref 13.0–17.0)
Lymphocytes Relative: 17 %
Lymphocytes Relative: 28 %
Lymphs Abs: 1.2 10*3/uL (ref 0.7–4.0)
Lymphs Abs: 1.5 10*3/uL (ref 0.7–4.0)
MCH: 25.6 pg — ABNORMAL LOW (ref 26.0–34.0)
MCH: 26.2 pg (ref 26.0–34.0)
MCHC: 30.9 g/dL (ref 30.0–36.0)
MCHC: 30.4 g/dL (ref 30.0–36.0)
MCV: 82.6 fL (ref 78.0–100.0)
MCV: 86.2 fL (ref 78.0–100.0)
MONO ABS: 0.7 10*3/uL (ref 0.1–1.0)
Monocytes Absolute: 0.8 10*3/uL (ref 0.1–1.0)
Monocytes Relative: 11 %
Monocytes Relative: 13 %
Neutro Abs: 5.2 10*3/uL (ref 1.7–7.7)
Neutro Abs: 3.1 10*3/uL (ref 1.7–7.7)
Neutrophils Relative %: 72 %
Neutrophils Relative %: 59 %
PLATELETS: 124 10*3/uL — AB (ref 150–400)
PLATELETS: 124 10*3/uL — AB (ref 150–400)
RBC: 3.17 MIL/uL — AB (ref 4.22–5.81)
RBC: 2.9 MIL/uL — AB (ref 4.22–5.81)
RDW: 18.8 % — ABNORMAL HIGH (ref 11.5–15.5)
RDW: 19.3 % — AB (ref 11.5–15.5)
WBC: 7.2 10*3/uL (ref 4.0–10.5)
WBC: 5.4 10*3/uL (ref 4.0–10.5)

## 2016-07-28 LAB — CREATININE, URINE, RANDOM: Creatinine, Urine: 59.21 mg/dL

## 2016-07-28 LAB — BASIC METABOLIC PANEL
ANION GAP: 6 (ref 5–15)
Anion gap: 6 (ref 5–15)
BUN: 43 mg/dL — AB (ref 6–20)
BUN: 38 mg/dL — AB (ref 6–20)
CALCIUM: 7.5 mg/dL — AB (ref 8.9–10.3)
CO2: 24 mmol/L (ref 22–32)
CO2: 23 mmol/L (ref 22–32)
CREATININE: 2.01 mg/dL — AB (ref 0.61–1.24)
Calcium: 8 mg/dL — ABNORMAL LOW (ref 8.9–10.3)
Chloride: 105 mmol/L (ref 101–111)
Chloride: 108 mmol/L (ref 101–111)
Creatinine, Ser: 2.4 mg/dL — ABNORMAL HIGH (ref 0.61–1.24)
GFR calc Af Amer: 31 mL/min — ABNORMAL LOW (ref >60)
GFR, EST AFRICAN AMERICAN: 25 mL/min — AB (ref >60)
GFR, EST NON AFRICAN AMERICAN: 22 mL/min — AB (ref >60)
GFR, EST NON AFRICAN AMERICAN: 27 mL/min — AB (ref >60)
GLUCOSE: 123 mg/dL — AB (ref 65–99)
Glucose, Bld: 116 mg/dL — ABNORMAL HIGH (ref 65–99)
POTASSIUM: 4.5 mmol/L (ref 3.5–5.1)
Potassium: 4.1 mmol/L (ref 3.5–5.1)
SODIUM: 135 mmol/L (ref 135–145)
SODIUM: 137 mmol/L (ref 135–145)

## 2016-07-28 LAB — PROTIME-INR
INR: 1.24
Prothrombin Time: 15.6 seconds — ABNORMAL HIGH (ref 11.4–15.2)

## 2016-07-28 LAB — URINALYSIS, ROUTINE W REFLEX MICROSCOPIC
Bilirubin Urine: NEGATIVE
Glucose, UA: NEGATIVE mg/dL
Ketones, ur: NEGATIVE mg/dL
NITRITE: NEGATIVE
PROTEIN: 100 mg/dL — AB
Specific Gravity, Urine: 1.014 (ref 1.005–1.030)
pH: 6 (ref 5.0–8.0)

## 2016-07-28 LAB — URINE MICROSCOPIC-ADD ON

## 2016-07-28 LAB — LACTIC ACID, PLASMA
LACTIC ACID, VENOUS: 2.5 mmol/L — AB (ref 0.5–1.9)
LACTIC ACID, VENOUS: 1.9 mmol/L (ref 0.5–1.9)

## 2016-07-28 LAB — TYPE AND SCREEN
ABO/RH(D): O POS
ANTIBODY SCREEN: NEGATIVE

## 2016-07-28 LAB — GLUCOSE, CAPILLARY: Glucose-Capillary: 117 mg/dL — ABNORMAL HIGH (ref 65–99)

## 2016-07-28 LAB — NA AND K (SODIUM & POTASSIUM), RAND UR
Potassium Urine: 34 mmol/L
SODIUM UR: 70 mmol/L

## 2016-07-28 LAB — APTT: aPTT: 33 seconds (ref 24–36)

## 2016-07-28 LAB — ABO/RH: ABO/RH(D): O POS

## 2016-07-28 MED ORDER — POLYVINYL ALCOHOL 1.4 % OP SOLN
1.0000 [drp] | Freq: Two times a day (BID) | OPHTHALMIC | Status: DC
  Administered 2016-07-28 – 2016-08-02 (×11): 1 [drp] via OPHTHALMIC
  Filled 2016-07-28: qty 15

## 2016-07-28 MED ORDER — SODIUM CHLORIDE 0.9 % IV BOLUS (SEPSIS)
1000.0000 mL | Freq: Once | INTRAVENOUS | Status: AC
  Administered 2016-07-28: 1000 mL via INTRAVENOUS

## 2016-07-28 MED ORDER — IBRUTINIB 140 MG PO CAPS: 420.0000 mg | ORAL_CAPSULE | Freq: Every morning | ORAL | Status: DC

## 2016-07-28 MED ORDER — ALBUTEROL SULFATE (2.5 MG/3ML) 0.083% IN NEBU
2.5000 mg | INHALATION_SOLUTION | RESPIRATORY_TRACT | Status: DC | PRN
  Administered 2016-08-02: 2.5 mg via RESPIRATORY_TRACT
  Filled 2016-07-28: qty 3

## 2016-07-28 MED ORDER — FAMOTIDINE IN NACL 20-0.9 MG/50ML-% IV SOLN
20.0000 mg | Freq: Two times a day (BID) | INTRAVENOUS | Status: DC
  Administered 2016-07-28 – 2016-07-29 (×3): 20 mg via INTRAVENOUS
  Filled 2016-07-28 (×3): qty 50

## 2016-07-28 MED ORDER — CHLORHEXIDINE GLUCONATE 0.12 % MT SOLN
15.0000 mL | Freq: Two times a day (BID) | OROMUCOSAL | Status: DC
  Administered 2016-07-28 – 2016-08-01 (×10): 15 mL via OROMUCOSAL
  Filled 2016-07-28 (×9): qty 15

## 2016-07-28 MED ORDER — BISACODYL 5 MG PO TBEC: 5.0000 mg | DELAYED_RELEASE_TABLET | Freq: Every day | ORAL | Status: DC | PRN

## 2016-07-28 MED ORDER — PROSIGHT PO TABS
1.0000 | ORAL_TABLET | Freq: Two times a day (BID) | ORAL | Status: DC
  Administered 2016-07-28 – 2016-08-02 (×11): 1 via ORAL
  Filled 2016-07-28 (×12): qty 1

## 2016-07-28 MED ORDER — DEXTROSE 5 % IV SOLN
1.0000 g | Freq: Once | INTRAVENOUS | Status: AC
  Administered 2016-07-28: 1 g via INTRAVENOUS
  Filled 2016-07-28: qty 10

## 2016-07-28 MED ORDER — ONDANSETRON HCL 4 MG PO TABS: 4.0000 mg | ORAL_TABLET | Freq: Four times a day (QID) | ORAL | Status: DC | PRN

## 2016-07-28 MED ORDER — HYDROCODONE-ACETAMINOPHEN 5-325 MG PO TABS: 1.0000 | ORAL_TABLET | ORAL | Status: DC | PRN

## 2016-07-28 MED ORDER — ACETAMINOPHEN 325 MG PO TABS: 650.0000 mg | ORAL_TABLET | Freq: Four times a day (QID) | ORAL | Status: DC | PRN

## 2016-07-28 MED ORDER — SODIUM CHLORIDE 0.9% FLUSH
3.0000 mL | Freq: Two times a day (BID) | INTRAVENOUS | Status: DC
  Administered 2016-07-28 – 2016-08-01 (×6): 3 mL via INTRAVENOUS

## 2016-07-28 MED ORDER — ONDANSETRON HCL 4 MG/2ML IJ SOLN: 4.0000 mg | Freq: Four times a day (QID) | INTRAMUSCULAR | Status: DC | PRN

## 2016-07-28 MED ORDER — OMEGA-3-ACID ETHYL ESTERS 1 G PO CAPS
1.0000 g | ORAL_CAPSULE | Freq: Every day | ORAL | Status: DC
  Administered 2016-07-28 – 2016-08-02 (×6): 1 g via ORAL
  Filled 2016-07-28 (×6): qty 1

## 2016-07-28 MED ORDER — CETYLPYRIDINIUM CHLORIDE 0.05 % MT LIQD
7.0000 mL | Freq: Two times a day (BID) | OROMUCOSAL | Status: DC
  Administered 2016-07-28 – 2016-07-30 (×5): 7 mL via OROMUCOSAL

## 2016-07-28 MED ORDER — ACETAMINOPHEN 650 MG RE SUPP: 650.0000 mg | Freq: Four times a day (QID) | RECTAL | Status: DC | PRN

## 2016-07-28 MED ORDER — GLUCOSAMINE-CHONDROITIN 500-400 MG PO TABS: 1.0000 | ORAL_TABLET | Freq: Every morning | ORAL | Status: DC

## 2016-07-28 MED ORDER — POLYETHYLENE GLYCOL 3350 17 G PO PACK
17.0000 g | PACK | Freq: Every day | ORAL | Status: DC | PRN
  Filled 2016-07-28: qty 1

## 2016-07-28 MED ORDER — LIDOCAINE-EPINEPHRINE 2 %-1:100000 IJ SOLN: 10.0000 mL | Freq: Once | INTRAMUSCULAR | Status: DC

## 2016-07-28 MED ORDER — FINASTERIDE 5 MG PO TABS
5.0000 mg | ORAL_TABLET | Freq: Every day | ORAL | Status: DC
  Administered 2016-07-28 – 2016-08-02 (×6): 5 mg via ORAL
  Filled 2016-07-28 (×6): qty 1

## 2016-07-28 MED ORDER — SODIUM CHLORIDE 0.9 % IV SOLN
INTRAVENOUS | Status: AC
  Administered 2016-07-28 – 2016-07-31 (×7): via INTRAVENOUS

## 2016-07-28 MED ORDER — DEXTROSE 5 % IV SOLN
1.0000 g | Freq: Every day | INTRAVENOUS | Status: DC
  Administered 2016-07-28 – 2016-08-02 (×6): 1 g via INTRAVENOUS
  Filled 2016-07-28 (×6): qty 1

## 2016-07-28 NOTE — Progress Notes (Signed)
MEDICATION RELATED CONSULT NOTE - INITIAL   Pharmacy Consult for ibrutinib Indication: Oral Chemo  No Known Allergies  Patient Measurements: Weight: 130 lb 4.7 oz (59.1 kg) Adjusted Body Weight:   Vital Signs: Temp: 97.6 F (36.4 C) (08/20 0409) Temp Source: Oral (08/20 0409) BP: 94/45 (08/20 0409) Pulse Rate: 72 (08/20 0409) Intake/Output from previous day: 08/19 0701 - 08/20 0700 In: 250 [P.O.:120; I.V.:130] Out: 150 [Urine:150] Intake/Output from this shift: Total I/O In: 250 [P.O.:120; I.V.:130] Out: 150 [Urine:150]  Labs:  Recent Labs  07/28/16 0115  WBC 7.2  HGB 8.1*  HCT 26.2*  PLT 124*  CREATININE 2.40*   Estimated Creatinine Clearance: 15.7 mL/min (by C-G formula based on SCr of 2.4 mg/dL).   Microbiology: No results found for this or any previous visit (from the past 720 hour(s)).  Medical History: Past Medical History:  Diagnosis Date  . B12 deficiency 06/06/2016  . BPH (benign prostatic hyperplasia)   . Congenital malformation of spine    T12  . Cough for last 2 years   occasional white sputum  . Diverticulosis   . History of kidney stones 2013  . HOH (hard of hearing)   . Hx of hydronephrosis   . Hydronephrosis 10/27/2014  . NHL (non-Hodgkin's lymphoma) ([place])    nhl dx 5/09;  . Non-Hodgkin lymphoma ([place])   . Prostate enlargement   . Sleep apnea     Medications:  Prescriptions Prior to Admission  Medication Sig Dispense Refill Last Dose  . albuterol (PROVENTIL) (2.5 MG/3ML) 0.083% nebulizer solution Take 3 mLs (2.5 mg total) by nebulization every 6 (six) hours as needed for wheezing or shortness of breath. 75 mL 12 07/27/2016 at Unknown time  . finasteride (PROSCAR) 5 MG tablet Take 5 mg by mouth daily with breakfast.    07/27/2016 at Unknown time  . fish oil-omega-3 fatty acids 1000 MG capsule Take 1 g by mouth daily with breakfast.    07/27/2016 at Unknown time  . glucosamine-chondroitin 500-400 MG tablet Take 1 tablet by mouth every  morning.   07/27/2016 at Unknown time  . Hypromellose (GENTEAL OP) Place 1 drop into both eyes 2 (two) times daily. Dry eyes   07/27/2016 at Unknown time  . ibrutinib (IMBRUVICA) 140 MG capsul Take 420 mg by mouth every morning.   07/27/2016 at Unknown time  . Multiple Vitamins-Minerals (ICAPS AREDS 2) CAPS Take 1 capsule by mouth 2 (two) times daily.   07/27/2016 at Unknown time  . famotidine (PEPCID) 10 MG tablet Take 1 tablet (10 mg total) by mouth daily. (Patient not taking: Reported on 07/28/2016) 30 tablet 0 Not Taking at Unknown time    Assessment: Ibrutinib (Imbruvica) hold criteria: ANC < 750  Platelet count < 50,000 SCr > 1.5x baseline (or > 2 if baseline unknown) Bili > 1.5x ULN AST or ALT > 3 ULN NYHA Class 3 or 4 Heart failure Hemorrhage 3-7 days pre- or post-surgery Active infection  Patient with active infection and increased SCr, therefore will hold medication  Goal of Therapy:  Safe and effective use of Ibrutinib  Plan:  Patient with active infection and increased SCr, therefore will hold medication  Hold medication for now Oncology consult would be required to resume medication.  Tyler Deis, Shea Stakes [place] 07/28/2016,6:18 AM

## 2016-07-28 NOTE — Care Management Obs Status (Signed)
[facility] NOTIFICATION   Patient Details  Name: XYON DAFFERN MRN: [id number] Date of Birth: 1919/01/30   Medicare Observation Status Notification Given:  Other (see comment) (attempted to deliver OBS notice, patient is asleep)    Apolonio Schneiders, RN 07/28/2016, 3:01 PM

## 2016-07-28 NOTE — Progress Notes (Signed)
Pharmacy Antibiotic Note  Daniel [name] is a 82 y.o. male admitted on 07/27/2016 with UTI.  Pharmacy has been consulted for Cefepime dosing.  Plan: Cefepime 1gm iv q24hr  Weight: 130 lb 4.7 oz (59.1 kg)  Temp (24hrs), Avg:98 F (36.7 C), Min:97.6 F (36.4 C), Max:98.4 F (36.9 C)   Recent Labs Lab 07/28/16 0115  WBC 7.2  CREATININE 2.40*    Estimated Creatinine Clearance: 15.7 mL/min (by C-G formula based on SCr of 2.4 mg/dL).    No Known Allergies  Antimicrobials this admission: Cefepime 8/20 >> Ceftriaxone 8/20 x1  Dose adjustments this admission: -  Microbiology results: Pending  Thank you for allowing pharmacy to be a part of this patient's care.  Nani Skillern Crowford 07/28/2016 4:20 AM

## 2016-07-28 NOTE — Progress Notes (Signed)
Alerted MD to critical lab of lactic acid 2.5

## 2016-07-28 NOTE — Progress Notes (Signed)
Patient seen and evaluated earlier this a.m. by my associate. Please refer to H&P for details regarding assessment and plan. Lactic acidosis has resolved as well as hypotension.  We'll plan on reassessing next a.m.  Gen.: Patient in no acute distress, alert and awake Cardiovascular: No cyanosis Pulmonary: No increased work of breathing, no wheezes  [place], [employer]

## 2016-07-28 NOTE — Progress Notes (Signed)
PT Cancellation Note  Patient Details Name: Daniel [name] MRN: [id number] DOB: May 20, 1919   Cancelled Treatment:     PT order received but eval deferred at pt request 2* fatigue.  Pt states he has been up all night and is finally getting some rest.  Pt very pleasant and agreeable to follow up tomorrow am.  Will follow.   Markesha Hannig 07/28/2016, 3:43 PM

## 2016-07-28 NOTE — H&P (Signed)
History and Physical    Daniel [name] [id number] DOB: May 03, 1919 DOA: 07/27/2016  PCP: Daniel Gravel, MD   Patient coming from: Home   Chief Complaint: Fall, skin-tears   HPI: Daniel [name] is a 82 y.o. male with medical history significant for non-Hodgkin's lymphoma, BPH, bronchiectasis, and chronic hydronephrosis with Foley catheter who presents from home for evaluation of skin tears suffered when he fell. Patient was admitted to the hospital last month with urinary tract infection and acute kidney injury was discharged to a nursing facility in much improved and stable condition, and was then discharged home with home health. Patient had refused to stay in SNF and, unfortunately he also refused home health and has been alone with his elderly son checking in on him. He ambulates with a walker and reports that he was in his usual state of health today when he tripped over his sock and fell to the ground. He denies any loss of consciousness, but did rub his scalp and cause a skin tear. There is another tear in his skin at the right shoulder. He denied any significant pain associated with this. He denies any recent fevers or chills, lightheadedness, change in vision or hearing, or focal numbness or weakness. She denies any chest pain or palpitations. He endorses a chronic cough that is unchanged and denies any significant dyspnea. He has been managed with a Foley catheter that is typically changed about once a month and his urologist office. Patient notes that the urologist himself usually changes the Foley out and when it was last changed at outside hospital, they had to use a guidewire for placement.  ED Course: Upon arrival to the ED, patient is found to be afebrile, saturating well on room air, mildly tachycardic, and with blood pressure 90/47. Chemistry panel is notable for a serum creatinine of 2.40, up from 1.2 last month. CBC features a hemoglobin of 8.1, down from 9.1 one month ago, and a  thrombocytopenia 224,000. There was purulent urine collected from the Foley and sent for analysis which is consistent with infection. Sample was sent for culture. Patient was given a 1 L normal saline bolus and empiric Rocephin in the ED. Blood pressure has stabilized in the [id number] range systolic following the fluid bolus and tachycardia has resolved. Patient will be observed on the telemetry unit for ongoing evaluation and management for catheter associated UTI and acute kidney injury, as well as normocytic anemia with concerning downward trend in hemoglobin.  Review of Systems:  All other systems reviewed and apart from HPI, are negative.  Past Medical History:  Diagnosis Date  . B12 deficiency 06/06/2016  . BPH (benign prostatic hyperplasia)   . Congenital malformation of spine    T12  . Cough for last 2 years   occasional white sputum  . Diverticulosis   . History of kidney stones 2013  . HOH (hard of hearing)   . Hx of hydronephrosis   . Hydronephrosis 10/27/2014  . NHL (non-Hodgkin's lymphoma) ([place])    nhl dx 5/09;  . Non-Hodgkin lymphoma ([place])   . Prostate enlargement   . Sleep apnea     Past Surgical History:  Procedure Laterality Date  . BOTOX INJECTION N/A 07/15/2013   Procedure: BOTOX INJECTION;  Surgeon: Milus Banister, MD;  Location: [facility];  Service: Endoscopy;  Laterality: N/A;  . CHOLECYSTECTOMY  yrs ago  . ESOPHAGOGASTRODUODENOSCOPY (EGD) WITH PROPOFOL N/A 07/15/2013   Procedure: ESOPHAGOGASTRODUODENOSCOPY (EGD) WITH PROPOFOL;  Surgeon: Milus Banister,  MD;  Location: [facility];  Service: Endoscopy;  Laterality: N/A;  botox injection  . EYE SURGERY  yrs ago   Implants bilateral  . INGUINAL HERNIA REPAIR  yrs ago   left  . PROSTATE SURGERY  yrs ago     Social HX:  Born in [place], living in [place] since [place]. Worked as a Haematologist.     reports that he quit smoking about 56 years ago. His smoking use included Pipe. He has a 0.75 pack-year  smoking history. He has never used smokeless tobacco. He reports that he drinks about 0.6 oz of alcohol per week . He reports that he does not use drugs.  No Known Allergies  Family History  Problem Relation Age of Onset  . Tuberculosis Mother   . Blindness Father      Prior to Admission medications   Medication Sig Start Date End Date Taking? Authorizing Provider  albuterol (PROVENTIL) (2.5 MG/3ML) 0.083% nebulizer solution Take 3 mLs (2.5 mg total) by nebulization every 6 (six) hours as needed for wheezing or shortness of breath. 03/19/16  Yes Daniel Gravel, MD  finasteride (PROSCAR) 5 MG tablet Take 5 mg by mouth daily with breakfast.    Yes Historical Provider, MD  fish oil-omega-3 fatty acids 1000 MG capsule Take 1 g by mouth daily with breakfast.    Yes Historical Provider, MD  glucosamine-chondroitin 500-400 MG tablet Take 1 tablet by mouth every morning.   Yes Historical Provider, MD  Hypromellose (GENTEAL OP) Place 1 drop into both eyes 2 (two) times daily. Dry eyes   Yes Historical Provider, MD  ibrutinib (IMBRUVICA) 140 MG capsul Take 420 mg by mouth every morning.   Yes Historical Provider, MD  Multiple Vitamins-Minerals (ICAPS AREDS 2) CAPS Take 1 capsule by mouth 2 (two) times daily.   Yes Historical Provider, MD  famotidine (PEPCID) 10 MG tablet Take 1 tablet (10 mg total) by mouth daily. Patient not taking: Reported on 07/28/2016 06/17/16   Daniel Gravel, MD    Physical Exam: Vitals:   07/27/16 2345 07/28/16 0130 07/28/16 0200 07/28/16 0322  BP: (!) 90/47 (!) 99/48 (!) 108/52 99/64  Pulse: 105 78 81 79  Resp: 18 23 24 18   Temp: 98.4 F (36.9 C)   98.1 F (36.7 C)  TempSrc: Oral   Oral  SpO2: 99% 97% 99% 96%      Constitutional: NAD, calm, comfortable, laceration at right frontal scalp  Eyes: PERTLA, lids and conjunctivae normal ENMT: Mucous membranes are moist. Posterior pharynx clear of any exudate or lesions.   Neck: normal, supple, no masses, no  thyromegaly Respiratory: Scattered wheezes. Normal respiratory effort. No accessory muscle use.  Cardiovascular: S1 & S2 heard, regular rate and rhythm. BLE edema to thighs. No significant JVD. Abdomen: No distension, no tenderness, no masses palpated. Bowel sounds normal.  Musculoskeletal: no clubbing / cyanosis. No joint deformity upper and lower extremities.    Skin: Hyperpigmented patches and ecchymoses overly b/l UEs. Warm, dry, well-perfused. Neurologic: CN 2-12 grossly intact. Sensation intact, DTR normal. Strength 5/5 in all 4 limbs.  Psychiatric: Normal judgment and insight. Alert and oriented x 3. Normal mood and affect.     Labs on Admission: I have personally reviewed following labs and imaging studies  CBC:  Recent Labs Lab 07/28/16 0115  WBC 7.2  NEUTROABS 5.2  HGB 8.1*  HCT 26.2*  MCV 82.6  PLT [id number]*   Basic Metabolic Panel:  Recent Labs Lab 07/28/16 0115  NA 135  K 4.5  CL 105  CO2 24  GLUCOSE 116*  BUN 43*  CREATININE 2.40*  CALCIUM 8.0*   GFR: CrCl cannot be calculated (Unknown ideal weight.). Liver Function Tests: No results for input(s): AST, ALT, ALKPHOS, BILITOT, PROT, ALBUMIN in the last 168 hours. No results for input(s): LIPASE, AMYLASE in the last 168 hours. No results for input(s): AMMONIA in the last 168 hours. Coagulation Profile: No results for input(s): INR, PROTIME in the last 168 hours. Cardiac Enzymes: No results for input(s): CKTOTAL, CKMB, CKMBINDEX, TROPONINI in the last 168 hours. BNP (last 3 results) No results for input(s): PROBNP in the last 8760 hours. HbA1C: No results for input(s): HGBA1C in the last 72 hours. CBG: No results for input(s): GLUCAP in the last 168 hours. Lipid Profile: No results for input(s): CHOL, HDL, LDLCALC, TRIG, CHOLHDL, LDLDIRECT in the last 72 hours. Thyroid Function Tests: No results for input(s): TSH, T4TOTAL, FREET4, T3FREE, THYROIDAB in the last 72 hours. Anemia Panel: No results for  input(s): VITAMINB12, FOLATE, FERRITIN, TIBC, IRON, RETICCTPCT in the last 72 hours. Urine analysis:    Component Value Date/Time   COLORURINE YELLOW 07/28/2016 0223   APPEARANCEUR TURBID (A) 07/28/2016 0223   LABSPEC 1.014 07/28/2016 0223   PHURINE 6.0 07/28/2016 0223   GLUCOSEU NEGATIVE 07/28/2016 0223   HGBUR LARGE (A) 07/28/2016 0223   BILIRUBINUR NEGATIVE 07/28/2016 0223   KETONESUR NEGATIVE 07/28/2016 0223   PROTEINUR 100 (A) 07/28/2016 0223   NITRITE NEGATIVE 07/28/2016 0223   LEUKOCYTESUR LARGE (A) 07/28/2016 0223   Sepsis Labs: @LABRCNTIP (procalcitonin:4,lacticidven:4) )No results found for this or any previous visit (from the past 240 hour(s)).   Radiological Exams on Admission: No results found.  EKG: Not performed, will obtain as appropriate.   Assessment/Plan  1. AKI superimposed on CKD stage III  - SCr 2.40 on admission, up from 1.2 last month  - Likely a prerenal azotemia in setting of soft BP's and tachycardia  - 1 liter of NS given in ED, will continue NS at 75 cc/hr for now  - Avoid nephrotoxins, repeat chem panel tomorrow    2. UTI, foley-catheter associated  - Has chronic Foley catheter with purulent specimen obtained, sent for culture  - No fevers or leukocytosis, lactate still pending  - Given a dose of empiric Rocephin in ED - Hx of pseudomonal uti, change abx to cefepime - Follow culture and tailor as indicated  - Foley will need to be changed; night RN's want to wait until day given hx of difficult placement which has reportedly required urology at times   3. Non-Hodgkin lymphoma - Appears to be stable  - Continue ibrutinib    4. Normocytic anemia  - Hgb 8.1 on admission, down from 9.1 one month prior; had been wnl in March 2017  - No s/s of active blood-loss, possibly secondary to tyrosine kinase inhibitor  - Check FOBT, type and screen, repeat CBC in am  - Start empiric Pepcid for now    5. Chronic diastolic CHF  - Appears intravascularly  depleted on admission with soft BP, mild tachycardia, AKI  - There is no significant neck vein distension, but BLE edema into thighs reported by pt to be chronic  - TTE (03/06/10) with EF 0000000, grade 1 diastolic dysfunction, and mild LAE  - Follow daily wts and strict I/O's   6. Thrombocytopenia  - Platelet count 124,000 on admission, improved from recent priors   - Likely secondary to ibrutinib   -  No s/s of active blood-loss, evaluating anemia as above    DVT prophylaxis: SCD's  Code Status: DNR Family Communication: Discussed with patient Disposition Plan: Observe on telemetry  Consults called: None Admission status: Observation     Vianne Bulls, MD [facility] Pager [phone]  If 7PM-7AM, please contact night-coverage [email or web address] Password [email or web address]  07/28/2016, 3:47 AM

## 2016-07-28 NOTE — ED Notes (Signed)
Daniel [name] patients son is going to the house but, to call him if anything is needed at [employer] [phone] or Home [phone]

## 2016-07-28 NOTE — Progress Notes (Signed)
Blood pressures remain low. Confirmed manually. MD on call notified. Patient states "I feel fine". Pt resting in bed.

## 2016-07-29 LAB — CBC
HEMATOCRIT: 26.9 % — AB (ref 39.0–52.0)
HEMOGLOBIN: 8.1 g/dL — AB (ref 13.0–17.0)
MCH: 25.8 pg — AB (ref 26.0–34.0)
MCHC: 30.1 g/dL (ref 30.0–36.0)
MCV: 85.7 fL (ref 78.0–100.0)
Platelets: 114 10*3/uL — ABNORMAL LOW (ref 150–400)
RBC: 3.14 MIL/uL — AB (ref 4.22–5.81)
RDW: 19.1 % — ABNORMAL HIGH (ref 11.5–15.5)
WBC: 3.7 10*3/uL — AB (ref 4.0–10.5)

## 2016-07-29 LAB — BASIC METABOLIC PANEL
ANION GAP: 4 — AB (ref 5–15)
BUN: 39 mg/dL — AB (ref 6–20)
CHLORIDE: 111 mmol/L (ref 101–111)
CO2: 22 mmol/L (ref 22–32)
Calcium: 7.5 mg/dL — ABNORMAL LOW (ref 8.9–10.3)
Creatinine, Ser: 1.62 mg/dL — ABNORMAL HIGH (ref 0.61–1.24)
GFR calc Af Amer: 40 mL/min — ABNORMAL LOW (ref >60)
GFR calc non Af Amer: 35 mL/min — ABNORMAL LOW (ref >60)
GLUCOSE: 128 mg/dL — AB (ref 65–99)
POTASSIUM: 4.2 mmol/L (ref 3.5–5.1)
Sodium: 137 mmol/L (ref 135–145)

## 2016-07-29 LAB — GLUCOSE, CAPILLARY: GLUCOSE-CAPILLARY: 92 mg/dL (ref 65–99)

## 2016-07-29 LAB — UREA NITROGEN, URINE: UREA NITROGEN UR: 403 mg/dL

## 2016-07-29 MED ORDER — SODIUM CHLORIDE 0.9 % IV BOLUS (SEPSIS)
500.0000 mL | Freq: Once | INTRAVENOUS | Status: AC
Start: 2016-07-29 — End: 2016-07-29
  Administered 2016-07-29: 500 mL via INTRAVENOUS

## 2016-07-29 MED ORDER — FAMOTIDINE 20 MG PO TABS
20.0000 mg | ORAL_TABLET | Freq: Every day | ORAL | Status: DC
  Administered 2016-07-30 – 2016-08-02 (×4): 20 mg via ORAL
  Filled 2016-07-29 (×4): qty 1

## 2016-07-29 NOTE — Progress Notes (Signed)
Key Points: Use following P&T approved IV to PO antibiotic change policy.  Description contains the criteria that are approved Note: Policy Excludes:  Esophagectomy patientsPHARMACIST - PHYSICIAN COMMUNICATION DR: Wendee Beavers CONCERNING: IV to Oral Route Change Policy  RECOMMENDATION: This patient is receiving famotidine by the intravenous route.  Based on criteria approved by the Pharmacy and Therapeutics Committee, the intravenous medication(s) is/are being converted to the equivalent oral dose form(s). Pt's Scr is elevated and requires a dose adjustment for CrCl<30 mL/min. Discussed with Dr. Wendee Beavers. Famotidine 20 mg PO q24h  DESCRIPTION: These criteria include:  The patient is eating (either orally or via tube) and/or has been taking other orally administered medications for a least 24 hours  The patient has no evidence of active gastrointestinal bleeding or impaired GI absorption (gastrectomy, short bowel, patient on TNA or NPO).  If you have questions about this conversion, please contact the Pharmacy Department  []   [phone] )  [facility] []   [phone] )  [facility] []   [phone] )  [facility] []   [phone] )  [facility] [x]   [phone] )  [place] 07/29/2016 8:27 AM

## 2016-07-29 NOTE — Progress Notes (Signed)
PROGRESS NOTE    Daniel [name]  [id number] DOB: June 01, 1919 DOA: 07/27/2016 PCP: Daniel Gravel, MD  Brief Narrative:   82 y.o. male with medical history significant for non-Hodgkin's lymphoma, BPH, bronchiectasis, and chronic hydronephrosis with Foley catheter who presents from home for evaluation of skin tears suffered when he fell. Patient was admitted to the hospital last month with urinary tract infection and acute kidney injury was discharged to a nursing facility in much improved and stable condition, and was then discharged home with home health. Patient had refused to stay in SNF and, unfortunately he also refused home health and has been alone with his elderly son checking in on him. He ambulates with a walker and reports that he was in his usual state of health today when he tripped over his sock and fell to the ground.  Patient on further evaluation was found to have soft blood pressures and acute kidney injury.   Assessment & Plan:   Principal Problem:   AKI (acute kidney injury) ([place]) on chronic CKD most likely stage 2-3 - most likely secondary prerenal etiology. Continue IV fluid rehydration  Hypotension - Resolved with fluid bolus. Continue IV fluids  Active Problems:   Lymphoma, small lymphocytic ([place]) - Patient to continue follow-up with oncologist once discharged   Anemia in neoplastic disease - Stable  Thrombocytopenia - most likely secondary to chemotherapeutic agents as outpatient. Currently stable    UTI (urinary tract infection) - We'll continue current IV antibiotics    Chronic diastolic CHF (congestive heart failure) ([facility]) - Compensated currently    Facial laceration - Stable   DVT prophylaxis: SCD's Code Status: DNR Family Communication: None at bedside Disposition Plan: pending improvement in condition   Consultants:   None   Procedures: (None   Antimicrobials:cefepime   Subjective: Pt has no new complaints. No acute issues  overnight.   Objective: Vitals:   07/29/16 0417 07/29/16 0619 07/29/16 1123 07/29/16 1526  BP: (!) 87/38  (!) 98/53 (!) 117/49  Pulse: 69  65 69  Resp: 20   20  Temp: 98.1 F (36.7 C)   97.5 F (36.4 C)  TempSrc: Axillary   Oral  SpO2: 94%   99%  Weight:  61.7 kg (136 lb 0.4 oz)    Height:        Intake/Output Summary (Last 24 hours) at 07/29/16 1654 Last data filed at 07/29/16 1531  Gross per 24 hour  Intake             2443 ml  Output             2200 ml  Net              243 ml   Filed Weights   07/28/16 0409 07/28/16 0626 07/29/16 0619  Weight: 59.1 kg (130 lb 4.7 oz) 59.8 kg (131 lb 13.4 oz) 61.7 kg (136 lb 0.4 oz)    Examination:  General exam: Appears calm and comfortable, in nad. Neck: no goiter, supple Respiratory system: Clear to auscultation. Respiratory effort normal. Cardiovascular system: S1 & S2 heard, RRR. No JVD, murmurs, rubs, gallops or clicks. No pedal edema. Gastrointestinal system: Abdomen is nondistended, soft and nontender. No organomegaly or masses felt. Normal bowel sounds heard. Central nervous system: Alert and oriented. No focal neurological deficits. Extremities: Symmetric tone Psychiatry: . Mood & affect appropriate.     Data Reviewed: I have personally reviewed following labs and imaging studies  CBC:  Recent Labs Lab 07/28/16 0115  07/28/16 0645 07/29/16 0939  WBC 7.2 5.4 3.7*  NEUTROABS 5.2 3.1  --   HGB 8.1* 7.6* 8.1*  HCT 26.2* 25.0* 26.9*  MCV 82.6 86.2 85.7  PLT 124* 124* [id number]*   Basic Metabolic Panel:  Recent Labs Lab 07/28/16 0115 07/28/16 0645 07/29/16 0939  NA 135 137 137  K 4.5 4.1 4.2  CL 105 108 111  CO2 24 23 22   GLUCOSE 116* 123* 128*  BUN 43* 38* 39*  CREATININE 2.40* 2.01* 1.62*  CALCIUM 8.0* 7.5* 7.5*   GFR: Estimated Creatinine Clearance: 24.3 mL/min (by C-G formula based on SCr of 1.62 mg/dL). Liver Function Tests: No results for input(s): AST, ALT, ALKPHOS, BILITOT, PROT, ALBUMIN in the  last 168 hours. No results for input(s): LIPASE, AMYLASE in the last 168 hours. No results for input(s): AMMONIA in the last 168 hours. Coagulation Profile:  Recent Labs Lab 07/28/16 0645  INR 1.24   Cardiac Enzymes: No results for input(s): CKTOTAL, CKMB, CKMBINDEX, TROPONINI in the last 168 hours. BNP (last 3 results) No results for input(s): PROBNP in the last 8760 hours. HbA1C: No results for input(s): HGBA1C in the last 72 hours. CBG:  Recent Labs Lab 07/28/16 0737 07/29/16 0730  GLUCAP 117* 92   Lipid Profile: No results for input(s): CHOL, HDL, LDLCALC, TRIG, CHOLHDL, LDLDIRECT in the last 72 hours. Thyroid Function Tests: No results for input(s): TSH, T4TOTAL, FREET4, T3FREE, THYROIDAB in the last 72 hours. Anemia Panel: No results for input(s): VITAMINB12, FOLATE, FERRITIN, TIBC, IRON, RETICCTPCT in the last 72 hours. Sepsis Labs:  Recent Labs Lab 07/28/16 0645 07/28/16 0930 07/28/16 1427  PROCALCITON 0.37  --   --   LATICACIDVEN  --  2.5* 1.9    Recent Results (from the past 240 hour(s))  Urine culture     Status: Abnormal (Preliminary result)   Collection Time: 07/28/16  2:23 AM  Result Value Ref Range Status   Specimen Description URINE, RANDOM  Final   Special Requests NONE  Final   Culture >=100,000 COLONIES/mL PSEUDOMONAS AERUGINOSA (A)  Final   Report Status PENDING  Incomplete         Radiology Studies: US Renal  Result Date: 07/28/2016 CLINICAL DATA:  Acute kidney injury EXAM: RENAL / URINARY TRACT ULTRASOUND COMPLETE COMPARISON:  06/14/2016 FINDINGS: Right Kidney: Length: 14 cm. Chronic marked hydronephrosis and hydroureter. Nuclear medicine renal function study performed 03/19/2016. No internal debris or calculus is noted. Echogenic cortex consistent with medical renal disease. Left Kidney: Length: 9 cm. Echogenic cortex that is diffusely thinned. No hydronephrosis. Bladder: Marked prostatomegaly. Foley catheter present with collapsed,  redundant appearing bladder. Bladder diverticulum is noted. There is gas within the urinary bladder lumen, nonspecific in the setting of catheterization. IMPRESSION: 1. No acute finding when compared to prior. 2. Chronic severe right hydroureteronephrosis. Reference renal function study 03/19/2016. 3. Medical renal disease and left renal atrophy. 4. Marked prostate enlargement. Foley catheter with collapsed bladder. Electronically Signed   By: Monte Fantasia M.D.   On: 07/28/2016 06:44        Scheduled Meds: . antiseptic oral rinse  7 mL Mouth Rinse q12n4p  . ceFEPime (MAXIPIME) IV  1 g Intravenous Q0600  . chlorhexidine  15 mL Mouth Rinse BID  . [START ON 07/30/2016] famotidine  20 mg Oral Daily  . finasteride  5 mg Oral Q breakfast  . multivitamin  1 tablet Oral BID  . omega-3 acid ethyl esters  1 g Oral Q breakfast  . polyvinyl  alcohol  1 drop Both Eyes BID  . sodium chloride flush  3 mL Intravenous Q12H   Continuous Infusions: . sodium chloride 75 mL/hr at 07/29/16 0315     LOS: 1 day   Time spent: > 35 minutes  Velvet Bathe, MD [facility] Pager [phone]  If 7PM-7AM, please contact night-coverage [email or web address] Password [facility] 07/29/2016, 4:54 PM

## 2016-07-29 NOTE — Evaluation (Signed)
Physical Therapy Evaluation Patient Details Name: Daniel [name] MRN: [id number] DOB: 12-12-1918 Today's Date: 07/29/2016   History of Present Illness   82 yo male with hx of lymphoma, Pancytopenia, apparently has had FTT, and protein calorie malnutrition admitted with fall, skin tears, UTI, AKI on CKD. Pt stated that prior to admission he fell while reaching for his walker which was farther away than he realized, he denies other falls in past couple years.   Clinical Impression  Pt admitted with above diagnosis. Pt currently with functional limitations due to the deficits listed below (see PT Problem List).  Pt lives alone and would benefit from more assistance at home or at [facility]. He stated he's applied to the [place] for more assistance and approval is pending.  Pt will benefit from skilled PT to increase their independence and safety with mobility to allow discharge to the venue listed below.       Follow Up Recommendations SNF;Supervision for mobility/OOB    Equipment Recommendations  Rolling Walker with 5" wheels (pt has one that doesn't function well)    Recommendations for Other Services       Precautions / Restrictions Precautions Precautions: Fall Precaution Comments: h/o recent falls Restrictions Weight Bearing Restrictions: No      Mobility  Bed Mobility Overal bed mobility: Needs Assistance Bed Mobility: Supine to Sit     Supine to sit: Min assist     General bed mobility comments: min A to scoot to EOB  Transfers Overall transfer level: Needs assistance Equipment used: Rolling walker (2 wheeled) Transfers: Sit to/from [facility] Sit to Stand: Mod assist Stand pivot transfers: Min assist       General transfer comment: Mod A to rise, min A to pivot  Ambulation/Gait Ambulation/Gait assistance: Supervision Ambulation Distance (Feet): 120 Feet Assistive device: Rolling walker (2 wheeled) Gait Pattern/deviations: Decreased step length -  right;Decreased step length - left   Gait velocity interpretation: at or above normal speed for age/gender General Gait Details: steady with RW, no LOB, 2/4 dyspnea with walking but SaO2 96% on RA  Stairs            Wheelchair Mobility    Modified Rankin (Stroke Patients Only)       Balance Overall balance assessment: Needs assistance;History of Falls   Sitting balance-Leahy Scale: Fair       Standing balance-Leahy Scale: Poor                               Pertinent Vitals/Pain Pain Assessment: No/denies pain    Home Living Family/patient expects to be discharged to:: Private residence Living Arrangements: Alone Available Help at Discharge: Neighbor;Family;Available PRN/intermittently   Home Access: Stairs to enter   Entrance Stairs-Number of Steps: 1 Home Layout: Able to live on main level with bedroom/bathroom Home Equipment: Walker - 2 wheels;Cane - single point;Wheelchair - [employer] - 4 wheels      Prior Function Level of Independence: Independent with assistive device(s)         Comments: has a cane and rolling walker, uses both; stated his RW doesn't roll straight     Hand Dominance        Extremity/Trunk Assessment   Upper Extremity Assessment: Overall WFL for tasks assessed           Lower Extremity Assessment: Overall WFL for tasks assessed (knee ext +4/5, sensation intact)      Cervical / Trunk  Assessment: Kyphotic  Communication   Communication: HOH  Cognition Arousal/Alertness: Awake/alert Behavior During Therapy: WFL for tasks assessed/performed Overall Cognitive Status: Within Functional Limits for tasks assessed                      General Comments      Exercises        Assessment/Plan    PT Assessment Patient needs continued PT services  PT Diagnosis Difficulty walking;Generalized weakness   PT Problem List Decreased mobility  PT Treatment Interventions Gait training;DME  instruction;Functional mobility training;Stair training;Therapeutic activities;Therapeutic exercise;Balance training;Patient/family education   PT Goals (Current goals can be found in the Care Plan section) Acute Rehab PT Goals Patient Stated Goal: wants more help at home or at SNF PT Goal Formulation: With patient Time For Goal Achievement: 08/12/16 Potential to Achieve Goals: Fair    Frequency Min 3X/week   Barriers to discharge Decreased caregiver support pt stated he sometimes doens't see people for 5 days at a time, he stated he would like more help at home or at SNF and is waiting for approval from [place] During Treatment: Gait belt Activity Tolerance: Patient tolerated treatment well Patient left: in chair;with call bell/phone within reach;with chair alarm set Nurse Communication: Mobility status         Time: [id number] PT Time Calculation (min) (ACUTE ONLY): 33 min   Charges:   PT Evaluation $PT Eval Low Complexity: 1 Procedure PT Treatments $Gait Training: 8-22 mins   PT G Codes:        Philomena Doheny 07/29/2016, 12:22 PM [phone]

## 2016-07-30 LAB — BASIC METABOLIC PANEL
ANION GAP: 3 — AB (ref 5–15)
BUN: 41 mg/dL — AB (ref 6–20)
CO2: 22 mmol/L (ref 22–32)
Calcium: 7.6 mg/dL — ABNORMAL LOW (ref 8.9–10.3)
Chloride: 114 mmol/L — ABNORMAL HIGH (ref 101–111)
Creatinine, Ser: 1.48 mg/dL — ABNORMAL HIGH (ref 0.61–1.24)
GFR, EST AFRICAN AMERICAN: 45 mL/min — AB (ref >60)
GFR, EST NON AFRICAN AMERICAN: 39 mL/min — AB (ref >60)
Glucose, Bld: 90 mg/dL (ref 65–99)
POTASSIUM: 4.5 mmol/L (ref 3.5–5.1)
SODIUM: 139 mmol/L (ref 135–145)

## 2016-07-30 LAB — GLUCOSE, CAPILLARY: GLUCOSE-CAPILLARY: 98 mg/dL (ref 65–99)

## 2016-07-30 LAB — URINE CULTURE: Culture: >=100000 — AB

## 2016-07-30 NOTE — Progress Notes (Signed)
PROGRESS NOTE    Daniel [name]  [id number] DOB: 07-13-1919 DOA: 07/27/2016 PCP: [name], MD  Brief Narrative:   82 y.o. male with medical history significant for non-Hodgkin's lymphoma, BPH, bronchiectasis, and chronic hydronephrosis with Foley catheter who presents from home for evaluation of skin tears suffered when he fell. Patient was admitted to the hospital last month with urinary tract infection and acute kidney injury was discharged to a nursing facility in much improved and stable condition, and was then discharged home with home health. Patient had refused to stay in SNF and, unfortunately he also refused home health and has been alone with his elderly son checking in on him. He ambulates with a walker and reports that he was in his usual state of health today when he tripped over his sock and fell to the ground.  Patient on further evaluation was found to have soft blood pressures and acute kidney injury.   Assessment & Plan:   Principal Problem:   AKI (acute kidney injury) ([place]) on chronic CKD most likely stage 2-3 - most likely secondary prerenal etiology. Continue IV fluid rehydration  Hypotension - Continues to be an intermittent problem.  Active Problems:   Lymphoma, small lymphocytic ([place]) - Patient to continue follow-up with oncologist once discharged   Anemia in neoplastic disease - Stable  Thrombocytopenia - most likely secondary to chemotherapeutic agents as outpatient. Currently stable    UTI (urinary tract infection) - We'll continue current IV antibiotics - Pt may need foley replacement. Reportedly has history of of being difficult to place.    Chronic diastolic CHF (congestive heart failure) (HCC) - Compensated currently    Facial laceration - Stable   DVT prophylaxis: SCD's Code Status: DNR Family Communication: None at bedside Disposition Plan: pending improvement in condition   Consultants:   None   Procedures:  (None   Antimicrobials:cefepime   Subjective: Pt has no new complaints. No acute issues overnight.   Objective: Vitals:   07/30/16 0549 07/30/16 0600 07/30/16 1000 07/30/16 1500  BP: (!) 86/41  (!) 103/45 (!) 115/56  Pulse: 79  77 76  Resp: 19   18  Temp: 98.4 F (36.9 C)  97.5 F (36.4 C) 97.6 F (36.4 C)  TempSrc: Oral  Oral Oral  SpO2: 95%  100% 98%  Weight:  65.9 kg (145 lb 4.5 oz)    Height:        Intake/Output Summary (Last 24 hours) at 07/30/16 1600 Last data filed at 07/30/16 0835  Gross per 24 hour  Intake           1002.5 ml  Output             1500 ml  Net           -497.5 ml   Filed Weights   07/28/16 0626 07/29/16 0619 07/30/16 0600  Weight: 59.8 kg (131 lb 13.4 oz) 61.7 kg (136 lb 0.4 oz) 65.9 kg (145 lb 4.5 oz)    Examination:  General exam: Appears calm and comfortable, in nad. Neck: no goiter, supple Respiratory system: Clear to auscultation. Respiratory effort normal. Cardiovascular system: S1 & S2 heard, RRR. No JVD, murmurs, rubs, gallops or clicks. No pedal edema. Gastrointestinal system: Abdomen is nondistended, soft and nontender. No organomegaly or masses felt. Normal bowel sounds heard. Central nervous system: Alert and oriented. No focal neurological deficits. Extremities: Symmetric tone Psychiatry: . Mood & affect appropriate.     Data Reviewed: I have personally reviewed following  labs and imaging studies  CBC:  Recent Labs Lab 07/28/16 0115 07/28/16 0645 07/29/16 0939  WBC 7.2 5.4 3.7*  NEUTROABS 5.2 3.1  --   HGB 8.1* 7.6* 8.1*  HCT 26.2* 25.0* 26.9*  MCV 82.6 86.2 85.7  PLT 124* 124* [id number]*   Basic Metabolic Panel:  Recent Labs Lab 07/28/16 0115 07/28/16 0645 07/29/16 0939 07/30/16 0559  NA 135 137 137 139  K 4.5 4.1 4.2 4.5  CL 105 108 111 114*  CO2 24 23 22 22   GLUCOSE 116* 123* 128* 90  BUN 43* 38* 39* 41*  CREATININE 2.40* 2.01* 1.62* 1.48*  CALCIUM 8.0* 7.5* 7.5* 7.6*   GFR: Estimated Creatinine  Clearance: 26.5 mL/min (by C-G formula based on SCr of 1.48 mg/dL). Liver Function Tests: No results for input(s): AST, ALT, ALKPHOS, BILITOT, PROT, ALBUMIN in the last 168 hours. No results for input(s): LIPASE, AMYLASE in the last 168 hours. No results for input(s): AMMONIA in the last 168 hours. Coagulation Profile:  Recent Labs Lab 07/28/16 0645  INR 1.24   Cardiac Enzymes: No results for input(s): CKTOTAL, CKMB, CKMBINDEX, TROPONINI in the last 168 hours. BNP (last 3 results) No results for input(s): PROBNP in the last 8760 hours. HbA1C: No results for input(s): HGBA1C in the last 72 hours. CBG:  Recent Labs Lab 07/28/16 0737 07/29/16 0730 07/30/16 0804  GLUCAP 117* 92 98   Lipid Profile: No results for input(s): CHOL, HDL, LDLCALC, TRIG, CHOLHDL, LDLDIRECT in the last 72 hours. Thyroid Function Tests: No results for input(s): TSH, T4TOTAL, FREET4, T3FREE, THYROIDAB in the last 72 hours. Anemia Panel: No results for input(s): VITAMINB12, FOLATE, FERRITIN, TIBC, IRON, RETICCTPCT in the last 72 hours. Sepsis Labs:  Recent Labs Lab 07/28/16 0645 07/28/16 0930 07/28/16 1427  PROCALCITON 0.37  --   --   LATICACIDVEN  --  2.5* 1.9    Recent Results (from the past 240 hour(s))  Urine culture     Status: Abnormal   Collection Time: 07/28/16  2:23 AM  Result Value Ref Range Status   Specimen Description URINE, RANDOM  Final   Special Requests NONE  Final   Culture >=100,000 COLONIES/mL PSEUDOMONAS AERUGINOSA (A)  Final   Report Status 07/30/2016 FINAL  Final   Organism ID, Bacteria PSEUDOMONAS AERUGINOSA (A)  Final      Susceptibility   Pseudomonas aeruginosa - MIC*    CEFTAZIDIME 4 SENSITIVE Sensitive     CIPROFLOXACIN <=0.25 SENSITIVE Sensitive     GENTAMICIN 4 SENSITIVE Sensitive     IMIPENEM 1 SENSITIVE Sensitive     PIP/TAZO <=4 SENSITIVE Sensitive     CEFEPIME 4 SENSITIVE Sensitive     * >=100,000 COLONIES/mL PSEUDOMONAS AERUGINOSA          Radiology Studies: No results found.      Scheduled Meds: . antiseptic oral rinse  7 mL Mouth Rinse q12n4p  . ceFEPime (MAXIPIME) IV  1 g Intravenous Q0600  . chlorhexidine  15 mL Mouth Rinse BID  . famotidine  20 mg Oral Daily  . finasteride  5 mg Oral Q breakfast  . multivitamin  1 tablet Oral BID  . omega-3 acid ethyl esters  1 g Oral Q breakfast  . polyvinyl alcohol  1 drop Both Eyes BID  . sodium chloride flush  3 mL Intravenous Q12H   Continuous Infusions: . sodium chloride 75 mL/hr at 07/30/16 0315     LOS: 2 days   Time spent: > 35 minutes  Fielding Mault, [place],  MD [facility] Pager [phone]  If 7PM-7AM, please contact night-coverage [email or web address] Password [email or web address] 07/30/2016, 4:00 PM

## 2016-07-31 DIAGNOSIS — I959 Hypotension, unspecified: Secondary | ICD-10-CM

## 2016-07-31 LAB — GLUCOSE, CAPILLARY: GLUCOSE-CAPILLARY: 81 mg/dL (ref 65–99)

## 2016-07-31 LAB — BASIC METABOLIC PANEL
ANION GAP: 2 — AB (ref 5–15)
BUN: 37 mg/dL — ABNORMAL HIGH (ref 6–20)
CALCIUM: 7.5 mg/dL — AB (ref 8.9–10.3)
CO2: 22 mmol/L (ref 22–32)
CREATININE: 1.39 mg/dL — AB (ref 0.61–1.24)
Chloride: 112 mmol/L — ABNORMAL HIGH (ref 101–111)
GFR, EST AFRICAN AMERICAN: 48 mL/min — AB (ref >60)
GFR, EST NON AFRICAN AMERICAN: 42 mL/min — AB (ref >60)
Glucose, Bld: 111 mg/dL — ABNORMAL HIGH (ref 65–99)
Potassium: 4.5 mmol/L (ref 3.5–5.1)
SODIUM: 136 mmol/L (ref 135–145)

## 2016-07-31 NOTE — Progress Notes (Addendum)
Patient ID: Daniel [name], male   DOB: [birth year], 82 y.o.   MRN: [id number]                                                                PROGRESS NOTE                                                                                                                                                                                                             Patient Demographics:    Daniel [name], is a 82 y.o. male, DOB - [birth year], [id number]  Admit date - 07/27/2016   Admitting Physician Vianne Bulls, MD  Outpatient Primary MD for the patient is [name], MD  LOS - 3  Outpatient Specialists:    Chief Complaint  Patient presents with  . Fall       Brief Narrative  82 y.o.malewith medical history significant for non-Hodgkin's lymphoma, BPH, bronchiectasis, and chronic hydronephrosis with Foley catheter who presents from home for evaluation of skin tears suffered when he fell. Patient was admitted to the hospital last month with urinary tract infection and acute kidney injury was discharged to a nursing facility in much improved and stable condition, and was then discharged home with home health. Patient had refused to stay in SNF and, unfortunately he also refused home health and has been alone with his elderly son checking in on him. He ambulates with a walker and reports that he was in his usual state of health today when he tripped over his sock and fell to the ground.  Patient on further evaluation was found to have soft blood pressures and acute kidney injury.   Subjective:    Daniel [name] today has been feeling stronger.  denies dysuria, fever.  No headache, No chest pain, No abdominal pain - No Nausea, No new weakness tingling or numbness, No Cough - SOB.   Assessment  & Plan :    Principal Problem:   AKI (acute kidney injury) ([place]) Active Problems:   Lymphoma, small lymphocytic ([place])   Anemia in neoplastic disease   Chronic kidney disease (CKD), stage III  (moderate)   Thrombocytopenia (HCC)   Essential hypertension   UTI (urinary tract infection)   Hypotension   Chronic diastolic CHF (congestive heart failure) (HCC)   Facial laceration  UTI (lower urinary tract infection)   1.  Uti Pseudomonas, cont cefepime  2. Hypotension Cont ns iv Check echo  3. LYmphoma, small lymphocytic Cont f/u with oncology once discharged.   4. Anemia Stable  5. Thrombocytopneia Stable  6. CHF (diastolic) Stable  7. Facial laceration Stable  8. ARF Improving Check cbc, cmp in am   Code Status :  DNR  Family Communication  :  Disposition Plan  : ? SNF  Barriers For Discharge :   Consults  :    Procedures  :   DVT Prophylaxis  :   SCDs   Lab Results  Component Value Date   PLT 114 (L) 07/29/2016    Antibiotics  :    Anti-infectives    Start     Dose/Rate Route Frequency Ordered Stop   07/28/16 0500  ceFEPIme (MAXIPIME) 1 g in dextrose 5 % 50 mL IVPB     1 g 100 mL/hr over 30 Minutes Intravenous Daily 07/28/16 0343     07/28/16 0230  cefTRIAXone (ROCEPHIN) 1 g in dextrose 5 % 50 mL IVPB     1 g 100 mL/hr over 30 Minutes Intravenous  Once 07/28/16 0226 07/28/16 0322        Objective:   Vitals:   07/30/16 1000 07/30/16 1500 07/30/16 2127 07/31/16 0454  BP: (!) 103/45 (!) 115/56 (!) 104/48 (!) 85/40  Pulse: 77 76 83 80  Resp:  18  20  Temp: 97.5 F (36.4 C) 97.6 F (36.4 C) 97.6 F (36.4 C) 98.5 F (36.9 C)  TempSrc: Oral Oral Oral Oral  SpO2: 100% 98% 98% 96%  Weight:      Height:        Wt Readings from Last 3 Encounters:  07/30/16 65.9 kg (145 lb 4.5 oz)  07/10/16 55.8 kg (123 lb)  06/18/16 55.8 kg (123 lb)     Intake/Output Summary (Last 24 hours) at 07/31/16 0744 Last data filed at 07/31/16 0454  Gross per 24 hour  Intake           931.25 ml  Output             1750 ml  Net          -818.75 ml     Physical Exam  Awake Alert, Oriented X 3, No new F.N deficits, Normal  affect [place].AT,PERRAL Supple Neck,No JVD, No cervical lymphadenopathy appriciated.  Symmetrical Chest wall movement, Good air movement bilaterally, CTAB RRR,No Gallops,Rubs or new Murmurs, No Parasternal Heave +ve B.Sounds, Abd Soft, No tenderness, No organomegaly appriciated, No rebound - guarding or rigidity. No Cyanosis, Clubbing or edema, + bruise along right side of head   Data Review:    CBC  Recent Labs Lab 07/28/16 0115 07/28/16 0645 07/29/16 0939  WBC 7.2 5.4 3.7*  HGB 8.1* 7.6* 8.1*  HCT 26.2* 25.0* 26.9*  PLT 124* 124* 114*  MCV 82.6 86.2 85.7  MCH 25.6* 26.2 25.8*  MCHC 30.9 30.4 30.1  RDW 18.8* 19.3* 19.1*  LYMPHSABS 1.2 1.5  --   MONOABS 0.8 0.7  --   EOSABS 0.0 0.0  --   BASOSABS 0.0 0.0  --     Chemistries   Recent Labs Lab 07/28/16 0115 07/28/16 0645 07/29/16 0939 07/30/16 0559 07/31/16 0005  NA 135 137 137 139 136  K 4.5 4.1 4.2 4.5 4.5  CL 105 108 111 114* 112*  CO2 24 23 22 22 22   GLUCOSE 116* 123* 128* 90 111*  BUN 43* 38* 39* 41* 37*  CREATININE 2.40* 2.01* 1.62* 1.48* 1.39*  CALCIUM 8.0* 7.5* 7.5* 7.6* 7.5*   ------------------------------------------------------------------------------------------------------------------ No results for input(s): CHOL, HDL, LDLCALC, TRIG, CHOLHDL, LDLDIRECT in the last 72 hours.  No results found for: HGBA1C ------------------------------------------------------------------------------------------------------------------ No results for input(s): TSH, T4TOTAL, T3FREE, THYROIDAB in the last 72 hours.  Invalid input(s): FREET3 ------------------------------------------------------------------------------------------------------------------ No results for input(s): VITAMINB12, FOLATE, FERRITIN, TIBC, IRON, RETICCTPCT in the last 72 hours.  Coagulation profile  Recent Labs Lab 07/28/16 0645  INR 1.24    No results for input(s): DDIMER in the last 72 hours.  Cardiac Enzymes No results for  input(s): CKMB, TROPONINI, MYOGLOBIN in the last 168 hours.  Invalid input(s): CK ------------------------------------------------------------------------------------------------------------------ No results found for: BNP  Inpatient Medications  Scheduled Meds: . antiseptic oral rinse  7 mL Mouth Rinse q12n4p  . ceFEPime (MAXIPIME) IV  1 g Intravenous Q0600  . chlorhexidine  15 mL Mouth Rinse BID  . famotidine  20 mg Oral Daily  . finasteride  5 mg Oral Q breakfast  . multivitamin  1 tablet Oral BID  . omega-3 acid ethyl esters  1 g Oral Q breakfast  . polyvinyl alcohol  1 drop Both Eyes BID  . sodium chloride flush  3 mL Intravenous Q12H   Continuous Infusions: . sodium chloride 75 mL/hr at 07/31/16 0731   PRN Meds:.acetaminophen **OR** acetaminophen, albuterol, bisacodyl, HYDROcodone-acetaminophen, ondansetron **OR** ondansetron (ZOFRAN) IV, polyethylene glycol  Micro Results Recent Results (from the past 240 hour(s))  Urine culture     Status: Abnormal   Collection Time: 07/28/16  2:23 AM  Result Value Ref Range Status   Specimen Description URINE, RANDOM  Final   Special Requests NONE  Final   Culture >=100,000 COLONIES/mL PSEUDOMONAS AERUGINOSA (A)  Final   Report Status 07/30/2016 FINAL  Final   Organism ID, Bacteria PSEUDOMONAS AERUGINOSA (A)  Final      Susceptibility   Pseudomonas aeruginosa - MIC*    CEFTAZIDIME 4 SENSITIVE Sensitive     CIPROFLOXACIN <=0.25 SENSITIVE Sensitive     GENTAMICIN 4 SENSITIVE Sensitive     IMIPENEM 1 SENSITIVE Sensitive     PIP/TAZO <=4 SENSITIVE Sensitive     CEFEPIME 4 SENSITIVE Sensitive     * >=100,000 COLONIES/mL PSEUDOMONAS AERUGINOSA    Radiology Reports US Renal  Result Date: 07/28/2016 CLINICAL DATA:  Acute kidney injury EXAM: RENAL / URINARY TRACT ULTRASOUND COMPLETE COMPARISON:  06/14/2016 FINDINGS: Right Kidney: Length: 14 cm. Chronic marked hydronephrosis and hydroureter. Nuclear medicine renal function study  performed 03/19/2016. No internal debris or calculus is noted. Echogenic cortex consistent with medical renal disease. Left Kidney: Length: 9 cm. Echogenic cortex that is diffusely thinned. No hydronephrosis. Bladder: Marked prostatomegaly. Foley catheter present with collapsed, redundant appearing bladder. Bladder diverticulum is noted. There is gas within the urinary bladder lumen, nonspecific in the setting of catheterization. IMPRESSION: 1. No acute finding when compared to prior. 2. Chronic severe right hydroureteronephrosis. Reference renal function study 03/19/2016. 3. Medical renal disease and left renal atrophy. 4. Marked prostate enlargement. Foley catheter with collapsed bladder. Electronically Signed   By: Monte Fantasia M.D.   On: 07/28/2016 06:44    Time Spent in minutes  30   [name] M.D on 07/31/2016 at 7:44 AM  Between 7am to 7pm - Pager - [phone]  After 7pm go to [email or web address] - password [facility]  [facility] -  Office  [phone]

## 2016-07-31 NOTE — Progress Notes (Signed)
Pharmacy Antibiotic Note  Daniel [name] is a 82 y.o. male admitted on 07/27/2016 with UTI.  Pharmacy has been consulted for Cefepime dosing.  Plan: Continue Cefepime 1gm IV q24hr Follow up renal fxn, culture results, and clinical course.   Height: 5\' 5"  (165.1 cm) Weight: 145 lb 4.5 oz (65.9 kg) IBW/kg (Calculated) : 61.5  Temp (24hrs), Avg:97.8 F (36.6 C), Min:97.5 F (36.4 C), Max:98.5 F (36.9 C)   Recent Labs Lab 07/28/16 0115 07/28/16 0645 07/28/16 0930 07/28/16 1427 07/29/16 0939 07/30/16 0559 07/31/16 0005  WBC 7.2 5.4  --   --  3.7*  --   --   CREATININE 2.40* 2.01*  --   --  1.62* 1.48* 1.39*  LATICACIDVEN  --   --  2.5* 1.9  --   --   --     Estimated Creatinine Clearance: 28.3 mL/min (by C-G formula based on SCr of 1.39 mg/dL).    No Known Allergies  Antimicrobials this admission: Ceftriaxone 8/20 x1 Cefepime 8/20 >>  Dose adjustments this admission:  Microbiology results: 8/20 UCx: >100k colonies pseuodmonas (sens: cefepime, ceftaz, cipro, gent, imip, p/t)  Thank you for allowing pharmacy to be a part of this patient's care.  Gretta Arab PharmD, BCPS Pager [phone] 07/31/2016 8:30 AM

## 2016-07-31 NOTE — Progress Notes (Signed)
Physical Therapy Treatment Patient Details Name: Daniel [name] MRN: [id number] DOB: 1918-12-12 Today's Date: 08-29-2016    History of Present Illness  82 yo male with hx of lymphoma, Pancytopenia, apparently has had FTT, and protein calorie malnutrition admitted with fall, skin tears, UTI, AKI on CKD. Pt fell PTA while reacher for his RW which was farther away than he realized, he denies other falls in past couple years.     PT Comments    Pt assisted with ambulating in hallway and then to recliner.  Follow Up Recommendations  SNF;Supervision for mobility/OOB     Equipment Recommendations  Rolling walker with 5" wheels    Recommendations for Other Services       Precautions / Restrictions Precautions Precautions: Fall Precaution Comments: h/o recent falls    Mobility  Bed Mobility               General bed mobility comments: pt up on BSC with NT upon arrival  Transfers Overall transfer level: Needs assistance Equipment used: Rolling walker (2 wheeled) Transfers: Sit to/from Stand Sit to Stand: Min assist         General transfer comment: assist to rise and steady  Ambulation/Gait Ambulation/Gait assistance: Min guard Ambulation Distance (Feet): 120 Feet Assistive device: Rolling walker (2 wheeled) Gait Pattern/deviations: Step-through pattern;Decreased stride length     General Gait Details: increased external rotation and toe out on R LE, slightly antalgic gait - pt reports "bowed" L knee, 2/4 dyspnea    Stairs            Wheelchair Mobility    Modified Rankin (Stroke Patients Only)       Balance                                    Cognition Arousal/Alertness: Awake/alert Behavior During Therapy: WFL for tasks assessed/performed Overall Cognitive Status: Within Functional Limits for tasks assessed                      Exercises      General Comments        Pertinent Vitals/Pain Pain Assessment:  No/denies pain    Home Living                      Prior Function            PT Goals (current goals can now be found in the care plan section) Progress towards PT goals: Progressing toward goals    Frequency  Min 3X/week    PT Plan Current plan remains appropriate    Co-evaluation             End of Session Equipment Utilized During Treatment: Gait belt Activity Tolerance: Patient tolerated treatment well Patient left: in chair;with call bell/phone within reach;with chair alarm set     Time: [id number] PT Time Calculation (min) (ACUTE ONLY): 11 min  Charges:  $Gait Training: 8-22 mins                    G Codes:      Xavius Spadafore,KATHrine E 08-29-16, 2:03 PM Carmelia Bake, PT, DPT 08-29-2016 Pager: [phone]

## 2016-08-01 ENCOUNTER — Inpatient Hospital Stay (HOSPITAL_COMMUNITY): Payer: Medicare Other

## 2016-08-01 LAB — COMPREHENSIVE METABOLIC PANEL
ALK PHOS: 160 U/L — AB (ref 38–126)
ALT: 26 U/L (ref 17–63)
AST: 27 U/L (ref 15–41)
Albumin: 1.9 g/dL — ABNORMAL LOW (ref 3.5–5.0)
Anion gap: 3 — ABNORMAL LOW (ref 5–15)
BILIRUBIN TOTAL: 0.7 mg/dL (ref 0.3–1.2)
BUN: 34 mg/dL — AB (ref 6–20)
CALCIUM: 7.7 mg/dL — AB (ref 8.9–10.3)
CO2: 22 mmol/L (ref 22–32)
CREATININE: 1.28 mg/dL — AB (ref 0.61–1.24)
Chloride: 113 mmol/L — ABNORMAL HIGH (ref 101–111)
GFR, EST AFRICAN AMERICAN: 53 mL/min — AB (ref >60)
GFR, EST NON AFRICAN AMERICAN: 46 mL/min — AB (ref >60)
Glucose, Bld: 89 mg/dL (ref 65–99)
Potassium: 4.4 mmol/L (ref 3.5–5.1)
Sodium: 138 mmol/L (ref 135–145)
TOTAL PROTEIN: 3.5 g/dL — AB (ref 6.5–8.1)

## 2016-08-01 LAB — CBC
HEMATOCRIT: 25.6 % — AB (ref 39.0–52.0)
Hemoglobin: 7.9 g/dL — ABNORMAL LOW (ref 13.0–17.0)
MCH: 26.2 pg (ref 26.0–34.0)
MCHC: 30.9 g/dL (ref 30.0–36.0)
MCV: 84.8 fL (ref 78.0–100.0)
Platelets: 91 10*3/uL — ABNORMAL LOW (ref 150–400)
RBC: 3.02 MIL/uL — AB (ref 4.22–5.81)
RDW: 19.5 % — ABNORMAL HIGH (ref 11.5–15.5)
WBC: 3.2 10*3/uL — AB (ref 4.0–10.5)

## 2016-08-01 LAB — ECHOCARDIOGRAM COMPLETE
Height: 65 in
WEIGHTICAEL: 2324.53 [oz_av]

## 2016-08-01 LAB — CORTISOL: CORTISOL PLASMA: 15 ug/dL

## 2016-08-01 LAB — GLUCOSE, CAPILLARY: GLUCOSE-CAPILLARY: 85 mg/dL (ref 65–99)

## 2016-08-01 MED ORDER — PRO-STAT SUGAR FREE PO LIQD
30.0000 mL | Freq: Two times a day (BID) | ORAL | Status: DC
  Administered 2016-08-01 – 2016-08-02 (×3): 30 mL via ORAL
  Filled 2016-08-01 (×2): qty 30

## 2016-08-01 MED ORDER — SODIUM CHLORIDE 0.9 % IV SOLN
INTRAVENOUS | Status: AC
  Administered 2016-08-01 (×2): via INTRAVENOUS

## 2016-08-01 NOTE — Care Management Important Message (Signed)
Important Message  Patient Details  Name: HAYWARD CASS MRN: [id number] Date of Birth: 1919-11-20   Medicare Important Message Given:  Yes    Camillo Flaming 08/01/2016, 9:18 AMImportant Message  Patient Details  Name: BRAYLN KELLISON MRN: [id number] Date of Birth: 11/03/1919   Medicare Important Message Given:  Yes    Camillo Flaming 08/01/2016, 9:18 AM

## 2016-08-01 NOTE — Progress Notes (Signed)
Patient ID: Daniel [name], male   DOB: 12/28/1918, 82 y.o.   MRN: [id number]                                                                PROGRESS NOTE                                                                                                                                                                                                             Patient Demographics:    Daniel [name], is a 82 y.o. male, DOB - 1919-07-03, [id number]  Admit date - 07/27/2016   Admitting Physician Vianne Bulls, MD  Outpatient Primary MD for the patient is [name], MD  LOS - 4  Outpatient Specialists:   Chief Complaint  Patient presents with  . Fall       Brief Narrative  82 y.o.malewith medical history significant for non-Hodgkin's lymphoma, BPH, bronchiectasis, and chronic hydronephrosis with Foley catheter who presents from home for evaluation of skin tears suffered when he fell. Patient was admitted to the hospital last month with urinary tract infection and acute kidney injury was discharged to a nursing facility in much improved and stable condition, and was then discharged home with home health. Patient had refused to stay in SNF and, unfortunately he also refused home health and has been alone with his elderly son checking in on him. He ambulates with a walker and reports that he was in his usual state of health today when he tripped over his sock and fell to the ground.  Patient on further evaluation was found to have soft blood pressures and acute kidney injury.   Subjective:    Gari Tubridy today has been afebrile overnite. bp still soft,  Cortisol pending.  Pt very hoh.     No headache, No chest pain, No abdominal pain - No Nausea, No new weakness tingling or numbness, No Cough - SOB.    Assessment  & Plan :    Principal Problem:   AKI (acute kidney injury) ([place]) Active Problems:   Lymphoma, small lymphocytic ([place])   Anemia in neoplastic disease   Chronic kidney  disease (CKD), stage III (moderate)   Thrombocytopenia (HCC)   Essential hypertension   UTI (urinary tract infection)   Hypotension   Chronic diastolic  CHF (congestive heart failure) (HCC)   Facial laceration   UTI (lower urinary tract infection)     1.  Uti Pseudomonas, cont cefepime  2. Hypotension continues,  Cont ns iv today Check echo awaiting cortisol  3. LYmphoma, small lymphocytic Cont f/u with oncology once discharged.   4. Anemia Stable  5. Thrombocytopneia Stable  6. CHF (diastolic) Stable  7. Facial laceration Stable  8. ARF Improving Check cbc, cmp in am  9. Gait instability Pt to evaluate and tx, ? SNF  10. Severe protein calorie malnutrition   Code Status :  DNR  Family Communication  :  Disposition Plan  : ? SNF tomorrow.   Barriers For Discharge :   Consults  :    Procedures  :   DVT Prophylaxis  :   SCDs    Lab Results  Component Value Date   PLT 91 (L) 08/01/2016    Antibiotics  :    Anti-infectives    Start     Dose/Rate Route Frequency Ordered Stop   07/28/16 0500  ceFEPIme (MAXIPIME) 1 g in dextrose 5 % 50 mL IVPB     1 g 100 mL/hr over 30 Minutes Intravenous Daily 07/28/16 0343     07/28/16 0230  cefTRIAXone (ROCEPHIN) 1 g in dextrose 5 % 50 mL IVPB     1 g 100 mL/hr over 30 Minutes Intravenous  Once 07/28/16 0226 07/28/16 0322        Objective:   Vitals:   07/31/16 0454 07/31/16 1538 07/31/16 2200 08/01/16 0545  BP: (!) 85/40 (!) 101/52 (!) 134/45 (!) 88/36  Pulse: 80 79 (!) 106 81  Resp: 20 20    Temp: 98.5 F (36.9 C) 97.6 F (36.4 C) 98 F (36.7 C) 97.5 F (36.4 C)  TempSrc: Oral Oral Oral Oral  SpO2: 96% 100% 97% 95%  Weight:      Height:        Wt Readings from Last 3 Encounters:  07/30/16 65.9 kg (145 lb 4.5 oz)  07/10/16 55.8 kg (123 lb)  06/18/16 55.8 kg (123 lb)     Intake/Output Summary (Last 24 hours) at 08/01/16 0732 Last data filed at 08/01/16 0600  Gross per  24 hour  Intake              270 ml  Output             2300 ml  Net            -2030 ml     Physical Exam  Awake Alert, Oriented X 3, No new F.N deficits, Normal affect [place].AT,PERRAL Supple Neck,No JVD, No cervical lymphadenopathy appriciated.  Symmetrical Chest wall movement, Good air movement bilaterally, CTAB RRR,No Gallops,Rubs or new Murmurs, No Parasternal Heave +ve B.Sounds, Abd Soft, No tenderness, No organomegaly appriciated, No rebound - guarding or rigidity. No Cyanosis, Clubbing or edema, No new Rash or bruise      Data Review:    CBC  Recent Labs Lab 07/28/16 0115 07/28/16 0645 07/29/16 0939 08/01/16 0618  WBC 7.2 5.4 3.7* 3.2*  HGB 8.1* 7.6* 8.1* 7.9*  HCT 26.2* 25.0* 26.9* 25.6*  PLT 124* 124* 114* 91*  MCV 82.6 86.2 85.7 84.8  MCH 25.6* 26.2 25.8* 26.2  MCHC 30.9 30.4 30.1 30.9  RDW 18.8* 19.3* 19.1* 19.5*  LYMPHSABS 1.2 1.5  --   --   MONOABS 0.8 0.7  --   --   EOSABS 0.0 0.0  --   --  BASOSABS 0.0 0.0  --   --     Chemistries   Recent Labs Lab 07/28/16 0645 07/29/16 0939 07/30/16 0559 07/31/16 0005 08/01/16 0618  NA 137 137 139 136 138  K 4.1 4.2 4.5 4.5 4.4  CL 108 111 114* 112* 113*  CO2 23 22 22 22 22   GLUCOSE 123* 128* 90 111* 89  BUN 38* 39* 41* 37* 34*  CREATININE 2.01* 1.62* 1.48* 1.39* 1.28*  CALCIUM 7.5* 7.5* 7.6* 7.5* 7.7*  AST  --   --   --   --  27  ALT  --   --   --   --  26  ALKPHOS  --   --   --   --  160*  BILITOT  --   --   --   --  0.7   ------------------------------------------------------------------------------------------------------------------ No results for input(s): CHOL, HDL, LDLCALC, TRIG, CHOLHDL, LDLDIRECT in the last 72 hours.  No results found for: HGBA1C ------------------------------------------------------------------------------------------------------------------ No results for input(s): TSH, T4TOTAL, T3FREE, THYROIDAB in the last 72 hours.  Invalid input(s):  FREET3 ------------------------------------------------------------------------------------------------------------------ No results for input(s): VITAMINB12, FOLATE, FERRITIN, TIBC, IRON, RETICCTPCT in the last 72 hours.  Coagulation profile  Recent Labs Lab 07/28/16 0645  INR 1.24    No results for input(s): DDIMER in the last 72 hours.  Cardiac Enzymes No results for input(s): CKMB, TROPONINI, MYOGLOBIN in the last 168 hours.  Invalid input(s): CK ------------------------------------------------------------------------------------------------------------------ No results found for: BNP  Inpatient Medications  Scheduled Meds: . antiseptic oral rinse  7 mL Mouth Rinse q12n4p  . ceFEPime (MAXIPIME) IV  1 g Intravenous Q0600  . chlorhexidine  15 mL Mouth Rinse BID  . famotidine  20 mg Oral Daily  . finasteride  5 mg Oral Q breakfast  . multivitamin  1 tablet Oral BID  . omega-3 acid ethyl esters  1 g Oral Q breakfast  . polyvinyl alcohol  1 drop Both Eyes BID  . sodium chloride flush  3 mL Intravenous Q12H   Continuous Infusions:  PRN Meds:.acetaminophen **OR** acetaminophen, albuterol, bisacodyl, HYDROcodone-acetaminophen, ondansetron **OR** ondansetron (ZOFRAN) IV, polyethylene glycol  Micro Results Recent Results (from the past 240 hour(s))  Urine culture     Status: Abnormal   Collection Time: 07/28/16  2:23 AM  Result Value Ref Range Status   Specimen Description URINE, RANDOM  Final   Special Requests NONE  Final   Culture >=100,000 COLONIES/mL PSEUDOMONAS AERUGINOSA (A)  Final   Report Status 07/30/2016 FINAL  Final   Organism ID, Bacteria PSEUDOMONAS AERUGINOSA (A)  Final      Susceptibility   Pseudomonas aeruginosa - MIC*    CEFTAZIDIME 4 SENSITIVE Sensitive     CIPROFLOXACIN <=0.25 SENSITIVE Sensitive     GENTAMICIN 4 SENSITIVE Sensitive     IMIPENEM 1 SENSITIVE Sensitive     PIP/TAZO <=4 SENSITIVE Sensitive     CEFEPIME 4 SENSITIVE Sensitive     *  >=100,000 COLONIES/mL PSEUDOMONAS AERUGINOSA    Radiology Reports US Renal  Result Date: 07/28/2016 CLINICAL DATA:  Acute kidney injury EXAM: RENAL / URINARY TRACT ULTRASOUND COMPLETE COMPARISON:  06/14/2016 FINDINGS: Right Kidney: Length: 14 cm. Chronic marked hydronephrosis and hydroureter. Nuclear medicine renal function study performed 03/19/2016. No internal debris or calculus is noted. Echogenic cortex consistent with medical renal disease. Left Kidney: Length: 9 cm. Echogenic cortex that is diffusely thinned. No hydronephrosis. Bladder: Marked prostatomegaly. Foley catheter present with collapsed, redundant appearing bladder. Bladder diverticulum is noted. There is gas within  the urinary bladder lumen, nonspecific in the setting of catheterization. IMPRESSION: 1. No acute finding when compared to prior. 2. Chronic severe right hydroureteronephrosis. Reference renal function study 03/19/2016. 3. Medical renal disease and left renal atrophy. 4. Marked prostate enlargement. Foley catheter with collapsed bladder. Electronically Signed   By: Monte Fantasia M.D.   On: 07/28/2016 06:44    Time Spent in minutes  30   [name] M.D on 08/01/2016 at 7:32 AM  Between 7am to 7pm - Pager - [phone]  After 7pm go to [email or web address] - password [facility]  [facility] -  Office  [phone]

## 2016-08-01 NOTE — Consult Note (Signed)
   [facility] CM Inpatient Consult   08/01/2016  Daniel [name] Jun 24, 1919 [id number]    Patient screened for potential [facility] Care Management services. Chart reviewed. Noted discharge plan is for SNF. Spoke with inpatient RNCM who states patient is now agreeable to SNF. Will not engage for [place] Management services at this time. If patient's post hospital needs change, please place a [facility] Care Management consult. For questions please contact:  Marthenia Rolling, [place], RN,BSN [facility] Liaison [phone]

## 2016-08-01 NOTE — Clinical Social Work Note (Signed)
Clinical Social Work Assessment  Patient Details  Name: Daniel [name] MRN: [id number] Date of Birth: [birth year]  Date of referral:  08/01/16               Reason for consult:  Facility Placement                Permission sought to share information with:  Family Supports, Customer service manager Permission granted to share information::     Name::        Agency::     Relationship::  Son: Consulting civil engineer Information:  [phone]  Housing/Transportation Living arrangements for the past 2 months:  [place] of Information:  Patient Patient Interpreter Needed:  None Criminal Activity/Legal Involvement Pertinent to Current Situation/Hospitalization:  No - Comment as needed Significant Relationships:  Adult Children, Friend, Other Family Members Lives with:  Self Do you feel safe going back to the place where you live?  No Need for family participation in patient care:  Yes (Comment)  Care giving concerns:  Patient reports he has been living alone for a year now. He reports, " I have been looking after myself and did something that I was not suppose to do and I fell." Patient expressed he enjoys his autonomy but understands he needs more support at home. Patient has been to [facility] in the past and would like to return if possible.    Social Worker assessment / plan: LCSWA met with patient at bedside, pt. Agreeable to assessment. Patient stated,  "I feel I am causing trouble." LCSWA provided emotional support and assured the patient he not causing trouble but a life transition that happens to many people.   Plan: Assist with patient disposition to SNF.    Employment status:  Retired Forensic scientist:  [employer] PT Recommendations:  [place], [place] / Referral to community resources:  [place]  Patient/Family's Response to care: Agreeable to SNF now after speaking to MD, and discussing  it with his Son. Patient is  responding well to care.   Patient/Family's Understanding of and Emotional Response to Diagnosis, Current Treatment, and Prognosis: "I know I need help with cooking meals and other things around the house." "Since being in the hospital I have learned that my blood pressure has been low." Patient is agreeable to rehab for next level of treatment.   Emotional Assessment Appearance:  Appears stated age Attitude/Demeanor/Rapport:    Affect (typically observed):  Accepting, Pleasant, Hopeful Orientation:  Oriented to Self, Oriented to Place, Oriented to  Time, Oriented to Situation Alcohol / Substance use:  Not Applicable Psych involvement (Current and /or in the community):  No (Comment)  Discharge Needs  Concerns to be addressed:  Care Coordination Readmission within the last 30 days:  No Current discharge risk:  Dependent with Mobility Barriers to Discharge:  No Barriers Identified   Lia Hopping, LCSW 08/01/2016, 12:44 PM

## 2016-08-01 NOTE — NC FL2 (Signed)
[place] LEVEL OF CARE SCREENING TOOL     IDENTIFICATION  Patient Name: Daniel [name] Birthdate: 1919/10/06 Sex: male Admission Date (Current Location): 07/27/2016  [facility] and [place] Number:  Herbalist and Address:  [facility],  [place] [address], [place]      Provider Number: [id number]  Attending Physician Name and Address:  Venetia Maxon Rama, MD  Relative Name and Phone Number:       Current Level of Care: Hospital Recommended Level of Care: [place] Prior Approval Number:    Date Approved/Denied:   PASRR Number:    Discharge Plan: SNF    Current Diagnoses: Patient Active Problem List   Diagnosis Date Noted  . AKI (acute kidney injury) ([place]) 07/28/2016  . Hypotension 07/28/2016  . Chronic diastolic CHF (congestive heart failure) ([place]) 07/28/2016  . Facial laceration   . UTI (lower urinary tract infection)   . UTI (urinary tract infection) 06/15/2016  . Acute renal failure ([place]) 06/14/2016  . Acute renal failure (ARF) ([place]) 06/14/2016  . Bilateral leg edema 06/07/2016  . Pancytopenia, acquired ([place]) 06/06/2016  . B12 deficiency 06/06/2016  . Acute respiratory failure ([place]) 03/19/2016  . Protein-calorie malnutrition, severe 03/15/2016  . Hypoxia 03/14/2016  . Lesion of left lung 03/07/2016  . Protein-calorie malnutrition, moderate ([place]) 03/07/2016  . Thrombocytopenia ([place]) 06/01/2015  . Hernia, inguinal, right 06/01/2015  . Essential hypertension 06/01/2015  . Chronic kidney disease (CKD), stage III (moderate) 01/10/2015  . 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 Preventive measure 11/14/2014  . Hydronephrosis, right 10/27/2014  . Cellulitis of arm, right 02/11/2014  . Bronchiectasis without acute exacerbation ([place]) 06/09/2013  . Hydroureteronephrosis 05/12/2012  . Lymphoma, small lymphocytic ([place]) 11/10/2011  . 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 CHRONIC AIRWAY OBSTRUCTION NEC 09/11/2009     Orientation RESPIRATION BLADDER Height & Weight     Self, Time, Situation, Place  Normal Indwelling catheter, Incontinent Weight: 145 lb 4.5 oz (65.9 kg) Height:  5\' 5"  (165.1 cm)  BEHAVIORAL SYMPTOMS/MOOD NEUROLOGICAL BOWEL NUTRITION STATUS      Continent Diet (Normal)  AMBULATORY STATUS COMMUNICATION OF NEEDS Skin   Limited Assist Verbally Normal                       Personal Care Assistance Level of Assistance  Bathing, Feeding, Dressing Bathing Assistance: Independent Feeding assistance: Independent Dressing Assistance: Independent     Functional Limitations Info  Sight, Hearing, Speech Sight Info: Impaired Hearing Info: Impaired Speech Info: Adequate    SPECIAL CARE FACTORS FREQUENCY  PT (By licensed PT)     PT Frequency: 3              Contractures Contractures Info: Not present    Additional Factors Info  Code Status, Allergies Code Status Info: FullCode Allergies Info: No Known Allergies           Current Medications (08/01/2016):  This is the current hospital active medication list Current Facility-Administered Medications  Medication Dose Route Frequency Provider Last Rate Last Dose  . 0.9 %  sodium chloride infusion   Intravenous Continuous [name], MD 75 mL/hr at 08/01/16 1040    . acetaminophen (TYLENOL) tablet 650 mg  650 mg Oral Q6H PRN Vianne Bulls, MD       Or  . acetaminophen (TYLENOL) suppository 650 mg  650 mg Rectal Q6H PRN Vianne Bulls, MD      . albuterol (PROVENTIL) (2.5  MG/3ML) 0.083% nebulizer solution 2.5 mg  2.5 mg Nebulization Q4H PRN Vianne Bulls, MD      . antiseptic oral rinse (CPC / CETYLPYRIDINIUM CHLORIDE 0.05%) solution 7 mL  7 mL Mouth Rinse q12n4p Velvet Bathe, MD   7 mL at 07/30/16 1615  . bisacodyl (DULCOLAX) EC tablet 5 mg  5 mg Oral Daily PRN Ilene Qua Opyd, MD      . ceFEPIme (MAXIPIME) 1 g in dextrose 5 % 50 mL IVPB  1 g Intravenous Q0600 Vianne Bulls, MD   1 g at 08/01/16 0554  . chlorhexidine  (PERIDEX) 0.12 % solution 15 mL  15 mL Mouth Rinse BID Velvet Bathe, MD   15 mL at 08/01/16 1039  . famotidine (PEPCID) tablet 20 mg  20 mg Oral Daily Randa Spike, RPH   20 mg at 08/01/16 1039  . feeding supplement (PRO-STAT SUGAR FREE 64) liquid 30 mL  30 mL Oral BID [name], MD   30 mL at 08/01/16 1040  . finasteride (PROSCAR) tablet 5 mg  5 mg Oral Q breakfast Vianne Bulls, MD   5 mg at 08/01/16 0825  . HYDROcodone-acetaminophen (NORCO/VICODIN) 5-325 MG per tablet 1-2 tablet  1-2 tablet Oral Q4H PRN Vianne Bulls, MD      . multivitamin (PROSIGHT) tablet 1 tablet  1 tablet Oral BID Vianne Bulls, MD   1 tablet at 08/01/16 1039  . omega-3 acid ethyl esters (LOVAZA) capsule 1 g  1 g Oral Q breakfast Vianne Bulls, MD   1 g at 08/01/16 0825  . ondansetron (ZOFRAN) tablet 4 mg  4 mg Oral Q6H PRN Vianne Bulls, MD       Or  . ondansetron (ZOFRAN) injection 4 mg  4 mg Intravenous Q6H PRN Ilene Qua Opyd, MD      . polyethylene glycol (MIRALAX / GLYCOLAX) packet 17 g  17 g Oral Daily PRN Vianne Bulls, MD      . polyvinyl alcohol (LIQUIFILM TEARS) 1.4 % ophthalmic solution 1 drop  1 drop Both Eyes BID Vianne Bulls, MD   1 drop at 08/01/16 1039  . sodium chloride flush (NS) 0.9 % injection 3 mL  3 mL Intravenous Q12H Vianne Bulls, MD   3 mL at 07/31/16 2200     Discharge Medications: Please see discharge summary for a list of discharge medications.  Relevant Imaging Results:  Relevant Lab Results:   Additional Information SSN: [id number]  Lia Hopping, LCSW

## 2016-08-01 NOTE — Progress Notes (Signed)
  Echocardiogram 2D Echocardiogram has been performed.  Daniel [name] 08/01/2016, 9:47 AM

## 2016-08-02 LAB — GLUCOSE, CAPILLARY: Glucose-Capillary: 81 mg/dL (ref 65–99)

## 2016-08-02 MED ORDER — POLYETHYLENE GLYCOL 3350 17 G PO PACK: 17.0000 g | PACK | Freq: Every day | ORAL | 0 refills | Status: AC | PRN

## 2016-08-02 MED ORDER — PRO-STAT SUGAR FREE PO LIQD: 30.0000 mL | Freq: Two times a day (BID) | ORAL | 0 refills | Status: AC

## 2016-08-02 MED ORDER — CIPROFLOXACIN HCL 250 MG PO TABS: 250.0000 mg | ORAL_TABLET | Freq: Two times a day (BID) | ORAL | 0 refills | Status: AC

## 2016-08-02 MED ORDER — SODIUM CHLORIDE 0.9 % IV BOLUS (SEPSIS)
250.0000 mL | Freq: Once | INTRAVENOUS | Status: AC
  Administered 2016-08-02: 250 mL via INTRAVENOUS

## 2016-08-02 MED ORDER — CIPROFLOXACIN HCL 500 MG PO TABS: 500.0000 mg | ORAL_TABLET | Freq: Two times a day (BID) | ORAL | 0 refills | Status: DC

## 2016-08-02 MED ORDER — SODIUM CHLORIDE 0.9 % IV SOLN
INTRAVENOUS | Status: DC
  Administered 2016-08-02: 09:00:00 via INTRAVENOUS

## 2016-08-02 MED ORDER — CIPROFLOXACIN HCL 500 MG PO TABS
250.0000 mg | ORAL_TABLET | Freq: Two times a day (BID) | ORAL | Status: DC
  Administered 2016-08-02: 250 mg via ORAL
  Filled 2016-08-02: qty 1

## 2016-08-02 MED ORDER — POLYETHYLENE GLYCOL 3350 17 G PO PACK
17.0000 g | PACK | Freq: Every day | ORAL | Status: DC | PRN
  Administered 2016-08-02: 17 g via ORAL

## 2016-08-02 NOTE — Discharge Summary (Addendum)
Daniel [name], is a 82 y.o. male  DOB 04-04-1919  MRN [id number].  Admission date:  07/27/2016  Admitting Physician  Vianne Bulls, MD  Discharge Date:  08/02/2016   Primary MD  [name], MD  Recommendations for primary care physician for things to follow:   Hypotension ? Secondary to dehydration Please monitor bp If still on low side consider fludricortisone  Uti  (pseudomonas ) Cefepime=> cipro 250 mg po bid x 5 days  AKI Check bmp in 3-5 days.   Facial laceration Stable, improved.   Lymphoma Continue on imbrutinib  Protein Calorie malnutrition Cont prostat  Bph/Urinary retention/ chronic hydronephrosis Cont foley catheter Cont finasteride Please have f/u with urology in 2-4 weeks.   Anemia Repeat cbc in 3-5 days Please start on ferrous sulfate 325mg  po qday and vitamin b12 2015mcg po qday once finished with cipro. thanks  Admission Diagnosis  UTI (lower urinary tract infection) [N39.0] AKI (acute kidney injury) ([place]) [N17.9] Facial laceration, initial encounter [S01.81XA] Fall, initial encounter [W19.XXXA] Hypotension, unspecified hypotension type [I95.9]   Discharge Diagnosis  UTI (lower urinary tract infection) [N39.0] AKI (acute kidney injury) ([place]) [N17.9] Facial laceration, initial encounter [S01.81XA] Fall, initial encounter [W19.XXXA] Hypotension, unspecified hypotension type [I95.9]    Principal Problem:   AKI (acute kidney injury) ([place]) Active Problems:   Lymphoma, small lymphocytic (HCC)   Anemia in neoplastic disease   Chronic kidney disease (CKD), stage III (moderate)   Thrombocytopenia (HCC)   Essential hypertension   UTI (urinary tract infection)   Hypotension   Chronic diastolic CHF (congestive heart failure) (HCC)   Facial laceration   UTI (lower urinary tract infection)      Past Medical History:  Diagnosis Date  . B12 deficiency 06/06/2016   . BPH (benign prostatic hyperplasia)   . Congenital malformation of spine    T12  . Cough for last 2 years   occasional white sputum  . Diverticulosis   . History of kidney stones 2013  . HOH (hard of hearing)   . Hx of hydronephrosis   . Hydronephrosis 10/27/2014  . NHL (non-Hodgkin's lymphoma) ([place])    nhl dx 5/09;  . Non-Hodgkin lymphoma ([place])   . Prostate enlargement   . Sleep apnea     Past Surgical History:  Procedure Laterality Date  . BOTOX INJECTION N/A 07/15/2013   Procedure: BOTOX INJECTION;  Surgeon: Milus Banister, MD;  Location: [facility];  Service: Endoscopy;  Laterality: N/A;  . CHOLECYSTECTOMY  yrs ago  . ESOPHAGOGASTRODUODENOSCOPY (EGD) WITH PROPOFOL N/A 07/15/2013   Procedure: ESOPHAGOGASTRODUODENOSCOPY (EGD) WITH PROPOFOL;  Surgeon: Milus Banister, MD;  [facility];  Service: Endoscopy;  Laterality: N/A;  botox injection  . EYE SURGERY  yrs ago   Implants bilateral  . INGUINAL HERNIA REPAIR  yrs ago   left  . PROSTATE SURGERY  yrs ago       HPI  from the history and physical done on the day of admission:  82 y.o. male with medical history significant for non-Hodgkin's lymphoma, BPH, bronchiectasis, and chronic hydronephrosis with Foley catheter who presents from home for evaluation of skin tears suffered when he fell. Patient was admitted to the hospital last month with urinary tract infection and acute kidney injury was discharged to a nursing facility in much improved and stable condition, and was then discharged home with home health. Patient had refused to stay in SNF and, unfortunately he also refused home health and has been alone with his elderly son checking in on him. He ambulates with a walker and reports that he was in his usual state of health today when he tripped over his sock and fell to the ground. He denies any loss of consciousness, but did rub his scalp and cause a skin tear. There is another tear in his skin at the right  shoulder. He denied any significant pain associated with this. He denies any recent fevers or chills, lightheadedness, change in vision or hearing, or focal numbness or weakness. She denies any chest pain or palpitations. He endorses a chronic cough that is unchanged and denies any significant dyspnea. He has been managed with a Foley catheter that is typically changed about once a month and his urologist office. Patient notes that the urologist himself usually changes the Foley out and when it was last changed at outside hospital, they had to use a guidewire for placement.  ED Course: Upon arrival to the ED, patient is found to be afebrile, saturating well on room air, mildly tachycardic, and with blood pressure 90/47. Chemistry panel is notable for a serum creatinine of 2.40, up from 1.2 last month. CBC features a hemoglobin of 8.1, down from 9.1 one month ago, and a thrombocytopenia 224,000. There was purulent urine collected from the Foley and sent for analysis which is consistent with infection. Sample was sent for culture. Patient was given a 1 L normal saline bolus and empiric Rocephin in the ED. Blood pressure has stabilized in the [id number] range systolic following the fluid bolus and tachycardia has resolved. Patient will be observed on the telemetry unit for ongoing evaluation and management for catheter associated UTI and acute kidney injury, as well as normocytic anemia with concerning downward trend in hemoglobin.    Hospital Course:     Pt was started on rocephin which was changed to cefepime due to urine growing out pseudomonas on 8/22, pt has been afebrile.  Pt was hydrate with ns iv gently and creatinine improved to 2.4 (8/20)=> 1.28.  Pt has severe protein calorie malnutrition and is on prostat.  Pt has had soft bp with sbp high 80's ,  With iv hydration has improved to sbp 104. Pt appears stable and will be discharge to SNF for rehab.   Follow UP  Follow-up Information    [name], MD  Follow up in 1 week(s).   Specialty:  Internal Medicine Contact information: [address] [place] [address] [phone]            Consults obtained -  Discharge Condition: stable  Diet and Activity recommendation: See Discharge Instructions below  Discharge Instructions         Discharge Medications       Medication List    STOP taking these medications   fish oil-omega-3 fatty acids 1000 MG capsule     TAKE these medications   albuterol (2.5 MG/3ML) 0.083% nebulizer solution Commonly known as:  PROVENTIL Take 3 mLs (2.5 mg total)  by nebulization every 6 (six) hours as needed for wheezing or shortness of breath.   ciprofloxacin 250 MG tablet Commonly known as:  CIPRO Take 1 tablet (250 mg total) by mouth 2 (two) times daily.   famotidine 10 MG tablet Commonly known as:  PEPCID Take 1 tablet (10 mg total) by mouth daily.   feeding supplement (PRO-STAT SUGAR FREE 64) Liqd Take 30 mLs by mouth 2 (two) times daily.   finasteride 5 MG tablet Commonly known as:  PROSCAR Take 5 mg by mouth daily with breakfast.   GENTEAL OP Place 1 drop into both eyes 2 (two) times daily. Dry eyes   glucosamine-chondroitin 500-400 MG tablet Take 1 tablet by mouth every morning.   ibrutinib 140 MG capsul Commonly known as:  IMBRUVICA Take 420 mg by mouth every morning.   ICAPS AREDS 2 Caps Take 1 capsule by mouth 2 (two) times daily.   polyethylene glycol packet Commonly known as:  MIRALAX / GLYCOLAX Take 17 g by mouth daily as needed for mild constipation or moderate constipation.       Major procedures and Radiology Reports - PLEASE review detailed and final reports for all details, in brief -      US Renal  Result Date: 07/28/2016 CLINICAL DATA:  Acute kidney injury EXAM: RENAL / URINARY TRACT ULTRASOUND COMPLETE COMPARISON:  06/14/2016 FINDINGS: Right Kidney: Length: 14 cm. Chronic marked hydronephrosis and hydroureter. Nuclear  medicine renal function study performed 03/19/2016. No internal debris or calculus is noted. Echogenic cortex consistent with medical renal disease. Left Kidney: Length: 9 cm. Echogenic cortex that is diffusely thinned. No hydronephrosis. Bladder: Marked prostatomegaly. Foley catheter present with collapsed, redundant appearing bladder. Bladder diverticulum is noted. There is gas within the urinary bladder lumen, nonspecific in the setting of catheterization. IMPRESSION: 1. No acute finding when compared to prior. 2. Chronic severe right hydroureteronephrosis. Reference renal function study 03/19/2016. 3. Medical renal disease and left renal atrophy. 4. Marked prostate enlargement. Foley catheter with collapsed bladder. Electronically Signed   By: Monte Fantasia M.D.   On: 07/28/2016 06:44   Dg Chest Port 1 View  Result Date: 08/01/2016 CLINICAL DATA:  Non-Hodgkin's lymphoma.  Wheezing and cough. EXAM: PORTABLE CHEST 1 VIEW COMPARISON:  March 14, 2016 FINDINGS: Minimal opacity in the medial right lung base may be atelectasis or early infiltrate. No other interval changes or acute abnormalities. IMPRESSION: Nonspecific minimal opacity in the medial right lung base. Recommend follow-up to resolution. Electronically Signed   By: Dorise Bullion III M.D   On: 08/01/2016 16:44    Micro Results     Recent Results (from the past 240 hour(s))  Urine culture     Status: Abnormal   Collection Time: 07/28/16  2:23 AM  Result Value Ref Range Status   Specimen Description URINE, RANDOM  Final   Special Requests NONE  Final   Culture >=100,000 COLONIES/mL PSEUDOMONAS AERUGINOSA (A)  Final   Report Status 07/30/2016 FINAL  Final   Organism ID, Bacteria PSEUDOMONAS AERUGINOSA (A)  Final      Susceptibility   Pseudomonas aeruginosa - MIC*    CEFTAZIDIME 4 SENSITIVE Sensitive     CIPROFLOXACIN <=0.25 SENSITIVE Sensitive     GENTAMICIN 4 SENSITIVE Sensitive     IMIPENEM 1 SENSITIVE Sensitive     PIP/TAZO <=4  SENSITIVE Sensitive     CEFEPIME 4 SENSITIVE Sensitive     * >=100,000 COLONIES/mL PSEUDOMONAS AERUGINOSA       Today   Subjective  Blakley Laverde today has no dysuria, hematuria.  no headache,no chest abdominal pain,no new weakness tingling or numbness, feels much better wants to go to SNF today.    Objective   Blood pressure (!) 106/47, pulse 79, temperature 97.5 F (36.4 C), temperature source Axillary, resp. rate 19, height 5\' 5"  (1.651 m), weight 65.9 kg (145 lb 4.5 oz), SpO2 100 %.   Intake/Output Summary (Last 24 hours) at 08/02/16 1336 Last data filed at 08/02/16 1321  Gross per 24 hour  Intake          1201.25 ml  Output             2650 ml  Net         -1448.75 ml    Exam Awake Alert, Oriented x 3, No new F.N deficits, Normal affect, bruise on right forehead [place].AT,PERRAL Supple Neck,No JVD, No cervical lymphadenopathy appriciated.  Symmetrical Chest wall movement, Good air movement bilaterally, CTAB RRR,No Gallops,Rubs or new Murmurs, No Parasternal Heave +ve B.Sounds, Abd Soft, Non tender, No organomegaly appriciated, No rebound -guarding or rigidity. No Cyanosis, Clubbing or edema, No new Rash or bruise   Data Review   CBC w Diff:  Lab Results  Component Value Date   WBC 3.2 (L) 08/01/2016   HGB 7.9 (L) 08/01/2016   HGB 12.8 (L) 06/06/2016   HCT 25.6 (L) 08/01/2016   HCT 40.5 06/06/2016   PLT 91 (L) 08/01/2016   PLT 146 06/06/2016   LYMPHOPCT 28 07/28/2016   LYMPHOPCT 29.8 06/06/2016   BANDSPCT 0 11/09/2014   MONOPCT 13 07/28/2016   MONOPCT 13.7 06/06/2016   EOSPCT 0 07/28/2016   EOSPCT 0.0 06/06/2016   BASOPCT 0 07/28/2016   BASOPCT 0.3 06/06/2016    CMP:  Lab Results  Component Value Date   NA 138 08/01/2016   NA 141 06/20/2016   NA 136 06/06/2016   K 4.4 08/01/2016   K 4.0 06/06/2016   CL 113 (H) 08/01/2016   CL 103 04/19/2013   CO2 22 08/01/2016   CO2 27 06/06/2016   BUN 34 (H) 08/01/2016   BUN 32 (A) 06/20/2016   BUN 42.1 (H)  06/06/2016   CREATININE 1.28 (H) 08/01/2016   CREATININE 2.0 (H) 06/06/2016   GLU 92 06/20/2016   PROT 3.5 (L) 08/01/2016   PROT 6.0 (L) 06/06/2016   ALBUMIN 1.9 (L) 08/01/2016   ALBUMIN 2.8 (L) 06/06/2016   BILITOT 0.7 08/01/2016   BILITOT 1.04 06/06/2016   ALKPHOS 160 (H) 08/01/2016   ALKPHOS 143 06/06/2016   AST 27 08/01/2016   AST 17 06/06/2016   ALT 26 08/01/2016   ALT 12 06/06/2016  .   Total Time in preparing paper work, data evaluation and todays exam - 71 minutes  [name] M.D on 08/02/2016 at 1:36 PM  [facility]   Office  [phone]   Addendum  Received request from nursing home with regard to ibrutinib. Family will not be able to obtain this medication for a few days. It is okay to hold iburtinib until family is able to obtain the prescription from the patient's pharmacy.  Cordelia Poche, MD [facility] 08/02/2016, 3:32 PM Pager: [phone]

## 2016-08-02 NOTE — Progress Notes (Addendum)
Patient has been accepted to [facility], Patient son Rush Landmark has agreed to complete admission paperwork at 1:00pm.

## 2016-08-02 NOTE — Consult Note (Signed)
SLP Cancellation Note  Patient Details Name: Daniel [name] MRN: [id number] DOB: [birth year]   Cancelled treatment:        Orders for BSE cancelled per Dr. Lonny Prude.   Shonna Chock 08/02/2016, 2:29 PM  Ender Rorke B. [place], [place], [place]

## 2016-08-02 NOTE — Clinical Social Work Placement (Signed)
   CLINICAL SOCIAL WORK PLACEMENT  NOTE  Date:  08/02/2016  Patient Details  Name: Daniel [name] MRN: [id number] Date of Birth: Apr 24, 1919  Clinical Social Work is seeking post-discharge placement for this patient at the [place] level of care (*CSW will initial, date and re-position this form in  chart as items are completed):  Yes   Patient/family provided with [facility] Work Department's list of facilities offering this level of care within the geographic area requested by the patient (or if unable, by the patient's family).  Yes   Patient/family informed of their freedom to choose among providers that offer the needed level of care, that participate in Medicare, Medicaid or managed care program needed by the patient, have an available bed and are willing to accept the patient.  Yes   Patient/family informed of [facility]'s ownership interest in [facility] and [facility], as well as of the fact that they are under no obligation to receive care at these facilities.  PASRR submitted to EDS on       PASRR number received on       Existing PASRR number confirmed on 08/02/16     FL2 transmitted to all facilities in geographic area requested by pt/family on       FL2 transmitted to all facilities within larger geographic area on 08/01/16     Patient informed that his/her managed care company has contracts with or will negotiate with certain facilities, including the following:  [facility]     Yes   Patient/family informed of bed offers received.  Patient chooses bed at [facility]     Physician recommends and patient chooses bed at      Patient to be transferred to [facility] on 08/02/16.  Patient to be transferred to facility by [facility]     Patient family notified on 08/02/16 of transfer.  Name of family member notified:  SonRush Landmark :[place] Please prepare  prescriptions, Please sign DNR     Additional Comment:    _______________________________________________ Lia Hopping, LCSW 08/02/2016, 11:08 AM

## 2016-08-02 NOTE — Discharge Instructions (Signed)
Please f/u with SNF physician in 1-3 days. Or pcp in 1 week

## 2016-09-04 ENCOUNTER — Other Ambulatory Visit: Payer: Self-pay | Admitting: Hematology and Oncology

## 2016-09-04 DIAGNOSIS — C8300 Small cell B-cell lymphoma, unspecified site: Secondary | ICD-10-CM

## 2016-09-05 ENCOUNTER — Other Ambulatory Visit: Payer: Medicare Other

## 2016-09-05 ENCOUNTER — Ambulatory Visit: Payer: Medicare Other | Admitting: Hematology and Oncology

## 2016-09-23 ENCOUNTER — Ambulatory Visit: Payer: Medicare Other | Admitting: Gastroenterology

## 2016-10-01 ENCOUNTER — Encounter: Payer: Self-pay | Admitting: Gastroenterology

## 2016-10-01 ENCOUNTER — Ambulatory Visit (INDEPENDENT_AMBULATORY_CARE_PROVIDER_SITE_OTHER): Payer: Medicare Other | Admitting: Gastroenterology

## 2016-10-01 VITALS — BP 92/60 | HR 92 | Ht 65.0 in | Wt 123.0 lb

## 2016-10-01 DIAGNOSIS — R634 Abnormal weight loss: Secondary | ICD-10-CM

## 2016-10-01 NOTE — Progress Notes (Signed)
Review of gastrointestinal problems:  1. Presumed achalasia. Previously a patient of Dr. Lajoyce Corners with extensive workup (Failed manometry testing, the catheter coiled in his esophagus) . Most recent EGD November, 2010 found tight GE junction, slightly dilated distal esophagus, findings consistent with achalasia, botulinum toxin was injected. Injection improved his swallowing significantly however he is still careful about what he eats, eats slowly, takes small bites, chews his food very well.  Repeat EGD, botox injection 07/2013 for worsening dysphagia with good improvement in his swallowing.    HPI: This is a very pleasant 82 year old man  Chief complaint is weight loss, history of presumed achalasia  He has lost 19 pounds in the past 3 years since his last visit here he tells me that as long as he chews his food very well he can swallow without difficulty. He has difficulty chewing the meats and breads so well. He drinks 1 nutritious drink supplement once daily  His son was with him today   ROS: complete GI ROS as described in HPI.  Constitutional:  No unintentional weight loss   Past Medical History:  Diagnosis Date  . Anemia   . B12 deficiency 06/06/2016  . BPH (benign prostatic hyperplasia)   . Congenital malformation of spine    T12  . Cough for last 2 years   occasional white sputum  . Diverticulosis   . GERD with esophagitis   . History of kidney stones 2013  . HOH (hard of hearing)   . Hx of hydronephrosis   . Hydronephrosis 10/27/2014  . NHL (non-Hodgkin's lymphoma) ([place])    nhl dx 5/09;  . Pneumonia   . Prostate enlargement   . Sleep apnea   . 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 BOTOX INJECTION N/A 07/15/2013   Procedure: BOTOX INJECTION;  Surgeon: Milus Banister, MD;  [facility];  Service: Endoscopy;  Laterality: N/A;  . CHOLECYSTECTOMY  yrs ago  . ESOPHAGOGASTRODUODENOSCOPY (EGD) WITH PROPOFOL N/A  07/15/2013   Procedure: ESOPHAGOGASTRODUODENOSCOPY (EGD) WITH PROPOFOL;  Surgeon: Milus Banister, MD;  [facility];  Service: Endoscopy;  Laterality: N/A;  botox injection  . EYE SURGERY  yrs ago   Implants bilateral  . INGUINAL HERNIA REPAIR  yrs ago   left  . PROSTATE SURGERY  yrs ago    Current Outpatient Prescriptions  Medication Sig Dispense Refill  . albuterol (PROVENTIL) (2.5 MG/3ML) 0.083% nebulizer solution Take 3 mLs (2.5 mg total) by nebulization every 6 (six) hours as needed for wheezing or shortness of breath. 75 mL 12  . Amino Acids-Protein Hydrolys (FEEDING SUPPLEMENT, PRO-STAT SUGAR FREE 64,) LIQD Take 30 mLs by mouth 2 (two) times daily. 900 mL 0  . bisacodyl (DULCOLAX) 10 MG suppository Place 10 mg rectally daily as needed for moderate constipation.    . Cholecalciferol (VITAMIN D3) 50000 units CAPS Take 1 capsule by mouth 2 (two) times a week.    . docusate sodium (COLACE) 100 MG capsule Take 100 mg by mouth 2 (two) times daily.    . famotidine (PEPCID) 10 MG tablet Take 1 tablet (10 mg total) by mouth daily. 30 tablet 0  . finasteride (PROSCAR) 5 MG tablet Take 5 mg by mouth daily with breakfast.     . glucosamine-chondroitin 500-400 MG tablet Take 1 tablet by mouth every morning.    . Hypromellose (ARTIFICIAL TEARS OP) Apply to eye as needed.    . Hypromellose (GENTEAL MILD  TO MODERATE OP) Place 1 drop into both eyes 2 (two) times daily.    Marland Kitchen ibrutinib (IMBRUVICA) 140 MG capsul Take 420 mg by mouth every morning.    . mirabegron ER (MYRBETRIQ) 50 MG TB24 tablet Take 50 mg by mouth daily.    . Multiple Vitamins-Minerals (ICAPS AREDS 2) CAPS Take 1 capsule by mouth 2 (two) times daily.    Marland Kitchen oxybutynin (DITROPAN) 5 MG tablet Take 5 mg by mouth every 12 (twelve) hours as needed for bladder spasms.    . pantoprazole (PROTONIX) 40 MG tablet Take 40 mg by mouth daily.    . phenazopyridine (PYRIDIUM) 100 MG tablet Take 100 mg by mouth 3 (three) times daily as needed  (bladder spasms).    . polyethylene glycol (MIRALAX / GLYCOLAX) packet Take 17 g by mouth daily as needed for mild constipation or moderate constipation. 14 each 0  . pseudoephedrine-guaifenesin (MUCINEX D) 60-600 MG 12 hr tablet Take 1 tablet by mouth 2 (two) times daily as needed for congestion (cough).    . Sodium Phosphates (FLEET ENEMA RE) Place rectally as needed.     No current facility-administered medications for this visit.     Allergies as of 10/01/2016  . (No Known Allergies)    Family History  Problem Relation Age of Onset  . Tuberculosis Mother   . Blindness Father     Social History   Social History  . Marital status: Widowed    Spouse name: N/A  . Number of children: N/A  . Years of education: N/A   Occupational History  . retired Retired   Social History Main Topics  . Smoking status: Former Smoker    Packs/day: 0.25    Years: 3.00    Types: Pipe    Quit date: 12/10/1959  . Smokeless tobacco: Never Used     Comment: Pt states that he never inhaled. Pt also states that while in service he smoked cigs x 2 months.   . Alcohol use 0.6 oz/week    1 Glasses of wine per week     Comment: occasional  . Drug use: No  . Sexual activity: No   Other Topics Concern  . Not on file   Social History Narrative  . No narrative on file     Physical Exam: BP 92/60 (BP Location: Left Arm, Patient Position: Sitting, Cuff Size: Normal)   Pulse 92   Ht 5\' 5"  (1.651 m)   Wt 123 lb (55.8 kg)   BMI 20.47 kg/m  ConstitutionalVery frail elderly man sitting in a wheelchair ic: alert and oriented x3 Abdomen: soft, nontender, nondistended, no obvious ascites, no peritoneal signs, normal bowel sounds No peripheral edema noted in lower extremities  Assessment and plan: 82 y.o. male with weight loss, presumed achalasia  he is 89, he is very frail, he sits in a wheelchair most days. I do not think invasive procedures are wise for him at this point. He tells me that as  long as he chews his food very very well goes down fine. He also drinks one nutritious supplement drink once daily. I think probably the best route appears to simply modify his diet to a soft diet so that it is not the focus on chewing quite so long with every meal. I think would also be good if he drinks 2 nutritious drink supplements daily and set up 1. We will see to it that his nursing home gets these recommendations   Owens Loffler, MD [facility] 10/01/2016, 2:12 PM

## 2016-10-01 NOTE — Patient Instructions (Signed)
Soft diet at his nursing home.  I think this would be easier for him than regular solid food now. He should be offered 1 can of boost or ensure twice daily instead of once daily.

## 2016-10-16 ENCOUNTER — Telehealth: Payer: Self-pay | Admitting: *Deleted

## 2016-10-16 NOTE — Telephone Encounter (Signed)
I can probably see him next Tuesday either at 1130 am or 2 pm Would need labs prior to appt

## 2016-10-16 NOTE — Telephone Encounter (Signed)
LVM for Scheduler at [facility] to inform of appts available next Tuesday.  Please return Nurse's call.

## 2016-10-16 NOTE — Telephone Encounter (Signed)
Call from [place], at [facility].  She was asked to make f/u appt for pt w/ Dr. Alvy Bimler.   Call back phone [phone].

## 2016-10-18 ENCOUNTER — Emergency Department (HOSPITAL_COMMUNITY): Payer: Medicare Other

## 2016-10-18 ENCOUNTER — Ambulatory Visit (INDEPENDENT_AMBULATORY_CARE_PROVIDER_SITE_OTHER): Payer: Medicare Other | Admitting: Adult Health

## 2016-10-18 ENCOUNTER — Observation Stay (HOSPITAL_COMMUNITY): Payer: Medicare Other

## 2016-10-18 ENCOUNTER — Telehealth: Payer: Self-pay | Admitting: *Deleted

## 2016-10-18 ENCOUNTER — Inpatient Hospital Stay (HOSPITAL_COMMUNITY)
Admission: EM | Admit: 2016-10-18 | Discharge: 2016-10-24 | DRG: 871 | Disposition: A | Discharge status: Skilled Nursing Facility | Payer: Medicare Other | Attending: Internal Medicine | Admitting: Internal Medicine

## 2016-10-18 ENCOUNTER — Encounter: Payer: Self-pay | Admitting: Adult Health

## 2016-10-18 ENCOUNTER — Encounter (HOSPITAL_COMMUNITY): Payer: Self-pay | Admitting: *Deleted

## 2016-10-18 VITALS — BP 108/60 | HR 82 | Temp 96.8°F | Ht 65.0 in | Wt 119.0 lb

## 2016-10-18 DIAGNOSIS — W19XXXA Unspecified fall, initial encounter: Secondary | ICD-10-CM | POA: Diagnosis present

## 2016-10-18 DIAGNOSIS — J479 Bronchiectasis, uncomplicated: Secondary | ICD-10-CM

## 2016-10-18 DIAGNOSIS — K219 Gastro-esophageal reflux disease without esophagitis: Secondary | ICD-10-CM | POA: Diagnosis not present

## 2016-10-18 DIAGNOSIS — Z821 Family history of blindness and visual loss: Secondary | ICD-10-CM | POA: Diagnosis not present

## 2016-10-18 DIAGNOSIS — H547 Unspecified visual loss: Secondary | ICD-10-CM | POA: Diagnosis present

## 2016-10-18 DIAGNOSIS — N179 Acute kidney failure, unspecified: Secondary | ICD-10-CM | POA: Diagnosis not present

## 2016-10-18 DIAGNOSIS — Z23 Encounter for immunization: Secondary | ICD-10-CM | POA: Diagnosis not present

## 2016-10-18 DIAGNOSIS — D61818 Other pancytopenia: Secondary | ICD-10-CM | POA: Diagnosis present

## 2016-10-18 DIAGNOSIS — Q7649 Other congenital malformations of spine, not associated with scoliosis: Secondary | ICD-10-CM

## 2016-10-18 DIAGNOSIS — S0003XA Contusion of scalp, initial encounter: Secondary | ICD-10-CM | POA: Diagnosis present

## 2016-10-18 DIAGNOSIS — N3000 Acute cystitis without hematuria: Secondary | ICD-10-CM

## 2016-10-18 DIAGNOSIS — Z66 Do not resuscitate: Secondary | ICD-10-CM

## 2016-10-18 DIAGNOSIS — N400 Enlarged prostate without lower urinary tract symptoms: Secondary | ICD-10-CM | POA: Diagnosis present

## 2016-10-18 DIAGNOSIS — Z515 Encounter for palliative care: Secondary | ICD-10-CM

## 2016-10-18 DIAGNOSIS — R55 Syncope and collapse: Secondary | ICD-10-CM | POA: Diagnosis present

## 2016-10-18 DIAGNOSIS — A419 Sepsis, unspecified organism: Secondary | ICD-10-CM | POA: Diagnosis present

## 2016-10-18 DIAGNOSIS — C8590 Non-Hodgkin lymphoma, unspecified, unspecified site: Secondary | ICD-10-CM | POA: Diagnosis present

## 2016-10-18 DIAGNOSIS — N401 Enlarged prostate with lower urinary tract symptoms: Secondary | ICD-10-CM | POA: Diagnosis present

## 2016-10-18 DIAGNOSIS — J690 Pneumonitis due to inhalation of food and vomit: Secondary | ICD-10-CM | POA: Diagnosis not present

## 2016-10-18 DIAGNOSIS — N183 Chronic kidney disease, stage 3 unspecified: Secondary | ICD-10-CM | POA: Diagnosis present

## 2016-10-18 DIAGNOSIS — E860 Dehydration: Secondary | ICD-10-CM | POA: Diagnosis present

## 2016-10-18 DIAGNOSIS — F0390 Unspecified dementia without behavioral disturbance: Secondary | ICD-10-CM | POA: Diagnosis present

## 2016-10-18 DIAGNOSIS — N189 Chronic kidney disease, unspecified: Secondary | ICD-10-CM

## 2016-10-18 DIAGNOSIS — R059 Cough, unspecified: Secondary | ICD-10-CM

## 2016-10-18 DIAGNOSIS — Z9049 Acquired absence of other specified parts of digestive tract: Secondary | ICD-10-CM | POA: Diagnosis not present

## 2016-10-18 DIAGNOSIS — Y92121 Bathroom in nursing home as the place of occurrence of the external cause: Secondary | ICD-10-CM

## 2016-10-18 DIAGNOSIS — S0181XA Laceration without foreign body of other part of head, initial encounter: Secondary | ICD-10-CM | POA: Diagnosis present

## 2016-10-18 DIAGNOSIS — Z9181 History of falling: Secondary | ICD-10-CM

## 2016-10-18 DIAGNOSIS — K220 Achalasia of cardia: Secondary | ICD-10-CM | POA: Diagnosis not present

## 2016-10-18 DIAGNOSIS — E43 Unspecified severe protein-calorie malnutrition: Secondary | ICD-10-CM | POA: Diagnosis present

## 2016-10-18 DIAGNOSIS — J471 Bronchiectasis with (acute) exacerbation: Secondary | ICD-10-CM | POA: Diagnosis present

## 2016-10-18 DIAGNOSIS — K210 Gastro-esophageal reflux disease with esophagitis: Secondary | ICD-10-CM | POA: Diagnosis present

## 2016-10-18 DIAGNOSIS — S81012A Laceration without foreign body, left knee, initial encounter: Secondary | ICD-10-CM | POA: Diagnosis present

## 2016-10-18 DIAGNOSIS — Z79899 Other long term (current) drug therapy: Secondary | ICD-10-CM

## 2016-10-18 DIAGNOSIS — D630 Anemia in neoplastic disease: Secondary | ICD-10-CM | POA: Diagnosis not present

## 2016-10-18 DIAGNOSIS — Z87442 Personal history of urinary calculi: Secondary | ICD-10-CM | POA: Diagnosis not present

## 2016-10-18 DIAGNOSIS — N1330 Unspecified hydronephrosis: Secondary | ICD-10-CM | POA: Diagnosis present

## 2016-10-18 DIAGNOSIS — R911 Solitary pulmonary nodule: Secondary | ICD-10-CM

## 2016-10-18 DIAGNOSIS — I5032 Chronic diastolic (congestive) heart failure: Secondary | ICD-10-CM | POA: Diagnosis present

## 2016-10-18 DIAGNOSIS — I951 Orthostatic hypotension: Secondary | ICD-10-CM | POA: Diagnosis present

## 2016-10-18 DIAGNOSIS — R627 Adult failure to thrive: Secondary | ICD-10-CM

## 2016-10-18 DIAGNOSIS — W010XXA Fall on same level from slipping, tripping and stumbling without subsequent striking against object, initial encounter: Secondary | ICD-10-CM | POA: Diagnosis present

## 2016-10-18 DIAGNOSIS — Z87891 Personal history of nicotine dependence: Secondary | ICD-10-CM

## 2016-10-18 DIAGNOSIS — H9190 Unspecified hearing loss, unspecified ear: Secondary | ICD-10-CM | POA: Diagnosis present

## 2016-10-18 DIAGNOSIS — Z9889 Other specified postprocedural states: Secondary | ICD-10-CM | POA: Diagnosis not present

## 2016-10-18 DIAGNOSIS — Z7189 Other specified counseling: Secondary | ICD-10-CM

## 2016-10-18 DIAGNOSIS — C8300 Small cell B-cell lymphoma, unspecified site: Secondary | ICD-10-CM | POA: Diagnosis present

## 2016-10-18 DIAGNOSIS — Z6822 Body mass index (BMI) 22.0-22.9, adult: Secondary | ICD-10-CM

## 2016-10-18 DIAGNOSIS — G4730 Sleep apnea, unspecified: Secondary | ICD-10-CM | POA: Diagnosis present

## 2016-10-18 DIAGNOSIS — R05 Cough: Secondary | ICD-10-CM

## 2016-10-18 DIAGNOSIS — N342 Other urethritis: Secondary | ICD-10-CM

## 2016-10-18 DIAGNOSIS — M6281 Muscle weakness (generalized): Secondary | ICD-10-CM

## 2016-10-18 DIAGNOSIS — Z87440 Personal history of urinary (tract) infections: Secondary | ICD-10-CM

## 2016-10-18 DIAGNOSIS — D696 Thrombocytopenia, unspecified: Secondary | ICD-10-CM | POA: Diagnosis present

## 2016-10-18 DIAGNOSIS — N390 Urinary tract infection, site not specified: Secondary | ICD-10-CM | POA: Diagnosis present

## 2016-10-18 HISTORY — DX: Chronic kidney disease, stage 3 (moderate): N18.3

## 2016-10-18 HISTORY — DX: Chronic kidney disease, stage 3 unspecified: N18.30

## 2016-10-18 LAB — CBC WITH DIFFERENTIAL/PLATELET
BASOS PCT: 0 %
Basophils Absolute: 0 10*3/uL (ref 0.0–0.1)
EOS ABS: 0 10*3/uL (ref 0.0–0.7)
Eosinophils Relative: 0 %
HCT: 29 % — ABNORMAL LOW (ref 39.0–52.0)
Hemoglobin: 9 g/dL — ABNORMAL LOW (ref 13.0–17.0)
LYMPHS ABS: 1.7 10*3/uL (ref 0.7–4.0)
Lymphocytes Relative: 30 %
MCH: 25.8 pg — AB (ref 26.0–34.0)
MCHC: 31 g/dL (ref 30.0–36.0)
MCV: 83.1 fL (ref 78.0–100.0)
MONO ABS: 0.6 10*3/uL (ref 0.1–1.0)
Monocytes Relative: 10 %
NEUTROS ABS: 3.2 10*3/uL (ref 1.7–7.7)
NEUTROS PCT: 60 %
PLATELETS: 81 10*3/uL — AB (ref 150–400)
RBC: 3.49 MIL/uL — ABNORMAL LOW (ref 4.22–5.81)
RDW: 17 % — AB (ref 11.5–15.5)
WBC: 5.5 10*3/uL (ref 4.0–10.5)

## 2016-10-18 LAB — URINALYSIS, ROUTINE W REFLEX MICROSCOPIC
Bilirubin Urine: NEGATIVE
GLUCOSE, UA: NEGATIVE mg/dL
KETONES UR: NEGATIVE mg/dL
NITRITE: NEGATIVE
PH: 6 (ref 5.0–8.0)
Protein, ur: 100 mg/dL — AB
Specific Gravity, Urine: 1.014 (ref 1.005–1.030)

## 2016-10-18 LAB — COMPREHENSIVE METABOLIC PANEL
ALT: 16 U/L — ABNORMAL LOW (ref 17–63)
ANION GAP: 9 (ref 5–15)
AST: 22 U/L (ref 15–41)
Albumin: 2.4 g/dL — ABNORMAL LOW (ref 3.5–5.0)
Alkaline Phosphatase: 137 U/L — ABNORMAL HIGH (ref 38–126)
BUN: 55 mg/dL — ABNORMAL HIGH (ref 6–20)
CHLORIDE: 102 mmol/L (ref 101–111)
CO2: 23 mmol/L (ref 22–32)
Calcium: 9.4 mg/dL (ref 8.9–10.3)
Creatinine, Ser: 2.37 mg/dL — ABNORMAL HIGH (ref 0.61–1.24)
GFR, EST AFRICAN AMERICAN: 25 mL/min — AB (ref >60)
GFR, EST NON AFRICAN AMERICAN: 22 mL/min — AB (ref >60)
Glucose, Bld: 96 mg/dL (ref 65–99)
POTASSIUM: 4.4 mmol/L (ref 3.5–5.1)
Sodium: 134 mmol/L — ABNORMAL LOW (ref 135–145)
TOTAL PROTEIN: 4.3 g/dL — AB (ref 6.5–8.1)
Total Bilirubin: 1.1 mg/dL (ref 0.3–1.2)

## 2016-10-18 LAB — URINE MICROSCOPIC-ADD ON

## 2016-10-18 LAB — BRAIN NATRIURETIC PEPTIDE: B NATRIURETIC PEPTIDE 5: 47.3 pg/mL (ref 0.0–100.0)

## 2016-10-18 LAB — I-STAT TROPONIN, ED: TROPONIN I, POC: 0.01 ng/mL (ref 0.00–0.08)

## 2016-10-18 MED ORDER — PIPERACILLIN-TAZOBACTAM IN DEX 2-0.25 GM/50ML IV SOLN
2.2500 g | Freq: Four times a day (QID) | INTRAVENOUS | Status: DC
  Administered 2016-10-19 – 2016-10-22 (×13): 2.25 g via INTRAVENOUS
  Filled 2016-10-18 (×16): qty 50

## 2016-10-18 MED ORDER — PIPERACILLIN-TAZOBACTAM IN DEX 2-0.25 GM/50ML IV SOLN
2.2500 g | Freq: Once | INTRAVENOUS | Status: AC
  Administered 2016-10-18: 2.25 g via INTRAVENOUS
  Filled 2016-10-18: qty 50

## 2016-10-18 MED ORDER — POLYETHYLENE GLYCOL 3350 17 G PO PACK: 17.0000 g | PACK | Freq: Every day | ORAL | Status: DC | PRN

## 2016-10-18 MED ORDER — ONDANSETRON HCL 4 MG/2ML IJ SOLN: 4.0000 mg | Freq: Four times a day (QID) | INTRAMUSCULAR | Status: DC | PRN

## 2016-10-18 MED ORDER — IBRUTINIB 140 MG PO CAPS
420.0000 mg | ORAL_CAPSULE | Freq: Every morning | ORAL | Status: DC
  Administered 2016-10-20 – 2016-10-21 (×2): 420 mg via ORAL
  Filled 2016-10-18 (×2): qty 3

## 2016-10-18 MED ORDER — ACETAMINOPHEN 325 MG PO TABS: 650.0000 mg | ORAL_TABLET | Freq: Four times a day (QID) | ORAL | Status: DC | PRN

## 2016-10-18 MED ORDER — FLEET ENEMA 7-19 GM/118ML RE ENEM: 1.0000 | ENEMA | RECTAL | Status: DC | PRN

## 2016-10-18 MED ORDER — ARTIFICIAL TEARS OP OINT
TOPICAL_OINTMENT | Freq: Every evening | OPHTHALMIC | Status: DC | PRN
Start: 2016-10-18 — End: 2016-10-25

## 2016-10-18 MED ORDER — PRO-STAT SUGAR FREE PO LIQD
30.0000 mL | Freq: Two times a day (BID) | ORAL | Status: DC
  Administered 2016-10-19 – 2016-10-24 (×7): 30 mL via ORAL
  Filled 2016-10-18 (×8): qty 30

## 2016-10-18 MED ORDER — ACETAMINOPHEN 650 MG RE SUPP: 650.0000 mg | Freq: Four times a day (QID) | RECTAL | Status: DC | PRN

## 2016-10-18 MED ORDER — PANTOPRAZOLE SODIUM 40 MG PO TBEC
40.0000 mg | DELAYED_RELEASE_TABLET | Freq: Every day | ORAL | Status: DC
  Administered 2016-10-19 – 2016-10-24 (×6): 40 mg via ORAL
  Filled 2016-10-18 (×6): qty 1

## 2016-10-18 MED ORDER — POLYVINYL ALCOHOL 1.4 % OP SOLN
1.0000 [drp] | Freq: Two times a day (BID) | OPHTHALMIC | Status: DC
  Administered 2016-10-18 – 2016-10-24 (×12): 1 [drp] via OPHTHALMIC
  Filled 2016-10-18: qty 15

## 2016-10-18 MED ORDER — GLUCOSAMINE-CHONDROITIN 500-400 MG PO TABS: 1.0000 | ORAL_TABLET | Freq: Every morning | ORAL | Status: DC

## 2016-10-18 MED ORDER — FAMOTIDINE 20 MG PO TABS
10.0000 mg | ORAL_TABLET | Freq: Every day | ORAL | Status: DC
  Administered 2016-10-18: 10 mg via ORAL
  Filled 2016-10-18 (×2): qty 1

## 2016-10-18 MED ORDER — PHENAZOPYRIDINE HCL 100 MG PO TABS: 100.0000 mg | ORAL_TABLET | Freq: Three times a day (TID) | ORAL | Status: DC | PRN

## 2016-10-18 MED ORDER — ZOLPIDEM TARTRATE 5 MG PO TABS: 5.0000 mg | ORAL_TABLET | Freq: Every evening | ORAL | Status: DC | PRN

## 2016-10-18 MED ORDER — DEXTROSE 5 % IV SOLN: 1.0000 g | Freq: Once | INTRAVENOUS | Status: DC

## 2016-10-18 MED ORDER — PROSIGHT PO TABS
1.0000 | ORAL_TABLET | Freq: Two times a day (BID) | ORAL | Status: DC
  Administered 2016-10-18 – 2016-10-24 (×12): 1 via ORAL
  Filled 2016-10-18 (×12): qty 1

## 2016-10-18 MED ORDER — DOCUSATE SODIUM 100 MG PO CAPS
100.0000 mg | ORAL_CAPSULE | Freq: Two times a day (BID) | ORAL | Status: DC
  Administered 2016-10-18 – 2016-10-24 (×12): 100 mg via ORAL
  Filled 2016-10-18 (×12): qty 1

## 2016-10-18 MED ORDER — VITAMIN D (ERGOCALCIFEROL) 1.25 MG (50000 UNIT) PO CAPS
50000.0000 [IU] | ORAL_CAPSULE | ORAL | Status: DC
  Administered 2016-10-21 – 2016-10-24 (×2): 50000 [IU] via ORAL
  Filled 2016-10-18 (×2): qty 1

## 2016-10-18 MED ORDER — SODIUM CHLORIDE 0.9 % IV BOLUS (SEPSIS)
500.0000 mL | Freq: Once | INTRAVENOUS | Status: AC
  Administered 2016-10-18: 500 mL via INTRAVENOUS

## 2016-10-18 MED ORDER — ALBUTEROL SULFATE (2.5 MG/3ML) 0.083% IN NEBU: 2.5000 mg | INHALATION_SOLUTION | RESPIRATORY_TRACT | Status: DC | PRN

## 2016-10-18 MED ORDER — MIRABEGRON ER 25 MG PO TB24
50.0000 mg | ORAL_TABLET | Freq: Every day | ORAL | Status: DC
  Administered 2016-10-19 – 2016-10-24 (×6): 50 mg via ORAL
  Filled 2016-10-18 (×5): qty 2
  Filled 2016-10-18: qty 1
  Filled 2016-10-18: qty 2

## 2016-10-18 MED ORDER — ONDANSETRON HCL 4 MG PO TABS: 4.0000 mg | ORAL_TABLET | Freq: Four times a day (QID) | ORAL | Status: DC | PRN

## 2016-10-18 MED ORDER — OXYBUTYNIN CHLORIDE 5 MG PO TABS: 5.0000 mg | ORAL_TABLET | Freq: Two times a day (BID) | ORAL | Status: DC | PRN

## 2016-10-18 MED ORDER — BISACODYL 10 MG RE SUPP
10.0000 mg | Freq: Every day | RECTAL | Status: DC | PRN
Start: 2016-10-18 — End: 2016-10-25

## 2016-10-18 MED ORDER — SODIUM CHLORIDE 0.9 % IV SOLN
INTRAVENOUS | Status: DC
  Administered 2016-10-18: 22:00:00 via INTRAVENOUS

## 2016-10-18 MED ORDER — FINASTERIDE 5 MG PO TABS
5.0000 mg | ORAL_TABLET | Freq: Every day | ORAL | Status: DC
  Administered 2016-10-19 – 2016-10-23 (×5): 5 mg via ORAL
  Filled 2016-10-18 (×6): qty 1

## 2016-10-18 MED ORDER — PSEUDOEPHEDRINE-GUAIFENESIN ER 60-600 MG PO TB12: 1.0000 | ORAL_TABLET | Freq: Two times a day (BID) | ORAL | Status: DC | PRN

## 2016-10-18 MED ORDER — OCUVITE-LUTEIN PO CAPS
1.0000 | ORAL_CAPSULE | Freq: Two times a day (BID) | ORAL | Status: DC
  Filled 2016-10-18 (×2): qty 1

## 2016-10-18 MED ORDER — SODIUM CHLORIDE 0.9% FLUSH
3.0000 mL | Freq: Two times a day (BID) | INTRAVENOUS | Status: DC
  Administered 2016-10-18 – 2016-10-24 (×6): 3 mL via INTRAVENOUS

## 2016-10-18 MED ORDER — TETANUS-DIPHTH-ACELL PERTUSSIS 5-2.5-18.5 LF-MCG/0.5 IM SUSP
0.5000 mL | Freq: Once | INTRAMUSCULAR | Status: AC
Start: 2016-10-18 — End: 2016-10-18
  Administered 2016-10-18: 0.5 mL via INTRAMUSCULAR
  Filled 2016-10-18: qty 0.5

## 2016-10-18 NOTE — Patient Instructions (Addendum)
We are setting you up for a CT chest .  We need to control your reflux to prevent aspiration that can aggravate your lungs Change Pepcid to 20 mg at bedtime Continue on Protonix 40 mg daily before meal Begin Flonase 2 puffs daily Stop using Mucinex D Begin Mucinex 600 mg twice daily Begin  Delsym 2 teaspoons twice daily Check sputum culture .  Follow-up in 3 weeks with Dr. Vaughan Browner .  Please contact office for sooner follow up if symptoms do not improve or worsen or seek emergency care

## 2016-10-18 NOTE — ED Notes (Signed)
Urine collected from leg bag per Dr. Darl Householder. Patient has a chronic indwelling calender but it does not have a sterile collection port.

## 2016-10-18 NOTE — ED Notes (Signed)
Notified floor RN that we are awaiting Zosyn from the pharmacy.

## 2016-10-18 NOTE — ED Provider Notes (Signed)
[facility] DEPT Provider Note   CSN: [id number] Arrival date & time: 10/18/16  1612     History   Chief Complaint Chief Complaint  Patient presents with  . Fall  . Head Injury    HPI Daniel [name] is a 82 y.o. male hx of anemia, diverticulosis, non hodgkin's lymphoma on oral chemo, Recurrent UTI here presenting with possible syncope. Patient resides in the nursing home and is not eating very much at baseline. Patient states that he had a bowel movement and then became very dizzy when he stood up and then may have passed out and fell face forward. He was noted to have a laceration about the right eye and some skin tears of the left knee. He has fell several months ago and was found to have acute renal failure associated with recurrent UTI. He has an indwelling Foley that has been changed recently by urology.  The history is provided by the patient and the EMS personnel.   Level V caveat- dementia    Past Medical History:  Diagnosis Date  . Anemia   . B12 deficiency 06/06/2016  . BPH (benign prostatic hyperplasia)   . Congenital malformation of spine    T12  . Cough for last 2 years   occasional white sputum  . Diverticulosis   . GERD with esophagitis   . History of kidney stones 2013  . HOH (hard of hearing)   . Hx of hydronephrosis   . Hydronephrosis 10/27/2014  . NHL (non-Hodgkin's lymphoma) ([place])    nhl dx 5/09;  . Pneumonia   . Prostate enlargement   . Sleep apnea   . 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 AKI (acute kidney injury) ([place]) 07/28/2016  . Hypotension 07/28/2016  . Chronic diastolic CHF (congestive heart failure) ([place]) 07/28/2016  . Facial laceration   . UTI (lower urinary tract infection)   . UTI (urinary tract infection) 06/15/2016  . Acute renal failure ([place]) 06/14/2016  . Acute renal failure (ARF) ([place]) 06/14/2016  . Bilateral leg edema 06/07/2016  . Pancytopenia, acquired ([place])  06/06/2016  . B12 deficiency 06/06/2016  . Acute respiratory failure ([place]) 03/19/2016  . Protein-calorie malnutrition, severe 03/15/2016  . Hypoxia 03/14/2016  . Lesion of left lung 03/07/2016  . Protein-calorie malnutrition, moderate ([place]) 03/07/2016  . Thrombocytopenia ([place]) 06/01/2015  . Hernia, inguinal, right 06/01/2015  . Essential hypertension 06/01/2015  . Chronic kidney disease (CKD), stage III (moderate) 01/10/2015  . 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 Preventive measure 11/14/2014  . Hydronephrosis, right 10/27/2014  . Cellulitis of arm, right 02/11/2014  . Bronchiectasis without acute exacerbation ([place]) 06/09/2013  . Hydroureteronephrosis 05/12/2012  . Lymphoma, small lymphocytic ([place]) 11/10/2011  . 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 CHRONIC AIRWAY OBSTRUCTION NEC 09/11/2009    Past Surgical History:  Procedure Laterality Date  . BOTOX INJECTION N/A 07/15/2013   Procedure: BOTOX INJECTION;  Surgeon: Milus Banister, MD;  Location: [facility] ENDOSCOPY;  Service: Endoscopy;  Laterality: N/A;  . CHOLECYSTECTOMY  yrs ago  . ESOPHAGOGASTRODUODENOSCOPY (EGD) WITH PROPOFOL N/A 07/15/2013   Procedure: ESOPHAGOGASTRODUODENOSCOPY (EGD) WITH PROPOFOL;  Surgeon: Milus Banister, MD;  Location: [facility];  Service: Endoscopy;  Laterality: N/A;  botox injection  . EYE SURGERY  yrs ago   Implants bilateral  . INGUINAL HERNIA REPAIR  yrs ago   left  . PROSTATE SURGERY  yrs ago       Home  Medications    Prior to Admission medications   Medication Sig Start Date End Date Taking? Authorizing Provider  albuterol (PROVENTIL) (2.5 MG/3ML) 0.083% nebulizer solution Take 3 mLs (2.5 mg total) by nebulization every 6 (six) hours as needed for wheezing or shortness of breath. 03/19/16   [name], MD  Amino Acids-Protein Hydrolys (FEEDING SUPPLEMENT, PRO-STAT SUGAR FREE 64,) LIQD Take 30 mLs by mouth 2 (two) times daily. 08/02/16   [name], MD  benzonatate (TESSALON) 100 MG capsule Take by mouth 3  (three) times daily as needed for cough.    Historical Provider, MD  bisacodyl (DULCOLAX) 10 MG suppository Place 10 mg rectally daily as needed for moderate constipation.    Historical Provider, MD  Cholecalciferol (VITAMIN D3) 50000 units CAPS Take 1 capsule by mouth 2 (two) times a week.    Historical Provider, MD  docusate sodium (COLACE) 100 MG capsule Take 100 mg by mouth 2 (two) times daily.    Historical Provider, MD  famotidine (PEPCID) 10 MG tablet Take 1 tablet (10 mg total) by mouth daily. 06/17/16   [name], MD  finasteride (PROSCAR) 5 MG tablet Take 5 mg by mouth daily with breakfast.     Historical Provider, MD  glucosamine-chondroitin 500-400 MG tablet Take 1 tablet by mouth every morning.    Historical Provider, MD  Hypromellose (ARTIFICIAL TEARS OP) Apply to eye as needed.    Historical Provider, MD  Hypromellose (GENTEAL MILD TO MODERATE OP) Place 1 drop into both eyes 2 (two) times daily.    Historical Provider, MD  ibrutinib (IMBRUVICA) 140 MG capsul Take 420 mg by mouth every morning.    Historical Provider, MD  mirabegron ER (MYRBETRIQ) 50 MG TB24 tablet Take 50 mg by mouth daily.    Historical Provider, MD  Multiple Vitamins-Minerals (ICAPS AREDS 2) CAPS Take 1 capsule by mouth 2 (two) times daily.    Historical Provider, MD  oxybutynin (DITROPAN) 5 MG tablet Take 5 mg by mouth every 12 (twelve) hours as needed for bladder spasms.    Historical Provider, MD  pantoprazole (PROTONIX) 40 MG tablet Take 40 mg by mouth daily.    Historical Provider, MD  phenazopyridine (PYRIDIUM) 100 MG tablet Take 100 mg by mouth 3 (three) times daily as needed (bladder spasms).    Historical Provider, MD  polyethylene glycol (MIRALAX / GLYCOLAX) packet Take 17 g by mouth daily as needed for mild constipation or moderate constipation. 08/02/16   [name], MD  pseudoephedrine-guaifenesin (MUCINEX D) 60-600 MG 12 hr tablet Take 1 tablet by mouth 2 (two) times daily as needed for congestion (cough).     Historical Provider, MD  Sodium Phosphates (FLEET ENEMA RE) Place rectally as needed.    Historical Provider, MD    Family History Family History  Problem Relation Age of Onset  . Tuberculosis Mother   . Blindness Father     Social History Social History  Substance Use Topics  . Smoking status: Former Smoker    Packs/day: 0.25    Years: 3.00    Types: Pipe    Quit date: 12/10/1959  . Smokeless tobacco: Never Used     Comment: Pt states that he never inhaled. Pt also states that while in service he smoked cigs x 2 months.   . Alcohol use 0.6 oz/week    1 Glasses of wine per week     Comment: occasional     Allergies   Patient has no known allergies.   Review  of Systems Review of Systems  Constitutional: Positive for appetite change.  Skin: Positive for wound.  All other systems reviewed and are negative.    Physical Exam Updated Vital Signs Ht 5\' 5"  (1.651 m)   Wt 119 lb (54 kg)   BMI 19.80 kg/m   Physical Exam  Constitutional:  Chronically ill, dehydrated   HENT:  Head: Normocephalic.  MM slightly dry. 2 cm laceration above R eyebrow with skin tear. R mid face ecchymosis.   Eyes: EOM are normal. Pupils are equal, round, and reactive to light.  Neck: Normal range of motion. Neck supple.  Cardiovascular: Normal rate, regular rhythm and normal heart sounds.   Pulmonary/Chest: Effort normal and breath sounds normal. No respiratory distress. He has no wheezes.  Abdominal: Soft. Bowel sounds are normal. He exhibits no distension. There is no tenderness. There is no guarding.  Genitourinary:  Genitourinary Comments: Indwelling foley in place   Musculoskeletal: Normal range of motion.  Neurological: He is alert.  Skin: Skin is warm.  Multiple skin tears on L knee, no active bleeding   Psychiatric: He has a normal mood and affect.  Nursing note and vitals reviewed.    ED Treatments / Results  Labs (all labs ordered are listed, but only abnormal results  are displayed) Labs Reviewed  CBC WITH DIFFERENTIAL/PLATELET  COMPREHENSIVE METABOLIC PANEL  I-STAT [place], ED    EKG  EKG Interpretation  Date/Time:  Friday October 18 2016 16:22:04 EST Ventricular Rate:  86 PR Interval:    QRS Duration: 149 QT Interval:  374 QTC Calculation: 448 R Axis:   -159 Text Interpretation:  Sinus rhythm Right bundle branch block No significant change since last tracing Confirmed by Leolia Vinzant  MD, Shantelle Alles ([id number]) on 10/18/2016 4:32:59 PM       Radiology No results found.  Procedures Procedures (including critical care time)  LACERATION REPAIR Performed by: Wandra Arthurs Authorized by: Wandra Arthurs Consent: Verbal consent obtained. Risks and benefits: risks, benefits and alternatives were discussed Consent given by: patient Patient identity confirmed: provided demographic data Prepped and Draped in normal sterile fashion Wound explored  Laceration Location: R forehead laceration  Laceration Length: 2 cm  No Foreign Bodies seen or palpated  Anesthesia:  none Irrigation method: syringe Amount of cleaning: standard  Skin closure: dermabond   Patient tolerance: Patient tolerated the procedure well with no immediate complications.   Medications Ordered in ED Medications - No data to display   Initial Impression / Assessment and Plan / ED Course  I have reviewed the triage vital signs and the nursing notes.  Pertinent labs & imaging results that were available during my care of the patient were reviewed by me and considered in my medical decision making (see chart for details).  Clinical Course    HIROKI HYPPOLITE is a 82 y.o. male here with possible syncope after having BM. Chronically ill appearing. Hx of thrombocytopenia and recurrent UTI so will check labs, UA. Will hydrate.   7:11 PM WBC nl. Cr 2.4, baseline around 1.2. xrays and CT head unremarkable. UA + UTI, previous culture showed pseudomonas. Given rocephin and IVF. Will admit for  syncope from renal failure, UTI.    Final Clinical Impressions(s) / ED Diagnoses   Final diagnoses:  None    New Prescriptions New Prescriptions   No medications on file     Drenda Freeze, MD 10/18/16 1912

## 2016-10-18 NOTE — Progress Notes (Signed)
Subjective:    Patient ID: Daniel [name], male    DOB: 10/08/1919, 82 y.o.   MRN: [id number]  HPI 82 year old male former smoker for mild bronchiectasis with chronic cough . Former patient of Dr. Gwenette Greet .  He has Non Hodgkins lymphoma followed by oncology /hem. Chronic anemia/pancytopenia  Hx of chronic esophagitis w/ reflux . CKD stage 3 , Malnutrition   TEST  Sputum AFB neg 2014  Sputum cx + serratia  CT chest 2010:  Mild lower lobe bronchiectasis CT chest 05/2013:  Worsening LL bronchiectasis with peribronchial nodularity noted.  ?aspiration ongoing? PFT's 2010:  Very mild airflow obstruction (no response to BD trial).    10/18/2016 Acute OV : Cough  Pt presents for an acute office visit. Pt complains that over last 3 month he has a cough that will not go away .he has been treated several times over the 6 months for bronchitis , pneumonia with antibiotics. He has tessalon but cough will not go away . It is productive but has a hard time to get up mucus because it is thick and creamy. Denies fever, chest pain, orthopnea or hemoptysis . Has chronic edema but not increased.  Previous patient of Dr. Gwenette Greet , seen last 2014 . He was followed for mild bronchiectasis . Workup neg sputum for AFB .  Weight loss of 20lbs over last 2 years w/  poor appetite . Has coughing with eating more . Does have reflux despite protoinix and pepcid.  Says he quit >45 yr ago. He was a Building control surveyor.  Was independent up until 6 months ago. Had fall then , things has went down hill since then.  He is followed for lymphoma with Oncology ,currently on ibrutinib.  Previous PET in March 2017 showed decreased left central mesenteric mass. + LLL nodule was hypermetabolic -this was felt to be inflammatory/infectious in nature from recurrent PNA ? Aspiration  He is followed by GI for EGD and achalasia w/ previous botox injection 2014. He is on soft diet for dysphagia.    Past Medical History:  Diagnosis Date    . Anemia   . B12 deficiency 06/06/2016  . BPH (benign prostatic hyperplasia)   . Congenital malformation of spine    T12  . Cough for last 2 years   occasional white sputum  . Diverticulosis   . GERD with esophagitis   . History of kidney stones 2013  . HOH (hard of hearing)   . Hx of hydronephrosis   . Hydronephrosis 10/27/2014  . NHL (non-Hodgkin's lymphoma) ([place])    nhl dx 5/09;  . Pneumonia   . Prostate enlargement   . Sleep apnea   . 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 albuterol (PROVENTIL) (2.5 MG/3ML) 0.083% nebulizer solution Take 3 mLs (2.5 mg total) by nebulization every 6 (six) hours as needed for wheezing or shortness of breath. 75 mL 12  . Amino Acids-Protein Hydrolys (FEEDING SUPPLEMENT, PRO-STAT SUGAR FREE 64,) LIQD Take 30 mLs by mouth 2 (two) times daily. 900 mL 0  . bisacodyl (DULCOLAX) 10 MG suppository Place 10 mg rectally daily as needed for moderate constipation.    . Cholecalciferol (VITAMIN D3) 50000 units CAPS Take 1 capsule by mouth 2 (two) times a week.    . docusate sodium (COLACE) 100 MG capsule Take 100 mg by mouth 2 (two) times daily.    . famotidine (PEPCID) 10  MG tablet Take 1 tablet (10 mg total) by mouth daily. 30 tablet 0  . finasteride (PROSCAR) 5 MG tablet Take 5 mg by mouth daily with breakfast.     . glucosamine-chondroitin 500-400 MG tablet Take 1 tablet by mouth every morning.    . Hypromellose (ARTIFICIAL TEARS OP) Apply to eye as needed.    . Hypromellose (GENTEAL MILD TO MODERATE OP) Place 1 drop into both eyes 2 (two) times daily.    Marland Kitchen ibrutinib (IMBRUVICA) 140 MG capsul Take 420 mg by mouth every morning.    . mirabegron ER (MYRBETRIQ) 50 MG TB24 tablet Take 50 mg by mouth daily.    . Multiple Vitamins-Minerals (ICAPS AREDS 2) CAPS Take 1 capsule by mouth 2 (two) times daily.    Marland Kitchen oxybutynin (DITROPAN) 5 MG tablet Take 5 mg by mouth every 12  (twelve) hours as needed for bladder spasms.    . pantoprazole (PROTONIX) 40 MG tablet Take 40 mg by mouth daily.    . phenazopyridine (PYRIDIUM) 100 MG tablet Take 100 mg by mouth 3 (three) times daily as needed (bladder spasms).    . polyethylene glycol (MIRALAX / GLYCOLAX) packet Take 17 g by mouth daily as needed for mild constipation or moderate constipation. 14 each 0  . pseudoephedrine-guaifenesin (MUCINEX D) 60-600 MG 12 hr tablet Take 1 tablet by mouth 2 (two) times daily as needed for congestion (cough).    . Sodium Phosphates (FLEET ENEMA RE) Place rectally as needed.     No current facility-administered medications on file prior to visit.      Review of Systems Constitutional:   +weight loss,  No night sweats,  Fevers, chills,  +fatigue, or  lassitude.  HEENT:   No headaches,  Difficulty swallowing,  Tooth/dental problems, or  Sore throat,                No sneezing, itching, ear ache, nasal congestion, post nasal drip,   CV:  No chest pain,  Orthopnea, PND, swelling in lower extremities, anasarca, dizziness, palpitations, syncope.   GI  No heartburn, indigestion, abdominal pain, nausea, vomiting, diarrhea, change in bowel habits, loss of appetite, bloody stools.   Resp:   No chest wall deformity  Skin: no rash or lesions.  GU: no dysuria, change in color of urine, no urgency or frequency.  No flank pain, no hematuria   MS:  No joint pain or swelling.  No decreased range of motion.    Psych:  No change in mood or affect. No depression or anxiety.  No memory loss.         Objective:   Physical Exam Vitals:   10/18/16 1021  BP: 108/60  Pulse: 82  Temp: (!) 96.8 F (36 C)  TempSrc: Oral  SpO2: 100%  Weight: 119 lb (54 kg)  Height: 5\' 5"  (1.651 m)   GEN: A/Ox3; pleasant , NAD, frail in wc    HEENT:  [place]/AT,  EACs-clear, TMs-wnl, NOSE-clear, THROAT-clear, no lesions, no postnasal drip or exudate noted.   NECK:  Supple w/ fair ROM; no JVD; normal carotid  impulses w/o bruits; no thyromegaly or nodules palpated; no lymphadenopathy.    RESP  diminished bs in bases , no accessory muscle use, no dullness to percussion  CARD:  RRR, no m/r/g  , tr -1+ peripheral edema, pulses intact, no cyanosis or clubbing.  GI:   Soft & nt; nml bowel sounds; no organomegaly or masses detected.   Musco: Warm bil, no deformities  or joint swelling noted.   Neuro: alert, no focal deficits noted.    Skin: Warm, few ecchymotic areas noted    CBC    Component Value Date/Time   WBC 3.2 (L) 08/01/2016 0618   RBC 3.02 (L) 08/01/2016 0618   HGB 7.9 (L) 08/01/2016 0618   HGB 12.8 (L) 06/06/2016 1531   HCT 25.6 (L) 08/01/2016 0618   HCT 40.5 06/06/2016 1531   PLT 91 (L) 08/01/2016 0618   PLT 146 06/06/2016 1531   MCV 84.8 08/01/2016 0618   MCV 88.0 06/06/2016 1531   MCH 26.2 08/01/2016 0618   MCHC 30.9 08/01/2016 0618   RDW 19.5 (H) 08/01/2016 0618   RDW 15.8 (H) 06/06/2016 1531   LYMPHSABS 1.5 07/28/2016 0645   LYMPHSABS 1.8 06/06/2016 1531   MONOABS 0.7 07/28/2016 0645   MONOABS 0.8 06/06/2016 1531   EOSABS 0.0 07/28/2016 0645   EOSABS 0.0 06/06/2016 1531   BASOSABS 0.0 07/28/2016 0645   BASOSABS 0.0 06/06/2016 1531    BMET    Component Value Date/Time   NA 138 08/01/2016 0618   NA 141 06/20/2016   NA 136 06/06/2016 1531   K 4.4 08/01/2016 0618   K 4.0 06/06/2016 1531   CL 113 (H) 08/01/2016 0618   CL 103 04/19/2013 0828   CO2 22 08/01/2016 0618   CO2 27 06/06/2016 1531   GLUCOSE 89 08/01/2016 0618   GLUCOSE 93 06/06/2016 1531   GLUCOSE 101 (H) 04/19/2013 0828   BUN 34 (H) 08/01/2016 0618   BUN 32 (A) 06/20/2016   BUN 42.1 (H) 06/06/2016 1531   CREATININE 1.28 (H) 08/01/2016 0618   CREATININE 2.0 (H) 06/06/2016 1531   CALCIUM 7.7 (L) 08/01/2016 0618   CALCIUM 9.4 06/06/2016 1531   GFRNONAA 46 (L) 08/01/2016 0618   GFRAA 53 (L) 08/01/2016 0618   Erythrocyte Sedimentation Rate     Component Value Date/Time   ESRSEDRATE 7 06/06/2016  1531     Cherrie Franca NP-C  [facility]  10/18/2016        Assessment & Plan:

## 2016-10-18 NOTE — Telephone Encounter (Signed)
[facility] left message stating patient needs a 3 month follow up appt with Dr Alvy Bimler. Is due now.

## 2016-10-18 NOTE — H&P (Addendum)
History and Physical    Daniel [name] [id number] DOB: 05/29/1919 DOA: 10/18/2016  Referring MD/NP/PA:   PCP: Wenda Low, MD   Patient coming from:  The patient is coming from [facility]  At baseline, pt is dependent for most of ADL.  Chief Complaint: fall  HPI: Daniel [name] is a 82 y.o. male with medical history significant of indwelling cath, NHL, small cell B-lymphoma, right-sided hydronephrosis, poor hearing wearing hearing aids, BPH, GERD, CKD-III, dCHF, mild bronchiectasis with chronic cough, who presents with fall.  Per report, pt fell in SNF. He reports to me and also to RN in ED that his fall happed after had bowel movement and slipped in bathroom. He denies to me that he had any prodromal symptoms, no unilateral weakness, vision change. He did not have LOC. EDP documented that pt had dizziness when he stoop up after having BM. Pt has skin laceration in right eye and left knee. He has bruieses in right shoulder and multiple other places in his body. He has hx of mild bronchiectasis with chronic cough, which has not changed. He has an indwelling Foley that has been changed recently by urology. He reports mild discomfort in suprapubic area. Patient denies chest pain, fever, chills. Nausea, vomiting or diarrhea. He has hx of chalasia and reports difficulty swallowing.  ED Course: pt was found to have  positive urinalysis with large amount of leukocyte, negative troponin, WBC 5.5, hemoglobin 9.0, platelets 81, no fever, no tachycardia, no tachypnea, saturation 96%, worsening renal function, negative chest x-ray for acute abnormalities. Patient had negative CT head for acute intracranial abnormalities. CT- of C-spine is negative for bony fracture. X-ray of left knee and pelvis are negative for bony abnormalities. CT maxillofacial is negative for bony abnormalities, but showed right supraorbital scalp hematoma. Pt is placed on tele bed for obs.  Review of Systems:   General: no fevers,  chills, no changes in body weight, has fatigue HEENT: no blurry vision, has sore throat. Has poor hearing. Respiratory: no dyspnea, has coughing, no wheezing CV: no chest pain, no palpitations GI: no nausea, vomiting, has suprapubic abdominal discomfort, diarrhea, constipation GU: no dysuria, burning on urination, increased urinary frequency, hematuria  Ext: no leg edema Neuro: no unilateral weakness, numbness, or tingling, no vision change or hearing loss Skin: has skin tear and bruises, R supraorbital scalp hematoma MSK: No muscle spasm, no deformity, no limitation of range of movement in spin Heme: No easy bruising.  Travel history: No recent long distant travel.  Allergy: No Known Allergies  Past Medical History:  Diagnosis Date  . Anemia   . B12 deficiency 06/06/2016  . BPH (benign prostatic hyperplasia)   . CKD (chronic kidney disease), stage III   . Congenital malformation of spine    T12  . Cough for last 2 years   occasional white sputum  . Diverticulosis   . GERD with esophagitis   . History of kidney stones 2013  . HOH (hard of hearing)   . Hx of hydronephrosis   . Hydronephrosis 10/27/2014  . NHL (non-Hodgkin's lymphoma) ([place])    nhl dx 5/09;  . Pneumonia   . Prostate enlargement   . Sleep apnea   . Small cell B-cell lymphoma [facility])     Past Surgical History:  Procedure Laterality Date  . BOTOX INJECTION N/A 07/15/2013   Procedure: BOTOX INJECTION;  Surgeon: Milus Banister, MD;  Location: [facility] ENDOSCOPY;  Service: Endoscopy;  Laterality: N/A;  . CHOLECYSTECTOMY  yrs ago  .  ESOPHAGOGASTRODUODENOSCOPY (EGD) WITH PROPOFOL N/A 07/15/2013   Procedure: ESOPHAGOGASTRODUODENOSCOPY (EGD) WITH PROPOFOL;  Surgeon: Milus Banister, MD;  Location: [facility] ENDOSCOPY;  Service: Endoscopy;  Laterality: N/A;  botox injection  . EYE SURGERY  yrs ago   Implants bilateral  . INGUINAL HERNIA REPAIR  yrs ago   left  . PROSTATE SURGERY  yrs ago    Social History:  reports that he quit  smoking about 56 years ago. His smoking use included Pipe. He has a 0.75 pack-year smoking history. He has never used smokeless tobacco. He reports that he drinks about 0.6 oz of alcohol per week . He reports that he does not use drugs.  Family History:  Family History  Problem Relation Age of Onset  . Tuberculosis Mother   . Blindness Father      Prior to Admission medications   Medication Sig Start Date End Date Taking? Authorizing Provider  albuterol (PROVENTIL) (2.5 MG/3ML) 0.083% nebulizer solution Take 3 mLs (2.5 mg total) by nebulization every 6 (six) hours as needed for wheezing or shortness of breath. 03/19/16  Yes [name], MD  Amino Acids-Protein Hydrolys (FEEDING SUPPLEMENT, PRO-STAT SUGAR FREE 64,) LIQD Take 30 mLs by mouth 2 (two) times daily. 08/02/16  Yes [name], MD  benzonatate (TESSALON) 100 MG capsule Take by mouth 3 (three) times daily as needed for cough.   Yes Historical Provider, MD  bisacodyl (DULCOLAX) 10 MG suppository Place 10 mg rectally daily as needed for moderate constipation.   Yes Historical Provider, MD  Cholecalciferol (VITAMIN D3) 50000 units CAPS Take 1 capsule by mouth 2 (two) times a week.   Yes Historical Provider, MD  dextromethorphan (DELSYM) 30 MG/5ML liquid Take 60 mg by mouth as needed for cough.   Yes Historical Provider, MD  docusate sodium (COLACE) 100 MG capsule Take 100 mg by mouth 2 (two) times daily.   Yes Historical Provider, MD  famotidine (PEPCID) 10 MG tablet Take 1 tablet (10 mg total) by mouth daily. 06/17/16  Yes [name], MD  finasteride (PROSCAR) 5 MG tablet Take 5 mg by mouth daily with breakfast.    Yes Historical Provider, MD  glucosamine-chondroitin 500-400 MG tablet Take 1 tablet by mouth every morning.   Yes Historical Provider, MD  Hypromellose (ARTIFICIAL TEARS OP) Apply to eye as needed.   Yes Historical Provider, MD  Hypromellose (GENTEAL MILD TO MODERATE OP) Place 1 drop into both eyes 2 (two) times daily.   Yes  Historical Provider, MD  ibrutinib (IMBRUVICA) 140 MG capsul Take 420 mg by mouth every morning.   Yes Historical Provider, MD  levofloxacin (LEVAQUIN) 500 MG tablet Take 500 mg by mouth daily. Started on 10-14-16. Therapy for 7 days   Yes Historical Provider, MD  mirabegron ER (MYRBETRIQ) 50 MG TB24 tablet Take 50 mg by mouth daily.   Yes Historical Provider, MD  Multiple Vitamins-Minerals (ICAPS AREDS 2) CAPS Take 1 capsule by mouth 2 (two) times daily.   Yes Historical Provider, MD  oxybutynin (DITROPAN) 5 MG tablet Take 5 mg by mouth every 12 (twelve) hours as needed for bladder spasms.   Yes Historical Provider, MD  pantoprazole (PROTONIX) 40 MG tablet Take 40 mg by mouth daily.   Yes Historical Provider, MD  phenazopyridine (PYRIDIUM) 100 MG tablet Take 100 mg by mouth 3 (three) times daily as needed (bladder spasms).   Yes Historical Provider, MD  polyethylene glycol (MIRALAX / GLYCOLAX) packet Take 17 g by mouth daily as needed for mild  constipation or moderate constipation. 08/02/16  Yes [name], MD  PRESCRIPTION MEDICATION Take 120 mLs by mouth 4 (four) times daily. Medpass 2.0   Yes Historical Provider, MD  Sodium Phosphates (FLEET ENEMA RE) Place rectally as needed.   Yes Historical Provider, MD  pseudoephedrine-guaifenesin (MUCINEX D) 60-600 MG 12 hr tablet Take 1 tablet by mouth 2 (two) times daily as needed for congestion (cough).    Historical Provider, MD    Physical Exam: Vitals:   10/18/16 1800 10/18/16 1830 10/18/16 1900 10/18/16 2000  BP: 104/62 104/64 113/56 (!) 104/54  Pulse: 81 81 80 83  Resp: 21 22 18 16   Temp:      TempSrc:      SpO2: 98% 97% 97% 97%  Weight:      Height:       General: Not in acute distress HEENT:       Eyes: PERRL, EOMI, no scleral icterus.       ENT: No discharge from the ears and nose, no pharynx injection, no tonsillar enlargement.       Neck: No JVD, no bruit, no mass felt. Heme: No neck lymph node enlargement. Cardiac: S1/S2, RRR, No  murmurs, No gallops or rubs. Respiratory: Good air movement bilaterally. No rales, wheezing, rhonchi or rubs. GI: Soft, nondistended, nontender, no rebound pain, no organomegaly, BS present. GU: No hematuria Ext: No pitting leg edema bilaterally. 2+DP/PT pulse bilaterally. Musculoskeletal: No joint deformities, No joint redness or warmth, no limitation of ROM in spin. Skin: has skin tear in left knee and right  Neuro: Alert, oriented X3, cranial nerves II-XII grossly intact, moves all extremities normally. Muscle strength 5/5 in all extremities, sensation to light touch intact. Brachial reflex 2+ bilaterally. Negative Babinski's sign. Psych: Patient is not psychotic, no suicidal or hemocidal ideation.  Labs on Admission: I have personally reviewed following labs and imaging studies  CBC:  Recent Labs Lab 10/18/16 1634  WBC 5.5  NEUTROABS 3.2  HGB 9.0*  HCT 29.0*  MCV 83.1  PLT 81*   Basic Metabolic Panel:  Recent Labs Lab 10/18/16 1634  NA 134*  K 4.4  CL 102  CO2 23  GLUCOSE 96  BUN 55*  CREATININE 2.37*  CALCIUM 9.4   GFR: Estimated Creatinine Clearance: 14.2 mL/min (by C-G formula based on SCr of 2.37 mg/dL (H)). Liver Function Tests:  Recent Labs Lab 10/18/16 1634  AST 22  ALT 16*  ALKPHOS 137*  BILITOT 1.1  PROT 4.3*  ALBUMIN 2.4*   No results for input(s): LIPASE, AMYLASE in the last 168 hours. No results for input(s): AMMONIA in the last 168 hours. Coagulation Profile: No results for input(s): INR, PROTIME in the last 168 hours. Cardiac Enzymes: No results for input(s): CKTOTAL, CKMB, CKMBINDEX, TROPONINI in the last 168 hours. BNP (last 3 results) No results for input(s): PROBNP in the last 8760 hours. HbA1C: No results for input(s): HGBA1C in the last 72 hours. CBG: No results for input(s): GLUCAP in the last 168 hours. Lipid Profile: No results for input(s): CHOL, HDL, LDLCALC, TRIG, CHOLHDL, LDLDIRECT in the last 72 hours. Thyroid Function  Tests: No results for input(s): TSH, T4TOTAL, FREET4, T3FREE, THYROIDAB in the last 72 hours. Anemia Panel: No results for input(s): VITAMINB12, FOLATE, FERRITIN, TIBC, IRON, RETICCTPCT in the last 72 hours. Urine analysis:    Component Value Date/Time   COLORURINE YELLOW 10/18/2016 1800   APPEARANCEUR TURBID (A) 10/18/2016 1800   LABSPEC 1.014 10/18/2016 1800   PHURINE 6.0 10/18/2016  [place] 10/18/2016 1800   HGBUR LARGE (A) 10/18/2016 1800   BILIRUBINUR NEGATIVE 10/18/2016 1800   KETONESUR NEGATIVE 10/18/2016 1800   PROTEINUR 100 (A) 10/18/2016 1800   NITRITE NEGATIVE 10/18/2016 1800   LEUKOCYTESUR LARGE (A) 10/18/2016 1800   Sepsis Labs: @LABRCNTIP (procalcitonin:4,lacticidven:4) )No results found for this or any previous visit (from the past 240 hour(s)).   Radiological Exams on Admission: Dg Chest 1 View  Result Date: 10/18/2016 CLINICAL DATA:  Pain after trauma. EXAM: CHEST 1 VIEW COMPARISON:  August 01, 2016 FINDINGS: The heart size and mediastinal contours are within normal limits. Both lungs are clear. The visualized skeletal structures are unremarkable. IMPRESSION: No acute interval change. Electronically Signed   By: Dorise Bullion III M.D   On: 10/18/2016 17:37   Dg Pelvis 1-2 Views  Result Date: 10/18/2016 CLINICAL DATA:  Pain after fall EXAM: PELVIS - 1-2 VIEW COMPARISON:  None. FINDINGS: There is no evidence of pelvic fracture or diastasis. No pelvic bone lesions are seen. IMPRESSION: Negative. Electronically Signed   By: Dorise Bullion III M.D   On: 10/18/2016 17:35   Ct Head Wo Contrast  Result Date: 10/18/2016 CLINICAL DATA:  Fall in bathroom today. Right supraorbital laceration. History of thrombocytopenia. EXAM: CT HEAD WITHOUT CONTRAST CT MAXILLOFACIAL WITHOUT CONTRAST CT CERVICAL SPINE WITHOUT CONTRAST TECHNIQUE: Multidetector CT imaging of the head, cervical spine, and maxillofacial structures were performed using the standard protocol  without intravenous contrast. Multiplanar CT image reconstructions of the cervical spine and maxillofacial structures were also generated. COMPARISON:  PET-CT 02/26/2016. FINDINGS: CT HEAD FINDINGS Brain: There is no evidence of acute intracranial hemorrhage, mass lesion, brain edema or extra-axial fluid collection. There is stable generalized prominence of the ventricles and subarachnoid spaces consistent with moderate atrophy. There are relatively mild chronic small vessel ischemic changes in the periventricular white matter. There is no CT evidence of acute cortical infarction. Vascular: Intracranial vascular calcifications are present. Skull: Prominent soft tissue swelling in the right supraorbital scalp. No evidence of calvarial fracture. Sinuses/Orbits: Facial findings below. No evidence of orbital hematoma. The mastoid air cells and middle ears are clear. Other: None. CT MAXILLOFACIAL FINDINGS Osseous: No evidence of acute facial fracture. The temporomandibular joints are intact. Orbits: Right supraorbital scalp hematoma. No evidence of orbital hematoma. The globes are intact. The optic nerves and extraocular muscles appear normal. Sinuses: Scattered mucosal thickening in the right maxillary, bilateral ethmoid and bilateral sphenoid sinuses. Possible left sphenoid sinus air-fluid level. Soft tissues: None additional. CT CERVICAL SPINE FINDINGS Alignment: Mild convex right scoliosis. The lateral alignment is normal. Skull base and vertebrae: No evidence of acute fracture or traumatic subluxation. There is multilevel spondylosis, primarily affecting the facet joints, worse on the left. Soft tissues and spinal canal: No prevertebral fluid or swelling. No visible canal hematoma. Disc levels: No evidence of high-grade spinal stenosis or large disc herniation. There is multilevel spondylosis with asymmetric left-sided facet hypertrophy. There is some resulting foraminal narrowing, worst on the left at C3-4. Upper  chest: Stable without suspicious findings. Other: None. IMPRESSION: 1. Right supraorbital scalp hematoma. No evidence of calvarial fracture. 2. No acute intracranial findings. 3. No evidence of orbital hematoma, maxillofacial or cervical spine fracture. 4. Paranasal sinus mucosal thickening, likely inflammatory. 5. Cervical spondylosis as described. Electronically Signed   By: Richardean Sale M.D.   On: 10/18/2016 17:10   Ct Cervical Spine Wo Contrast  Result Date: 10/18/2016 CLINICAL DATA:  Fall in bathroom today. Right supraorbital laceration. History  of thrombocytopenia. EXAM: CT HEAD WITHOUT CONTRAST CT MAXILLOFACIAL WITHOUT CONTRAST CT CERVICAL SPINE WITHOUT CONTRAST TECHNIQUE: Multidetector CT imaging of the head, cervical spine, and maxillofacial structures were performed using the standard protocol without intravenous contrast. Multiplanar CT image reconstructions of the cervical spine and maxillofacial structures were also generated. COMPARISON:  PET-CT 02/26/2016. FINDINGS: CT HEAD FINDINGS Brain: There is no evidence of acute intracranial hemorrhage, mass lesion, brain edema or extra-axial fluid collection. There is stable generalized prominence of the ventricles and subarachnoid spaces consistent with moderate atrophy. There are relatively mild chronic small vessel ischemic changes in the periventricular white matter. There is no CT evidence of acute cortical infarction. Vascular: Intracranial vascular calcifications are present. Skull: Prominent soft tissue swelling in the right supraorbital scalp. No evidence of calvarial fracture. Sinuses/Orbits: Facial findings below. No evidence of orbital hematoma. The mastoid air cells and middle ears are clear. Other: None. CT MAXILLOFACIAL FINDINGS Osseous: No evidence of acute facial fracture. The temporomandibular joints are intact. Orbits: Right supraorbital scalp hematoma. No evidence of orbital hematoma. The globes are intact. The optic nerves and  extraocular muscles appear normal. Sinuses: Scattered mucosal thickening in the right maxillary, bilateral ethmoid and bilateral sphenoid sinuses. Possible left sphenoid sinus air-fluid level. Soft tissues: None additional. CT CERVICAL SPINE FINDINGS Alignment: Mild convex right scoliosis. The lateral alignment is normal. Skull base and vertebrae: No evidence of acute fracture or traumatic subluxation. There is multilevel spondylosis, primarily affecting the facet joints, worse on the left. Soft tissues and spinal canal: No prevertebral fluid or swelling. No visible canal hematoma. Disc levels: No evidence of high-grade spinal stenosis or large disc herniation. There is multilevel spondylosis with asymmetric left-sided facet hypertrophy. There is some resulting foraminal narrowing, worst on the left at C3-4. Upper chest: Stable without suspicious findings. Other: None. IMPRESSION: 1. Right supraorbital scalp hematoma. No evidence of calvarial fracture. 2. No acute intracranial findings. 3. No evidence of orbital hematoma, maxillofacial or cervical spine fracture. 4. Paranasal sinus mucosal thickening, likely inflammatory. 5. Cervical spondylosis as described. Electronically Signed   By: Richardean Sale M.D.   On: 10/18/2016 17:10   Dg Knee Complete 4 Views Left  Result Date: 10/18/2016 CLINICAL DATA:  Pain after fall EXAM: LEFT KNEE - COMPLETE 4+ VIEW COMPARISON:  None. FINDINGS: Degenerative changes most prominent in the medial compartment. No fracture or effusion. IMPRESSION: Degenerative changes. Electronically Signed   By: Dorise Bullion III M.D   On: 10/18/2016 17:34   Ct Maxillofacial Wo Contrast  Result Date: 10/18/2016 CLINICAL DATA:  Fall in bathroom today. Right supraorbital laceration. History of thrombocytopenia. EXAM: CT HEAD WITHOUT CONTRAST CT MAXILLOFACIAL WITHOUT CONTRAST CT CERVICAL SPINE WITHOUT CONTRAST TECHNIQUE: Multidetector CT imaging of the head, cervical spine, and maxillofacial  structures were performed using the standard protocol without intravenous contrast. Multiplanar CT image reconstructions of the cervical spine and maxillofacial structures were also generated. COMPARISON:  PET-CT 02/26/2016. FINDINGS: CT HEAD FINDINGS Brain: There is no evidence of acute intracranial hemorrhage, mass lesion, brain edema or extra-axial fluid collection. There is stable generalized prominence of the ventricles and subarachnoid spaces consistent with moderate atrophy. There are relatively mild chronic small vessel ischemic changes in the periventricular white matter. There is no CT evidence of acute cortical infarction. Vascular: Intracranial vascular calcifications are present. Skull: Prominent soft tissue swelling in the right supraorbital scalp. No evidence of calvarial fracture. Sinuses/Orbits: Facial findings below. No evidence of orbital hematoma. The mastoid air cells and middle ears are clear. Other: None.  CT MAXILLOFACIAL FINDINGS Osseous: No evidence of acute facial fracture. The temporomandibular joints are intact. Orbits: Right supraorbital scalp hematoma. No evidence of orbital hematoma. The globes are intact. The optic nerves and extraocular muscles appear normal. Sinuses: Scattered mucosal thickening in the right maxillary, bilateral ethmoid and bilateral sphenoid sinuses. Possible left sphenoid sinus air-fluid level. Soft tissues: None additional. CT CERVICAL SPINE FINDINGS Alignment: Mild convex right scoliosis. The lateral alignment is normal. Skull base and vertebrae: No evidence of acute fracture or traumatic subluxation. There is multilevel spondylosis, primarily affecting the facet joints, worse on the left. Soft tissues and spinal canal: No prevertebral fluid or swelling. No visible canal hematoma. Disc levels: No evidence of high-grade spinal stenosis or large disc herniation. There is multilevel spondylosis with asymmetric left-sided facet hypertrophy. There is some resulting  foraminal narrowing, worst on the left at C3-4. Upper chest: Stable without suspicious findings. Other: None. IMPRESSION: 1. Right supraorbital scalp hematoma. No evidence of calvarial fracture. 2. No acute intracranial findings. 3. No evidence of orbital hematoma, maxillofacial or cervical spine fracture. 4. Paranasal sinus mucosal thickening, likely inflammatory. 5. Cervical spondylosis as described. Electronically Signed   By: Richardean Sale M.D.   On: 10/18/2016 17:10     EKG: Independently reviewed.  Sinus rhythm, QTC 448, bifascicular block which is old   Assessment/Plan Principal Problem:   Fall Active Problems:   ACHALASIA   Lymphoma, small lymphocytic (HCC)   Hydroureteronephrosis   Bronchiectasis without acute exacerbation (HCC)   Anemia in neoplastic disease   Acute renal failure superimposed on stage 3 chronic kidney disease (HCC)   Thrombocytopenia (HCC)   UTI (urinary tract infection)   Chronic diastolic CHF (congestive heart failure) (HCC)   BPH (benign prostatic hyperplasia)   GERD (gastroesophageal reflux disease)   Fall: Etiology is not clear. CT head is negative for intracranial abnormalities. No focal neurological findings, less likely to have stroke. Patient denies LOC to me. Possible differential diagnoses include vasovagal syncope, generalized weakness , deconditioning and possible UTI.   -will place on tele bed for obs -PT/OT -check orthostatic vital signs -Treat possible UTI as below -IV fluid: 500 mL normal saline and then 75 mL per hour  Possible UTI (urinary tract infection):  Patient has positive urinalysis with large amount of leukocyte, not completely sure if this is colonization or new UTI. Given his worsening renal function and fall, will treat presumably as UTI. Patient had positive urinalysis with Pseudomonas with pensensitivity on 07/28/16. -IV Zosyn -Follow-up blood culture and urine culture. irrigation of Foley catheter  Hx of ACHALASIA:  Still has difficulty swallowing -SLP  Bronchiectasis without acute exacerbation: Patient has chronic cough. He has been follow-up by pulmonologist. Patient was seen by pulmonology, NP Parrett on 10/18/16. Pt had neg sputum for AFB . Per clinic note, "Previous PET in March 2017 showed decreased left central mesenteric mass. + LLL nodule was hypermetabolic -this was felt to be inflammatory/infectious in nature from recurrent PNA ? Aspiration"  -will continue Pepcid and Protonix for GERD -prn albuterol nebulizer -Mucinex for cough -Follow-up with pulmonology  AoCKD-III and hx of hydronephrosis: Baseline Cre is 1.3-1.6, pt's Cre is 2.37 and BUN 55 on admission. Likely due to prerenal secondary to dehydration. Pt has hx of R hydronephrosis. - IVF as above - Check FeNa  -US-renal - Follow up renal function by BMP  Addendum: renal US showed marked hydronephrosis of the right kidney grossly which is unchanged, also showed new moderate severe hydronephrosis of left kidney. I  went to checked pt on the floor, RN reports that she just removed 350 cc of urine in bag at pt's arrival to floor, ttere is already about 50 cc of urine in bag now-->indicating less likely to have obstruction in catheter itself. I consulted urology, dr. Alinda Money who recommended to check Foley catheter, if no obstruction in Foley catheter, will need to get CT scan to rule out obstruction. Pt has no fever or signs of sepsis now.  -will get CT-per renal prototocol now -Highly appreciate Dr. 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 Platelet 81. Patient has bruises, but no active bleeding. Mental status is normal-->No TTP. -f/u by CBC  Chronic diastolic CHF (congestive heart failure) ([place]): 2-D echo on 08/01/16 showed EF 60-65% with grade 1 diastolic dysfunction. Patient is not on diuretics. No leg edema or JVD, CHF is compensated on admission. -Check BNP  BPH  (benign prostatic hyperplasia): -continue Proscar, oxybutynin, Myrbetriq  GERD (gastroesophageal reflux disease): -Pepcid and Protonix  NHL and small cell B lynphoma: pt is followed by Dr. Addison [name]. Currently on imbruvica -Continue home [place] -f/u with Dr. Addison [name]  Skin tear and bruises: -wound care consult    DVT ppx: SCD Code Status: Full code Family Communication: None at bed side. Disposition Plan:  Anticipate discharge back to previous SNF environment Consults called:  none Admission status: Obs / tele     Date of Service 10/18/2016    Ivor Costa [facility] Pager [phone]  If 7PM-7AM, please contact night-coverage [email or web address] Password [email or web address] 10/18/2016, 8:28 PM

## 2016-10-18 NOTE — Assessment & Plan Note (Signed)
Mild bronchietasis with cough flare in patient with multiple co-morbities -Lymphoma , chronic anemia and esophagitis /achalasia . There were some changes in LLL lobe on previous PET scan in March. He has been treated with multiple antibiotics over the last few months . This may be from low immune system along with aspiration , GERD/Aspiration, ?medication side effect /bronchiectatic flare .  He is former smoker and has weight loss . Will set up for non contrasted CT chest (CKD-no contrast ) to evaluate further .  Treat cough for now and control for triggers -GERD  Tried to check sputum cx but he was unable to give today .  He will follow up in 3 weeks with .Dr. Vaughan Browner   Plan  Patient Instructions  We are setting you up for a CT chest .  We need to control your reflux to prevent aspiration that can aggravate your lungs Change Pepcid to 20 mg at bedtime Continue on Protonix 40 mg daily before meal Begin Flonase 2 puffs daily Stop using Mucinex D Begin Mucinex 600 mg twice daily Begin  Delsym 2 teaspoons twice daily Check sputum culture .  Follow-up in 3 weeks with Dr. Vaughan Browner .  Please contact office for sooner follow up if symptoms do not improve or worsen or seek emergency care

## 2016-10-18 NOTE — ED Triage Notes (Signed)
Patient comes in per [facility] post fall at . Patient states he went to the bathroom to have a BM and slipped when he stood up. Patient denies LOC. States he is in no pain. Patient a/ox4. Patient has laceration above right eye, on right shoulder, and on left knee/lower leg. Hx of thrombocytopenia. Patient is not on any blood thinners. EMS v/s stable. fsbs per ems 149.

## 2016-10-18 NOTE — Progress Notes (Signed)
Pharmacy Antibiotic Note  Daniel [name] is a 82 y.o. male admitted on 10/18/2016 s/p fall.   Pharmacy has been consulted for Zosyn dosing for UTI.  Patient has a history of Pseudomonas infection and currently has an indwelling catheter that was recently changed.  He has AKI and is also immunosuppressed.  Plan: - Zosyn 2.25gm IV Q6H - Monitor renal fxn, clinical progress, micro data  Height: 5\' 5"  (165.1 cm) Weight: 119 lb (54 kg) IBW/kg (Calculated) : 61.5  Temp (24hrs), Avg:97.2 F (36.2 C), Min:96.8 F (36 C), Max:97.5 F (36.4 C)   Recent Labs Lab 10/18/16 1634  WBC 5.5  CREATININE 2.37*    Estimated Creatinine Clearance: 14.2 mL/min (by C-G formula based on SCr of 2.37 mg/dL (H)).    No Known Allergies  Antimicrobials this admission:  Zosyn 11/10 >>  Dose adjustments this admission:  N/A  Microbiology results:  11/10 UCx -    Garon Melander D. Mina Marble, PharmD, BCPS Pager:  [phone] 10/18/2016, 7:42 PM

## 2016-10-19 DIAGNOSIS — K220 Achalasia of cardia: Secondary | ICD-10-CM | POA: Diagnosis present

## 2016-10-19 DIAGNOSIS — M6281 Muscle weakness (generalized): Secondary | ICD-10-CM | POA: Diagnosis not present

## 2016-10-19 DIAGNOSIS — N179 Acute kidney failure, unspecified: Secondary | ICD-10-CM | POA: Diagnosis not present

## 2016-10-19 DIAGNOSIS — N1330 Unspecified hydronephrosis: Secondary | ICD-10-CM | POA: Diagnosis present

## 2016-10-19 DIAGNOSIS — Z9049 Acquired absence of other specified parts of digestive tract: Secondary | ICD-10-CM | POA: Diagnosis not present

## 2016-10-19 DIAGNOSIS — Z9889 Other specified postprocedural states: Secondary | ICD-10-CM | POA: Diagnosis not present

## 2016-10-19 DIAGNOSIS — W19XXXA Unspecified fall, initial encounter: Secondary | ICD-10-CM | POA: Diagnosis not present

## 2016-10-19 DIAGNOSIS — J690 Pneumonitis due to inhalation of food and vomit: Secondary | ICD-10-CM | POA: Diagnosis present

## 2016-10-19 DIAGNOSIS — Z515 Encounter for palliative care: Secondary | ICD-10-CM | POA: Diagnosis not present

## 2016-10-19 DIAGNOSIS — J479 Bronchiectasis, uncomplicated: Secondary | ICD-10-CM | POA: Diagnosis not present

## 2016-10-19 DIAGNOSIS — E860 Dehydration: Secondary | ICD-10-CM | POA: Diagnosis present

## 2016-10-19 DIAGNOSIS — N390 Urinary tract infection, site not specified: Secondary | ICD-10-CM | POA: Diagnosis not present

## 2016-10-19 DIAGNOSIS — N183 Chronic kidney disease, stage 3 (moderate): Secondary | ICD-10-CM | POA: Diagnosis not present

## 2016-10-19 DIAGNOSIS — J471 Bronchiectasis with (acute) exacerbation: Secondary | ICD-10-CM | POA: Diagnosis present

## 2016-10-19 DIAGNOSIS — K219 Gastro-esophageal reflux disease without esophagitis: Secondary | ICD-10-CM | POA: Diagnosis present

## 2016-10-19 DIAGNOSIS — Z79899 Other long term (current) drug therapy: Secondary | ICD-10-CM | POA: Diagnosis not present

## 2016-10-19 DIAGNOSIS — N401 Enlarged prostate with lower urinary tract symptoms: Secondary | ICD-10-CM | POA: Diagnosis not present

## 2016-10-19 DIAGNOSIS — D61818 Other pancytopenia: Secondary | ICD-10-CM | POA: Diagnosis present

## 2016-10-19 DIAGNOSIS — Z7189 Other specified counseling: Secondary | ICD-10-CM | POA: Diagnosis not present

## 2016-10-19 DIAGNOSIS — E43 Unspecified severe protein-calorie malnutrition: Secondary | ICD-10-CM | POA: Diagnosis present

## 2016-10-19 DIAGNOSIS — C8590 Non-Hodgkin lymphoma, unspecified, unspecified site: Secondary | ICD-10-CM | POA: Diagnosis present

## 2016-10-19 DIAGNOSIS — Z821 Family history of blindness and visual loss: Secondary | ICD-10-CM | POA: Diagnosis not present

## 2016-10-19 DIAGNOSIS — K210 Gastro-esophageal reflux disease with esophagitis: Secondary | ICD-10-CM | POA: Diagnosis present

## 2016-10-19 DIAGNOSIS — Z87891 Personal history of nicotine dependence: Secondary | ICD-10-CM | POA: Diagnosis not present

## 2016-10-19 DIAGNOSIS — Y92121 Bathroom in nursing home as the place of occurrence of the external cause: Secondary | ICD-10-CM | POA: Diagnosis not present

## 2016-10-19 DIAGNOSIS — D630 Anemia in neoplastic disease: Secondary | ICD-10-CM | POA: Diagnosis present

## 2016-10-19 DIAGNOSIS — Z23 Encounter for immunization: Secondary | ICD-10-CM | POA: Diagnosis present

## 2016-10-19 DIAGNOSIS — Z66 Do not resuscitate: Secondary | ICD-10-CM | POA: Diagnosis not present

## 2016-10-19 DIAGNOSIS — A4189 Other specified sepsis: Secondary | ICD-10-CM | POA: Diagnosis not present

## 2016-10-19 DIAGNOSIS — F0390 Unspecified dementia without behavioral disturbance: Secondary | ICD-10-CM | POA: Diagnosis present

## 2016-10-19 DIAGNOSIS — H9190 Unspecified hearing loss, unspecified ear: Secondary | ICD-10-CM | POA: Diagnosis present

## 2016-10-19 DIAGNOSIS — Z87442 Personal history of urinary calculi: Secondary | ICD-10-CM | POA: Diagnosis not present

## 2016-10-19 DIAGNOSIS — R627 Adult failure to thrive: Secondary | ICD-10-CM | POA: Diagnosis not present

## 2016-10-19 DIAGNOSIS — G4730 Sleep apnea, unspecified: Secondary | ICD-10-CM | POA: Diagnosis present

## 2016-10-19 DIAGNOSIS — W010XXA Fall on same level from slipping, tripping and stumbling without subsequent striking against object, initial encounter: Secondary | ICD-10-CM | POA: Diagnosis not present

## 2016-10-19 DIAGNOSIS — A419 Sepsis, unspecified organism: Secondary | ICD-10-CM | POA: Diagnosis not present

## 2016-10-19 DIAGNOSIS — I5032 Chronic diastolic (congestive) heart failure: Secondary | ICD-10-CM | POA: Diagnosis present

## 2016-10-19 LAB — CBC
HEMATOCRIT: 23.6 % — AB (ref 39.0–52.0)
HEMOGLOBIN: 7.5 g/dL — AB (ref 13.0–17.0)
MCH: 26.6 pg (ref 26.0–34.0)
MCHC: 31.8 g/dL (ref 30.0–36.0)
MCV: 83.7 fL (ref 78.0–100.0)
Platelets: 76 10*3/uL — ABNORMAL LOW (ref 150–400)
RBC: 2.82 MIL/uL — ABNORMAL LOW (ref 4.22–5.81)
RDW: 17 % — ABNORMAL HIGH (ref 11.5–15.5)
WBC: 3.3 10*3/uL — ABNORMAL LOW (ref 4.0–10.5)

## 2016-10-19 LAB — URINE CULTURE

## 2016-10-19 LAB — BASIC METABOLIC PANEL
ANION GAP: 8 (ref 5–15)
BUN: 52 mg/dL — ABNORMAL HIGH (ref 6–20)
CHLORIDE: 107 mmol/L (ref 101–111)
CO2: 24 mmol/L (ref 22–32)
Calcium: 8.9 mg/dL (ref 8.9–10.3)
Creatinine, Ser: 2.17 mg/dL — ABNORMAL HIGH (ref 0.61–1.24)
GFR calc Af Amer: 28 mL/min — ABNORMAL LOW (ref >60)
GFR, EST NON AFRICAN AMERICAN: 24 mL/min — AB (ref >60)
GLUCOSE: 120 mg/dL — AB (ref 65–99)
POTASSIUM: 4 mmol/L (ref 3.5–5.1)
Sodium: 139 mmol/L (ref 135–145)

## 2016-10-19 LAB — CREATININE, URINE, RANDOM: CREATININE, URINE: 55.34 mg/dL

## 2016-10-19 LAB — MRSA PCR SCREENING: MRSA by PCR: POSITIVE — AB

## 2016-10-19 LAB — SODIUM, URINE, RANDOM: SODIUM UR: 60 mmol/L

## 2016-10-19 MED ORDER — SODIUM CHLORIDE 0.9 % IV BOLUS (SEPSIS)
500.0000 mL | Freq: Once | INTRAVENOUS | Status: AC
  Administered 2016-10-19: 500 mL via INTRAVENOUS

## 2016-10-19 MED ORDER — MUPIROCIN 2 % EX OINT
1.0000 "application " | TOPICAL_OINTMENT | Freq: Two times a day (BID) | CUTANEOUS | Status: AC
  Administered 2016-10-19 – 2016-10-23 (×10): 1 via NASAL
  Filled 2016-10-19 (×2): qty 22

## 2016-10-19 MED ORDER — ENSURE ENLIVE PO LIQD
237.0000 mL | Freq: Three times a day (TID) | ORAL | Status: DC
  Administered 2016-10-19 – 2016-10-24 (×12): 237 mL via ORAL

## 2016-10-19 MED ORDER — CHLORHEXIDINE GLUCONATE CLOTH 2 % EX PADS
6.0000 | MEDICATED_PAD | Freq: Every day | CUTANEOUS | Status: DC
  Administered 2016-10-19 – 2016-10-23 (×4): 6 via TOPICAL

## 2016-10-19 MED ORDER — MENTHOL 3 MG MT LOZG: 1.0000 | LOZENGE | OROMUCOSAL | Status: DC | PRN

## 2016-10-19 MED ORDER — SODIUM CHLORIDE 0.9 % IV SOLN
INTRAVENOUS | Status: DC
  Administered 2016-10-19 (×2): via INTRAVENOUS
  Administered 2016-10-19: 125 mL/h via INTRAVENOUS
  Administered 2016-10-20: 09:00:00 via INTRAVENOUS
  Administered 2016-10-21: 1000 mL via INTRAVENOUS

## 2016-10-19 NOTE — Evaluation (Signed)
Clinical/Bedside Swallow Evaluation Patient Details  Name: Daniel [name] MRN: [id number] Date of Birth: 09/01/1919  Today's Date: 10/19/2016 Time: SLP Start Time (ACUTE ONLY): 0845 SLP Stop Time (ACUTE ONLY): 0909 SLP Time Calculation (min) (ACUTE ONLY): 24 min  Past Medical History:  Past Medical History:  Diagnosis Date  . Anemia   . B12 deficiency 06/06/2016  . BPH (benign prostatic hyperplasia)   . CKD (chronic kidney disease), stage III   . Congenital malformation of spine    T12  . Cough for last 2 years   occasional white sputum  . Diverticulosis   . GERD with esophagitis   . History of kidney stones 2013  . HOH (hard of hearing)   . Hx of hydronephrosis   . Hydronephrosis 10/27/2014  . NHL (non-Hodgkin's lymphoma) ([place])    nhl dx 5/09;  . Pneumonia   . Prostate enlargement   . Sleep apnea   . 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 BOTOX INJECTION N/A 07/15/2013   Procedure: BOTOX INJECTION;  Surgeon: Milus Banister, MD;  Location: [facility];  Service: Endoscopy;  Laterality: N/A;  . CHOLECYSTECTOMY  yrs ago  . ESOPHAGOGASTRODUODENOSCOPY (EGD) WITH PROPOFOL N/A 07/15/2013   Procedure: ESOPHAGOGASTRODUODENOSCOPY (EGD) WITH PROPOFOL;  Surgeon: Milus Banister, MD;  Location: [facility];  Service: Endoscopy;  Laterality: N/A;  botox injection  . EYE SURGERY  yrs ago   Implants bilateral  . INGUINAL HERNIA REPAIR  yrs ago   left  . PROSTATE SURGERY  yrs ago   HPI:  Daniel [name] a 82 y.o.malewith medical history significant of indwelling cath, NHL, small cell B-lymphoma, right-sided hydronephrosis, poor hearing wearing hearing aids, BPH, GERD, CKD-III, dCHF, mild bronchiectasis with chronic cough, who presents with fall.   Per report, pt fell in SNF. He reports to me and also to RN in ED that his fall happed after had bowel movement and slipped in bathroom.  The patient does complain  of trouble swallowing.  He reported that about 3 years ago he had botex injections for a "small esophagus" and that he recently saw the GI doctor again but they are not able to repeat the botox injections.  He reported that his current symptoms are similar to just prior to his botox injections 3 years ago.  Most recent chest xray is negative for acute findings, however, the patient is dehydrated with a bolus of fluid just started.  Head CT is negative for acute findings.  The patient has had no previously documented swallowing evaluation at [facility].     Assessment / Plan / Recommendation Clinical Impression  Clinical swallowing evaluation was completed using thin liquids via tsp, cup and straw, pureed material and dry solids.  Oral mechanism exam was completed and remarkable only for mild right side facial and labial droop seen mostly during labial movement.  The patient complains of trouble swallowing characterized by a globus sensation.  He reported that he recieved botox injections approximately three years ago and has recently followed up with his GI doctor due to recent onset of similar symptoms but that they are unable to repeat the botox injections.  The patient presented with mild oral dysphagia characterized by delayed oral transit primarily for solids and most likely impacted by dentition.    Swallow trigger was relatively timely and hyo-laryngeal excursion to palpation was judged to be adequate.  Overt s/s of aspiration were not  seen.  Belching was observed several times during the assessment further suggesting esophageal dysphagia.  Recommend continue with a regular diet and thin liquids.  Strategies to facilitate esophageal clearance were reviewed with the patient (i.e. alternating food/liquids, using hot beverage with meals).  ST will follow up for therapeutic diet tolerance and continued education.      Aspiration Risk  Mild aspiration risk    Diet Recommendation   Regular diet with thin liquids.     Medication Administration: Whole meds with liquid    Other  Recommendations Oral Care Recommendations: Oral care BID   Follow up Recommendations  (TBD)      Frequency and Duration min 2x/week  2 weeks     Swallow Study   General Date of Onset: 10/18/16 HPI: Daniel [name] a 82 y.o.malewith medical history significant of indwelling cath, NHL, small cell B-lymphoma, right-sided hydronephrosis, poor hearing wearing hearing aids, BPH, GERD, CKD-III, dCHF, mild bronchiectasis with chronic cough, who presents with fall.   Per report, pt fell in SNF. He reports to me and also to RN in ED that his fall happed after had bowel movement and slipped in bathroom.  The patient does complain of trouble swallowing.  He reported that about 3 years ago he had botex injections for a "small esophagus" and that he recently saw the GI doctor again but they are not able to repeat the botox injections.  He reported that his current symptoms are similar to just prior to his botox injections 3 years ago.  Most recent chest xray is negative for acute findings, however, the patient is dehydrated with a bolus of fluid just started.  Head CT is negative for acute findings.  The patient has had no previously documented swallowing evaluation at [facility]   Type of Study: Bedside Swallow Evaluation Previous Swallow Assessment: None noted.   Diet Prior to this Study: Regular;Thin liquids Temperature Spikes Noted: No History of Recent Intubation: No Behavior/Cognition: Alert;Cooperative;Pleasant mood Oral Cavity Assessment: Within Functional Limits Oral Care Completed by SLP: No Oral Cavity - Dentition: Missing dentition Vision: Functional for self-feeding Self-Feeding Abilities: Able to feed self Patient Positioning: Upright in bed Baseline Vocal Quality: Normal Volitional Cough: Strong Volitional Swallow: Able to elicit    Oral/Motor/Sensory Function Overall Oral Motor/Sensory Function: Mild impairment Facial  ROM: Reduced right Facial Symmetry: Abnormal symmetry right Lingual ROM: Within Functional Limits Lingual Symmetry: Within Functional Limits Lingual Strength:  (CNT) Mandible: Within Functional Limits   Ice Chips Ice chips: Not tested   Thin Liquid Thin Liquid: Within functional limits Presentation: Cup;Spoon;Straw;Self Fed    Nectar Thick Nectar Thick Liquid: Not tested   Honey Thick Honey Thick Liquid: Not tested   Puree Puree: Impaired Presentation: Spoon Oral Phase Impairments: Impaired mastication Oral Phase Functional Implications: Prolonged oral transit Pharyngeal Phase Impairments: Multiple swallows   Solid   GO   Solid: Impaired Presentation: Self Fed Oral Phase Impairments: Impaired mastication Oral Phase Functional Implications: Prolonged oral transit    Functional Assessment Tool Used: ASHA NOMS and clinical judgment.   Functional Limitations: Swallowing Swallow Current Status [phone]): At least 1 percent but less than 20 percent impaired, limited or restricted Swallow Goal Status [phone]): At least 1 percent but less than 20 percent impaired, limited or restricted  Shelly Flatten, MA, [place] Shelly Flatten N 10/19/2016,9:23 AM

## 2016-10-19 NOTE — Progress Notes (Signed)
[facility] Progress Note  Patient: Daniel [name] [id number]   PCP: Wenda Low, MD DOB: 1919-10-18   DOA: 10/18/2016   DOS: 10/19/2016   Date of Service: the patient was seen and examined on 10/19/2016  Brief hospital course: Pt. with PMH of indwelling cath, NHL, small cell B-lymphoma, right-sided hydronephrosis, poor hearing wearing hearing aids, BPH, GERD, CKD-III, dCHF, mild bronchiectasis with chronic cough; admitted on 10/18/2016, with complaint of fall, was found to have Sepsis due to UTI. Currently further plan is continue antibiotics.  Assessment and Plan: 1.Fall: Etiology is not clear. CT head is negative for intracranial abnormalities. No focal neurological findings, less likely to have stroke. Likely vasovagal syncope, generalized weakness , deconditioning and possible UTI.   2. Possible UTI (urinary tract infection):  Patient has positive urinalysis with large amount of leukocyte, not completely sure if this is colonization or new UTI. Given his worsening renal function and fall, will treat presumably as UTI. Patient had positive urinalysis with Pseudomonas with pensensitivity on 07/28/16. -IV Zosyn -Follow-up blood culture and urine culture. irrigation of Foley catheter  Hx of ACHALASIA:  Patient complains about difficulty swallowing -SLP recommends regular diet with thin liquids  Bronchiectasis without acute exacerbation: Patient has chronic cough. He has been follow-up by pulmonologist. Patient was seen by pulmonology, NP Parrett on 10/18/16. Pt had neg sputum for AFB . Per clinic note, "Previous PET in March 2017 showed decreased left central mesenteric mass. + LLL nodule was hypermetabolic -this was felt to be inflammatory/infectious in nature from recurrent PNA ? Aspiration"  -will continue Pepcid and Protonix for GERD -prn albuterol nebulizer -Mucinex for cough -Follow-up with pulmonology  AoCKD-III and hx of hydronephrosis: Baseline Cre is 1.3-1.6,  pt's Cre is 2.37 and BUN 55 on admission. Likely due to prerenal secondary to dehydration. Pt has hx of R hydronephrosis. - IVF as above - Check FeNa  -US-renal - Follow up renal function by BMP -Highly appreciate Dr. 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 Platelet 81. Patient has bruises, but no active bleeding. Mental status is normal-->No TTP. -f/u by CBC  Chronic diastolic CHF (congestive heart failure) ([place]): 2-D echo on 08/01/16 showed EF 60-65% with grade 1 diastolic dysfunction. Patient is not on diuretics. No leg edema or JVD, CHF is compensated on admission. -normal BNP  BPH (benign prostatic hyperplasia): -continue Proscar, oxybutynin, Myrbetriq  GERD (gastroesophageal reflux disease): -Pepcid and Protonix  NHL and small cell B lynphoma: pt is followed by Dr. Addison [name]. Currently on imbruvica -Continue home [place] -f/u with Dr. Addison [name]  Skin tear and bruises: -wound care consult  Pain management: When necessary Tylenol Activity: Consulted physical therapy Bowel regimen: last BM 10/18/2016 Diet: Cardiac diet DVT Prophylaxis: subcutaneous Heparin  Advance goals of care discussion: DNR/DNI  Family Communication: no family was present at bedside, at the time of interview.  Disposition:  Discharge to SNF. Expected discharge date: 10/21/2016,   Consultants: Urologist Procedures: Ultrasound renal  Antibiotics: Anti-infectives    Start     Dose/Rate Route Frequency Ordered Stop   10/19/16 0300  piperacillin-tazobactam (ZOSYN) IVPB 2.25 g     2.25 g 100 mL/hr over 30 Minutes Intravenous Every 6 hours 10/18/16 1944     10/18/16 1930  piperacillin-tazobactam (ZOSYN) IVPB 2.25 g     2.25 g 100 mL/hr over 30 Minutes Intravenous  Once 10/18/16 1927 10/18/16 2209   10/18/16 1845  cefTRIAXone (ROCEPHIN) 1 g in dextrose 5 % 50  mL IVPB  Status:  Discontinued     1 g 100 mL/hr over 30  Minutes Intravenous  Once 10/18/16 1831 10/18/16 1925     Subjective: Denies any acute complaint. No abdominal pain or nausea and vomiting. Continues to feel fatigued.  Objective: Physical Exam: Vitals:   10/19/16 0400 10/19/16 0759 10/19/16 1215 10/19/16 1635  BP: 102/64 (!) 94/56 107/90 103/86  Pulse: 78 79 79 84  Resp: (!) 28 (!) 21 14 (!) 22  Temp:  97.8 F (36.6 C)  97.8 F (36.6 C)  TempSrc:  Oral  Oral  SpO2: 98% 98% 94% 100%  Weight:      Height:        Intake/Output Summary (Last 24 hours) at 10/19/16 1639 Last data filed at 10/19/16 1538  Gross per 24 hour  Intake          2301.67 ml  Output             1125 ml  Net          1176.67 ml   Filed Weights   10/18/16 1613 10/18/16 2113 10/19/16 0339  Weight: 54 kg (119 lb) 53.4 kg (117 lb 12.8 oz) 53.9 kg (118 lb 12.8 oz)    General: Alert, Awake and Oriented to Time, Place and Person. Appear in moderate distress, affect appropriate Eyes: PERRL, Conjunctiva normal ENT: Oral Mucosa clear moist. Neck: no JVD, no Abnormal Mass Or lumps Cardiovascular: S1 and S2 Present, no Murmur, Respiratory: Bilateral Air entry equal and Decreased, no use of accessory muscle, Clear to Auscultation, no Crackles, no wheezes Abdomen: Bowel Sound present, Soft and no tenderness Skin: no redness, no Rash, no induration Extremities: no Pedal edema, no calf tenderness Neurologic: Grossly no focal neuro deficit. Bilaterally Equal motor strength  Data Reviewed: CBC:  Recent Labs Lab 10/18/16 1634 10/19/16 0330  WBC 5.5 3.3*  NEUTROABS 3.2  --   HGB 9.0* 7.5*  HCT 29.0* 23.6*  MCV 83.1 83.7  PLT 81* 76*   Basic Metabolic Panel:  Recent Labs Lab 10/18/16 1634 10/19/16 0330  NA 134* 139  K 4.4 4.0  CL 102 107  CO2 23 24  GLUCOSE 96 120*  BUN 55* 52*  CREATININE 2.37* 2.17*  CALCIUM 9.4 8.9    Liver Function Tests:  Recent Labs Lab 10/18/16 1634  AST 22  ALT 16*  ALKPHOS 137*  BILITOT 1.1  PROT 4.3*  ALBUMIN  2.4*   No results for input(s): LIPASE, AMYLASE in the last 168 hours. No results for input(s): AMMONIA in the last 168 hours. Coagulation Profile: No results for input(s): INR, PROTIME in the last 168 hours. Cardiac Enzymes: No results for input(s): CKTOTAL, CKMB, CKMBINDEX, TROPONINI in the last 168 hours. BNP (last 3 results) No results for input(s): PROBNP in the last 8760 hours.  CBG: No results for input(s): GLUCAP in the last 168 hours.  Studies: Dg Chest 1 View  Result Date: 10/18/2016 CLINICAL DATA:  Pain after trauma. EXAM: CHEST 1 VIEW COMPARISON:  August 01, 2016 FINDINGS: The heart size and mediastinal contours are within normal limits. Both lungs are clear. The visualized skeletal structures are unremarkable. IMPRESSION: No acute interval change. Electronically Signed   By: Dorise Bullion III M.D   On: 10/18/2016 17:37   Dg Pelvis 1-2 Views  Result Date: 10/18/2016 CLINICAL DATA:  Pain after fall EXAM: PELVIS - 1-2 VIEW COMPARISON:  None. FINDINGS: There is no evidence of pelvic fracture or diastasis. No pelvic bone  lesions are seen. IMPRESSION: Negative. Electronically Signed   By: Dorise Bullion III M.D   On: 10/18/2016 17:35   Ct Head Wo Contrast  Result Date: 10/18/2016 CLINICAL DATA:  Fall in bathroom today. Right supraorbital laceration. History of thrombocytopenia. EXAM: CT HEAD WITHOUT CONTRAST CT MAXILLOFACIAL WITHOUT CONTRAST CT CERVICAL SPINE WITHOUT CONTRAST TECHNIQUE: Multidetector CT imaging of the head, cervical spine, and maxillofacial structures were performed using the standard protocol without intravenous contrast. Multiplanar CT image reconstructions of the cervical spine and maxillofacial structures were also generated. COMPARISON:  PET-CT 02/26/2016. FINDINGS: CT HEAD FINDINGS Brain: There is no evidence of acute intracranial hemorrhage, mass lesion, brain edema or extra-axial fluid collection. There is stable generalized prominence of the ventricles  and subarachnoid spaces consistent with moderate atrophy. There are relatively mild chronic small vessel ischemic changes in the periventricular white matter. There is no CT evidence of acute cortical infarction. Vascular: Intracranial vascular calcifications are present. Skull: Prominent soft tissue swelling in the right supraorbital scalp. No evidence of calvarial fracture. Sinuses/Orbits: Facial findings below. No evidence of orbital hematoma. The mastoid air cells and middle ears are clear. Other: None. CT MAXILLOFACIAL FINDINGS Osseous: No evidence of acute facial fracture. The temporomandibular joints are intact. Orbits: Right supraorbital scalp hematoma. No evidence of orbital hematoma. The globes are intact. The optic nerves and extraocular muscles appear normal. Sinuses: Scattered mucosal thickening in the right maxillary, bilateral ethmoid and bilateral sphenoid sinuses. Possible left sphenoid sinus air-fluid level. Soft tissues: None additional. CT CERVICAL SPINE FINDINGS Alignment: Mild convex right scoliosis. The lateral alignment is normal. Skull base and vertebrae: No evidence of acute fracture or traumatic subluxation. There is multilevel spondylosis, primarily affecting the facet joints, worse on the left. Soft tissues and spinal canal: No prevertebral fluid or swelling. No visible canal hematoma. Disc levels: No evidence of high-grade spinal stenosis or large disc herniation. There is multilevel spondylosis with asymmetric left-sided facet hypertrophy. There is some resulting foraminal narrowing, worst on the left at C3-4. Upper chest: Stable without suspicious findings. Other: None. IMPRESSION: 1. Right supraorbital scalp hematoma. No evidence of calvarial fracture. 2. No acute intracranial findings. 3. No evidence of orbital hematoma, maxillofacial or cervical spine fracture. 4. Paranasal sinus mucosal thickening, likely inflammatory. 5. Cervical spondylosis as described. Electronically Signed    By: Richardean Sale M.D.   On: 10/18/2016 17:10   Ct Cervical Spine Wo Contrast  Result Date: 10/18/2016 CLINICAL DATA:  Fall in bathroom today. Right supraorbital laceration. History of thrombocytopenia. EXAM: CT HEAD WITHOUT CONTRAST CT MAXILLOFACIAL WITHOUT CONTRAST CT CERVICAL SPINE WITHOUT CONTRAST TECHNIQUE: Multidetector CT imaging of the head, cervical spine, and maxillofacial structures were performed using the standard protocol without intravenous contrast. Multiplanar CT image reconstructions of the cervical spine and maxillofacial structures were also generated. COMPARISON:  PET-CT 02/26/2016. FINDINGS: CT HEAD FINDINGS Brain: There is no evidence of acute intracranial hemorrhage, mass lesion, brain edema or extra-axial fluid collection. There is stable generalized prominence of the ventricles and subarachnoid spaces consistent with moderate atrophy. There are relatively mild chronic small vessel ischemic changes in the periventricular white matter. There is no CT evidence of acute cortical infarction. Vascular: Intracranial vascular calcifications are present. Skull: Prominent soft tissue swelling in the right supraorbital scalp. No evidence of calvarial fracture. Sinuses/Orbits: Facial findings below. No evidence of orbital hematoma. The mastoid air cells and middle ears are clear. Other: None. CT MAXILLOFACIAL FINDINGS Osseous: No evidence of acute facial fracture. The temporomandibular joints are intact. Orbits: Right  supraorbital scalp hematoma. No evidence of orbital hematoma. The globes are intact. The optic nerves and extraocular muscles appear normal. Sinuses: Scattered mucosal thickening in the right maxillary, bilateral ethmoid and bilateral sphenoid sinuses. Possible left sphenoid sinus air-fluid level. Soft tissues: None additional. CT CERVICAL SPINE FINDINGS Alignment: Mild convex right scoliosis. The lateral alignment is normal. Skull base and vertebrae: No evidence of acute fracture  or traumatic subluxation. There is multilevel spondylosis, primarily affecting the facet joints, worse on the left. Soft tissues and spinal canal: No prevertebral fluid or swelling. No visible canal hematoma. Disc levels: No evidence of high-grade spinal stenosis or large disc herniation. There is multilevel spondylosis with asymmetric left-sided facet hypertrophy. There is some resulting foraminal narrowing, worst on the left at C3-4. Upper chest: Stable without suspicious findings. Other: None. IMPRESSION: 1. Right supraorbital scalp hematoma. No evidence of calvarial fracture. 2. No acute intracranial findings. 3. No evidence of orbital hematoma, maxillofacial or cervical spine fracture. 4. Paranasal sinus mucosal thickening, likely inflammatory. 5. Cervical spondylosis as described. Electronically Signed   By: Richardean Sale M.D.   On: 10/18/2016 17:10   US Renal  Result Date: 10/18/2016 CLINICAL DATA:  Acute kidney injury, UTI EXAM: RENAL / URINARY TRACT ULTRASOUND COMPLETE COMPARISON:  07/28/2016 FINDINGS: Right Kidney: Length: 14.1 cm. Increased cortical echogenicity. Marked hydronephrosis of the right kidney, grossly unchanged. Left Kidney: Length: 10.5 cm. Increased cortical echogenicity. Moderate severe left hydronephrosis, not seen on the prior study. Bladder: Foley catheter is present in the bladder with a thick wall. Hypoechoic structure posterior to the bladder could represent enlarged prostate gland. IMPRESSION: 1. Echogenic kidneys bilaterally. 2. Marked hydronephrosis of the right kidney grossly unchanged. 3. Moderate severe hydronephrosis of left kidney, new compared to prior study. 4. Thick-walled bladder collapsed around Foley catheter. Enlarged prostate. Electronically Signed   By: Donavan Foil M.D.   On: 10/18/2016 20:59   Dg Knee Complete 4 Views Left  Result Date: 10/18/2016 CLINICAL DATA:  Pain after fall EXAM: LEFT KNEE - COMPLETE 4+ VIEW COMPARISON:  None. FINDINGS:  Degenerative changes most prominent in the medial compartment. No fracture or effusion. IMPRESSION: Degenerative changes. Electronically Signed   By: Dorise Bullion III M.D   On: 10/18/2016 17:34   Ct Renal Stone Study  Result Date: 10/18/2016 CLINICAL DATA:  History of hydronephrosis with worsening renal function and possible UTI EXAM: CT ABDOMEN AND PELVIS WITHOUT CONTRAST TECHNIQUE: Multidetector CT imaging of the abdomen and pelvis was performed following the standard protocol without IV contrast. COMPARISON:  Nuclear medicine scan 03/19/2016, CT 10/24/2014 FINDINGS: Lower chest: Hazy opacities within the bilateral lower lobes could reflect atelectasis or pneumonias. Mild reticular densities likely indicate underlying fibrosis. No pleural effusion. Mild distal esophageal wall thickening similar compared to prior. Small left infrahilar lymph node measuring 1 cm. Hepatobiliary: No focal hepatic abnormality is seen. The gallbladder is surgically absent. There is intra hepatic biliary dilatation and dilated extrahepatic common bile duct similar compared to the previous CT scan. Pancreas: Unremarkable. No pancreatic ductal dilatation or surrounding inflammatory changes. Spleen: The spleen is slightly enlarged at 14 cm. Adrenals/Urinary Tract: Bilateral adrenal glands demonstrate slight nodular thickening but no focal mass. Kidneys are atrophic bilaterally. Markedly dilated right extrarenal pelvis with marked dilatation of the ureter diffusely moderate dilatation of left extra renal pelvis, increased compared to previous CT, however this was noted to be enlarged on nuclear medicine imaging. The left ureter is markedly dilated, similar compared to previous CT scan. There is a 3  mm stone within the mid left kidney. No stones are identified along the course of the left ureter. There is a Foley catheter in the bladder. The bladder wall is very thickened and there is mild soft tissue stranding surrounding the  bladder. A small diverticulum is again present along the right aspect of the collapsed bladder. Stomach/Bowel: The stomach is nonenlarged. No dilated small bowel. There is colon diverticular disease without wall thickening. Vascular/Lymphatic: Multiple calcified phleboliths. Atherosclerosis. The previously noted abdominal pelvic adenopathy on the comparison CT scan has significantly decreased. There are clustered slightly enlarged lymph nodes in the left upper abdominal mesentery. Reproductive: The prostate gland is markedly enlarged. Other: There is no free air or free fluid. Fatty containing ventral hernia. 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 No new suspicious bone lesions. IMPRESSION: 1. Mild reticular density at the bilateral lung bases suggests chronic interstitial changes. There are hazy superimposed opacities which may reflect atelectasis or pneumonia. 2. Markedly dilated right extrarenal pelvis with markedly dilated right ureter similar compared to prior imaging. Moderate dilatation of left extra renal pelvis, slightly increased; there is a very dilated left ureter which appears similar compared to the prior CT. There is a 3 mm stone in the left kidney. There is no stone identified along the course of the dilated distal left ureter. 3. Foley catheter in the bladder. There is marked wall thickening of the bladder, this could relate to a cystitis or neoplasm. Right posterior bladder diverticulum. 4. Post cholecystectomy changes with stable intra and extrahepatic biliary dilatation. 5. Decreased adenopathy within the abdomen pelvis compared to the previous CT scan. Splenomegaly. 6. Markedly enlarged prostate gland. Electronically Signed   By: Donavan Foil M.D.   On: 10/18/2016 23:14   Ct Maxillofacial Wo Contrast  Result Date: 10/18/2016 CLINICAL DATA:  Fall in bathroom today. Right supraorbital laceration. History of thrombocytopenia. EXAM: CT HEAD WITHOUT CONTRAST CT MAXILLOFACIAL  WITHOUT CONTRAST CT CERVICAL SPINE WITHOUT CONTRAST TECHNIQUE: Multidetector CT imaging of the head, cervical spine, and maxillofacial structures were performed using the standard protocol without intravenous contrast. Multiplanar CT image reconstructions of the cervical spine and maxillofacial structures were also generated. COMPARISON:  PET-CT 02/26/2016. FINDINGS: CT HEAD FINDINGS Brain: There is no evidence of acute intracranial hemorrhage, mass lesion, brain edema or extra-axial fluid collection. There is stable generalized prominence of the ventricles and subarachnoid spaces consistent with moderate atrophy. There are relatively mild chronic small vessel ischemic changes in the periventricular white matter. There is no CT evidence of acute cortical infarction. Vascular: Intracranial vascular calcifications are present. Skull: Prominent soft tissue swelling in the right supraorbital scalp. No evidence of calvarial fracture. Sinuses/Orbits: Facial findings below. No evidence of orbital hematoma. The mastoid air cells and middle ears are clear. Other: None. CT MAXILLOFACIAL FINDINGS Osseous: No evidence of acute facial fracture. The temporomandibular joints are intact. Orbits: Right supraorbital scalp hematoma. No evidence of orbital hematoma. The globes are intact. The optic nerves and extraocular muscles appear normal. Sinuses: Scattered mucosal thickening in the right maxillary, bilateral ethmoid and bilateral sphenoid sinuses. Possible left sphenoid sinus air-fluid level. Soft tissues: None additional. CT CERVICAL SPINE FINDINGS Alignment: Mild convex right scoliosis. The lateral alignment is normal. Skull base and vertebrae: No evidence of acute fracture or traumatic subluxation. There is multilevel spondylosis, primarily affecting the facet joints, worse on the left. Soft tissues and spinal canal: No prevertebral fluid or swelling. No visible canal hematoma. Disc levels: No evidence of high-grade spinal  stenosis or large disc  herniation. There is multilevel spondylosis with asymmetric left-sided facet hypertrophy. There is some resulting foraminal narrowing, worst on the left at C3-4. Upper chest: Stable without suspicious findings. Other: None. IMPRESSION: 1. Right supraorbital scalp hematoma. No evidence of calvarial fracture. 2. No acute intracranial findings. 3. No evidence of orbital hematoma, maxillofacial or cervical spine fracture. 4. Paranasal sinus mucosal thickening, likely inflammatory. 5. Cervical spondylosis as described. Electronically Signed   By: Richardean Sale M.D.   On: 10/18/2016 17:10     Scheduled Meds: . Chlorhexidine Gluconate Cloth  6 each Topical Q0600  . docusate sodium  100 mg Oral BID  . feeding supplement (ENSURE ENLIVE)  237 mL Oral TID BM  . feeding supplement (PRO-STAT SUGAR FREE 64)  30 mL Oral BID  . finasteride  5 mg Oral Q breakfast  . ibrutinib  420 mg Oral q morning - 10a  . mirabegron ER  50 mg Oral Daily  . multivitamin  1 tablet Oral BID  . mupirocin ointment  1 application Nasal BID  . pantoprazole  40 mg Oral Daily  . piperacillin-tazobactam (ZOSYN)  IV  2.25 g Intravenous Q6H  . polyvinyl alcohol  1 drop Both Eyes BID  . sodium chloride flush  3 mL Intravenous Q12H  . [START ON 10/21/2016] Vitamin D (Ergocalciferol)  50,000 Units Oral Once per day on Mon Thu   Continuous Infusions: . sodium chloride 125 mL/hr at 10/19/16 1523   PRN Meds: acetaminophen **OR** acetaminophen, albuterol, artificial tears, bisacodyl, menthol-cetylpyridinium, ondansetron **OR** ondansetron (ZOFRAN) IV, oxybutynin, polyethylene glycol, zolpidem  Time spent: 30 minutes  Author: Berle Mull, MD [facility] Pager: [phone] 10/19/2016 4:39 PM  If 7PM-7AM, please contact night-coverage at [email or web address], password [facility]

## 2016-10-19 NOTE — Evaluation (Signed)
Physical Therapy Evaluation Patient Details Name: Daniel [name] MRN: [id number] DOB: 07-15-1919 Today's Date: 10/19/2016   History of Present Illness  Patient is a 82 yo male admitted 10/18/16 following a fall with lacerations on Rt forehead and Lt knee.  Head CT negative per report.  Patient noted to have UTI.     PMH:  indwelling Foley catheter, dementia, anemia, HOH, CHF, HTN  Clinical Impression  Patient presents with problems listed below.  Will benefit from acute PT to maximize functional mobility prior to discharge.  Recommend patient return to SNF for continued therapy.    Follow Up Recommendations SNF;Supervision/Assistance - 24 hour    Equipment Recommendations  None recommended by PT    Recommendations for Other Services       Precautions / Restrictions Precautions Precautions: Fall Restrictions Weight Bearing Restrictions: No      Mobility  Bed Mobility Overal bed mobility: Needs Assistance Bed Mobility: Supine to Sit;Sit to Supine     Supine to sit: Min assist Sit to supine: Min guard   General bed mobility comments: Assist to raise trunk to upright position.  Patient able to sit EOB x 4 minutes with fair balance.  Transfers Overall transfer level: Needs assistance Equipment used: Rolling walker (2 wheeled) Transfers: Sit to/from Stand Sit to Stand: Min assist         General transfer comment: Verbal cues for hand placement.  Assist to rise to standing and for balance.  Patient very weak, shaking in stance.  Able to take steps in place - 5 each foot.  Fatigued quickly and returned to sitting.  Ambulation/Gait             General Gait Details: NT  Stairs            Wheelchair Mobility    Modified Rankin (Stroke Patients Only)       Balance Overall balance assessment: Needs assistance         Standing balance support: Bilateral upper extremity supported Standing balance-Leahy Scale: Poor                              Pertinent Vitals/Pain Pain Assessment: No/denies pain    Home Living Family/patient expects to be discharged to:: Skilled nursing facility                 Additional Comments: Patient reports he was at home pta, and was living by himself.      Prior Function Level of Independence: Needs assistance   Gait / Transfers Assistance Needed: Patient ambulating with RW and using w/c for longer distances (per chart)  ADL's / Homemaking Assistance Needed: Assist with all ADL's.        Hand Dominance   Dominant Hand: Right    Extremity/Trunk Assessment   Upper Extremity Assessment: Generalized weakness;LUE deficits/detail       LUE Deficits / Details: Decreased shoulder ROM to approx 30* flex and abduction - history of injury to shoulder.   Lower Extremity Assessment: Generalized weakness      Cervical / Trunk Assessment: Kyphotic  Communication   Communication: HOH  Cognition Arousal/Alertness: Awake/alert Behavior During Therapy: WFL for tasks assessed/performed Overall Cognitive Status: No family/caregiver present to determine baseline cognitive functioning Area of Impairment: Orientation;Memory Orientation Level: Disoriented to;Situation   Memory: Decreased short-term memory         General Comments: Patient providing contradictory information during conversation.  States he fell in  kitchen.  Then reports he was in bathroom (as is stated in H&P).  States he was at home pta, then talks about rehab at the facility.    General Comments      Exercises     Assessment/Plan    PT Assessment Patient needs continued PT services  PT Problem List Decreased strength;Decreased activity tolerance;Decreased balance;Decreased mobility;Decreased cognition;Decreased knowledge of use of DME          PT Treatment Interventions DME instruction;Gait training;Functional mobility training;Therapeutic activities;Therapeutic exercise;Balance training;Patient/family  education    PT Goals (Current goals can be found in the Care Plan section)  Acute Rehab PT Goals Patient Stated Goal: "To get back to the way I used to be" PT Goal Formulation: With patient Time For Goal Achievement: 11/02/16 Potential to Achieve Goals: Good    Frequency Min 2X/week   Barriers to discharge        Co-evaluation               End of Session Equipment Utilized During Treatment: Gait belt Activity Tolerance: Patient limited by fatigue Patient left: in bed;with call bell/phone within reach;with bed alarm set;with SCD's reapplied           Time: [id number] PT Time Calculation (min) (ACUTE ONLY): 23 min   Charges:   PT Evaluation $PT Eval Moderate Complexity: 1 Procedure PT Treatments $Therapeutic Activity: 8-22 mins   PT G Codes:        Despina Pole 11-02-2016, 7:04 PM Carita Pian. Sanjuana Kava, [place] Pager [phone]

## 2016-10-19 NOTE — Consult Note (Signed)
Urology Consult   Physician requesting consult: Dr. Ivor Costa  Reason for consult: Bilateral hydronephrosis  History of Present Illness: Daniel [name] is a 82 y.o. patient followed by Dr. Franchot Gallo for chronic urinary retention managed with an indwelling urethral catheter (last changed 2 weeks ago) and known chronic bilateral hydronephrosis presumably related to BPH and lymphoma (followed by Dr. Heath Lark). His hydronephrosis has been followed conservatively considering his frailty and advanced age and desire to avoid aggressive intervention.  His baseline Cr is 1.3.  He presented to the hospital after a recent fall that resulted in a head injury with other peripheral soft tissue injuries.  He was noted to have an elevated Cr of 2.37 upon admission and a renal ultrasound demonstrated stable moderate right hydronephrosis as previously noted but with suggestion of worsening left hydronephrosis.  His catheter was confirmed to be in appropriate position and irrigated well.  He has made adequate urine output overnight.  A CT scan of the abdomen and pelvis was performed without contrast to evaluate his hydronephrosis further and to rule out a new ureteral calculus (he has a history of stones) or other new cause of ureteral obstruction.  On review of his prior CT images (November 2016), he actually does have a history of left hydronephrosis with hydroureter that appears to be chronic although not as prominent as the right side.  He has been afebrile without other systemic signs of infection.  He has been orthostatic and it is unclear if this precipitated his initial fall or has been a consequence of sequelae after his fall.  He denies flank pain or abdominal pain.  His only complaint is progressive difficulty swallowing that he says has been developing even before his fall.  Past Medical History:  Diagnosis Date  . Anemia   . B12 deficiency 06/06/2016  . BPH (benign prostatic hyperplasia)   . CKD  (chronic kidney disease), stage III   . Congenital malformation of spine    T12  . Cough for last 2 years   occasional white sputum  . Diverticulosis   . GERD with esophagitis   . History of kidney stones 2013  . HOH (hard of hearing)   . Hx of hydronephrosis   . Hydronephrosis 10/27/2014  . NHL (non-Hodgkin's lymphoma) ([place])    nhl dx 5/09;  . Pneumonia   . Prostate enlargement   . Sleep apnea   . Small cell B-cell lymphoma [facility])     Past Surgical History:  Procedure Laterality Date  . BOTOX INJECTION N/A 07/15/2013   Procedure: BOTOX INJECTION;  Surgeon: Milus Banister, MD;  Location: [facility];  Service: Endoscopy;  Laterality: N/A;  . CHOLECYSTECTOMY  yrs ago  . ESOPHAGOGASTRODUODENOSCOPY (EGD) WITH PROPOFOL N/A 07/15/2013   Procedure: ESOPHAGOGASTRODUODENOSCOPY (EGD) WITH PROPOFOL;  Surgeon: Milus Banister, MD;  Location: [facility];  Service: Endoscopy;  Laterality: N/A;  botox injection  . EYE SURGERY  yrs ago   Implants bilateral  . INGUINAL HERNIA REPAIR  yrs ago   left  . PROSTATE SURGERY  yrs ago    Medications:  Home meds:  Current Meds  Medication Sig  . albuterol (PROVENTIL) (2.5 MG/3ML) 0.083% nebulizer solution Take 3 mLs (2.5 mg total) by nebulization every 6 (six) hours as needed for wheezing or shortness of breath.  . Amino Acids-Protein Hydrolys (FEEDING SUPPLEMENT, PRO-STAT SUGAR FREE 64,) LIQD Take 30 mLs by mouth 2 (two) times daily.  . benzonatate (TESSALON) 100 MG capsule Take by  mouth 3 (three) times daily as needed for cough.  . bisacodyl (DULCOLAX) 10 MG suppository Place 10 mg rectally daily as needed for moderate constipation.  . Cholecalciferol (VITAMIN D3) 50000 units CAPS Take 1 capsule by mouth 2 (two) times a week.  Marland Kitchen dextromethorphan (DELSYM) 30 MG/5ML liquid Take 60 mg by mouth as needed for cough.  . docusate sodium (COLACE) 100 MG capsule Take 100 mg by mouth 2 (two) times daily.  . famotidine (PEPCID) 10 MG tablet Take 1 tablet  (10 mg total) by mouth daily.  . finasteride (PROSCAR) 5 MG tablet Take 5 mg by mouth daily with breakfast.   . glucosamine-chondroitin 500-400 MG tablet Take 1 tablet by mouth every morning.  . Hypromellose (ARTIFICIAL TEARS OP) Apply to eye as needed.  . Hypromellose (GENTEAL MILD TO MODERATE OP) Place 1 drop into both eyes 2 (two) times daily.  Marland Kitchen ibrutinib (IMBRUVICA) 140 MG capsul Take 420 mg by mouth every morning.  Marland Kitchen levofloxacin (LEVAQUIN) 500 MG tablet Take 500 mg by mouth daily. Started on 10-14-16. Therapy for 7 days  . mirabegron ER (MYRBETRIQ) 50 MG TB24 tablet Take 50 mg by mouth daily.  . Multiple Vitamins-Minerals (ICAPS AREDS 2) CAPS Take 1 capsule by mouth 2 (two) times daily.  Marland Kitchen oxybutynin (DITROPAN) 5 MG tablet Take 5 mg by mouth every 12 (twelve) hours as needed for bladder spasms.  . pantoprazole (PROTONIX) 40 MG tablet Take 40 mg by mouth daily.  . phenazopyridine (PYRIDIUM) 100 MG tablet Take 100 mg by mouth 3 (three) times daily as needed (bladder spasms).  . polyethylene glycol (MIRALAX / GLYCOLAX) packet Take 17 g by mouth daily as needed for mild constipation or moderate constipation.  Marland Kitchen PRESCRIPTION MEDICATION Take 120 mLs by mouth 4 (four) times daily. Medpass 2.0  . Sodium Phosphates (FLEET ENEMA RE) Place rectally as needed.    Scheduled Meds: . Chlorhexidine Gluconate Cloth  6 each Topical Q0600  . docusate sodium  100 mg Oral BID  . famotidine  10 mg Oral Daily  . feeding supplement (PRO-STAT SUGAR FREE 64)  30 mL Oral BID  . finasteride  5 mg Oral Q breakfast  . ibrutinib  420 mg Oral q morning - 10a  . mirabegron ER  50 mg Oral Daily  . multivitamin  1 tablet Oral BID  . mupirocin ointment  1 application Nasal BID  . pantoprazole  40 mg Oral Daily  . piperacillin-tazobactam (ZOSYN)  IV  2.25 g Intravenous Q6H  . polyvinyl alcohol  1 drop Both Eyes BID  . sodium chloride  500 mL Intravenous Once  . sodium chloride flush  3 mL Intravenous Q12H  . [START  ON 10/21/2016] Vitamin D (Ergocalciferol)  50,000 Units Oral Once per day on Mon Thu   Continuous Infusions: . sodium chloride     PRN Meds:.acetaminophen **OR** acetaminophen, albuterol, artificial tears, bisacodyl, ondansetron **OR** ondansetron (ZOFRAN) IV, oxybutynin, polyethylene glycol, zolpidem  Allergies: No Known Allergies  Family History  Problem Relation Age of Onset  . Tuberculosis Mother   . Blindness Father     Social History:  reports that he quit smoking about 56 years ago. His smoking use included Pipe. He has a 0.75 pack-year smoking history. He has never used smokeless tobacco. He reports that he drinks about 0.6 oz of alcohol per week . He reports that he does not use drugs.  ROS: A complete review of systems was performed.  All systems are negative except for pertinent  findings as noted.  Physical Exam:  Vital signs in last 24 hours: Temp:  [96.8 F (36 C)-98 F (36.7 C)] 98 F (36.7 C) (11/11 0339) Pulse Rate:  [78-95] 78 (11/11 0400) Resp:  [16-28] 28 (11/11 0400) BP: (59-113)/(32-67) 102/64 (11/11 0400) SpO2:  [96 %-100 %] 98 % (11/11 0400) Weight:  [53.4 kg (117 lb 12.8 oz)-54 kg (119 lb)] 53.9 kg (118 lb 12.8 oz) (11/11 0339) Constitutional:  Alert and oriented, No acute distress Cardiovascular: Regular rate and rhythm, No JVD, ecchymosis of his head and extremities. Respiratory: Normal respiratory effort, Lungs clear bilaterally GI: Abdomen is soft, nontender, nondistended, no abdominal masses.  He has a large umbilical hernia that is reducible. Genitourinary: No CVAT. Normal male phallus, testes are descended bilaterally and non-tender and without masses, scrotum is normal in appearance without lesions or masses, perineum is normal on inspection.  He has an indwelling catheter draining grossly clear urine into a leg bag. Lymphatic: No inguinal or femoral lymphadenopathy Neurologic: Grossly intact, no focal deficits Psychiatric: Normal mood and  affect  Laboratory Data:   Recent Labs  10/18/16 1634 10/19/16 0330  WBC 5.5 3.3*  HGB 9.0* 7.5*  HCT 29.0* 23.6*  PLT 81* 76*     Recent Labs  10/18/16 1634 10/19/16 0330  NA 134* 139  K 4.4 4.0  CL 102 107  GLUCOSE 96 120*  BUN 55* 52*  CALCIUM 9.4 8.9  CREATININE 2.37* 2.17*     Results for orders placed or performed during the hospital encounter of 10/18/16 (from the past 24 hour(s))  CBC with Differential/Platelet     Status: Abnormal   Collection Time: 10/18/16  4:34 PM  Result Value Ref Range   WBC 5.5 4.0 - 10.5 K/uL   RBC 3.49 (L) 4.22 - 5.81 MIL/uL   Hemoglobin 9.0 (L) 13.0 - 17.0 g/dL   HCT 29.0 (L) 39.0 - 52.0 %   MCV 83.1 78.0 - 100.0 fL   MCH 25.8 (L) 26.0 - 34.0 pg   MCHC 31.0 30.0 - 36.0 g/dL   RDW 17.0 (H) 11.5 - 15.5 %   Platelets 81 (L) 150 - 400 K/uL   Neutrophils Relative % 60 %   Lymphocytes Relative 30 %   Monocytes Relative 10 %   Eosinophils Relative 0 %   Basophils Relative 0 %   Neutro Abs 3.2 1.7 - 7.7 K/uL   Lymphs Abs 1.7 0.7 - 4.0 K/uL   Monocytes Absolute 0.6 0.1 - 1.0 K/uL   Eosinophils Absolute 0.0 0.0 - 0.7 K/uL   Basophils Absolute 0.0 0.0 - 0.1 K/uL   RBC Morphology ACANTHOCYTES    WBC Morphology ATYPICAL LYMPHOCYTES   Comprehensive metabolic panel     Status: Abnormal   Collection Time: 10/18/16  4:34 PM  Result Value Ref Range   Sodium 134 (L) 135 - 145 mmol/L   Potassium 4.4 3.5 - 5.1 mmol/L   Chloride 102 101 - 111 mmol/L   CO2 23 22 - 32 mmol/L   Glucose, Bld 96 65 - 99 mg/dL   BUN 55 (H) 6 - 20 mg/dL   Creatinine, Ser 2.37 (H) 0.61 - 1.24 mg/dL   Calcium 9.4 8.9 - 10.3 mg/dL   Total Protein 4.3 (L) 6.5 - 8.1 g/dL   Albumin 2.4 (L) 3.5 - 5.0 g/dL   AST 22 15 - 41 U/L   ALT 16 (L) 17 - 63 U/L   Alkaline Phosphatase 137 (H) 38 - 126 U/L  Total Bilirubin 1.1 0.3 - 1.2 mg/dL   GFR calc non Af Amer 22 (L) >60 mL/min   GFR calc Af Amer 25 (L) >60 mL/min   Anion gap 9 5 - 15  Brain natriuretic peptide      Status: None   Collection Time: 10/18/16  4:34 PM  Result Value Ref Range   B Natriuretic Peptide 47.3 0.0 - 100.0 pg/mL  I-stat troponin, ED     Status: None   Collection Time: 10/18/16  4:38 PM  Result Value Ref Range   Troponin i, poc 0.01 0.00 - 0.08 ng/mL   Comment 3          Urinalysis, Routine w reflex microscopic (not at [facility])     Status: Abnormal   Collection Time: 10/18/16  6:00 PM  Result Value Ref Range   Color, Urine YELLOW YELLOW   APPearance TURBID (A) CLEAR   Specific Gravity, Urine 1.014 1.005 - 1.030   pH 6.0 5.0 - 8.0   Glucose, UA NEGATIVE NEGATIVE mg/dL   Hgb urine dipstick LARGE (A) NEGATIVE   Bilirubin Urine NEGATIVE NEGATIVE   Ketones, ur NEGATIVE NEGATIVE mg/dL   Protein, ur 100 (A) NEGATIVE mg/dL   Nitrite NEGATIVE NEGATIVE   Leukocytes, UA LARGE (A) NEGATIVE  Urine microscopic-add on     Status: Abnormal   Collection Time: 10/18/16  6:00 PM  Result Value Ref Range   Squamous Epithelial / LPF 0-5 (A) NONE SEEN   WBC, UA TOO NUMEROUS TO COUNT 0 - 5 WBC/hpf   RBC / HPF 6-30 0 - 5 RBC/hpf   Bacteria, UA MANY (A) NONE SEEN  MRSA PCR Screening     Status: Abnormal   Collection Time: 10/18/16  9:45 PM  Result Value Ref Range   MRSA by PCR POSITIVE (A) NEGATIVE  Basic metabolic panel     Status: Abnormal   Collection Time: 10/19/16  3:30 AM  Result Value Ref Range   Sodium 139 135 - 145 mmol/L   Potassium 4.0 3.5 - 5.1 mmol/L   Chloride 107 101 - 111 mmol/L   CO2 24 22 - 32 mmol/L   Glucose, Bld 120 (H) 65 - 99 mg/dL   BUN 52 (H) 6 - 20 mg/dL   Creatinine, Ser 2.17 (H) 0.61 - 1.24 mg/dL   Calcium 8.9 8.9 - 10.3 mg/dL   GFR calc non Af Amer 24 (L) >60 mL/min   GFR calc Af Amer 28 (L) >60 mL/min   Anion gap 8 5 - 15  CBC     Status: Abnormal   Collection Time: 10/19/16  3:30 AM  Result Value Ref Range   WBC 3.3 (L) 4.0 - 10.5 K/uL   RBC 2.82 (L) 4.22 - 5.81 MIL/uL   Hemoglobin 7.5 (L) 13.0 - 17.0 g/dL   HCT 23.6 (L) 39.0 - 52.0 %   MCV 83.7 78.0  - 100.0 fL   MCH 26.6 26.0 - 34.0 pg   MCHC 31.8 30.0 - 36.0 g/dL   RDW 17.0 (H) 11.5 - 15.5 %   Platelets 76 (L) 150 - 400 K/uL   Recent Results (from the past 240 hour(s))  MRSA PCR Screening     Status: Abnormal   Collection Time: 10/18/16  9:45 PM  Result Value Ref Range Status   MRSA by PCR POSITIVE (A) NEGATIVE Final    Comment:        The GeneXpert MRSA Assay (FDA approved for NASAL specimens only), is one component  of a comprehensive MRSA colonization surveillance program. It is not intended to diagnose MRSA infection nor to guide or monitor treatment for MRSA infections. RESULT CALLED TO, READ BACK BY AND VERIFIED WITH: S BARNHILL,RN @0140  10/19/16 MKELLY,MLT     Renal Function:  Recent Labs  10/18/16 1634 10/19/16 0330  CREATININE 2.37* 2.17*   Estimated Creatinine Clearance: 15.5 mL/min (by C-G formula based on SCr of 2.17 mg/dL (H)).  Radiologic Imaging: Dg Chest 1 View  Result Date: 10/18/2016 CLINICAL DATA:  Pain after trauma. EXAM: CHEST 1 VIEW COMPARISON:  August 01, 2016 FINDINGS: The heart size and mediastinal contours are within normal limits. Both lungs are clear. The visualized skeletal structures are unremarkable. IMPRESSION: No acute interval change. Electronically Signed   By: Dorise Bullion III M.D   On: 10/18/2016 17:37   Dg Pelvis 1-2 Views  Result Date: 10/18/2016 CLINICAL DATA:  Pain after fall EXAM: PELVIS - 1-2 VIEW COMPARISON:  None. FINDINGS: There is no evidence of pelvic fracture or diastasis. No pelvic bone lesions are seen. IMPRESSION: Negative. Electronically Signed   By: Dorise Bullion III M.D   On: 10/18/2016 17:35   Ct Head Wo Contrast  Result Date: 10/18/2016 CLINICAL DATA:  Fall in bathroom today. Right supraorbital laceration. History of thrombocytopenia. EXAM: CT HEAD WITHOUT CONTRAST CT MAXILLOFACIAL WITHOUT CONTRAST CT CERVICAL SPINE WITHOUT CONTRAST TECHNIQUE: Multidetector CT imaging of the head, cervical spine, and  maxillofacial structures were performed using the standard protocol without intravenous contrast. Multiplanar CT image reconstructions of the cervical spine and maxillofacial structures were also generated. COMPARISON:  PET-CT 02/26/2016. FINDINGS: CT HEAD FINDINGS Brain: There is no evidence of acute intracranial hemorrhage, mass lesion, brain edema or extra-axial fluid collection. There is stable generalized prominence of the ventricles and subarachnoid spaces consistent with moderate atrophy. There are relatively mild chronic small vessel ischemic changes in the periventricular white matter. There is no CT evidence of acute cortical infarction. Vascular: Intracranial vascular calcifications are present. Skull: Prominent soft tissue swelling in the right supraorbital scalp. No evidence of calvarial fracture. Sinuses/Orbits: Facial findings below. No evidence of orbital hematoma. The mastoid air cells and middle ears are clear. Other: None. CT MAXILLOFACIAL FINDINGS Osseous: No evidence of acute facial fracture. The temporomandibular joints are intact. Orbits: Right supraorbital scalp hematoma. No evidence of orbital hematoma. The globes are intact. The optic nerves and extraocular muscles appear normal. Sinuses: Scattered mucosal thickening in the right maxillary, bilateral ethmoid and bilateral sphenoid sinuses. Possible left sphenoid sinus air-fluid level. Soft tissues: None additional. CT CERVICAL SPINE FINDINGS Alignment: Mild convex right scoliosis. The lateral alignment is normal. Skull base and vertebrae: No evidence of acute fracture or traumatic subluxation. There is multilevel spondylosis, primarily affecting the facet joints, worse on the left. Soft tissues and spinal canal: No prevertebral fluid or swelling. No visible canal hematoma. Disc levels: No evidence of high-grade spinal stenosis or large disc herniation. There is multilevel spondylosis with asymmetric left-sided facet hypertrophy. There is  some resulting foraminal narrowing, worst on the left at C3-4. Upper chest: Stable without suspicious findings. Other: None. IMPRESSION: 1. Right supraorbital scalp hematoma. No evidence of calvarial fracture. 2. No acute intracranial findings. 3. No evidence of orbital hematoma, maxillofacial or cervical spine fracture. 4. Paranasal sinus mucosal thickening, likely inflammatory. 5. Cervical spondylosis as described. Electronically Signed   By: Richardean Sale M.D.   On: 10/18/2016 17:10   Ct Cervical Spine Wo Contrast  Result Date: 10/18/2016 CLINICAL DATA:  Fall in bathroom  today. Right supraorbital laceration. History of thrombocytopenia. EXAM: CT HEAD WITHOUT CONTRAST CT MAXILLOFACIAL WITHOUT CONTRAST CT CERVICAL SPINE WITHOUT CONTRAST TECHNIQUE: Multidetector CT imaging of the head, cervical spine, and maxillofacial structures were performed using the standard protocol without intravenous contrast. Multiplanar CT image reconstructions of the cervical spine and maxillofacial structures were also generated. COMPARISON:  PET-CT 02/26/2016. FINDINGS: CT HEAD FINDINGS Brain: There is no evidence of acute intracranial hemorrhage, mass lesion, brain edema or extra-axial fluid collection. There is stable generalized prominence of the ventricles and subarachnoid spaces consistent with moderate atrophy. There are relatively mild chronic small vessel ischemic changes in the periventricular white matter. There is no CT evidence of acute cortical infarction. Vascular: Intracranial vascular calcifications are present. Skull: Prominent soft tissue swelling in the right supraorbital scalp. No evidence of calvarial fracture. Sinuses/Orbits: Facial findings below. No evidence of orbital hematoma. The mastoid air cells and middle ears are clear. Other: None. CT MAXILLOFACIAL FINDINGS Osseous: No evidence of acute facial fracture. The temporomandibular joints are intact. Orbits: Right supraorbital scalp hematoma. No evidence  of orbital hematoma. The globes are intact. The optic nerves and extraocular muscles appear normal. Sinuses: Scattered mucosal thickening in the right maxillary, bilateral ethmoid and bilateral sphenoid sinuses. Possible left sphenoid sinus air-fluid level. Soft tissues: None additional. CT CERVICAL SPINE FINDINGS Alignment: Mild convex right scoliosis. The lateral alignment is normal. Skull base and vertebrae: No evidence of acute fracture or traumatic subluxation. There is multilevel spondylosis, primarily affecting the facet joints, worse on the left. Soft tissues and spinal canal: No prevertebral fluid or swelling. No visible canal hematoma. Disc levels: No evidence of high-grade spinal stenosis or large disc herniation. There is multilevel spondylosis with asymmetric left-sided facet hypertrophy. There is some resulting foraminal narrowing, worst on the left at C3-4. Upper chest: Stable without suspicious findings. Other: None. IMPRESSION: 1. Right supraorbital scalp hematoma. No evidence of calvarial fracture. 2. No acute intracranial findings. 3. No evidence of orbital hematoma, maxillofacial or cervical spine fracture. 4. Paranasal sinus mucosal thickening, likely inflammatory. 5. Cervical spondylosis as described. Electronically Signed   By: Richardean Sale M.D.   On: 10/18/2016 17:10   US Renal  Result Date: 10/18/2016 CLINICAL DATA:  Acute kidney injury, UTI EXAM: RENAL / URINARY TRACT ULTRASOUND COMPLETE COMPARISON:  07/28/2016 FINDINGS: Right Kidney: Length: 14.1 cm. Increased cortical echogenicity. Marked hydronephrosis of the right kidney, grossly unchanged. Left Kidney: Length: 10.5 cm. Increased cortical echogenicity. Moderate severe left hydronephrosis, not seen on the prior study. Bladder: Foley catheter is present in the bladder with a thick wall. Hypoechoic structure posterior to the bladder could represent enlarged prostate gland. IMPRESSION: 1. Echogenic kidneys bilaterally. 2. Marked  hydronephrosis of the right kidney grossly unchanged. 3. Moderate severe hydronephrosis of left kidney, new compared to prior study. 4. Thick-walled bladder collapsed around Foley catheter. Enlarged prostate. Electronically Signed   By: Donavan Foil M.D.   On: 10/18/2016 20:59   Dg Knee Complete 4 Views Left  Result Date: 10/18/2016 CLINICAL DATA:  Pain after fall EXAM: LEFT KNEE - COMPLETE 4+ VIEW COMPARISON:  None. FINDINGS: Degenerative changes most prominent in the medial compartment. No fracture or effusion. IMPRESSION: Degenerative changes. Electronically Signed   By: Dorise Bullion III M.D   On: 10/18/2016 17:34   Ct Renal Stone Study  Result Date: 10/18/2016 CLINICAL DATA:  History of hydronephrosis with worsening renal function and possible UTI EXAM: CT ABDOMEN AND PELVIS WITHOUT CONTRAST TECHNIQUE: Multidetector CT imaging of the abdomen and pelvis  was performed following the standard protocol without IV contrast. COMPARISON:  Nuclear medicine scan 03/19/2016, CT 10/24/2014 FINDINGS: Lower chest: Hazy opacities within the bilateral lower lobes could reflect atelectasis or pneumonias. Mild reticular densities likely indicate underlying fibrosis. No pleural effusion. Mild distal esophageal wall thickening similar compared to prior. Small left infrahilar lymph node measuring 1 cm. Hepatobiliary: No focal hepatic abnormality is seen. The gallbladder is surgically absent. There is intra hepatic biliary dilatation and dilated extrahepatic common bile duct similar compared to the previous CT scan. Pancreas: Unremarkable. No pancreatic ductal dilatation or surrounding inflammatory changes. Spleen: The spleen is slightly enlarged at 14 cm. Adrenals/Urinary Tract: Bilateral adrenal glands demonstrate slight nodular thickening but no focal mass. Kidneys are atrophic bilaterally. Markedly dilated right extrarenal pelvis with marked dilatation of the ureter diffusely moderate dilatation of left extra renal  pelvis, increased compared to previous CT, however this was noted to be enlarged on nuclear medicine imaging. The left ureter is markedly dilated, similar compared to previous CT scan. There is a 3 mm stone within the mid left kidney. No stones are identified along the course of the left ureter. There is a Foley catheter in the bladder. The bladder wall is very thickened and there is mild soft tissue stranding surrounding the bladder. A small diverticulum is again present along the right aspect of the collapsed bladder. Stomach/Bowel: The stomach is nonenlarged. No dilated small bowel. There is colon diverticular disease without wall thickening. Vascular/Lymphatic: Multiple calcified phleboliths. Atherosclerosis. The previously noted abdominal pelvic adenopathy on the comparison CT scan has significantly decreased. There are clustered slightly enlarged lymph nodes in the left upper abdominal mesentery. Reproductive: The prostate gland is markedly enlarged. Other: There is no free air or free fluid. Fatty containing ventral hernia. 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 No new suspicious bone lesions. IMPRESSION: 1. Mild reticular density at the bilateral lung bases suggests chronic interstitial changes. There are hazy superimposed opacities which may reflect atelectasis or pneumonia. 2. Markedly dilated right extrarenal pelvis with markedly dilated right ureter similar compared to prior imaging. Moderate dilatation of left extra renal pelvis, slightly increased; there is a very dilated left ureter which appears similar compared to the prior CT. There is a 3 mm stone in the left kidney. There is no stone identified along the course of the dilated distal left ureter. 3. Foley catheter in the bladder. There is marked wall thickening of the bladder, this could relate to a cystitis or neoplasm. Right posterior bladder diverticulum. 4. Post cholecystectomy changes with stable intra and extrahepatic biliary  dilatation. 5. Decreased adenopathy within the abdomen pelvis compared to the previous CT scan. Splenomegaly. 6. Markedly enlarged prostate gland. Electronically Signed   By: Donavan Foil M.D.   On: 10/18/2016 23:14   Ct Maxillofacial Wo Contrast  Result Date: 10/18/2016 CLINICAL DATA:  Fall in bathroom today. Right supraorbital laceration. History of thrombocytopenia. EXAM: CT HEAD WITHOUT CONTRAST CT MAXILLOFACIAL WITHOUT CONTRAST CT CERVICAL SPINE WITHOUT CONTRAST TECHNIQUE: Multidetector CT imaging of the head, cervical spine, and maxillofacial structures were performed using the standard protocol without intravenous contrast. Multiplanar CT image reconstructions of the cervical spine and maxillofacial structures were also generated. COMPARISON:  PET-CT 02/26/2016. FINDINGS: CT HEAD FINDINGS Brain: There is no evidence of acute intracranial hemorrhage, mass lesion, brain edema or extra-axial fluid collection. There is stable generalized prominence of the ventricles and subarachnoid spaces consistent with moderate atrophy. There are relatively mild chronic small vessel ischemic changes in the periventricular white  matter. There is no CT evidence of acute cortical infarction. Vascular: Intracranial vascular calcifications are present. Skull: Prominent soft tissue swelling in the right supraorbital scalp. No evidence of calvarial fracture. Sinuses/Orbits: Facial findings below. No evidence of orbital hematoma. The mastoid air cells and middle ears are clear. Other: None. CT MAXILLOFACIAL FINDINGS Osseous: No evidence of acute facial fracture. The temporomandibular joints are intact. Orbits: Right supraorbital scalp hematoma. No evidence of orbital hematoma. The globes are intact. The optic nerves and extraocular muscles appear normal. Sinuses: Scattered mucosal thickening in the right maxillary, bilateral ethmoid and bilateral sphenoid sinuses. Possible left sphenoid sinus air-fluid level. Soft tissues: None  additional. CT CERVICAL SPINE FINDINGS Alignment: Mild convex right scoliosis. The lateral alignment is normal. Skull base and vertebrae: No evidence of acute fracture or traumatic subluxation. There is multilevel spondylosis, primarily affecting the facet joints, worse on the left. Soft tissues and spinal canal: No prevertebral fluid or swelling. No visible canal hematoma. Disc levels: No evidence of high-grade spinal stenosis or large disc herniation. There is multilevel spondylosis with asymmetric left-sided facet hypertrophy. There is some resulting foraminal narrowing, worst on the left at C3-4. Upper chest: Stable without suspicious findings. Other: None. IMPRESSION: 1. Right supraorbital scalp hematoma. No evidence of calvarial fracture. 2. No acute intracranial findings. 3. No evidence of orbital hematoma, maxillofacial or cervical spine fracture. 4. Paranasal sinus mucosal thickening, likely inflammatory. 5. Cervical spondylosis as described. Electronically Signed   By: Richardean Sale M.D.   On: 10/18/2016 17:10    I independently reviewed the above imaging studies.  Impression/Recommendation 1. Chronic urinary retention: Continue urethral catheter drainage.  Catheter is in the correct position by imaging and is draining well. 2. Bilateral hydroureteronephrosis/AKI: He has chronic bilateral hydroureteronephrosis.  On review of his CT scan, his right sided dilation is stable from prior imaging studies.  While his left side may indicate slight progression, it is not markedly changed from his prior CT in November 2016.  He does not have an obstructing stone or other cause of acute complete obstruction.  Considering he is making adequate urine, he is clearly not completely obstructed.  His renal function is gradually improving indicating that his AKI may be pre-renal.  Furthermore, considering his advanced age, overall frailty, and other medical problems along with his long standing desire to avoid  aggressive urologic intervention, I would recommend continued monitoring of his hydronephrosis in the absence of infection or clear signs of systemic infection.  3. Possible UTI:  He is on Zosyn now with a urine culture pending.  Although he has been orthostatic overnight, he has no signs that specifically suggest a systemic infection.  Although not unreasonable to continue antibiotic therapy if there is concern that his non-specific symptoms might be related to UTI, my suspicion is that his bacteriuria is likely chronic asymptomatic bacteriuria due to his indwelling catheter rather that a clinical UTI.  I will leave it up to the primary team and their clinical impression as to whether to continue antibiotic therapy.  Will continue to follow.  Vivien Barretto,LES 10/19/2016, 8:10 AM    Pryor Curia MD  CC: Dr. Ivor Costa

## 2016-10-19 NOTE — Progress Notes (Addendum)
Initial Nutrition Assessment  DOCUMENTATION CODES:  Severe malnutrition in context of chronic illness   Pt meets criteria for SEVERE MALNUTRITION in the context of Chronic Illness as evidenced by Loss of >10% bw in 6 months and severe fat depletion.  INTERVENTION:  Ensure Enlive po TID, each supplement provides 350 kcal and 20 grams of protein  Meal Requests  NUTRITION DIAGNOSIS:  Inadequate oral intake related to poor appetite, acute illness, dysphagia as evidenced by loss of >10% bw in 6 months  GOAL:  Patient will meet greater than or equal to 90% of their needs  MONITOR:  PO intake, Supplement acceptance, Labs, I & O's  REASON FOR ASSESSMENT:  Malnutrition Screening Tool    ASSESSMENT:  82 y/o male PMHx NHL, indwelling Cath, Small cell B-Lymphoma, HOH, BPH, GERD, CKD3, CHF, Achlasia. Presented after falling at SNF after hard BM. Worked up for UTI and admitted for observation.    Spoke with patient and step son. Step son provides most of history. He reports that pt was not eating well at his SNF because he didn't like the food. The pt himself confirmed this and stated how the food had very little seasoning. He also said he had a poor appetite. His poor vision and functionality further impeded his intake.   Pt typically only eats his breakfast well. The other two meals are poor. Joslyn Hy says patient is more lethargic. He recently stopped receiving PT at the SNF and now has become a long term resident. He believes the decrease in activity and increased lethargy has impacted pt's appetite as well.   Pt did not sound to be on any therapeutic diet at SNF. He had drawer of salt/pepper. He drank 2 Ensures a day. Pt says he was recently placed on a MVI as well.   In regards to dysphagia, the stepson reports he has had this his whole life. "90% of the time when he eats, he gets choked". He says this is because the patient often "Crams" food in his mouth and is impulsive. However,  says  "when pt eats slowly, he does not choke 95% of the time". To reduce how much the patient could eat at one time, they gave the pt baby utensils which helped. SLP is following and he has been cleared for Reg diet, TL  Pt denies Diarrhea, Nausea, Vomiting. He has not had a BM in 2-3 days, but he has not been eating much. He typically does not have a BM each day. He takes a laxative/stool softener regularly.   Wt wise, Stepson gives UBW as 140 lbs,. Per documented weights, he has lost 17 lbs (12.5% bw) x 6 months and close to 30 lbs in 1 year.   Patient agreeable to continue Ensure TID. RD took meal preferences.   NFPE: Pt has Mod-severe muscle/fat wasting, though sarcopenia likely age related. He has Bilateral ankle swelling and Stepson reports he has always had this. Subjectively, he appears taller than what is recorded.   Labs: CKD, WBC: 3.3, h/h:7.5/23.6 Medications:   Recent Labs Lab 10/18/16 1634 10/19/16 0330  NA 134* 139  K 4.4 4.0  CL 102 107  CO2 23 24  BUN 55* 52*  CREATININE 2.37* 2.17*  CALCIUM 9.4 8.9  GLUCOSE 96 120*   Diet Order:  Diet Heart Room service appropriate? Yes; Fluid consistency: Thin  Skin: Widespread bruising and many skin tears  To face/knee/should.   Last BM:  11/10 (pt reports 2-3 days ago)  Height:  Ht  Readings from Last 1 Encounters:  10/18/16 5\' 5"  (1.651 m)   Weight:  Wt Readings from Last 1 Encounters:  10/19/16 118 lb 12.8 oz (53.9 kg)   Wt Readings from Last 10 Encounters:  10/19/16 118 lb 12.8 oz (53.9 kg)  10/18/16 119 lb (54 kg)  10/01/16 123 lb (55.8 kg)  07/30/16 145 lb 4.5 oz (65.9 kg)  07/10/16 123 lb (55.8 kg)  06/18/16 123 lb (55.8 kg)  06/14/16 123 lb (55.8 kg)  06/06/16 125 lb 11.2 oz (57 kg)  05/03/16 135 lb (61.2 kg)  04/07/16 135 lb (61.2 kg)   Ideal Body Weight:  61.82 kg  BMI:  Body mass index is 19.77 kg/m.  Estimated Nutritional Needs:  Kcal:  1500-1700 (28-32 kcal/kg bw) Protein:  65-75 g Pro (1.2-1.4  g/kg bw) Fluid:  1.5-1.7 L fluid  EDUCATION NEEDS:  No education needs identified at this time  Burtis Junes RD, LDN, [facility] Nutrition Pager: [phone] 10/19/2016 12:34 PM

## 2016-10-20 DIAGNOSIS — A4189 Other specified sepsis: Secondary | ICD-10-CM

## 2016-10-20 LAB — RENAL FUNCTION PANEL
Albumin: 1.9 g/dL — ABNORMAL LOW (ref 3.5–5.0)
Anion gap: 8 (ref 5–15)
BUN: 40 mg/dL — ABNORMAL HIGH (ref 6–20)
CHLORIDE: 111 mmol/L (ref 101–111)
CO2: 21 mmol/L — AB (ref 22–32)
CREATININE: 1.94 mg/dL — AB (ref 0.61–1.24)
Calcium: 8.3 mg/dL — ABNORMAL LOW (ref 8.9–10.3)
GFR calc Af Amer: 32 mL/min — ABNORMAL LOW (ref >60)
GFR calc non Af Amer: 28 mL/min — ABNORMAL LOW (ref >60)
GLUCOSE: 129 mg/dL — AB (ref 65–99)
Phosphorus: 2.7 mg/dL (ref 2.5–4.6)
Potassium: 3.6 mmol/L (ref 3.5–5.1)
Sodium: 140 mmol/L (ref 135–145)

## 2016-10-20 LAB — CBC WITH DIFFERENTIAL/PLATELET
Basophils Absolute: 0 10*3/uL (ref 0.0–0.1)
Basophils Relative: 0 %
Eosinophils Absolute: 0 10*3/uL (ref 0.0–0.7)
Eosinophils Relative: 0 %
HCT: 25.4 % — ABNORMAL LOW (ref 39.0–52.0)
Hemoglobin: 7.8 g/dL — ABNORMAL LOW (ref 13.0–17.0)
LYMPHS ABS: 1.2 10*3/uL (ref 0.7–4.0)
LYMPHS PCT: 41 %
MCH: 26 pg (ref 26.0–34.0)
MCHC: 30.7 g/dL (ref 30.0–36.0)
MCV: 84.7 fL (ref 78.0–100.0)
MONO ABS: 0.2 10*3/uL (ref 0.1–1.0)
MONOS PCT: 8 %
Neutro Abs: 1.4 10*3/uL — ABNORMAL LOW (ref 1.7–7.7)
Neutrophils Relative %: 50 %
PLATELETS: 69 10*3/uL — AB (ref 150–400)
RBC: 3 MIL/uL — ABNORMAL LOW (ref 4.22–5.81)
RDW: 17.6 % — AB (ref 11.5–15.5)
WBC: 2.9 10*3/uL — ABNORMAL LOW (ref 4.0–10.5)

## 2016-10-20 LAB — URINE CULTURE

## 2016-10-20 NOTE — Progress Notes (Signed)
Patient ID: Daniel [name], male   DOB: July 06, 1919, 82 y.o.   MRN: [id number]    Subjective: Pt resting comfortably.  Urine draining well overnight.  No fever overnight.  Objective: Vital signs in last 24 hours: Temp:  [97.6 F (36.4 C)-98.1 F (36.7 C)] 98.1 F (36.7 C) (11/12 0757) Pulse Rate:  [79-94] 86 (11/12 0447) Resp:  [14-27] 20 (11/12 0447) BP: (87-117)/(51-90) 108/63 (11/12 0447) SpO2:  [93 %-100 %] 94 % (11/12 0447) Weight:  [56.2 kg (124 lb)] 56.2 kg (124 lb) (11/12 0436)  Intake/Output from previous day: 11/11 0701 - 11/12 0700 In: 819.7 [I.V.:769.7; IV Piggyback:50] Out: 1200 [Urine:1200] Intake/Output this shift: No intake/output data recorded.  Physical Exam:  General: Resting comfortably. GU: Urine clear and draining well.  Lab Results:  Recent Labs  10/18/16 1634 10/19/16 0330  HGB 9.0* 7.5*  HCT 29.0* 23.6*   CBC Latest Ref Rng & Units 10/19/2016 10/18/2016 08/01/2016  WBC 4.0 - 10.5 K/uL 3.3(L) 5.5 3.2(L)  Hemoglobin 13.0 - 17.0 g/dL 7.5(L) 9.0(L) 7.9(L)  Hematocrit 39.0 - 52.0 % 23.6(L) 29.0(L) 25.6(L)  Platelets 150 - 400 K/uL 76(L) 81(L) 91(L)     BMET  Recent Labs  10/18/16 1634 10/19/16 0330  NA 134* 139  K 4.4 4.0  CL 102 107  CO2 23 24  GLUCOSE 96 120*  BUN 55* 52*  CREATININE 2.37* 2.17*  CALCIUM 9.4 8.9     Studies/Results:   Assessment/Plan: 1) Bilateral hydroureteronephrosis: Renal function gradually improving.  Catheter draining well.  Continue catheter drainage.  Will avoid any intervention for hydronephrosis considering advanced age and desire to avoid aggressive intervention.   LOS: 1 day   Maira Christon,LES 10/20/2016, 9:32 AM

## 2016-10-20 NOTE — Progress Notes (Signed)
[facility] Progress Note  Patient: Daniel [name] [id number]   PCP: Wenda Low, MD DOB: [birth year]   DOA: 10/18/2016   DOS: 10/20/2016   Date of Service: the patient was seen and examined on 10/20/2016  Brief hospital course: Pt. with PMH of indwelling cath, NHL, small cell B-lymphoma, right-sided hydronephrosis, poor hearing wearing hearing aids, BPH, GERD, CKD-III, dCHF, mild bronchiectasis with chronic cough; admitted on 10/18/2016, with complaint of fall, was found to have Sepsis due to UTI. Currently further plan is continue antibiotics.  Assessment and Plan: 1.Fall: Etiology is not clear. CT head is negative for intracranial abnormalities. No focal neurological findings, less likely to have stroke. Likely vasovagal syncope, generalized weakness , deconditioning and possible UTI.   2. Sepsis due to UTI (urinary tract infection):  Patient has positive urinalysis with large amount of leukocyte, not completely sure if this is colonization or new UTI. Given his worsening renal function and fall, will treat presumably as UTI. Patient had positive urinalysis with Pseudomonas with pensensitivity on 07/28/16. -IV Zosyn Negative blood culture and multiple species in urine culture. irrigation of Foley catheter  Hx of ACHALASIA:  Patient complains about difficulty swallowing -SLP recommends regular diet with thin liquids  Bronchiectasis without acute exacerbation: Patient has chronic cough. He has been follow-up by pulmonologist. Patient was seen by pulmonology, NP Parrett on 10/18/16. Pt had neg sputum for AFB . Per clinic note, "Previous PET in March 2017 showed decreased left central mesenteric mass. + LLL nodule was hypermetabolic -this was felt to be inflammatory/infectious in nature from recurrent PNA ? Aspiration"  -will continue Pepcid and Protonix for GERD -prn albuterol nebulizer -Mucinex for cough -Follow-up with pulmonology  AoCKD-III and hx of hydronephrosis:  Baseline Cre is 1.3-1.6, pt's Cre is 2.37 and BUN 55 on admission. Likely due to prerenal secondary to dehydration. Pt has hx of R hydronephrosis. With worsening per ultrasound this time - IVF as above - Follow up renal function by BMP -Highly appreciate Dr. 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 Platelet 81. Patient has bruises, but no active bleeding. Mental status is normal-->No TTP. -f/u by CBC  Chronic diastolic CHF (congestive heart failure) ([place]): 2-D echo on 08/01/16 showed EF 60-65% with grade 1 diastolic dysfunction. Patient is not on diuretics. No leg edema or JVD, CHF is compensated on admission. -normal BNP  BPH (benign prostatic hyperplasia): -continue Proscar, oxybutynin, Myrbetriq  GERD (gastroesophageal reflux disease): -Pepcid and Protonix  NHL and small cell B lynphoma: pt is followed by Dr. Alvy Bimler. Currently on imbruvica -Continue home [place] -f/u with Dr. Alvy Bimler  Skin tear and bruises: -wound care consult  Pain management: When necessary Tylenol Activity: Consulted physical therapy Bowel regimen: last BM 10/18/2016 Diet: Cardiac diet DVT Prophylaxis: subcutaneous Heparin  Advance goals of care discussion: DNR/DNI  Family Communication: no family was present at bedside, at the time of interview.  Disposition:  Discharge to SNF. Expected discharge date: 10/22/2016,   Consultants: Urologist Procedures: Ultrasound renal  Antibiotics: Anti-infectives    Start     Dose/Rate Route Frequency Ordered Stop   10/19/16 0300  piperacillin-tazobactam (ZOSYN) IVPB 2.25 g     2.25 g 100 mL/hr over 30 Minutes Intravenous Every 6 hours 10/18/16 1944     10/18/16 1930  piperacillin-tazobactam (ZOSYN) IVPB 2.25 g     2.25 g 100 mL/hr over 30 Minutes Intravenous  Once 10/18/16 1927 10/18/16 2209   10/18/16 1845  cefTRIAXone (ROCEPHIN) 1  g in dextrose 5 % 50 mL IVPB  Status:   Discontinued     1 g 100 mL/hr over 30 Minutes Intravenous  Once 10/18/16 1831 10/18/16 1925     Subjective: Denies any acute complaint. No abdominal pain or nausea and vomiting. Continues to feel fatigued.  Objective: Physical Exam: Vitals:   10/20/16 0436 10/20/16 0447 10/20/16 0757 10/20/16 1135  BP: (!) 87/54 108/63    Pulse: 81 86    Resp: 15 20    Temp: 97.6 F (36.4 C)  98.1 F (36.7 C) 97.9 F (36.6 C)  TempSrc: Axillary  Axillary Axillary  SpO2: 93% 94%    Weight: 56.2 kg (124 lb)     Height:        Intake/Output Summary (Last 24 hours) at 10/20/16 1624 Last data filed at 10/20/16 1300  Gross per 24 hour  Intake              225 ml  Output              650 ml  Net             -425 ml   Filed Weights   10/18/16 2113 10/19/16 0339 10/20/16 0436  Weight: 53.4 kg (117 lb 12.8 oz) 53.9 kg (118 lb 12.8 oz) 56.2 kg (124 lb)    General: Alert, Awake and Oriented to Time, Place and Person. Appear in moderate distress, affect appropriate Eyes: PERRL, Conjunctiva normal ENT: Oral Mucosa clear moist. Neck: no JVD, no Abnormal Mass Or lumps Cardiovascular: S1 and S2 Present, no Murmur, Respiratory: Bilateral Air entry equal and Decreased, no use of accessory muscle, Clear to Auscultation, no Crackles, no wheezes Abdomen: Bowel Sound present, Soft and no tenderness Skin: no redness, no Rash, no induration Extremities: no Pedal edema, no calf tenderness Neurologic: Grossly no focal neuro deficit. Bilaterally Equal motor strength  Data Reviewed: CBC:  Recent Labs Lab 10/18/16 1634 10/19/16 0330 10/20/16 1015  WBC 5.5 3.3* 2.9*  NEUTROABS 3.2  --  1.4*  HGB 9.0* 7.5* 7.8*  HCT 29.0* 23.6* 25.4*  MCV 83.1 83.7 84.7  PLT 81* 76* 69*   Basic Metabolic Panel:  Recent Labs Lab 10/18/16 1634 10/19/16 0330 10/20/16 1015  NA 134* 139 140  K 4.4 4.0 3.6  CL 102 107 111  CO2 23 24 21*  GLUCOSE 96 120* 129*  BUN 55* 52* 40*  CREATININE 2.37* 2.17* 1.94*    CALCIUM 9.4 8.9 8.3*  PHOS  --   --  2.7    Liver Function Tests:  Recent Labs Lab 10/18/16 1634 10/20/16 1015  AST 22  --   ALT 16*  --   ALKPHOS 137*  --   BILITOT 1.1  --   PROT 4.3*  --   ALBUMIN 2.4* 1.9*   No results for input(s): LIPASE, AMYLASE in the last 168 hours. No results for input(s): AMMONIA in the last 168 hours. Coagulation Profile: No results for input(s): INR, PROTIME in the last 168 hours. Cardiac Enzymes: No results for input(s): CKTOTAL, CKMB, CKMBINDEX, TROPONINI in the last 168 hours. BNP (last 3 results) No results for input(s): PROBNP in the last 8760 hours.  CBG: No results for input(s): GLUCAP in the last 168 hours.  Studies: No results found.   Scheduled Meds: . Chlorhexidine Gluconate Cloth  6 each Topical Q0600  . docusate sodium  100 mg Oral BID  . feeding supplement (ENSURE ENLIVE)  237 mL Oral TID BM  .  feeding supplement (PRO-STAT SUGAR FREE 64)  30 mL Oral BID  . finasteride  5 mg Oral Q breakfast  . ibrutinib  420 mg Oral q morning - 10a  . mirabegron ER  50 mg Oral Daily  . multivitamin  1 tablet Oral BID  . mupirocin ointment  1 application Nasal BID  . pantoprazole  40 mg Oral Daily  . piperacillin-tazobactam (ZOSYN)  IV  2.25 g Intravenous Q6H  . polyvinyl alcohol  1 drop Both Eyes BID  . sodium chloride flush  3 mL Intravenous Q12H  . [START ON 10/21/2016] Vitamin D (Ergocalciferol)  50,000 Units Oral Once per day on Mon Thu   Continuous Infusions: . sodium chloride 125 mL/hr at 10/20/16 0925   PRN Meds: acetaminophen **OR** acetaminophen, albuterol, artificial tears, bisacodyl, menthol-cetylpyridinium, ondansetron **OR** ondansetron (ZOFRAN) IV, oxybutynin, polyethylene glycol, zolpidem  Time spent: 30 minutes  Author: Berle Mull, MD [facility] Pager: [phone] 10/20/2016 4:24 PM  If 7PM-7AM, please contact night-coverage at [email or web address], password [facility]

## 2016-10-20 NOTE — Consult Note (Signed)
[facility] Nurse wound consult note Reason for Consult: Skin tears sustained during fall at facility. Wound type:Traumatic Pressure Ulcer POA: No Measurement:Right shoulder: Grade 2 skin tear measuring 3.5cm x 3cm x 0.1cm (avulsion with skin rolled back to cover majority of wound.  10% of wound remains open with red, moist wound bed.)  Left knee:  Grade 2 skin tear measuring 3cm x 1.5cm x 0.2cm, see above.  20% of wound bed evident, red, moist wound bed with serosanguinous exudate in a scant amount evident.  Left LE:  Grade 1 skin tear measuring 2cm x 0.1cm x 0.1cm with near complete approximation, small amount of red wound bed evident with no drainage. Wound bed:As described above Drainage (amount, consistency, odor) As described above Periwound: intact, areas of ecchymosis consistent with fall and others that are healing Dressing procedure/placement/frequency: I will provide Nursing with conservative care guidance via the Orders for placement of white petrolatum gauze over the injuries covered with dry gauze and topped with silicone foam.  Additionally, I have provided pressure redistribution heel boots for his use in bed and a pressure redistribution chair pad for his use when OOB in a chair to prevent pressure injuries. [place] nursing team will not follow, but will remain available to this patient, the nursing and medical teams.  Please re-consult if needed. Thanks, Maudie Flakes, MSN, RN, [place], Arther Abbott  Pager# [phone]

## 2016-10-21 ENCOUNTER — Inpatient Hospital Stay (HOSPITAL_COMMUNITY): Payer: Medicare Other

## 2016-10-21 LAB — CBC WITH DIFFERENTIAL/PLATELET
BASOS ABS: 0 10*3/uL (ref 0.0–0.1)
Basophils Relative: 0 %
EOS PCT: 0 %
Eosinophils Absolute: 0 10*3/uL (ref 0.0–0.7)
HEMATOCRIT: 23.4 % — AB (ref 39.0–52.0)
Hemoglobin: 7.3 g/dL — ABNORMAL LOW (ref 13.0–17.0)
Lymphocytes Relative: 43 %
Lymphs Abs: 1.4 10*3/uL (ref 0.7–4.0)
MCH: 26.7 pg (ref 26.0–34.0)
MCHC: 31.2 g/dL (ref 30.0–36.0)
MCV: 85.7 fL (ref 78.0–100.0)
MONO ABS: 0.3 10*3/uL (ref 0.1–1.0)
Monocytes Relative: 9 %
NEUTROS PCT: 48 %
Neutro Abs: 1.5 10*3/uL — ABNORMAL LOW (ref 1.7–7.7)
PLATELETS: 54 10*3/uL — AB (ref 150–400)
RBC: 2.73 MIL/uL — AB (ref 4.22–5.81)
RDW: 18 % — AB (ref 11.5–15.5)
WBC: 3.2 10*3/uL — AB (ref 4.0–10.5)

## 2016-10-21 LAB — RENAL FUNCTION PANEL
ALBUMIN: 1.7 g/dL — AB (ref 3.5–5.0)
Anion gap: 7 (ref 5–15)
BUN: 42 mg/dL — AB (ref 6–20)
CALCIUM: 7.8 mg/dL — AB (ref 8.9–10.3)
CO2: 22 mmol/L (ref 22–32)
CREATININE: 1.89 mg/dL — AB (ref 0.61–1.24)
Chloride: 112 mmol/L — ABNORMAL HIGH (ref 101–111)
GFR calc Af Amer: 33 mL/min — ABNORMAL LOW (ref >60)
GFR, EST NON AFRICAN AMERICAN: 29 mL/min — AB (ref >60)
GLUCOSE: 88 mg/dL (ref 65–99)
PHOSPHORUS: 2.4 mg/dL — AB (ref 2.5–4.6)
POTASSIUM: 3.4 mmol/L — AB (ref 3.5–5.1)
SODIUM: 141 mmol/L (ref 135–145)

## 2016-10-21 LAB — CBC
HCT: 25.9 % — ABNORMAL LOW (ref 39.0–52.0)
Hemoglobin: 7.9 g/dL — ABNORMAL LOW (ref 13.0–17.0)
MCH: 26.3 pg (ref 26.0–34.0)
MCHC: 30.5 g/dL (ref 30.0–36.0)
MCV: 86.3 fL (ref 78.0–100.0)
PLATELETS: 45 10*3/uL — AB (ref 150–400)
RBC: 3 MIL/uL — ABNORMAL LOW (ref 4.22–5.81)
RDW: 18.2 % — AB (ref 11.5–15.5)
WBC: 3.1 10*3/uL — AB (ref 4.0–10.5)

## 2016-10-21 NOTE — Progress Notes (Signed)
Pharmacy Antibiotic Note  Daniel [name] is a 82 y.o. male admitted on 10/18/2016 s/p fall.   Pharmacy was consulted for Zosyn dosing for UTI.  Patient has a history of Pseudomonas infection and currently has an indwelling catheter that was recently changed.  He has AKI and is also immunosuppressed. SCr down 2.37>>1.89, CrCl ~19.5 ml/min  -Afebrile, WBC was WNL but decrreased to 3.1k  UCx x2 multiple species/Final Bcx 's ordered today 11/13   Plan: - Continue Zosyn 2.25gm IV Q6H - Monitor renal fxn, clinical progress, micro data  Height: 5\' 5"  (165.1 cm) Weight: 130 lb 4.8 oz (59.1 kg) IBW/kg (Calculated) : 61.5  Temp (24hrs), Avg:98.1 F (36.7 C), Min:97.7 F (36.5 C), Max:98.5 F (36.9 C)   Recent Labs Lab 10/18/16 1634 10/19/16 0330 10/20/16 1015 10/21/16 0439 10/21/16 1044  WBC 5.5 3.3* 2.9* 3.2* 3.1*  CREATININE 2.37* 2.17* 1.94* 1.89*  --     Estimated Creatinine Clearance: 19.5 mL/min (by C-G formula based on SCr of 1.89 mg/dL (H)).    No Known Allergies  Antimicrobials this admission:  Zosyn 11/10 >>  Dose adjustments this admission:  N/A  Microbiology results:  11/10 UCx - multiple species/suggest recollect/Final 11/11 UCx - multiple species/suggest recollect/Final 11/10 MRSA PCR: positive 11/13 BCx x2: sent   Nicole Cella, RPh Clinical Pharmacist Pager: [phone] 10/21/2016, 5:39 PM

## 2016-10-21 NOTE — Progress Notes (Signed)
[facility] Progress Note  Patient: Daniel [name] [id number]   PCP: Wenda Low, MD DOB: [birth year]   DOA: 10/18/2016   DOS: 10/21/2016   Date of Service: the patient was seen and examined on 10/21/2016  Brief hospital course: Pt. with PMH of indwelling cath, NHL, small cell B-lymphoma, right-sided hydronephrosis, poor hearing wearing hearing aids, BPH, GERD, CKD-III, dCHF, mild bronchiectasis with chronic cough; admitted on 10/18/2016, with complaint of fall, was found to have Sepsis due to UTI. Currently further plan is continue antibiotics.  Assessment and Plan: 1.Fall: Etiology is not clear. CT head is negative for intracranial abnormalities. No focal neurological findings, less likely to have stroke. Likely vasovagal syncope, generalized weakness , deconditioning and possible UTI.   2. Sepsis due to UTI (urinary tract infection):  Patient has positive urinalysis with large amount of leukocyte, not completely sure if this is colonization or new UTI. Given his worsening renal function and fall, will treat presumably as UTI. Patient had positive urinalysis with Pseudomonas with pensensitivity on 07/28/16. -IV Zosyn Negative blood culture and multiple species in urine culture. irrigation of Foley catheter Get CT chest to rule out any abnormality  Hx of ACHALASIA:  Patient complains about difficulty swallowing -SLP recommends regular diet with thin liquids  Bronchiectasis without acute exacerbation: Patient has chronic cough. He has been follow-up by pulmonologist. Patient was seen by pulmonology, NP [place] on 10/18/16. Pt had neg sputum for AFB . Per clinic note, "Previous PET in March 2017 showed decreased left central mesenteric mass. + LLL nodule was hypermetabolic -this was felt to be inflammatory/infectious in nature from recurrent PNA ? Aspiration"  -will continue Pepcid and Protonix for GERD -prn albuterol nebulizer -Mucinex for cough -Follow-up with  pulmonology  AoCKD-III and hx of hydronephrosis: Baseline Cre is 1.3-1.6, pt's Cre is 2.37 and BUN 55 on admission. Likely due to prerenal secondary to dehydration. Pt has hx of R hydronephrosis. With worsening per ultrasound this time - IVF as above - Follow up renal function by BMP - Highly appreciate Dr. 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 Platelet 81. Patient has bruises, but no active bleeding. Mental status is normal-->No TTP. -f/u by CBC  Chronic diastolic CHF (congestive heart failure) ([place]): 2-D echo on 08/01/16 showed EF 60-65% with grade 1 diastolic dysfunction. Patient is not on diuretics. No leg edema or JVD, CHF is compensated on admission. -normal BNP  BPH (benign prostatic hyperplasia): -continue Proscar, oxybutynin, Myrbetriq  GERD (gastroesophageal reflux disease): -Pepcid and Protonix  NHL and small cell B lynphoma: pt is followed by Dr. Alvy Bimler. Currently on imbruvica -stop Imbruvica -f/u with Dr. Alvy Bimler, Recommend to discuss with palliative care, will consult for goals of care discussion  Skin tear and bruises: -wound care consult  Pain management: When necessary Tylenol Activity: Consulted physical therapy Bowel regimen: last BM 10/18/2016 Diet: Cardiac diet DVT Prophylaxis: subcutaneous Heparin  Advance goals of care discussion: DNR/DNI  Family Communication: no family was present at bedside, at the time of interview.  Disposition:  Discharge to SNF. Expected discharge date: 10/24/2016, negative blood culture  Consultants: Urologist Procedures: Ultrasound renal  Antibiotics: Anti-infectives    Start     Dose/Rate Route Frequency Ordered Stop   10/19/16 0300  piperacillin-tazobactam (ZOSYN) IVPB 2.25 g     2.25 g 100 mL/hr over 30 Minutes Intravenous Every 6 hours 10/18/16 1944     10/18/16 1930  piperacillin-tazobactam (ZOSYN) IVPB 2.25 g  2.25 g 100 mL/hr  over 30 Minutes Intravenous  Once 10/18/16 1927 10/18/16 2209   10/18/16 1845  cefTRIAXone (ROCEPHIN) 1 g in dextrose 5 % 50 mL IVPB  Status:  Discontinued     1 g 100 mL/hr over 30 Minutes Intravenous  Once 10/18/16 1831 10/18/16 1925     Subjective: Feeling better. Has increased cough and chest pain no shortness of breath. No nausea no vomiting as well.  Objective: Physical Exam: Vitals:   10/21/16 0733 10/21/16 1111 10/21/16 1205 10/21/16 1605  BP:   111/61 109/65  Pulse:   81 77  Resp:   (!) 22 (!) 22  Temp: 98.2 F (36.8 C) 97.7 F (36.5 C)    TempSrc: Axillary Axillary    SpO2:   98% 92%  Weight:      Height:        Intake/Output Summary (Last 24 hours) at 10/21/16 1900 Last data filed at 10/21/16 1853  Gross per 24 hour  Intake              240 ml  Output             1178 ml  Net             -938 ml   Filed Weights   10/19/16 0339 10/20/16 0436 10/21/16 0443  Weight: 53.9 kg (118 lb 12.8 oz) 56.2 kg (124 lb) 59.1 kg (130 lb 4.8 oz)    General: Alert, Awake and Oriented to Time, Place and Person. Appear in moderate distress, affect appropriate Eyes: PERRL, Conjunctiva normal ENT: Oral Mucosa clear moist. Neck: no JVD, no Abnormal Mass Or lumps Cardiovascular: S1 and S2 Present, no Murmur, Respiratory: Bilateral Air entry equal and Decreased, no use of accessory muscle, Bilateral Crackles, no wheezes Abdomen: Bowel Sound present, Soft and no tenderness Skin: no redness, no Rash, no induration Extremities: no Pedal edema, no calf tenderness Neurologic: Grossly no focal neuro deficit. Bilaterally Equal motor strength  Data Reviewed: CBC:  Recent Labs Lab 10/18/16 1634 10/19/16 0330 10/20/16 1015 10/21/16 0439 10/21/16 1044  WBC 5.5 3.3* 2.9* 3.2* 3.1*  NEUTROABS 3.2  --  1.4* 1.5*  --   HGB 9.0* 7.5* 7.8* 7.3* 7.9*  HCT 29.0* 23.6* 25.4* 23.4* 25.9*  MCV 83.1 83.7 84.7 85.7 86.3  PLT 81* 76* 69* 54* 45*   Basic Metabolic Panel:  Recent Labs Lab  10/18/16 1634 10/19/16 0330 10/20/16 1015 10/21/16 0439  NA 134* 139 140 141  K 4.4 4.0 3.6 3.4*  CL 102 107 111 112*  CO2 23 24 21* 22  GLUCOSE 96 120* 129* 88  BUN 55* 52* 40* 42*  CREATININE 2.37* 2.17* 1.94* 1.89*  CALCIUM 9.4 8.9 8.3* 7.8*  PHOS  --   --  2.7 2.4*    Liver Function Tests:  Recent Labs Lab 10/18/16 1634 10/20/16 1015 10/21/16 0439  AST 22  --   --   ALT 16*  --   --   ALKPHOS 137*  --   --   BILITOT 1.1  --   --   PROT 4.3*  --   --   ALBUMIN 2.4* 1.9* 1.7*   No results for input(s): LIPASE, AMYLASE in the last 168 hours. No results for input(s): AMMONIA in the last 168 hours. Coagulation Profile: No results for input(s): INR, PROTIME in the last 168 hours. Cardiac Enzymes: No results for input(s): CKTOTAL, CKMB, CKMBINDEX, TROPONINI in the last 168 hours. BNP (last 3 results)  No results for input(s): PROBNP in the last 8760 hours.  CBG: No results for input(s): GLUCAP in the last 168 hours.  Studies: No results found.   Scheduled Meds: . Chlorhexidine Gluconate Cloth  6 each Topical Q0600  . docusate sodium  100 mg Oral BID  . feeding supplement (ENSURE ENLIVE)  237 mL Oral TID BM  . feeding supplement (PRO-STAT SUGAR FREE 64)  30 mL Oral BID  . finasteride  5 mg Oral Q breakfast  . mirabegron ER  50 mg Oral Daily  . multivitamin  1 tablet Oral BID  . mupirocin ointment  1 application Nasal BID  . pantoprazole  40 mg Oral Daily  . piperacillin-tazobactam (ZOSYN)  IV  2.25 g Intravenous Q6H  . polyvinyl alcohol  1 drop Both Eyes BID  . sodium chloride flush  3 mL Intravenous Q12H  . Vitamin D (Ergocalciferol)  50,000 Units Oral Once per day on Mon Thu   Continuous Infusions: . sodium chloride 1,000 mL (10/21/16 1136)   PRN Meds: acetaminophen **OR** acetaminophen, albuterol, artificial tears, bisacodyl, menthol-cetylpyridinium, ondansetron **OR** ondansetron (ZOFRAN) IV, oxybutynin, polyethylene glycol, zolpidem  Time spent: 30  minutes  Author: Berle Mull, MD [facility] Pager: [phone] 10/21/2016 7:00 PM  If 7PM-7AM, please contact night-coverage at [email or web address], password [facility]

## 2016-10-21 NOTE — NC FL2 (Signed)
[place] LEVEL OF CARE SCREENING TOOL     IDENTIFICATION  Patient Name: Daniel [name] Birthdate: 11/30/1919 Sex: male Admission Date (Current Location): 10/18/2016  [facility] and [place] Number:  Herbalist and Address:  [facility], [place] [address] [place], [place] [address]      Provider Number: [id number]  Attending Physician Name and Address:  Lavina Hamman, MD  Relative Name and Phone Number:       Current Level of Care: Hospital Recommended Level of Care: [place] Prior Approval Number:    Date Approved/Denied:   PASRR Number: [id number]  Discharge Plan: SNF    Current Diagnoses: Patient Active Problem List   Diagnosis Date Noted  . Syncope 10/18/2016  . Fall 10/18/2016  . BPH (benign prostatic hyperplasia) 10/18/2016  . GERD (gastroesophageal reflux disease) 10/18/2016  . AKI (acute kidney injury) ([place]) 07/28/2016  . Hypotension 07/28/2016  . Chronic diastolic CHF (congestive heart failure) ([place]) 07/28/2016  . Facial laceration   . UTI (lower urinary tract infection)   . UTI (urinary tract infection) 06/15/2016  . Acute renal failure ([place]) 06/14/2016  . Acute renal failure (ARF) ([place]) 06/14/2016  . Bilateral leg edema 06/07/2016  . Pancytopenia, acquired ([place]) 06/06/2016  . B12 deficiency 06/06/2016  . Acute respiratory failure ([place]) 03/19/2016  . Protein-calorie malnutrition, severe 03/15/2016  . Hypoxia 03/14/2016  . Lesion of left lung 03/07/2016  . Protein-calorie malnutrition, moderate ([place]) 03/07/2016  . Thrombocytopenia ([place]) 06/01/2015  . Hernia, inguinal, right 06/01/2015  . Essential hypertension 06/01/2015  . Acute renal failure superimposed on stage 3 chronic kidney disease ([place]) 01/10/2015  . 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 Preventive measure 11/14/2014  . Hydronephrosis, right 10/27/2014  . Cellulitis of arm, right 02/11/2014  . Bronchiectasis without acute  exacerbation ([place]) 06/09/2013  . Hydroureteronephrosis 05/12/2012  . Lymphoma, small lymphocytic ([place]) 11/10/2011  . 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 CHRONIC AIRWAY OBSTRUCTION NEC 09/11/2009    Orientation RESPIRATION BLADDER Height & Weight     Self, Time, Situation, Place  Normal Incontinent, Indwelling catheter Weight: 59.1 kg (130 lb 4.8 oz) Height:  5\' 5"  (165.1 cm)  BEHAVIORAL SYMPTOMS/MOOD NEUROLOGICAL BOWEL NUTRITION STATUS   (NONE)  (NONE) Incontinent Diet (DYS 1)  AMBULATORY STATUS COMMUNICATION OF NEEDS Skin   Extensive Assist Verbally Normal                       Personal Care Assistance Level of Assistance  Bathing, Feeding, Dressing Bathing Assistance: Limited assistance Feeding assistance: Independent Dressing Assistance: Limited assistance     Functional Limitations Info  Sight, Hearing, Speech Sight Info: Adequate Hearing Info: Impaired Speech Info: Adequate    SPECIAL CARE FACTORS FREQUENCY  PT (By licensed PT), OT (By licensed OT)     PT Frequency: 5/week OT Frequency: 5/week            Contractures Contractures Info: Not present    Additional Factors Info  Code Status, Allergies, Isolation Precautions Code Status Info: DNR Allergies Info: NKDA     Isolation Precautions Info: MRSA     Current Medications (10/21/2016):  This is the current hospital active medication list Current Facility-Administered Medications  Medication Dose Route Frequency Provider Last Rate Last Dose  . 0.9 %  sodium chloride infusion   Intravenous Continuous Lavina Hamman, MD 125 mL/hr at 10/21/16 1136 1,000 mL at 10/21/16 1136  . acetaminophen (TYLENOL) tablet 650 mg  650 mg Oral Q6H PRN Ivor Costa, MD       Or  . acetaminophen (TYLENOL) suppository 650 mg  650 mg Rectal Q6H PRN Ivor Costa, MD      . albuterol (PROVENTIL) (2.5 MG/3ML) 0.083% nebulizer solution 2.5 mg  2.5 mg Nebulization Q4H PRN Ivor Costa, MD      . artificial tears (LACRILUBE) ophthalmic ointment    Both Eyes QHS PRN Ivor Costa, MD      . bisacodyl (DULCOLAX) suppository 10 mg  10 mg Rectal Daily PRN Ivor Costa, MD      . Chlorhexidine Gluconate Cloth 2 % PADS 6 each  6 each Topical Q0600 Ivor Costa, MD   6 each at 10/20/16 [phone]  . docusate sodium (COLACE) capsule 100 mg  100 mg Oral BID Ivor Costa, MD   100 mg at 10/21/16 0844  . feeding supplement (ENSURE ENLIVE) (ENSURE ENLIVE) liquid 237 mL  237 mL Oral TID BM Lavina Hamman, MD   237 mL at 10/21/16 1400  . feeding supplement (PRO-STAT SUGAR FREE 64) liquid 30 mL  30 mL Oral BID Ivor Costa, MD   30 mL at 10/21/16 1000  . finasteride (PROSCAR) tablet 5 mg  5 mg Oral Q breakfast Ivor Costa, MD   5 mg at 10/21/16 0844  . menthol-cetylpyridinium (CEPACOL) lozenge 3 mg  1 lozenge Oral PRN Lavina Hamman, MD      . mirabegron ER [facility]) tablet 50 mg  50 mg Oral Daily Ivor Costa, MD   50 mg at 10/21/16 0844  . multivitamin (PROSIGHT) tablet 1 tablet  1 tablet Oral BID Ivor Costa, MD   1 tablet at 10/21/16 0844  . mupirocin ointment (BACTROBAN) 2 % 1 application  1 application Nasal BID Ivor Costa, MD   1 application at [phone]  . ondansetron (ZOFRAN) tablet 4 mg  4 mg Oral Q6H PRN Ivor Costa, MD       Or  . ondansetron [facility]) injection 4 mg  4 mg Intravenous Q6H PRN Ivor Costa, MD      . oxybutynin (DITROPAN) tablet 5 mg  5 mg Oral Q12H PRN Ivor Costa, MD      . pantoprazole (PROTONIX) EC tablet 40 mg  40 mg Oral Daily Ivor Costa, MD   40 mg at 10/21/16 0843  . piperacillin-tazobactam (ZOSYN) IVPB 2.25 g  2.25 g Intravenous Q6H Tyrone Apple, RPH   2.25 g at 10/21/16 1615  . polyethylene glycol (MIRALAX / GLYCOLAX) packet 17 g  17 g Oral Daily PRN Ivor Costa, MD      . polyvinyl alcohol (LIQUIFILM TEARS) 1.4 % ophthalmic solution 1 drop  1 drop Both Eyes BID Ivor Costa, MD   1 drop at 10/21/16 [phone]  . sodium chloride flush (NS) 0.9 % injection 3 mL  3 mL Intravenous Q12H Ivor Costa, MD   3 mL at 10/20/16 1000  . Vitamin D (Ergocalciferol) (DRISDOL)  capsule 50,000 Units  50,000 Units Oral Once per day on Mon Thu Ivor Costa, MD   50,000 Units at 10/21/16 0844  . zolpidem (AMBIEN) tablet 5 mg  5 mg Oral QHS PRN Ivor Costa, MD         Discharge Medications: Please see discharge summary for a list of discharge medications.  Relevant Imaging Results:  Relevant Lab Results:   Additional Information SSN: [id number]  Rigoberto Noel, LCSW

## 2016-10-21 NOTE — Clinical Social Work Note (Signed)
Clinical Social Work Assessment  Patient Details  Name: Daniel [name] MRN: [id number] Date of Birth: 1919-07-27  Date of referral:  10/21/16               Reason for consult:  Facility Placement, Discharge Planning                Permission sought to share information with:  Facility Sport and exercise psychologist, Family Supports Permission granted to share information::  Yes, Verbal Permission Granted  Name::     Son Comptroller::  SNFs  Relationship::     Contact Information:     Housing/Transportation Living arrangements for the past 2 months:  [place], [place] of Information:  Patient, Adult Children Patient Interpreter Needed:  None Criminal Activity/Legal Involvement Pertinent to Current Situation/Hospitalization:  No - Comment as needed Significant Relationships:  Adult Children Lives with:  Self Do you feel safe going back to the place where you live?  Yes Need for family participation in patient care:  Yes (Comment)  Care giving concerns:  The patient and son agree with recommendation for return to SNF due to patient's high care needs.   Social Worker assessment / plan:  CSW met with patient and son at bedside to complete assessment. The patient is very [place]. The patient shares that he fell at [facility] which resulted in his hospitalization. The patient and son would both like the patient to return to [facility] once medically stable. CSW explained CSW role and process of getting the patient back to the facility. The patient and son are aware that they have not placed a hold on the patient's bed and that we cannot guarantee the patient's return. The patient and son understand that the patient may have to go to a different facility if [facility] fills the patient's bed.  Employment status:  Retired Forensic scientist:  [employer] PT Recommendations:  [place] / Referral to community resources:  [place]  Patient/Family's Response to care:  The patient and son are happy with the care the patient has received.  Patient/Family's Understanding of and Emotional Response to Diagnosis, Current Treatment, and Prognosis:  The patient and son appear to have a good understanding of the patient's diagnosis and reason for admission. The patient understands that he will need to return to [facility] at time of discharge.   Emotional Assessment Appearance:  Appears stated age Attitude/Demeanor/Rapport:  Other (The patient is appropriate and welcoming of CSW.) Affect (typically observed):  Accepting, Appropriate, Calm, Pleasant Orientation:  Oriented to Self, Oriented to Place, Oriented to  Time, Oriented to Situation Alcohol / Substance use:  Not Applicable Psych involvement (Current and /or in the community):  No (Comment)  Discharge Needs  Concerns to be addressed:  Discharge Planning Concerns Readmission within the last 30 days:  No Current discharge risk:  Chronically ill, Physical Impairment Barriers to Discharge:  Continued Medical Work up   Rigoberto Noel, LCSW 10/21/2016, 9:55 PM

## 2016-10-21 NOTE — Progress Notes (Signed)
Patient is not seen. I was informed by my nursing staff that the patient is supposed to see me tomorrow in the outpatient clinic but is currently admitted I reviewed his chart. I have discontinued his Ibrutinib due to pancytopenia Do not restart Ibrutinib until the patient recovers from sepsis With his age and significant decline in performance status, consider palliative care consult while hospitalized Please call me if questions arise

## 2016-10-21 NOTE — Evaluation (Signed)
Occupational Therapy Evaluation Patient Details Name: Daniel [name] MRN: [id number] DOB: 31-Aug-1919 Today's Date: 10/21/2016    History of Present Illness Patient is a 82 yo male admitted 10/18/16 following a fall with lacerations on Rt forehead and Lt knee.  Head CT negative per report.  Patient noted to have UTI.     PMH:  indwelling Foley catheter, dementia, anemia, HOH, CHF, HTN   Clinical Impression   Pt with decline in function and safety with ADLs and ADL mobility with decreased strength, balance and endurance. Pt would benefit from acute OT services to address impairments to increase level of function and safety    Follow Up Recommendations  SNF;Supervision/Assistance - 24 hour    Equipment Recommendations  None recommended by OT    Recommendations for Other Services       Precautions / Restrictions Precautions Precautions: Fall Restrictions Weight Bearing Restrictions: No      Mobility Bed Mobility Overal bed mobility: Needs Assistance Bed Mobility: Supine to Sit;Sit to Supine     Supine to sit: Min assist Sit to supine: Min guard   General bed mobility comments: Assist to raise trunk to upright position.  Patient able to sit EOB x 6 minutes with fair balance.  Transfers Overall transfer level: Needs assistance Equipment used: Rolling walker (2 wheeled) Transfers: Sit to/from Stand Sit to Stand: Min assist         General transfer comment: Verbal cues for hand placement.  Assist to rise to standing and for balance.  Patient very weak    Balance Overall balance assessment: Needs assistance   Sitting balance-Leahy Scale: Fair       Standing balance-Leahy Scale: Poor                              ADL Overall ADL's : Needs assistance/impaired     Grooming: Wash/dry hands;Wash/dry face;Min guard;Sitting   Upper Body Bathing: Min guard;Sitting   Lower Body Bathing: Maximal assistance   Upper Body Dressing : Min guard;Sitting   Lower Body Dressing: Maximal assistance   Toilet Transfer: Minimal assistance;Stand-pivot;BSC   Toileting- Clothing Manipulation and Hygiene: Moderate assistance;Maximal assistance Toileting - Clothing Manipulation Details (indicate cue type and reason): has foley     Functional mobility during ADLs: Moderate assistance       Vision Vision Assessment?: No apparent visual deficits              Pertinent Vitals/Pain Pain Assessment: No/denies pain     Hand Dominance Right   Extremity/Trunk Assessment Upper Extremity Assessment Upper Extremity Assessment: Generalized weakness;LUE deficits/detail LUE Deficits / Details: Decreased shoulder ROM to approx 30* flex and abduction - history of injury to shoulder.       Cervical / Trunk Assessment Cervical / Trunk Assessment: Kyphotic   Communication Communication Communication: HOH   Cognition Arousal/Alertness: Awake/alert Behavior During Therapy: WFL for tasks assessed/performed Overall Cognitive Status: No family/caregiver present to determine baseline cognitive functioning Area of Impairment: Memory     Memory: Decreased short-term memory             General Comments   Pt very pleasant and cooperative                 Home Living Family/patient expects to be discharged to:: Skilled nursing facility  Additional Comments: Patient reports he was at home pta, and was living by himself.        Prior Functioning/Environment Level of Independence: Needs assistance  Gait / Transfers Assistance Needed: Patient ambulating with RW and using w/c for longer distances (per chart) ADL's / Homemaking Assistance Needed: Assist with all ADL's prior to 2 months ago per pt   Comments: has a cane and rolling walker, uses both; stated his RW doesn't roll straight        OT Problem List: Decreased strength;Decreased cognition;Impaired balance (sitting and/or  standing);Decreased activity tolerance;Decreased knowledge of use of DME or AE;Decreased range of motion   OT Treatment/Interventions: Self-care/ADL training;DME and/or AE instruction;Therapeutic activities;Patient/family education    OT Goals(Current goals can be found in the care plan section) Acute Rehab OT Goals Patient Stated Goal: get better and go home OT Goal Formulation: With patient Time For Goal Achievement: 2016/11/07 Potential to Achieve Goals: Good ADL Goals Pt Will Perform Grooming: with supervision;with set-up;sitting Pt Will Perform Upper Body Bathing: with supervision;with set-up;sitting Pt Will Perform Lower Body Bathing: with mod assist;sitting/lateral leans;sit to/from stand Pt Will Perform Upper Body Dressing: with supervision;with set-up;sitting Pt Will Transfer to Toilet: with min assist;bedside commode Pt Will Perform Toileting - Clothing Manipulation and hygiene: with min assist;with mod assist;sitting/lateral leans;sit to/from stand  OT Frequency: Min 2X/week   Barriers to D/C:  no barriers                        End of Session Equipment Utilized During Treatment: Gait belt;Other (comment);Rolling walker (BSC, has foley)  Activity Tolerance: Patient limited by fatigue Patient left: in bed;with call bell/phone within reach   Time: 1008-1035 OT Time Calculation (min): 27 min Charges:  OT General Charges $OT Visit: 1 Procedure OT Evaluation $OT Eval Moderate Complexity: 1 Procedure OT Treatments $Therapeutic Activity: 8-22 mins G-Codes:    Britt Bottom 10/21/2016, 12:34 PM

## 2016-10-21 NOTE — Progress Notes (Signed)
Speech Language Pathology Treatment: Dysphagia  Patient Details Name: Daniel [name] MRN: [id number] DOB: 1919-01-20 Today's Date: 10/21/2016 Time: [id number] SLP Time Calculation (min) (ACUTE ONLY): 32 min  Assessment / Plan / Recommendation Clinical Impression   Pt seen with lunch meal tray. He consumed trials of thin liquids, pureed and regular solids. Prolonged mastication of solids were observed, but family present reports this as his baseline. Verbal cues to swallow aided in oral clearance. Immediate and delayed coughing occurred throughout the session. Pt had severe regurgitation of his PO intake towards the end of trials. Given bedside performance and pt known esophageal issues, recommend downgrading to Dys 1 and thin liquids. MD may wish to consider a GI evaluation. Will continue to follow for diet tolerance.   HPI HPI: Daniel [name] a 82 y.o.malewith medical history significant of indwelling cath, NHL, small cell B-lymphoma, right-sided hydronephrosis, poor hearing wearing hearing aids, BPH, GERD, CKD-III, dCHF, mild bronchiectasis with chronic cough, who presents with fall.   Per report, pt fell in SNF. He reports to me and also to RN in ED that his fall happed after had bowel movement and slipped in bathroom.  The patient does complain of trouble swallowing.  He reported that about 3 years ago he had botex injections for a "small esophagus" and that he recently saw the GI doctor again but they are not able to repeat the botox injections.  He reported that his current symptoms are similar to just prior to his botox injections 3 years ago.  Most recent chest xray is negative for acute findings, however, the patient is dehydrated with a bolus of fluid just started.  Head CT is negative for acute findings.  The patient has had no previously documented swallowing evaluation at [facility].        SLP Plan  Continue with current plan of care     Recommendations  Diet recommendations:  Dysphagia 1 (puree);Thin liquid Liquids provided via: Cup;Straw Medication Administration: Crushed with puree Supervision: Full supervision/cueing for compensatory strategies;Patient able to self feed Compensations: Minimize environmental distractions;Small sips/bites;Slow rate;Follow solids with liquid;Other (Comment) (no cold drinks) Postural Changes and/or Swallow Maneuvers: Seated upright 90 degrees;Upright 30-60 min after meal                Oral Care Recommendations: Oral care BID Follow up Recommendations: Other (comment) (tba) Plan: Continue with current plan of care       [place]ran, Student SLP  Shela Leff 10/21/2016, 2:15 PM

## 2016-10-21 NOTE — Consult Note (Signed)
   [facility] CM Inpatient Consult   10/21/2016  Daniel [name] [birth year] [id number]     Patient screened for potential [facility] Care Management services. Chart reviewed. Noted current discharge plan is for SNF. Spoke with inpatient RNCM to confirm. There are no identifiable [facility] Care Management needs at this time. If patient's post hospital needs change, please place a [facility] Care Management consult. For questions please contact:  Marthenia Rolling, [place], RN,BSN [facility] Liaison [phone]

## 2016-10-22 DIAGNOSIS — M6281 Muscle weakness (generalized): Secondary | ICD-10-CM

## 2016-10-22 DIAGNOSIS — Z7189 Other specified counseling: Secondary | ICD-10-CM

## 2016-10-22 DIAGNOSIS — J690 Pneumonitis due to inhalation of food and vomit: Secondary | ICD-10-CM | POA: Diagnosis present

## 2016-10-22 DIAGNOSIS — Z515 Encounter for palliative care: Secondary | ICD-10-CM

## 2016-10-22 DIAGNOSIS — A419 Sepsis, unspecified organism: Secondary | ICD-10-CM | POA: Diagnosis present

## 2016-10-22 DIAGNOSIS — C8300 Small cell B-cell lymphoma, unspecified site: Secondary | ICD-10-CM

## 2016-10-22 LAB — CBC WITH DIFFERENTIAL/PLATELET
BASOS ABS: 0 10*3/uL (ref 0.0–0.1)
BASOS PCT: 0 %
EOS ABS: 0 10*3/uL (ref 0.0–0.7)
EOS PCT: 1 %
HCT: 24.5 % — ABNORMAL LOW (ref 39.0–52.0)
Hemoglobin: 7.5 g/dL — ABNORMAL LOW (ref 13.0–17.0)
LYMPHS PCT: 45 %
Lymphs Abs: 1.2 10*3/uL (ref 0.7–4.0)
MCH: 26 pg (ref 26.0–34.0)
MCHC: 30.6 g/dL (ref 30.0–36.0)
MCV: 84.8 fL (ref 78.0–100.0)
MONO ABS: 0.3 10*3/uL (ref 0.1–1.0)
Monocytes Relative: 10 %
Neutro Abs: 1.2 10*3/uL — ABNORMAL LOW (ref 1.7–7.7)
Neutrophils Relative %: 44 %
PLATELETS: 66 10*3/uL — AB (ref 150–400)
RBC: 2.89 MIL/uL — AB (ref 4.22–5.81)
RDW: 17.9 % — AB (ref 11.5–15.5)
WBC: 2.7 10*3/uL — AB (ref 4.0–10.5)

## 2016-10-22 LAB — RENAL FUNCTION PANEL
ALBUMIN: 1.7 g/dL — AB (ref 3.5–5.0)
Anion gap: 4 — ABNORMAL LOW (ref 5–15)
BUN: 40 mg/dL — AB (ref 6–20)
CALCIUM: 8 mg/dL — AB (ref 8.9–10.3)
CHLORIDE: 115 mmol/L — AB (ref 101–111)
CO2: 23 mmol/L (ref 22–32)
CREATININE: 1.79 mg/dL — AB (ref 0.61–1.24)
GFR, EST AFRICAN AMERICAN: 35 mL/min — AB (ref >60)
GFR, EST NON AFRICAN AMERICAN: 31 mL/min — AB (ref >60)
Glucose, Bld: 107 mg/dL — ABNORMAL HIGH (ref 65–99)
PHOSPHORUS: 2.4 mg/dL — AB (ref 2.5–4.6)
Potassium: 3.8 mmol/L (ref 3.5–5.1)
SODIUM: 142 mmol/L (ref 135–145)

## 2016-10-22 MED ORDER — DM-GUAIFENESIN ER 30-600 MG PO TB12
1.0000 | ORAL_TABLET | Freq: Two times a day (BID) | ORAL | Status: DC
  Administered 2016-10-22 – 2016-10-24 (×4): 1 via ORAL
  Filled 2016-10-22 (×4): qty 1

## 2016-10-22 MED ORDER — AMOXICILLIN-POT CLAVULANATE 500-125 MG PO TABS: 1.0000 | ORAL_TABLET | Freq: Three times a day (TID) | ORAL | Status: DC

## 2016-10-22 MED ORDER — LEVOFLOXACIN IN D5W 500 MG/100ML IV SOLN
500.0000 mg | INTRAVENOUS | Status: DC
  Administered 2016-10-22: 500 mg via INTRAVENOUS
  Filled 2016-10-22 (×2): qty 100

## 2016-10-22 MED ORDER — PIPERACILLIN-TAZOBACTAM IN DEX 2-0.25 GM/50ML IV SOLN
2.2500 g | Freq: Four times a day (QID) | INTRAVENOUS | Status: DC
  Administered 2016-10-22 (×2): 2.25 g via INTRAVENOUS
  Filled 2016-10-22 (×4): qty 50

## 2016-10-22 MED ORDER — ALBUMIN HUMAN 5 % IV SOLN
12.5000 g | Freq: Once | INTRAVENOUS | Status: AC
  Administered 2016-10-22: 12.5 g via INTRAVENOUS
  Filled 2016-10-22: qty 250

## 2016-10-22 NOTE — Progress Notes (Signed)
[facility] Progress Note  Patient: Daniel [name] [id number]   PCP: Wenda Low, MD DOB: June 16, 1919   DOA: 10/18/2016   DOS: 10/22/2016   Date of Service: the patient was seen and examined on 10/22/2016  Brief hospital course: Pt. with PMH of indwelling cath, NHL, small cell B-lymphoma, right-sided hydronephrosis, poor hearing wearing hearing aids, BPH, GERD, CKD-III, dCHF, mild bronchiectasis with chronic cough; admitted on 10/18/2016, with complaint of fall, was found to have Sepsis due to UTI. Currently further plan is continue antibiotics.  Assessment and Plan: 1.Fall: Etiology is not clear. CT head is negative for intracranial abnormalities. No focal neurological findings, less likely to have stroke. Likely vasovagal syncope, generalized weakness , deconditioning and possible UTI.   2. Sepsis due to UTI (urinary tract infection):   Urine culture this admission 2 gross multiple species. Patient has been on IV Zosyn since admission. Blood culture negative for 24 hours. In the past Patient had positive urinalysis with Pseudomonas with pensensitivity on 07/28/16. Change antibiotics from IV Zosyn to Levaquin. Preferably oral.  3. Aspiration pneumonia. History of achalasia CT scan of the chest was performed to identify any possible aspiration event. CT scan shows evidence of multiple focal pneumonia. Also aspiration likely. Discussed with patient as well as son. They would like to continue with the least restrictive diet. Saying that the patient has been aspirating for last 30 years. Speech therapy consulted who recommends dysphagia type I at 10 liquid. As per family, patient Recently saw Dr. Ardis Hughs from GI, who felt that patient is not a candidate for any repeat Botox injection.  4. Bronchiectasis with acute exacerbation:  Patient has chronic cough. He has been follow-up by pulmonologist.  -will continue Pepcid and Protonix for GERD -prn albuterol nebulizer -Mucinex  for cough -Follow-up with pulmonology  5. AoCKD-III and hx of hydronephrosis:  Baseline Cre is 1.3-1.6, pt's Cre is 2.37 and BUN 55 on admission.  Likely due to prerenal secondary to dehydration.  Pt has hx of R hydronephrosis. With worsening per ultrasound - Follow up renal function - Highly appreciate Dr. Lynne Logan recommendation, who says that avoiding any procedure due to patient's advanced age as well as desire to avoid aggressive intervention.  6. 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 Alvy Bimler. Currently on imbruvica -stop Imbruvica Anemia stable, thrombocytopenia worsening in the setting of infection. Patient has bruises, but no active bleeding.  Mental status is normal-->No TTP. Likely from chemotherapy medication. Currently is on hold. -f/u by CBC  7. Chronic diastolic CHF (congestive heart failure) ([place]): 2-D echo on 08/01/16 showed EF 60-65% with grade 1 diastolic dysfunction. Patient is not on diuretics. No leg edema or JVD, CHF is compensated on admission. -normal BNP  8. BPH (benign prostatic hyperplasia): -continue Proscar, oxybutynin, Myrbetriq  9. GERD (gastroesophageal reflux disease): -Pepcid and Protonix  10. Skin tear and bruises -wound care consult  11. Goals of care discussion. Patient currently with aspiration pneumonia and high risk for aspiration. Also with bilateral hydronephrosis due to BPH and now requiring chronic Foley catheter due to acute kidney injury on presentation. Also has recurrent UTI without the catheter prior to this admission. Not a candidate for any procedure for hydronephrosis or achalasia due to advance age and physical deconditioning. Has non-Hodgkin's lymphoma as well. With all this finding I discussed with patient's son as well as patient regarding goals of care. He mentions that right now they would like to focus  on getting him better from the infection and  going back to rehabilitation for therapy. Should he have reoccurrence of infection or worsening at that point they will consider hospice.  Pain management: When necessary Tylenol Activity: Consulted physical therapy Bowel regimen: last BM 10/18/2016 Diet: Cardiac diet DVT Prophylaxis: subcutaneous Heparin  Advance goals of care discussion: DNR/DNI  Family Communication: no family was present at bedside, at the time of interview.  Disposition:  Discharge to SNF. Expected discharge date: 10/24/2016, negative blood culture  Consultants: Urologist Procedures: Ultrasound renal  Antibiotics: Anti-infectives    Start     Dose/Rate Route Frequency Ordered Stop   10/22/16 1645  amoxicillin-clavulanate (AUGMENTIN) 500-125 MG per tablet 500 mg     1 tablet Oral Every 8 hours 10/22/16 1637     10/22/16 0900  piperacillin-tazobactam (ZOSYN) IVPB 2.25 g  Status:  Discontinued     2.25 g 100 mL/hr over 30 Minutes Intravenous Every 6 hours 10/22/16 0825 10/22/16 1636   10/19/16 0300  piperacillin-tazobactam (ZOSYN) IVPB 2.25 g  Status:  Discontinued     2.25 g 100 mL/hr over 30 Minutes Intravenous Every 6 hours 10/18/16 1944 10/22/16 0823   10/18/16 1930  piperacillin-tazobactam (ZOSYN) IVPB 2.25 g     2.25 g 100 mL/hr over 30 Minutes Intravenous  Once 10/18/16 1927 10/18/16 2209   10/18/16 1845  cefTRIAXone (ROCEPHIN) 1 g in dextrose 5 % 50 mL IVPB  Status:  Discontinued     1 g 100 mL/hr over 30 Minutes Intravenous  Once 10/18/16 1831 10/18/16 1925     Subjective: Feeling About the same, but has increasing cough. No chest pain and abdominal pain.  Objective: Physical Exam: Vitals:   10/22/16 0100 10/22/16 0600 10/22/16 0800 10/22/16 1500  BP: (!) 96/50  (!) 99/56 (!) 116/47  Pulse: 81   89  Resp: 18   (!) 22  Temp: 98 F (36.7 C) 97.9 F (36.6 C) 97.6 F (36.4 C)   TempSrc: Oral Oral Oral   SpO2: 98%   100%  Weight:  58.3 kg (128 lb 8 oz)    Height:        Intake/Output  Summary (Last 24 hours) at 10/22/16 1637 Last data filed at 10/22/16 1500  Gross per 24 hour  Intake          1855.75 ml  Output              650 ml  Net          1205.75 ml   Filed Weights   10/20/16 0436 10/21/16 0443 10/22/16 0600  Weight: 56.2 kg (124 lb) 59.1 kg (130 lb 4.8 oz) 58.3 kg (128 lb 8 oz)    General: Alert, Awake and Oriented to Time, Place and Person. Appear in moderate distress, affect appropriate Eyes: PERRL, Conjunctiva normal ENT: Oral Mucosa clear moist. Neck: no JVD, no Abnormal Mass Or lumps Cardiovascular: S1 and S2 Present, no Murmur, Respiratory: Bilateral Air entry equal and Decreased, no use of accessory muscle, Bilateral Crackles, no wheezes Abdomen: Bowel Sound present, Soft and no tenderness Skin: no redness, no Rash, no induration Extremities: no Pedal edema, no calf tenderness Neurologic: Grossly no focal neuro deficit. Bilaterally Equal motor strength  Data Reviewed: CBC:  Recent Labs Lab 10/18/16 1634 10/19/16 0330 10/20/16 1015 10/21/16 0439 10/21/16 1044 10/22/16 0343  WBC 5.5 3.3* 2.9* 3.2* 3.1* 2.7*  NEUTROABS 3.2  --  1.4* 1.5*  --  1.2*  HGB 9.0* 7.5* 7.8* 7.3*  7.9* 7.5*  HCT 29.0* 23.6* 25.4* 23.4* 25.9* 24.5*  MCV 83.1 83.7 84.7 85.7 86.3 84.8  PLT 81* 76* 69* 54* 45* 66*   Basic Metabolic Panel:  Recent Labs Lab 10/18/16 1634 10/19/16 0330 10/20/16 1015 10/21/16 0439 10/22/16 0343  NA 134* 139 140 141 142  K 4.4 4.0 3.6 3.4* 3.8  CL 102 107 111 112* 115*  CO2 23 24 21* 22 23  GLUCOSE 96 120* 129* 88 107*  BUN 55* 52* 40* 42* 40*  CREATININE 2.37* 2.17* 1.94* 1.89* 1.79*  CALCIUM 9.4 8.9 8.3* 7.8* 8.0*  PHOS  --   --  2.7 2.4* 2.4*    Liver Function Tests:  Recent Labs Lab 10/18/16 1634 10/20/16 1015 10/21/16 0439 10/22/16 0343  AST 22  --   --   --   ALT 16*  --   --   --   ALKPHOS 137*  --   --   --   BILITOT 1.1  --   --   --   PROT 4.3*  --   --   --   ALBUMIN 2.4* 1.9* 1.7* 1.7*   No results  for input(s): LIPASE, AMYLASE in the last 168 hours. No results for input(s): AMMONIA in the last 168 hours. Coagulation Profile: No results for input(s): INR, PROTIME in the last 168 hours. Cardiac Enzymes: No results for input(s): CKTOTAL, CKMB, CKMBINDEX, TROPONINI in the last 168 hours. BNP (last 3 results) No results for input(s): PROBNP in the last 8760 hours.  CBG: No results for input(s): GLUCAP in the last 168 hours.  Studies: Ct Chest Wo Contrast  Result Date: 10/21/2016 CLINICAL DATA:  Chronic cough, shortness of breath. History of bronchiectasis, lung nodule, lymphoma on chemotherapy. EXAM: CT CHEST WITHOUT CONTRAST TECHNIQUE: Multidetector CT imaging of the chest was performed following the standard protocol without IV contrast. COMPARISON:  CT abdomen and pelvis October 18, 2016 FINDINGS: Cardiovascular: Heart size is normal. Mild coronary artery calcifications. No pericardial effusions. Main pulmonary artery is 3.3 cm, mildly enlarged. Thoracic aorta is normal course and caliber with mild calcific atherosclerosis. Mildly vaso vasorum associated with anemia. Mediastinum/Nodes: Multiple sub cm pretracheal, carinal, subcarinal and hilar lymph nodes, limited assessment on noncontrast CT. No mediastinal mass. Circumferential thickening of the thoracic esophagus. Lungs/Pleura: Worsening lower lobe consolidation. Scattered tree-in-bud infiltrates a. 3 mm LEFT upper lobe pulmonary nodule below size followup recommendation. RIGHT lung base ground-glass centrilobular nodules. Small pleural effusions are new. Small amount of debris layering in the trachea. No pneumothorax. Upper Abdomen: Splenomegaly best characterized on recent CT. Small amount of ascites in the upper abdomen is new. Musculoskeletal: Patient's cachectic.  T12 butterfly vertebral body. IMPRESSION: Worsening multifocal pneumonia with small pleural effusions. Aspirate layering in the trachea. New upper abdominal ascites. Thickened  thoracic esophagus suggests esophagitis, which could be confirmed with endoscopy. Electronically Signed   By: Elon Alas M.D.   On: 10/21/2016 22:42     Scheduled Meds: . amoxicillin-clavulanate  1 tablet Oral Q8H  . Chlorhexidine Gluconate Cloth  6 each Topical Q0600  . dextromethorphan-guaiFENesin  1 tablet Oral BID  . docusate sodium  100 mg Oral BID  . feeding supplement (ENSURE ENLIVE)  237 mL Oral TID BM  . feeding supplement (PRO-STAT SUGAR FREE 64)  30 mL Oral BID  . finasteride  5 mg Oral Q breakfast  . mirabegron ER  50 mg Oral Daily  . multivitamin  1 tablet Oral BID  . mupirocin ointment  1  application Nasal BID  . pantoprazole  40 mg Oral Daily  . polyvinyl alcohol  1 drop Both Eyes BID  . sodium chloride flush  3 mL Intravenous Q12H  . Vitamin D (Ergocalciferol)  50,000 Units Oral Once per day on Mon Thu   Continuous Infusions:  PRN Meds: acetaminophen **OR** acetaminophen, albuterol, artificial tears, bisacodyl, menthol-cetylpyridinium, ondansetron **OR** ondansetron (ZOFRAN) IV, oxybutynin, polyethylene glycol, zolpidem  Time spent: 30 minutes  Author: Berle Mull, MD [facility] Pager: [phone] 10/22/2016 4:37 PM  If 7PM-7AM, please contact night-coverage at [email or web address], password [facility]

## 2016-10-22 NOTE — Progress Notes (Signed)
Dressing to R shoulder, L Leg/Knee changed per orders.

## 2016-10-22 NOTE — Progress Notes (Signed)
Physical Therapy Treatment Patient Details Name: Daniel [name] MRN: [id number] DOB: 1919-08-21 Today's Date: 10/22/2016    History of Present Illness Patient is a 82 yo male admitted 10/18/16 following a fall with lacerations on Rt forehead and Lt knee.  Head CT negative per report.  Patient noted to have UTI.     PMH:  indwelling Foley catheter, dementia, anemia, HOH, CHF, HTN    PT Comments    Patient progressing well towards PT goals. Tolerated gait training today with min A for balance/safety. Pt with soft BP during session and reports dizziness throughout mobility. Might be the reason pt keeps falling. See below. Supine BP 108/58 Sitting BP 97/54 Sitting BP post ambulation 96/54 Eager to get up and ambulate. Pt continues to be high fall risk esp due to dizziness and requires hands on assist for safety. Will follow.   Follow Up Recommendations  SNF;Supervision/Assistance - 24 hour     Equipment Recommendations  None recommended by PT    Recommendations for Other Services       Precautions / Restrictions Precautions Precautions: Fall Precaution Comments: watch BP Restrictions Weight Bearing Restrictions: No    Mobility  Bed Mobility Overal bed mobility: Needs Assistance Bed Mobility: Supine to Sit     Supine to sit: Min guard;HOB elevated     General bed mobility comments: Able to get to EOB without assist. + dizziness.  Transfers Overall transfer level: Needs assistance Equipment used: Rolling walker (2 wheeled) Transfers: Sit to/from Stand Sit to Stand: Min assist         General transfer comment: Assist to boost from EOB with cues for hand placement. Stood from [employer]. transferred to chair post ambulation bout.  Ambulation/Gait Ambulation/Gait assistance: Min assist Ambulation Distance (Feet): 100 Feet Assistive device: Rolling walker (2 wheeled) Gait Pattern/deviations: Step-through pattern;Decreased stride length;Shuffle;Trunk flexed Gait  velocity: decreased   General Gait Details: Slow, shuffling like gait with increased knee flexion bilaterally. Dizziness reported throughout ambulation.   Stairs            Wheelchair Mobility    Modified Rankin (Stroke Patients Only)       Balance Overall balance assessment: Needs assistance;History of Falls Sitting-balance support: Feet supported;No upper extremity supported Sitting balance-Leahy Scale: Fair     Standing balance support: During functional activity Standing balance-Leahy Scale: Poor Standing balance comment: Reliant on BUEs for support ins tanding. Total A for pericare.                    Cognition Arousal/Alertness: Awake/alert Behavior During Therapy: WFL for tasks assessed/performed Overall Cognitive Status: No family/caregiver present to determine baseline cognitive functioning   Orientation Level: Disoriented to;Situation             General Comments: Seems less confused today. Reports using w/c at rehab for the last 3 months.    Exercises      General Comments        Pertinent Vitals/Pain Pain Assessment: No/denies pain Faces Pain Scale: No hurt    Home Living                      Prior Function            PT Goals (current goals can now be found in the care plan section) Progress towards PT goals: Progressing toward goals    Frequency    Min 2X/week      PT Plan Current plan remains appropriate  Co-evaluation             End of Session Equipment Utilized During Treatment: Gait belt Activity Tolerance: Patient tolerated treatment well Patient left: in chair;with call bell/phone within reach;with chair alarm set     Time: [id number] PT Time Calculation (min) (ACUTE ONLY): 25 min  Charges:  $Gait Training: 23-37 mins                    G Codes:      Suri Tafolla A Lynnlee Revels 10/22/2016, 9:49 AM Wray Kearns, PT, DPT [phone]

## 2016-10-22 NOTE — Care Management Note (Signed)
Case Management Note  Patient Details  Name: Daniel [name] MRN: [id number] Date of Birth: [birth year]  Subjective/Objective: Pt presented for Fall from [facility] SNF. Plan will be to return once stable for d/c. Palliative Care Consulting.                   Action/Plan: CSW assisting with disposition needs. CM will continue to monitor.   Expected Discharge Date:                  Expected Discharge Plan:  Skilled Nursing Facility  In-House Referral:  Clinical Social Work  Discharge planning Services  CM Consult  Post Acute Care Choice:  NA Choice offered to:  NA  DME Arranged:  N/A DME Agency:  NA  HH Arranged:  NA HH Agency:  NA  Status of Service:  Completed, signed off  If discussed at [place] of Stay Meetings, dates discussed:    Additional Comments:  Bethena Roys, RN 10/22/2016, 3:52 PM

## 2016-10-22 NOTE — Progress Notes (Signed)
ANTIBIOTIC CONSULT NOTE - INITIAL  Pharmacy Consult for Levaquin Indication: UTI and aspiration PNA  No Known Allergies  Patient Measurements: Height: 5\' 5"  (165.1 cm) Weight: 128 lb 8 oz (58.3 kg) IBW/kg (Calculated) : 61.5 Adjusted Body Weight:    Vital Signs: Temp: 97.6 F (36.4 C) (11/14 0800) Temp Source: Oral (11/14 0800) BP: 116/47 (11/14 1500) Pulse Rate: 89 (11/14 1500) Intake/Output from previous day: 11/13 0701 - 11/14 0700 In: 4842.1 [P.O.:240; I.V.:4452.1; IV Piggyback:150] Out: [id number] [Urine:1251; Stool:2] Intake/Output from this shift: Total I/O In: 462 [P.O.:462] Out: 250 [Urine:250]  Labs:  Recent Labs  10/20/16 1015 10/21/16 0439 10/21/16 1044 10/22/16 0343  WBC 2.9* 3.2* 3.1* 2.7*  HGB 7.8* 7.3* 7.9* 7.5*  PLT 69* 54* 45* 66*  CREATININE 1.94* 1.89*  --  1.79*   Estimated Creatinine Clearance: 20.4 mL/min (by C-G formula based on SCr of 1.79 mg/dL (H)). No results for input(s): VANCOTROUGH, VANCOPEAK, VANCORANDOM, GENTTROUGH, GENTPEAK, GENTRANDOM, TOBRATROUGH, TOBRAPEAK, TOBRARND, AMIKACINPEAK, AMIKACINTROU, AMIKACIN in the last 72 hours.   Microbiology:   Medical History: Past Medical History:  Diagnosis Date  . Anemia   . B12 deficiency 06/06/2016  . BPH (benign prostatic hyperplasia)   . CKD (chronic kidney disease), stage III   . Congenital malformation of spine    T12  . Cough for last 2 years   occasional white sputum  . Diverticulosis   . GERD with esophagitis   . History of kidney stones 2013  . HOH (hard of hearing)   . Hx of hydronephrosis   . Hydronephrosis 10/27/2014  . NHL (non-Hodgkin's lymphoma) ([place])    nhl dx 5/09;  . Pneumonia   . Prostate enlargement   . Sleep apnea   . 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 albuterol (PROVENTIL) (2.5 MG/3ML) 0.083% nebulizer solution Take 3 mLs (2.5 mg total) by nebulization every 6  (six) hours as needed for wheezing or shortness of breath. 75 mL 12 10/17/2016 at Unknown time  . Amino Acids-Protein Hydrolys (FEEDING SUPPLEMENT, PRO-STAT SUGAR FREE 64,) LIQD Take 30 mLs by mouth 2 (two) times daily. 900 mL 0 10/18/2016 at Unknown time  . benzonatate (TESSALON) 100 MG capsule Take by mouth 3 (three) times daily as needed for cough.   10/18/2016 at Unknown time  . bisacodyl (DULCOLAX) 10 MG suppository Place 10 mg rectally daily as needed for moderate constipation.   Past Week at Unknown time  . Cholecalciferol (VITAMIN D3) 50000 units CAPS Take 1 capsule by mouth 2 (two) times a week.   10/17/2016 at Unknown time  . dextromethorphan (DELSYM) 30 MG/5ML liquid Take 60 mg by mouth as needed for cough.   10/17/2016 at Unknown time  . docusate sodium (COLACE) 100 MG capsule Take 100 mg by mouth 2 (two) times daily.   10/18/2016 at Unknown time  . famotidine (PEPCID) 10 MG tablet Take 1 tablet (10 mg total) by mouth daily. 30 tablet 0 10/17/2016 at Unknown time  . finasteride (PROSCAR) 5 MG tablet Take 5 mg by mouth daily with breakfast.    10/17/2016 at Unknown time  . glucosamine-chondroitin 500-400 MG tablet Take 1 tablet by mouth every morning.   10/18/2016 at Unknown time  . Hypromellose (ARTIFICIAL TEARS OP) Apply to eye as needed.   10/18/2016 at Unknown time  . Hypromellose (GENTEAL MILD TO MODERATE OP) Place 1 drop into both eyes 2 (two) times  daily.   10/17/2016 at Unknown time  . ibrutinib (IMBRUVICA) 140 MG capsul Take 420 mg by mouth every morning.   10/18/2016 at Unknown time  . levofloxacin (LEVAQUIN) 500 MG tablet Take 500 mg by mouth daily. Started on 10-14-16. Therapy for 7 days   10/17/2016 at Unknown time  . mirabegron ER (MYRBETRIQ) 50 MG TB24 tablet Take 50 mg by mouth daily.   10/18/2016 at Unknown time  . Multiple Vitamins-Minerals (ICAPS AREDS 2) CAPS Take 1 capsule by mouth 2 (two) times daily.   10/18/2016 at Unknown time  . oxybutynin (DITROPAN) 5 MG tablet Take 5 mg  by mouth every 12 (twelve) hours as needed for bladder spasms.   Past Month at Unknown time  . pantoprazole (PROTONIX) 40 MG tablet Take 40 mg by mouth daily.   10/18/2016 at Unknown time  . phenazopyridine (PYRIDIUM) 100 MG tablet Take 100 mg by mouth 3 (three) times daily as needed (bladder spasms).   Past Month at Unknown time  . polyethylene glycol (MIRALAX / GLYCOLAX) packet Take 17 g by mouth daily as needed for mild constipation or moderate constipation. 14 each 0 Past Month at Unknown time  . PRESCRIPTION MEDICATION Take 120 mLs by mouth 4 (four) times daily. Medpass 2.0   10/18/2016 at Unknown time  . Sodium Phosphates (FLEET ENEMA RE) Place rectally as needed.   Past Month at Unknown time  . pseudoephedrine-guaifenesin (MUCINEX D) 60-600 MG 12 hr tablet Take 1 tablet by mouth 2 (two) times daily as needed for congestion (cough).   Taking   Assessment: Daniel [name] is a 82 y.o. male admitted on 10/18/2016 s/p fall.   Patient has a history of Pseudomonas infection and currently has an indwelling catheter that was recently changed.  He has AKI and is also immunosuppressed.  ID: UTI, hx Pseudomonas, has indwelling cath recently changed, immunosuppressed.  -Afebrile, WBC was WNL (have to consider that he is an onc patient) but decr'd to 3.1k >2.7k UCx x2 multiple species/Final F/u Bcx 's ordered  11/13 SCr improved today down to 1.79;  CrCl ~ 20.4 ml/min  Antimicrobials this admission:  Zosyn 11/10 >>11/14 Levaquin 11/14>>  Goal of Therapy:  Eradication of infection  Plan:  Levaquin 500mg  IV q48h   Daniel [name] S. Alford Highland, PharmD, BCPS Clinical Staff Pharmacist Pager [phone]  Daniel [name] 10/22/2016,4:44 PM

## 2016-10-22 NOTE — Consult Note (Signed)
Consultation Note Date: 10/22/2016   Patient Name: Daniel [name]  DOB: 01-16-1919  MRN: [id number]  Age / Sex: 82 y.o., male  PCP: Wenda Low, MD Referring Physician: Lavina Hamman, MD  Reason for Consultation: Establishing goals of care and Psychosocial/spiritual support  HPI/Patient Profile: 82 y.o. male  with past medical history of NHL, small cell B Lymphoma, right sided hydronephrosis with indwelling cath, BPH, GERD, CKD-III, dCHF, and bronchiectasis with chronic cough who was admitted on 10/18/2016 after falling at his SNF.  Etiology of fall is unclear, though possibly due to vasovagal syncope, weakness, and/or sepsis due to UTI. He is presently being treated for sepsis with IV abx.  Clinical Assessment and Goals of Care: I met Mr. Daniello at his bedside, with his son--Bill--present. I introduced myself and explained my role on his care team, emphasizing that my goal would be to help provide relief from the symptoms and stress from serious illness, and working with him and his son on clarifying goals of care and helping make care decisions. He had, fortunately, just had his batteries replaced on his hearing aids and and was able to be fully participatory in our conversation. We discussed his perception of his health leading up to hospitalization, his understanding of his current hospitalization, and his goals/expectations for after discharge.  In talking about his health prior to hospitalization he focused on his increasing debility and frustration with loss of autonomy. His poor vision and poor hearing has made him feel increasingly isolated, and his progressive weakness has reduced is self care capacity. He has been at [facility], where he feels disconnected from the other residents and does not feel mentally engaged by the staff. His son reports that he is more of an introvert and has been reticent to  reach out to others, which results in him being alone and not opting to join available activities. Furthermore, Mr. Hetz reports that he hasn't been eating much (due to disinterest in available food, no appetite, and displeasure with not being able to see the food), and has lost weight and exercise tolerance as a result.   He understands that he is now being hospitalized for an infection (presumed to be related to a UTI). He did not know or seem to understand the reason his Ibrutinib was stopped, and seemed surprised that it could have the side effect of pancytopenia. I discussed that his Oncologist, Dr. Alvy Bimler, had made the decision to hold the medication until he recovers from sepsis. He was very focused on discussing resumption of the medication with her when that possibility presented itself. He reports improvement in his cancer since starting the medication, and would like to continue it if possible.   Finally, we discussed his goals for the future. He had a hard time identifying what brought him joy, and eventually landed on the ability to get up each morning and have the potential of a good conversation. He struggled to discuss what 'quality of life' meant for him, as he felt each day was  a struggle to live with his vision/hearing/mobility limitations. He did clearly communicate that he wants to work to optimize whatever he has left--if that means more Pt, OT, etc--he wants to try interventions that promote his autonomy. I did discuss with his son some potential future scenarios, which included the possibility of further infections, he may [at some point] not qualify for cancer treatment, and worsening functional status as his oral intake doesn't improve and he becomes weaker. His son was very realistic and acknowledged these potentials. He had apparently reached out to Hospice last month, but they felt his father did not qualify at that point. In light of his father's future plans: getting stronger  with rehab and pursuing further cancer treatment,  he would not qualify for Hospice at this juncture. He would be best served by SNF/rehab with Palliative Care.  Primary Decision Maker PATIENT   SUMMARY OF RECOMMENDATIONS   -Agree with discharge to SNF with rehab when deemed appropriate per primary team, would encourage Palliative Care at SNF, which was discussed with pt and son  -Given high infection risk with indwelling urinary catheter, I will reach out to his Urologist Dr. Diona Fanti about alternative management options (call placed and message left) -I will follow-up with pt and son tomorrow afternoon for ongoing support and goals of care discussions  Code Status/Advance Care Planning:  DNR  Additional Recommendations (Limitations, Scope, Preferences):  Full Scope Treatment  Psycho-social/Spiritual:   Desire for further Chaplaincy support:no  Additional Recommendations: none presently  Prognosis:   Unable to determine  Discharge Planning: [place] for rehab with Palliative care service follow-up      Primary Diagnoses: Present on Admission: . Chronic diastolic CHF (congestive heart failure) ([place]) . Acute renal failure superimposed on stage 3 chronic kidney disease ([place]) . Anemia in neoplastic disease . Hydroureteronephrosis . Thrombocytopenia ([place]) . UTI (urinary tract infection) . Fall . BPH (benign prostatic hyperplasia) . GERD (gastroesophageal reflux disease) . ACHALASIA . Lymphoma, small lymphocytic ([place]) . Syncope   I have reviewed the medical record, interviewed the patient and family, and examined the patient. The following aspects are pertinent.  Past Medical History:  Diagnosis Date  . Anemia   . B12 deficiency 06/06/2016  . BPH (benign prostatic hyperplasia)   . CKD (chronic kidney disease), stage III   . Congenital malformation of spine    T12  . Cough for last 2 years   occasional white sputum  . Diverticulosis   . GERD with  esophagitis   . History of kidney stones 2013  . HOH (hard of hearing)   . Hx of hydronephrosis   . Hydronephrosis 10/27/2014  . NHL (non-Hodgkin's lymphoma) ([place])    nhl dx 5/09;  . Pneumonia   . Prostate enlargement   . Sleep apnea   . Small cell B-cell lymphoma [facility])    Social History   Social History  . Marital status: Widowed    Spouse name: N/A  . Number of children: N/A  . Years of education: N/A   Occupational History  . retired Retired   Social History Main Topics  . Smoking status: Former Smoker    Packs/day: 0.25    Years: 3.00    Types: Pipe    Quit date: 12/10/1959  . Smokeless tobacco: Never Used     Comment: Pt states that he never inhaled. Pt also states that while in service he smoked cigs x 2 months.   . Alcohol use 0.6 oz/week  1 Glasses of wine per week     Comment: occasional  . Drug use: No  . Sexual activity: No   Other Topics Concern  . None   Social History Narrative  . None   Family History  Problem Relation Age of Onset  . Tuberculosis Mother   . Blindness Father    Scheduled Meds: . Chlorhexidine Gluconate Cloth  6 each Topical Q0600  . docusate sodium  100 mg Oral BID  . feeding supplement (ENSURE ENLIVE)  237 mL Oral TID BM  . feeding supplement (PRO-STAT SUGAR FREE 64)  30 mL Oral BID  . finasteride  5 mg Oral Q breakfast  . mirabegron ER  50 mg Oral Daily  . multivitamin  1 tablet Oral BID  . mupirocin ointment  1 application Nasal BID  . pantoprazole  40 mg Oral Daily  . piperacillin-tazobactam (ZOSYN)  IV  2.25 g Intravenous Q6H  . polyvinyl alcohol  1 drop Both Eyes BID  . sodium chloride flush  3 mL Intravenous Q12H  . Vitamin D (Ergocalciferol)  50,000 Units Oral Once per day on Mon Thu   Continuous Infusions: . sodium chloride 1,000 mL (10/21/16 1136)   PRN Meds:.acetaminophen **OR** acetaminophen, albuterol, artificial tears, bisacodyl, menthol-cetylpyridinium, ondansetron **OR** ondansetron (ZOFRAN) IV,  oxybutynin, polyethylene glycol, zolpidem No Known Allergies   Review of Systems  Constitutional: Positive for appetite change, fatigue and unexpected weight change.  HENT: Positive for congestion, hearing loss and trouble swallowing.        Poor hearing  Eyes:       Blind  Respiratory: Positive for shortness of breath. Negative for chest tightness.   Cardiovascular: Negative for chest pain.  Gastrointestinal: Negative for abdominal pain, constipation, diarrhea, nausea and vomiting.  Genitourinary:       Indwelling urinary catheter  Musculoskeletal: Positive for gait problem. Negative for back pain.  Skin: Positive for pallor.  Neurological: Positive for dizziness and weakness.  Hematological: Bruises/bleeds easily.   Physical Exam  Constitutional: He is oriented to person, place, and time. He has a sickly appearance.  Thin and frail  Eyes: EOM are normal.  Neck: Normal range of motion.  Pulmonary/Chest: He has wheezes.  Increased WOB with prolonged conversation. Persistent wet cough.  Musculoskeletal: Normal range of motion.  Neurological: He is alert and oriented to person, place, and time.  Skin: Skin is warm and dry.  Bruising present all over body. Skin very thin.  Psychiatric: His speech is normal. Judgment and thought content normal. His affect is blunt. He is agitated. Cognition and memory are normal.    Vital Signs: BP (!) 116/47 (BP Location: Right Arm)   Pulse 89   Temp 97.6 F (36.4 C) (Oral)   Resp (!) 22   Ht '5\' 5"'  (1.651 m)   Wt 58.3 kg (128 lb 8 oz)   SpO2 100%   BMI 21.38 kg/m  Pain Assessment: 0-10   Pain Score: 0-No pain   SpO2: SpO2: 100 % O2 Device:SpO2: 100 % O2 Flow Rate: .   IO: Intake/output summary:  Intake/Output Summary (Last 24 hours) at 10/22/16 1614 Last data filed at 10/22/16 1500  Gross per 24 hour  Intake          2845.33 ml  Output             1500 ml  Net          1345.33 ml    LBM: Last BM Date: 10/18/16 Baseline  Weight:  Weight: 54 kg (119 lb) Most recent weight: Weight: 58.3 kg (128 lb 8 oz)     Palliative Assessment/Data:  PPS 60%   Flowsheet Rows   Flowsheet Row Most Recent Value  Intake Tab  Referral Department  Hospitalist  Unit at Time of Referral  Cardiac/Telemetry Unit  Palliative Care Primary Diagnosis  Cancer  Date Notified  10/21/16  Palliative Care Type  New Palliative care  Reason for referral  Clarify Goals of Care  Date of Admission  10/18/16  # of days IP prior to Palliative referral  3  Clinical Assessment  Psychosocial & Spiritual Assessment  Palliative Care Outcomes      Time In: 1500 Time Out: 1620 Time Total: 80 minutes Greater than 50%  of this time was spent counseling and coordinating care related to the above assessment and plan.  Signed by: Charlynn Court NP Palliative Medicine Team Team Phone # [phone] (Nights/Weekends)

## 2016-10-22 NOTE — Care Management Important Message (Signed)
Important Message  Patient Details  Name: Daniel [name] MRN: [id number] Date of Birth: July 03, 1919   Medicare Important Message Given:  Yes    Bethena Roys, RN 10/22/2016, 4:01 PM

## 2016-10-23 DIAGNOSIS — N179 Acute kidney failure, unspecified: Secondary | ICD-10-CM

## 2016-10-23 DIAGNOSIS — A419 Sepsis, unspecified organism: Principal | ICD-10-CM

## 2016-10-23 DIAGNOSIS — N183 Chronic kidney disease, stage 3 (moderate): Secondary | ICD-10-CM

## 2016-10-23 DIAGNOSIS — J479 Bronchiectasis, uncomplicated: Secondary | ICD-10-CM

## 2016-10-23 LAB — CBC WITH DIFFERENTIAL/PLATELET
BASOS ABS: 0 10*3/uL (ref 0.0–0.1)
Basophils Relative: 0 %
EOS ABS: 0 10*3/uL (ref 0.0–0.7)
EOS PCT: 1 %
HCT: 24.8 % — ABNORMAL LOW (ref 39.0–52.0)
Hemoglobin: 7.7 g/dL — ABNORMAL LOW (ref 13.0–17.0)
Lymphocytes Relative: 41 %
Lymphs Abs: 1.4 10*3/uL (ref 0.7–4.0)
MCH: 26.6 pg (ref 26.0–34.0)
MCHC: 31 g/dL (ref 30.0–36.0)
MCV: 85.5 fL (ref 78.0–100.0)
MONO ABS: 0.3 10*3/uL (ref 0.1–1.0)
Monocytes Relative: 9 %
NEUTROS PCT: 49 %
Neutro Abs: 1.7 10*3/uL (ref 1.7–7.7)
PLATELETS: 52 10*3/uL — AB (ref 150–400)
RBC: 2.9 MIL/uL — AB (ref 4.22–5.81)
RDW: 18.3 % — ABNORMAL HIGH (ref 11.5–15.5)
WBC: 3.4 10*3/uL — AB (ref 4.0–10.5)

## 2016-10-23 LAB — RENAL FUNCTION PANEL
ALBUMIN: 1.9 g/dL — AB (ref 3.5–5.0)
Anion gap: 5 (ref 5–15)
BUN: 38 mg/dL — ABNORMAL HIGH (ref 6–20)
CALCIUM: 8.2 mg/dL — AB (ref 8.9–10.3)
CO2: 24 mmol/L (ref 22–32)
CREATININE: 1.65 mg/dL — AB (ref 0.61–1.24)
Chloride: 113 mmol/L — ABNORMAL HIGH (ref 101–111)
GFR, EST AFRICAN AMERICAN: 39 mL/min — AB (ref >60)
GFR, EST NON AFRICAN AMERICAN: 34 mL/min — AB (ref >60)
Glucose, Bld: 87 mg/dL (ref 65–99)
PHOSPHORUS: 2.4 mg/dL — AB (ref 2.5–4.6)
Potassium: 3.9 mmol/L (ref 3.5–5.1)
SODIUM: 142 mmol/L (ref 135–145)

## 2016-10-23 MED ORDER — MIRTAZAPINE 15 MG PO TBDP
7.5000 mg | ORAL_TABLET | Freq: Every day | ORAL | Status: DC
  Administered 2016-10-23: 7.5 mg via ORAL
  Filled 2016-10-23 (×2): qty 0.5

## 2016-10-23 NOTE — Progress Notes (Signed)
Speech Language Pathology Treatment: Dysphagia  Patient Details Name: Daniel [name] MRN: [id number] DOB: [birth year] Today's Date: 10/23/2016 Time: [id number] SLP Time Calculation (min) (ACUTE ONLY): 12 min  Assessment / Plan / Recommendation Clinical Impression  Pt consumed trials of thin liquids and pureed solids alternating between bites and sip. Delayed coughing was observed after completion of PO intake but without regurgitation. He reported that he is feeling much better and is ready to leave the hospital. SLP provided education for swallowing/diet recommendations and pt agreed. Recommend continuation of Dys 1 and thin liquids. Refrain from cold drinks and alternate b/w solids and liquids during meals. Will continue to follow acutely.   HPI HPI: Daniel Perkinson Smithis a 82 y.o.malewith medical history significant of indwelling cath, NHL, small cell B-lymphoma, right-sided hydronephrosis, poor hearing wearing hearing aids, BPH, GERD, CKD-III, dCHF, mild bronchiectasis with chronic cough, who presents with fall.   Per report, pt fell in SNF. He reports to me and also to RN in ED that his fall happed after had bowel movement and slipped in bathroom.  The patient does complain of trouble swallowing.  He reported that about 3 years ago he had botex injections for a "small esophagus" and that he recently saw the GI doctor again but they are not able to repeat the botox injections.  He reported that his current symptoms are similar to just prior to his botox injections 3 years ago.  Most recent chest xray is negative for acute findings, however, the patient is dehydrated with a bolus of fluid just started.  Head CT is negative for acute findings.  The patient has had no previously documented swallowing evaluation at [facility].        SLP Plan  Continue with current plan of care     Recommendations  Diet recommendations: Dysphagia 1 (puree);Thin liquid Liquids provided via: Straw;Cup Medication  Administration: Crushed with puree Supervision: Full supervision/cueing for compensatory strategies;Staff to assist with self feeding Compensations: Minimize environmental distractions;Slow rate;Small sips/bites;Follow solids with liquid;Other (Comment) (no cold drinks) Postural Changes and/or Swallow Maneuvers: Seated upright 90 degrees;Upright 30-60 min after meal                Oral Care Recommendations: Oral care BID Follow up Recommendations: Skilled Nursing facility Plan: Continue with current plan of care       Daniel [name], Student SLP  Daniel [name] 10/23/2016, 12:12 PM

## 2016-10-23 NOTE — Progress Notes (Signed)
Daily Progress Note   Patient Name: Daniel [name]       Date: 10/23/2016 DOB: 1919-03-28  Age: 82 y.o. MRN#: [id number] Attending Physician: Modena Jansky, MD Primary Care Physician: Wenda Low, MD Admit Date: 10/18/2016  Reason for Consultation/Follow-up: Establishing goals of care and Non pain symptom management  Subjective: Daniel [name] is much more despondent today. He endorses poor sleep overnight, and resultant fatigue today. He also verbalizes a strong desire to want to die. He feels his life lacks purpose, he has no joy, and the world would be better if he died in his sleep.   Length of Stay: 4  Current Medications: Scheduled Meds:  . Chlorhexidine Gluconate Cloth  6 each Topical Q0600  . dextromethorphan-guaiFENesin  1 tablet Oral BID  . docusate sodium  100 mg Oral BID  . feeding supplement (ENSURE ENLIVE)  237 mL Oral TID BM  . feeding supplement (PRO-STAT SUGAR FREE 64)  30 mL Oral BID  . finasteride  5 mg Oral Q breakfast  . levofloxacin (LEVAQUIN) IV  500 mg Intravenous Q48H  . mirabegron ER  50 mg Oral Daily  . mirtazapine  7.5 mg Oral QHS  . multivitamin  1 tablet Oral BID  . pantoprazole  40 mg Oral Daily  . polyvinyl alcohol  1 drop Both Eyes BID  . sodium chloride flush  3 mL Intravenous Q12H  . Vitamin D (Ergocalciferol)  50,000 Units Oral Once per day on Mon Thu    Continuous Infusions:   PRN Meds: acetaminophen **OR** acetaminophen, albuterol, artificial tears, bisacodyl, menthol-cetylpyridinium, ondansetron **OR** ondansetron (ZOFRAN) IV, oxybutynin, polyethylene glycol  Physical Exam   Constitutional: He is oriented to person, place, and time. He has a sickly appearance.  Thin and frail  Eyes: EOM are normal.  Neck: Normal range of motion.  Pulmonary/Chest: He has wheezes.  Increased WOB with  prolonged conversation. Persistent wet cough.  Musculoskeletal: Normal range of motion.  Neurological: He is alert and oriented to person, place, and time.  Skin: Skin is warm and dry.  Bruising present all over body. Skin very thin.  Psychiatric: His speech is normal. Judgment and thought content normal. His affect is blunt. He is agitated/axious. Cognition and memory are normal.          Vital Signs: BP (!) 94/47 (BP Location: Right Arm)   Pulse 83   Temp 97.8 F (36.6 C) (Oral)   Resp (!) 24   Ht '5\' 5"'$  (1.651 m)   Wt 61.6 kg (135 lb 14.4 oz)   SpO2 99%   BMI 22.61 kg/m  SpO2: SpO2: 99 % O2 Device: O2 Device: Not Delivered O2 Flow Rate:    Intake/output summary:  Intake/Output Summary (Last 24 hours) at 10/23/16 1508 Last data filed at 10/22/16 1724  Gross per 24 hour  Intake              240 ml  Output              250 ml  Net              -10 ml   LBM: Last BM Date: 10/20/16 Baseline Weight: Weight: 54 kg (119 lb) Most  recent weight: Weight: 61.6 kg (135 lb 14.4 oz)       Palliative Assessment/Data:  PPS 60%   Flowsheet Rows   Flowsheet Row Most Recent Value  Intake Tab  Referral Department  Hospitalist  Unit at Time of Referral  Cardiac/Telemetry Unit  Palliative Care Primary Diagnosis  Cancer  Date Notified  10/21/16  Palliative Care Type  New Palliative care  Reason for referral  Clarify Goals of Care  Date of Admission  10/18/16  # of days IP prior to Palliative referral  3  Clinical Assessment  Psychosocial & Spiritual Assessment  Palliative Care Outcomes      Patient Active Problem List   Diagnosis Date Noted  . Aspiration pneumonia (HCC) 10/22/2016  . Sepsis (HCC) 10/22/2016  . Muscle weakness (generalized)   . Palliative care encounter   . Goals of care, counseling/discussion   . Syncope 10/18/2016  . Fall 10/18/2016  . BPH (benign prostatic hyperplasia) 10/18/2016  . GERD (gastroesophageal reflux disease) 10/18/2016  . AKI (acute  kidney injury) (HCC) 07/28/2016  . Hypotension 07/28/2016  . Chronic diastolic CHF (congestive heart failure) (HCC) 07/28/2016  . Facial laceration   . UTI (lower urinary tract infection)   . UTI (urinary tract infection) 06/15/2016  . Acute renal failure (HCC) 06/14/2016  . Acute renal failure (ARF) (HCC) 06/14/2016  . Bilateral leg edema 06/07/2016  . Pancytopenia, acquired (HCC) 06/06/2016  . B12 deficiency 06/06/2016  . Acute respiratory failure (HCC) 03/19/2016  . Protein-calorie malnutrition, severe 03/15/2016  . Hypoxia 03/14/2016  . Lesion of left lung 03/07/2016  . Protein-calorie malnutrition, moderate (HCC) 03/07/2016  . Thrombocytopenia (HCC) 06/01/2015  . Hernia, inguinal, right 06/01/2015  . Essential hypertension 06/01/2015  . Acute renal failure superimposed on stage 3 chronic kidney disease (HCC) 01/10/2015  . 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 Preventive measure 11/14/2014  . Hydronephrosis, right 10/27/2014  . Cellulitis of arm, right 02/11/2014  . Bronchiectasis without acute exacerbation (HCC) 06/09/2013  . Hydroureteronephrosis 05/12/2012  . Lymphoma, small lymphocytic (HCC) 11/10/2011  . 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 CHRONIC AIRWAY OBSTRUCTION NEC 09/11/2009    Palliative Care Assessment & Plan   HPI:  82 y.o. male  with past medical history of NHL, small cell B Lymphoma, right sided hydronephrosis with indwelling cath, BPH, GERD, CKD-III, dCHF, and bronchiectasis with chronic cough who was admitted on 10/18/2016 after falling at his SNF.  Etiology of fall is unclear, though possibly due to vasovagal syncope, weakness, and/or sepsis due to UTI. He is presently being treated for sepsis and aspiration PNA with IV abx.  Assessment: I met with Daniel [name] and his son, Annette Stable, for a long time yesterday and we explored his perception of his health state in the recent past, currently, and his expectations for the future. In that conversation he expressed strong  frustration with his increasingly debility, mostly surrounding his poor vision and impaired mobility, but was clear on his goal to optimize the function he has left and pursue interventions that may improve the quality of his life. He wished to continue treatment for his underlying malignancy, if available, and looked forward to more physical therapy to optimize and hopefully regain some physical independence.   Today, Mr. Harriott was extremely despondent and verbalized a desire to "just die in my sleep." He expressed guilt that his son had to care for him, and that he required support at a SNF. As this was a stark difference from yesterday--where he was quite hopeful and  focused on improvement--I tried to explore what had changed. He eventually explained that his poor sleep made it harder to be engaged during the day, and he felt like he was slowly dying in the bed as he was unable to get out of it and move around. He felt "trapped in bed, lying here all day, and it would be better if I just died in my sleep." I have strong concern, as did his son, that he was becoming depressed by this hospitalization, and his coping capacity was strained by his lack of sleep. I emphasized his right to make decisions about his care, noting that comfort care was a possibility. I also explored possible options to improve his day: working to improve his sleep, assisted to be out of bed for meals, and being wheeled out of his room in a wheelchair to get out of bed and see more people. He considered these options and wanted to try the latter interventions.   Recommendations/Plan:  Start Remeron 7.'5mg'$  qHS for sleep and appetite stimulation (he denies need for appetite component, however he noted lack of appetite and weight loss yesterday). Could also consider Zyprexa 2.'5mg'$  qHS if Remeron not tolerated.  Discussed importance of being OOB for meals and getting out of the room with care RN; orders placed  Chaplain consulted; Mr.  Feil endorses a faith in God and would benefit from spiritual support as he grapples with his life purpose  Palliative will re-evaluate tomorrow; comfort care is not unreasonable for Mr. Khachatryan, however his dramatic shift in attitude from yesterday to today warrants attempting the above interventions first  Goals of Care and Additional Recommendations:  Limitations on Scope of Treatment: Full Scope Treatment  Code Status:  DNR  Prognosis:   Unable to determine  Discharge Planning:  [facility] for rehab with Palliative care service follow-up  Care plan was discussed with Pt, pt's son, and care RN  Thank you for allowing the Palliative Medicine Team to assist in the care of this patient.   Time In: 1450 Time Out: 1520 Total Time 30 minutes Prolonged Time Billed  no       Greater than 50%  of this time was spent counseling and coordinating care related to the above assessment and plan.  Charlynn Court, NP Palliative Medicine Team Team Phone # [phone]

## 2016-10-23 NOTE — Progress Notes (Signed)
[facility] Progress Note  Patient: Daniel [name] [id number]   PCP: Wenda Low, MD DOB: 1919/02/21   DOA: 10/18/2016   DOS: 10/23/2016   Date of Service: the patient was seen and examined on 10/23/2016  Brief hospital course: Pt. with PMH of indwelling Foley cath, NHL, small cell B-lymphoma, right-sided hydronephrosis, poor hearing wearing hearing aids, BPH, GERD, CKD-III, dCHF, mild bronchiectasis with chronic cough; admitted on 10/18/2016, with complaint of fall, was found to have Sepsis due to UTI.   Assessment and Plan: 1.Fall: Etiology is not clear. CT head is negative for intracranial abnormalities. CT cervical spine negative for bony fracture. X-ray of left knee and pelvis without acute bony abnormalities. CT maxillofacial negative for bony abnormalities but showed right supraorbital scalp hematoma. No focal neurological findings, less likely to have stroke. Patient denies LOC and hence syncope also seems less likely. May be related to his frailness and deconditioning complicated by UTI. Telemetry shows sinus rhythm with BBB morphology. Positive for hypotension and orthostatic changes on 10/19/16. Recheck orthostatic blood pressures. Extensive bruising over face and upper trunk.  2. Sepsis due to UTI (urinary tract infection):   Urine culture this admission 2: Suggest contamination. Blood cultures 2: Negative to date. In the past Patient had positive urinalysis with Pseudomonas with pansensitivity on 07/28/16. Initially placed on IV Zosyn which was changed to levofloxacin on 10/22/16  3. Aspiration pneumonia. History of achalasia CT scan of the chest was performed to identify any possible aspiration event. CT scan shows evidence of multiple focal pneumonia. Also aspiration likely. Dr. Posey Pronto discussed with patient as well as son: They would like to continue with the least restrictive diet. Saying that the patient has been aspirating for last 30 years. Speech therapy  consulted who recommends dysphagia type I at thin liquid. As per family, patient Recently saw Dr. Ardis Hughs from GI, who felt that patient is not a candidate for any repeat Botox injection.  4. Bronchiectasis with acute exacerbation:  Patient has chronic cough. He has been follow-uped by pulmonologist.  -will continue Pepcid and Protonix for GERD -prn albuterol nebulizer -Mucinex for cough -Follow-up with pulmonology  5. AoCKD-III and hx of hydronephrosis:  Baseline Cre is 1.3-1.6, pt's Cre was 2.37 and BUN 55 on admission.  Likely due to prerenal secondary to dehydration.  Pt has hx of R hydronephrosis. With worsening per ultrasound - Follow up renal function. Creatinine has gradually improved to 1.65 and probably baseline. Follow BMP in a.m. - Highly appreciate Dr. Lynne Logan recommendation, who says that avoiding any procedure due to patient's advanced age as well as desire to avoid aggressive intervention.  6. 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 Alvy Bimler. Currently on imbruvica -stop Imbruvica Anemia stable, thrombocytopenia fluctuating but worse than in August. Patient has bruises, but no active bleeding.  Mental status is normal-->No TTP. Likely from chemotherapy medication. Currently is on hold. -f/u by CBC in a.m.  7. Chronic diastolic CHF (congestive heart failure) ([facility]): 2-D echo on 08/01/16 showed EF 60-65% with grade 1 diastolic dysfunction. Patient is not on diuretics. No leg edema or JVD, CHF is compensated on admission. -normal BNP  8. BPH (benign prostatic hyperplasia): -continue Proscar, oxybutynin, Myrbetriq  9. GERD (gastroesophageal reflux disease): -Pepcid and Protonix  10. Skin tear and bruises -wound care consult  11. Goals of care discussion. Patient currently with aspiration pneumonia and high risk for aspiration. Also with bilateral hydronephrosis due to BPH and  now requiring  chronic Foley catheter due to acute kidney injury on presentation. Also has recurrent UTI without the catheter prior to this admission. Not a candidate for any procedure for hydronephrosis or achalasia due to advance age and physical deconditioning. Has non-Hodgkin's lymphoma as well. With all this findings Dr. Posey Pronto discussed with patient's son as well as patient regarding goals of care. He mentioned that right now they would like to focus on getting him better from the infection and going back to rehabilitation for therapy. Should he have reoccurrence of infection or worsening at that point they will consider hospice. Palliative care team did see him in the hospital: Agree with DC to SNF when appropriate, palliative care follow-up at SNF. Palliative care team plans to follow-up 10/23/16.  Pain management: When necessary Tylenol Activity: Consulted physical therapy and recommend SNF Bowel regimen: last BM 10/18/2016 Diet: Dysphagia 1 diet and thin liquids. DVT Prophylaxis: subcutaneous Heparin  Advance goals of care discussion: DNR/DNI  Family Communication: no family was present at bedside, at the time of interview.  Disposition:  Discharge to SNF. Expected discharge date: 1-2 days.  Consultants: Urologist, palliative care team Procedures: Ultrasound renal  Antibiotics: Anti-infectives    Start     Dose/Rate Route Frequency Ordered Stop   10/22/16 1730  levofloxacin (LEVAQUIN) IVPB 500 mg     500 mg 100 mL/hr over 60 Minutes Intravenous Every 48 hours 10/22/16 1647     10/22/16 1645  amoxicillin-clavulanate (AUGMENTIN) 500-125 MG per tablet 500 mg  Status:  Discontinued     1 tablet Oral Every 8 hours 10/22/16 1637 10/22/16 1638   10/22/16 0900  piperacillin-tazobactam (ZOSYN) IVPB 2.25 g  Status:  Discontinued     2.25 g 100 mL/hr over 30 Minutes Intravenous Every 6 hours 10/22/16 0825 10/22/16 1636   10/19/16 0300  piperacillin-tazobactam (ZOSYN) IVPB 2.25 g  Status:   Discontinued     2.25 g 100 mL/hr over 30 Minutes Intravenous Every 6 hours 10/18/16 1944 10/22/16 0823   10/18/16 1930  piperacillin-tazobactam (ZOSYN) IVPB 2.25 g     2.25 g 100 mL/hr over 30 Minutes Intravenous  Once 10/18/16 1927 10/18/16 2209   10/18/16 1845  cefTRIAXone (ROCEPHIN) 1 g in dextrose 5 % 50 mL IVPB  Status:  Discontinued     1 g 100 mL/hr over 30 Minutes Intravenous  Once 10/18/16 1831 10/18/16 1925     Subjective: Patient states that he is gradually improving and feels better. Apart from not being able to sleep at night since admission (states that he slept well at home), denies any other complaints and is asking for a sleeping aid. Chronic dry cough. No chest pain or pain elsewhere.  Objective: Physical Exam: Vitals:   10/23/16 0009 10/23/16 0615 10/23/16 0758 10/23/16 1128  BP: (!) 105/52 102/62 (!) 103/54 (!) 94/47  Pulse: 77 73 75 83  Resp: 14 13 18  (!) 24  Temp: 97.6 F (36.4 C) 97.9 F (36.6 C) 97.5 F (36.4 C) 97.8 F (36.6 C)  TempSrc: Oral Axillary Oral Oral  SpO2: 100% 98% 98% 99%  Weight:  61.6 kg (135 lb 14.4 oz)    Height:        Intake/Output Summary (Last 24 hours) at 10/23/16 1453 Last data filed at 10/22/16 1724  Gross per 24 hour  Intake              462 ml  Output  250 ml  Net              212 ml   Filed Weights   10/21/16 0443 10/22/16 0600 10/23/16 0615  Weight: 59.1 kg (130 lb 4.8 oz) 58.3 kg (128 lb 8 oz) 61.6 kg (135 lb 14.4 oz)    General: Alert, Awake and Oriented to Time, Place and Person. Lying comfortably propped up in bed without distress. Eyes: PERRL, Conjunctiva normal. Extensive bruising around right eye, right side of forehead. ENT: Oral Mucosa clear moist. Neck: no JVD, no Abnormal Mass Or lumps Cardiovascular: S1 and S2 Present, no Murmur, telemetry: Sinus rhythm with BBB morphology. Respiratory: Diminished breath sounds in the bases with occasional crackles but otherwise clear to auscultation. No  increased work of breathing. Abdomen: Bowel Sound present, Soft and no tenderness. Foley catheter +. Skin: Extensive bruising of the face as indicated above and upper trunk. Wound over left knee with dressing. Extremities: Trace bilateral leg edema. Neurologic: Grossly no focal neuro deficit. Bilaterally Equal motor strength  Data Reviewed: CBC:  Recent Labs Lab 10/18/16 1634  10/20/16 1015 10/21/16 0439 10/21/16 1044 10/22/16 0343 10/23/16 0402  WBC 5.5  < > 2.9* 3.2* 3.1* 2.7* 3.4*  NEUTROABS 3.2  --  1.4* 1.5*  --  1.2* 1.7  HGB 9.0*  < > 7.8* 7.3* 7.9* 7.5* 7.7*  HCT 29.0*  < > 25.4* 23.4* 25.9* 24.5* 24.8*  MCV 83.1  < > 84.7 85.7 86.3 84.8 85.5  PLT 81*  < > 69* 54* 45* 66* 52*  < > = values in this interval not displayed. Basic Metabolic Panel:  Recent Labs Lab 10/19/16 0330 10/20/16 1015 10/21/16 0439 10/22/16 0343 10/23/16 0402  NA 139 140 141 142 142  K 4.0 3.6 3.4* 3.8 3.9  CL 107 111 112* 115* 113*  CO2 24 21* 22 23 24   GLUCOSE 120* 129* 88 107* 87  BUN 52* 40* 42* 40* 38*  CREATININE 2.17* 1.94* 1.89* 1.79* 1.65*  CALCIUM 8.9 8.3* 7.8* 8.0* 8.2*  PHOS  --  2.7 2.4* 2.4* 2.4*    Liver Function Tests:  Recent Labs Lab 10/18/16 1634 10/20/16 1015 10/21/16 0439 10/22/16 0343 10/23/16 0402  AST 22  --   --   --   --   ALT 16*  --   --   --   --   ALKPHOS 137*  --   --   --   --   BILITOT 1.1  --   --   --   --   PROT 4.3*  --   --   --   --   ALBUMIN 2.4* 1.9* 1.7* 1.7* 1.9*   No results for input(s): LIPASE, AMYLASE in the last 168 hours. No results for input(s): AMMONIA in the last 168 hours. Coagulation Profile: No results for input(s): INR, PROTIME in the last 168 hours. Cardiac Enzymes: No results for input(s): CKTOTAL, CKMB, CKMBINDEX, TROPONINI in the last 168 hours. BNP (last 3 results) No results for input(s): PROBNP in the last 8760 hours.  CBG: No results for input(s): GLUCAP in the last 168 hours.  Studies: No results found.    Scheduled Meds: . Chlorhexidine Gluconate Cloth  6 each Topical Q0600  . dextromethorphan-guaiFENesin  1 tablet Oral BID  . docusate sodium  100 mg Oral BID  . feeding supplement (ENSURE ENLIVE)  237 mL Oral TID BM  . feeding supplement (PRO-STAT SUGAR FREE 64)  30 mL Oral BID  . finasteride  5 mg Oral Q breakfast  . levofloxacin (LEVAQUIN) IV  500 mg Intravenous Q48H  . mirabegron ER  50 mg Oral Daily  . multivitamin  1 tablet Oral BID  . pantoprazole  40 mg Oral Daily  . polyvinyl alcohol  1 drop Both Eyes BID  . sodium chloride flush  3 mL Intravenous Q12H  . Vitamin D (Ergocalciferol)  50,000 Units Oral Once per day on Mon Thu   Continuous Infusions:  PRN Meds: acetaminophen **OR** acetaminophen, albuterol, artificial tears, bisacodyl, menthol-cetylpyridinium, ondansetron **OR** ondansetron (ZOFRAN) IV, oxybutynin, polyethylene glycol, zolpidem  Fermon Ureta, MD, FACP, FHM. [facility] Pager [phone]  If 7PM-7AM, please contact night-coverage [email or web address] Password [email or web address] 10/23/2016, 3:08 PM

## 2016-10-23 NOTE — Progress Notes (Signed)
   10/23/16 1550  Clinical Encounter Type  Visited With Patient  Visit Type Initial;Spiritual support  Referral From Chaplain  Consult/Referral To Chaplain  Spiritual Encounters  Spiritual Needs Emotional  Stress Factors  Patient Stress Factors Major life changes;Other (Comment) (life review, life story)  Family Stress Factors Family relationships  Pt.shared life story, has a son that is his caretaker. Pt. Has a faith community of support that has also visited with him,has accepted end of life in reference to aging and health, has a personal peace within surrounding death or dying, no signs of sadness, nor spoke about regrets but that he has gratitude towards his God and was thankful for the life he has lived.  Chaplain gave a ministry of presence, emotional support, spiritual support.  [employer]  [phone]

## 2016-10-24 ENCOUNTER — Telehealth: Payer: Self-pay | Admitting: Adult Health

## 2016-10-24 DIAGNOSIS — I5032 Chronic diastolic (congestive) heart failure: Secondary | ICD-10-CM

## 2016-10-24 DIAGNOSIS — R627 Adult failure to thrive: Secondary | ICD-10-CM

## 2016-10-24 DIAGNOSIS — Z66 Do not resuscitate: Secondary | ICD-10-CM

## 2016-10-24 DIAGNOSIS — J690 Pneumonitis due to inhalation of food and vomit: Secondary | ICD-10-CM

## 2016-10-24 DIAGNOSIS — N1330 Unspecified hydronephrosis: Secondary | ICD-10-CM

## 2016-10-24 DIAGNOSIS — N401 Enlarged prostate with lower urinary tract symptoms: Secondary | ICD-10-CM

## 2016-10-24 LAB — RENAL FUNCTION PANEL
Albumin: 1.9 g/dL — ABNORMAL LOW (ref 3.5–5.0)
Anion gap: 4 — ABNORMAL LOW (ref 5–15)
BUN: 39 mg/dL — ABNORMAL HIGH (ref 6–20)
CALCIUM: 8.3 mg/dL — AB (ref 8.9–10.3)
CHLORIDE: 112 mmol/L — AB (ref 101–111)
CO2: 25 mmol/L (ref 22–32)
CREATININE: 1.56 mg/dL — AB (ref 0.61–1.24)
GFR calc Af Amer: 42 mL/min — ABNORMAL LOW (ref >60)
GFR calc non Af Amer: 36 mL/min — ABNORMAL LOW (ref >60)
GLUCOSE: 89 mg/dL (ref 65–99)
Phosphorus: 2.4 mg/dL — ABNORMAL LOW (ref 2.5–4.6)
Potassium: 4 mmol/L (ref 3.5–5.1)
SODIUM: 141 mmol/L (ref 135–145)

## 2016-10-24 LAB — CBC WITH DIFFERENTIAL/PLATELET
Basophils Absolute: 0 10*3/uL (ref 0.0–0.1)
Basophils Relative: 1 %
EOS ABS: 0 10*3/uL (ref 0.0–0.7)
EOS PCT: 1 %
HCT: 25.4 % — ABNORMAL LOW (ref 39.0–52.0)
Hemoglobin: 7.8 g/dL — ABNORMAL LOW (ref 13.0–17.0)
LYMPHS ABS: 1.2 10*3/uL (ref 0.7–4.0)
LYMPHS PCT: 36 %
MCH: 26.2 pg (ref 26.0–34.0)
MCHC: 30.7 g/dL (ref 30.0–36.0)
MCV: 85.2 fL (ref 78.0–100.0)
MONO ABS: 0.3 10*3/uL (ref 0.1–1.0)
MONOS PCT: 9 %
Neutro Abs: 1.8 10*3/uL (ref 1.7–7.7)
Neutrophils Relative %: 54 %
PLATELETS: 45 10*3/uL — AB (ref 150–400)
RBC: 2.98 MIL/uL — ABNORMAL LOW (ref 4.22–5.81)
RDW: 18.4 % — AB (ref 11.5–15.5)
WBC: 3.3 10*3/uL — AB (ref 4.0–10.5)

## 2016-10-24 MED ORDER — MIRTAZAPINE 15 MG PO TBDP: 7.5000 mg | ORAL_TABLET | Freq: Every day | ORAL | Status: AC

## 2016-10-24 MED ORDER — LEVOFLOXACIN IN D5W 500 MG/100ML IV SOLN
500.0000 mg | Freq: Once | INTRAVENOUS | Status: AC
  Administered 2016-10-24: 500 mg via INTRAVENOUS
  Filled 2016-10-24: qty 100

## 2016-10-24 MED ORDER — ENSURE ENLIVE PO LIQD: 237.0000 mL | Freq: Three times a day (TID) | ORAL | Status: AC

## 2016-10-24 NOTE — Telephone Encounter (Signed)
No he will not need one . Just tell him to keep ov with Dr. Vaughan Browner in 2 weeks.  Please contact office for sooner follow up if symptoms do not improve or worsen or seek emergency care

## 2016-10-24 NOTE — Telephone Encounter (Signed)
TP will the pt still need to have the CT chest done tomorrow?  Pt is being d/c from the hospital and looks like he just had the ct chest on 11/13.  Please advise so that we may call and cancel this test for tomorrow. Thanks No Known Allergies

## 2016-10-24 NOTE — Discharge Summary (Addendum)
Physician Discharge Summary  Daniel [name] YPP:[id number] DOB: April 01, 1919  PCP: Wenda Low, MD  Admit date: 10/18/2016 Discharge date: 10/24/2016   Recommendations for Outpatient Follow-up:  1. M.D. at SNF in 5-7 days with repeat labs (CBC with differential & BMP). 2. Dr. Heath Lark, Oncology in 2 weeks. MDs office will arrange follow-up in the first week of December. SNF to coordinate. 3. Dr. Benita Stabile, PCP upon discharge from SNF. 4. Recommend palliative care consultation and follow-up at SNF. 5. Recommend speech therapy consultation and follow-up regarding evaluation and management of dysphagia.  Home Health: None Equipment/Devices: None    Discharge Condition: Improved and stable but at very high risk for decline and death.  CODE STATUS: DO NOT RESUSCITATE  Diet recommendation: As per speech therapy recommendations as below:  Diet recommendations: Dysphagia 1 (puree);Thin liquid Liquids provided via: Straw;Cup Medication Administration: Crushed with puree Supervision: Full supervision/cueing for compensatory strategies;Staff to assist with self feeding Compensations: Minimize environmental distractions;Slow rate;Small sips/bites;Follow solids with liquid;Other (Comment) (no cold drinks) Postural Changes and/or Swallow Maneuvers: Seated upright 90 degrees;Upright 30-60 min after meal  Discharge Diagnoses:  Principal Problem:   Sepsis ([place]) Active Problems:   ACHALASIA   Lymphoma, small lymphocytic (HCC)   Hydroureteronephrosis   Bronchiectasis without acute exacerbation (HCC)   Anemia in neoplastic disease   Acute renal failure superimposed on stage 3 chronic kidney disease (HCC)   Thrombocytopenia (HCC)   UTI (urinary tract infection)   Chronic diastolic CHF (congestive heart failure) (HCC)   Syncope   Fall   BPH (benign prostatic hyperplasia)   GERD (gastroesophageal reflux disease)   Aspiration pneumonia (HCC)   Muscle weakness (generalized)   Palliative  care encounter   Goals of care, counseling/discussion   DNR (do not resuscitate)   Adult failure to thrive   Brief/Interim Summary: 82 y.o. male with medical history significant of indwelling cath, NHL, small cell B-lymphoma, right-sided hydronephrosis, poor hearing wearing hearing aids, BPH, GERD, CKD-III, dCHF, mild bronchiectasis with chronic cough, who presents with fall.  Per report, pt fell in SNF. He reports to that his fall happed after he had bowel movement and slipped in bathroom. He denied that he had any prodromal symptoms, no unilateral weakness, vision change. He did not have LOC. EDP documented that pt had dizziness when he stooped up after having BM. Pt has skin laceration in right eye and left knee. He has bruieses in right shoulder and multiple other places in his body. He has hx of mild bronchiectasis with chronic cough, which has not changed. He has an indwelling Foley that has been changed recently by urology. He reported mild discomfort in suprapubic area. Patient denied chest pain, fever, chills. Nausea, vomiting or diarrhea. He has hx of chalasia and reports difficulty swallowing.  ED Course: pt was found to have  positive urinalysis with large amount of leukocyte, negative troponin, WBC 5.5, hemoglobin 9.0, platelets 81, no fever, no tachycardia, no tachypnea, saturation 96%, worsening renal function, negative chest x-ray for acute abnormalities. Patient had negative CT head for acute intracranial abnormalities. CT- of C-spine is negative for bony fracture. X-ray of left knee and pelvis are negative for bony abnormalities. CT maxillofacial is negative for bony abnormalities, but showed right supraorbital scalp hematoma. Pt is placed on tele bed for obs.  Assessment and plan  1. Mechanical fall: Etiology not entirely clear. Imaging studies including CT of head and neck did not show any acute findings except right supraorbital scalp hematoma. No focal neurological deficits  and clinical exam not consistent with stroke or TIA. Based on history, it does not seem that he had syncope. His fall may be related to generalized weakness and frail 82 related to advanced age and multiple significant severe comorbidities complicated by possible infections. PT evaluated and recommended SNF. Patient has sustained multiple bruises from his fall. 2. Possible complicated UTI/catheter dependent: Urine microscopy was positive. He did complain of some suprapubic pain. Not entirely sure if this was asymptomatic bacteriuria from colonization of his urinary tract versus UTI. Urology felt that this was asymptomatic bacteriuria. Previous urine cultures had shown Pseudomonas which was pansensitive in August 2017. Patient completed total one-week of antibiotics including initial IV Zosyn followed by levofloxacin. No further antibiotics at discharge. Urine culture this admission 2 suggested contamination. Blood cultures 2: Negative to date. 3. Dysphagia, history of achalasia: Speech therapy evaluated and dietary recommendations as above. At high risk for recurrent aspirations and worsening. 4. Aspiration pneumonia: CT chest was performed which showed multifocal pneumonia. Most likely due to aspiration from dysphagia. Speech therapy made dietary recommendations. He remains at high risk for recurrent aspiration, especially given history of Achalasia. Dr. Posey Pronto discussed with patient and son and they preferred for patient to remain on least restrictive diet indicating that he has had history of aspiration for at least 30 years. As per family, patient recently saw Dr. Ardis Hughs, [facility] who felt that patient was not a candidate for repeat Botox injection. 5. Bronchiectasis without acute exacerbation: Patient has chronic cough. He follows with a pulmonologist. He was seen by them on 10/18/16. Sputum negative for AFB. Per clinic note, "previous PET in March 2017 showed decreased left central mesenteric mass. + LL  L nodule was hypermetabolic-this was felt to be inflammatory/infectious in nature from recurrent pneumonia? Aspiration". Not an active issue during this hospitalization. Completed one week of antibiotics as indicated above. Outpatient follow-up with pulmonology as needed. 6. Acute on stage III chronic kidney disease: Baseline creatinine 1.3-1.6. Presented with creatinine of 2.37. Likely prerenal secondary to dehydration. Hydrated with IV fluids. Creatinine has improved and almost returned to baseline. Periodically follow BMP as outpatient. 7. Pancytopenia: Secondary to neoplastic disease and treatment. May have been complicated by infection and antibiotics. No reported bleeding. Discussed with Dr. Alvy Bimler: No indication for transfusion at this time. Transfuse PRBC's if hemoglobin <7 g per DL and transfuse platelets if active bleeding or if platelet count <10. Outpatient follow-up with oncology. 8. Chronic diastolic CHF: 2-D echo 8/83/25 showed EF 60-65% and grade 1 diastolic dysfunction. Not on diuretics prior to admission. Clinically compensated. 9. BPH: Continue Proscar, Oxybutynin & Myrbetriq 10. GERD: Continue Pepcid and Protonix 11. NHL and small cell B lymphoma: Followed by outpatient oncology. Discussed with Dr. Alvy Bimler today who recommended stopping Imbruvica/Ibrutinib until infection is completely cleared and outpatient follow-up with her. She will arrange follow-up in the first week of December. Oncology recommended palliative care consult which has been done and patient and family at this time would wish to continue treatment, discharged to SNF for rehabilitation and outpatient follow-up with oncology. 12. Skin tears and bruises: Related to recent fall. Supportive treatment. Wound care consulted and DC their recommendations regarding management (note as below). 71. Chronic urinary retention: Urology consulted in the hospital. Continue urethral catheter drainage. 14. Bilateral  hydroureteronephrosis: Neurology consulted in the hospital. He has chronic bilateral hydroureteronephrosis. On review of CT scanning, a right-sided dilatation is stable from prior imaging studies. Even left-sided hydronephrosis is not markedly changed from prior CT in  November 2016. He does not have an obstructing stone or other cause of acute complete obstruction. Urology felt that his acute kidney injury may be prerenal. Furthermore, considering his advanced age, overall fragility, and other medical problems along with his long-standing desire to avoid aggressive urology interventions, they recommended continued monitoring of his hydronephrosis in the absence of infection or clear signs of systemic infection. 15. Severe malnutrition in the context of chronic illness: As per dietitian consultation and input. 16. Orthostatic hypotension: Related to generalized frailty, decreased muscle mass, possible poor oral intake and dehydration on admission. He was hydrated and clinically appears euvolemic.  61. Adult failure to thrive: Multifactorial related to very advanced age, frailty and multiple severe significant irreversible comorbidities. Patient is at high risk for decline. Palliative care team was consulted who met with patient and family multiple times. As per discussion with palliative team today, patient has opted to continue current medical care, transfer to SNF for rehabilitation. They do recommend palliative care consultation and follow-up at SNF.     "[place] Nurse wound consult note Reason for Consult: Skin tears sustained during fall at facility. Wound type:Traumatic Pressure Ulcer POA: No Measurement:Right shoulder: Grade 2 skin tear measuring 3.5cm x 3cm x 0.1cm (avulsion with skin rolled back to cover majority of wound.  10% of wound remains open with red, moist wound bed.)  Left knee:  Grade 2 skin tear measuring 3cm x 1.5cm x 0.2cm, see above.  20% of wound bed evident, red, moist wound bed with  serosanguinous exudate in a scant amount evident.  Left LE:  Grade 1 skin tear measuring 2cm x 0.1cm x 0.1cm with near complete approximation, small amount of red wound bed evident with no drainage. Wound bed:As described above Drainage (amount, consistency, odor) As described above Periwound: intact, areas of ecchymosis consistent with fall and others that are healing Dressing procedure/placement/frequency: I will provide Nursing with conservative care guidance via the Orders for placement of white petrolatum gauze over the injuries covered with dry gauze and topped with silicone foam.  Additionally, I have provided pressure redistribution heel boots for his use in bed and a pressure redistribution chair pad for his use when OOB in a chair to prevent pressure injuries."   Discharge Instructions  Discharge Instructions    Call MD for:  difficulty breathing, headache or visual disturbances    Complete by:  As directed    Call MD for:  extreme fatigue    Complete by:  As directed    Call MD for:  persistant dizziness or light-headedness    Complete by:  As directed    Call MD for:  persistant nausea and vomiting    Complete by:  As directed    Call MD for:  redness, tenderness, or signs of infection (pain, swelling, redness, odor or green/yellow discharge around incision site)    Complete by:  As directed    Call MD for:  severe uncontrolled pain    Complete by:  As directed    Call MD for:  temperature >100.4    Complete by:  As directed    Discharge instructions    Complete by:  As directed    DIET: As below as per speech therapy recommendations.  Diet recommendations: Dysphagia 1 (puree);Thin liquid Liquids provided via: Straw;Cup Medication Administration: Crushed with puree Supervision: Full supervision/cueing for compensatory strategies;Staff to assist with self feeding Compensations: Minimize environmental distractions;Slow rate;Small sips/bites;Follow solids with liquid;Other  (Comment) (no cold drinks) Postural Changes and/or Swallow  Maneuvers: Seated upright 90 degrees;Upright 30-60 min after meal   Increase activity slowly    Complete by:  As directed        Medication List    STOP taking these medications   dextromethorphan 30 MG/5ML liquid Commonly known as:  DELSYM   ibrutinib 140 MG capsul Commonly known as:  IMBRUVICA   levofloxacin 500 MG tablet Commonly known as:  LEVAQUIN   oxybutynin 5 MG tablet Commonly known as:  DITROPAN   PRESCRIPTION MEDICATION     TAKE these medications   albuterol (2.5 MG/3ML) 0.083% nebulizer solution Commonly known as:  PROVENTIL Take 3 mLs (2.5 mg total) by nebulization every 6 (six) hours as needed for wheezing or shortness of breath.   ARTIFICIAL TEARS OP Apply to eye as needed.   benzonatate 100 MG capsule Commonly known as:  TESSALON Take by mouth 3 (three) times daily as needed for cough.   docusate sodium 100 MG capsule Commonly known as:  COLACE Take 100 mg by mouth 2 (two) times daily.   DULCOLAX 10 MG suppository Generic drug:  bisacodyl Place 10 mg rectally daily as needed for moderate constipation.   famotidine 10 MG tablet Commonly known as:  PEPCID Take 1 tablet (10 mg total) by mouth daily.   feeding supplement (ENSURE ENLIVE) Liqd Take 237 mLs by mouth 3 (three) times daily between meals.   feeding supplement (PRO-STAT SUGAR FREE 64) Liqd Take 30 mLs by mouth 2 (two) times daily.   finasteride 5 MG tablet Commonly known as:  PROSCAR Take 5 mg by mouth daily with breakfast.   FLEET ENEMA RE Place rectally as needed.   GENTEAL MILD TO MODERATE OP Place 1 drop into both eyes 2 (two) times daily.   glucosamine-chondroitin 500-400 MG tablet Take 1 tablet by mouth every morning.   ICAPS AREDS 2 Caps Take 1 capsule by mouth 2 (two) times daily.   mirtazapine 15 MG disintegrating tablet Commonly known as:  REMERON SOL-TAB Take 0.5 tablets (7.5 mg total) by mouth at  bedtime.   MUCINEX D 60-600 MG 12 hr tablet Generic drug:  pseudoephedrine-guaifenesin Take 1 tablet by mouth 2 (two) times daily as needed for congestion (cough).   MYRBETRIQ 50 MG Tb24 tablet Generic drug:  mirabegron ER Take 50 mg by mouth daily.   pantoprazole 40 MG tablet Commonly known as:  PROTONIX Take 40 mg by mouth daily.   phenazopyridine 100 MG tablet Commonly known as:  PYRIDIUM Take 100 mg by mouth 3 (three) times daily as needed (bladder spasms).   polyethylene glycol packet Commonly known as:  MIRALAX / GLYCOLAX Take 17 g by mouth daily as needed for mild constipation or moderate constipation.   Vitamin D3 50000 units Caps Take 1 capsule by mouth 2 (two) times a week.      Follow-up Information    M.D. at SNF. Schedule an appointment as soon as possible for a visit.   Why:  To be seen in 5-7 days with repeat labs (CBC with differential & BMP).       Wenda Low, MD. Schedule an appointment as soon as possible for a visit.   Specialty:  Internal Medicine Why:  Upon discharge from SNF. Contact information: [address] [place] [address] [phone]        Heath Lark, MD. Schedule an appointment as soon as possible for a visit in 2 week(s).   Specialty:  Hematology and Oncology Why:  MDs office will arrange  for follow-up in the first week of December. SNF to coordinate. Contact information: [place] [id number] [phone]        Palliative care consultation. Schedule an appointment as soon as possible for a visit.   Why:  Recommend palliative care consultation and follow-up at SNF.         No Known Allergies  Consultations:  Urology  Palliative care team   Procedures/Studies: Dg Chest 1 View  Result Date: 10/18/2016 CLINICAL DATA:  Pain after trauma. EXAM: CHEST 1 VIEW COMPARISON:  August 01, 2016 FINDINGS: The heart size and mediastinal contours are within normal limits. Both lungs are  clear. The visualized skeletal structures are unremarkable. IMPRESSION: No acute interval change. Electronically Signed   By: Dorise Bullion III M.D   On: 10/18/2016 17:37   Dg Pelvis 1-2 Views  Result Date: 10/18/2016 CLINICAL DATA:  Pain after fall EXAM: PELVIS - 1-2 VIEW COMPARISON:  None. FINDINGS: There is no evidence of pelvic fracture or diastasis. No pelvic bone lesions are seen. IMPRESSION: Negative. Electronically Signed   By: Dorise Bullion III M.D   On: 10/18/2016 17:35   Ct Head Wo Contrast  Result Date: 10/18/2016 CLINICAL DATA:  Fall in bathroom today. Right supraorbital laceration. History of thrombocytopenia. EXAM: CT HEAD WITHOUT CONTRAST CT MAXILLOFACIAL WITHOUT CONTRAST CT CERVICAL SPINE WITHOUT CONTRAST TECHNIQUE: Multidetector CT imaging of the head, cervical spine, and maxillofacial structures were performed using the standard protocol without intravenous contrast. Multiplanar CT image reconstructions of the cervical spine and maxillofacial structures were also generated. COMPARISON:  PET-CT 02/26/2016. FINDINGS: CT HEAD FINDINGS Brain: There is no evidence of acute intracranial hemorrhage, mass lesion, brain edema or extra-axial fluid collection. There is stable generalized prominence of the ventricles and subarachnoid spaces consistent with moderate atrophy. There are relatively mild chronic small vessel ischemic changes in the periventricular white matter. There is no CT evidence of acute cortical infarction. Vascular: Intracranial vascular calcifications are present. Skull: Prominent soft tissue swelling in the right supraorbital scalp. No evidence of calvarial fracture. Sinuses/Orbits: Facial findings below. No evidence of orbital hematoma. The mastoid air cells and middle ears are clear. Other: None. CT MAXILLOFACIAL FINDINGS Osseous: No evidence of acute facial fracture. The temporomandibular joints are intact. Orbits: Right supraorbital scalp hematoma. No evidence of  orbital hematoma. The globes are intact. The optic nerves and extraocular muscles appear normal. Sinuses: Scattered mucosal thickening in the right maxillary, bilateral ethmoid and bilateral sphenoid sinuses. Possible left sphenoid sinus air-fluid level. Soft tissues: None additional. CT CERVICAL SPINE FINDINGS Alignment: Mild convex right scoliosis. The lateral alignment is normal. Skull base and vertebrae: No evidence of acute fracture or traumatic subluxation. There is multilevel spondylosis, primarily affecting the facet joints, worse on the left. Soft tissues and spinal canal: No prevertebral fluid or swelling. No visible canal hematoma. Disc levels: No evidence of high-grade spinal stenosis or large disc herniation. There is multilevel spondylosis with asymmetric left-sided facet hypertrophy. There is some resulting foraminal narrowing, worst on the left at C3-4. Upper chest: Stable without suspicious findings. Other: None. IMPRESSION: 1. Right supraorbital scalp hematoma. No evidence of calvarial fracture. 2. No acute intracranial findings. 3. No evidence of orbital hematoma, maxillofacial or cervical spine fracture. 4. Paranasal sinus mucosal thickening, likely inflammatory. 5. Cervical spondylosis as described. Electronically Signed   By: Richardean Sale M.D.   On: 10/18/2016 17:10   Ct Chest Wo Contrast  Result Date: 10/21/2016 CLINICAL DATA:  Chronic cough, shortness of breath.  History of bronchiectasis, lung nodule, lymphoma on chemotherapy. EXAM: CT CHEST WITHOUT CONTRAST TECHNIQUE: Multidetector CT imaging of the chest was performed following the standard protocol without IV contrast. COMPARISON:  CT abdomen and pelvis October 18, 2016 FINDINGS: Cardiovascular: Heart size is normal. Mild coronary artery calcifications. No pericardial effusions. Main pulmonary artery is 3.3 cm, mildly enlarged. Thoracic aorta is normal course and caliber with mild calcific atherosclerosis. Mildly vaso vasorum  associated with anemia. Mediastinum/Nodes: Multiple sub cm pretracheal, carinal, subcarinal and hilar lymph nodes, limited assessment on noncontrast CT. No mediastinal mass. Circumferential thickening of the thoracic esophagus. Lungs/Pleura: Worsening lower lobe consolidation. Scattered tree-in-bud infiltrates a. 3 mm LEFT upper lobe pulmonary nodule below size followup recommendation. RIGHT lung base ground-glass centrilobular nodules. Small pleural effusions are new. Small amount of debris layering in the trachea. No pneumothorax. Upper Abdomen: Splenomegaly best characterized on recent CT. Small amount of ascites in the upper abdomen is new. Musculoskeletal: Patient's cachectic.  T12 butterfly vertebral body. IMPRESSION: Worsening multifocal pneumonia with small pleural effusions. Aspirate layering in the trachea. New upper abdominal ascites. Thickened thoracic esophagus suggests esophagitis, which could be confirmed with endoscopy. Electronically Signed   By: Elon Alas M.D.   On: 10/21/2016 22:42   Ct Cervical Spine Wo Contrast  Result Date: 10/18/2016 CLINICAL DATA:  Fall in bathroom today. Right supraorbital laceration. History of thrombocytopenia. EXAM: CT HEAD WITHOUT CONTRAST CT MAXILLOFACIAL WITHOUT CONTRAST CT CERVICAL SPINE WITHOUT CONTRAST TECHNIQUE: Multidetector CT imaging of the head, cervical spine, and maxillofacial structures were performed using the standard protocol without intravenous contrast. Multiplanar CT image reconstructions of the cervical spine and maxillofacial structures were also generated. COMPARISON:  PET-CT 02/26/2016. FINDINGS: CT HEAD FINDINGS Brain: There is no evidence of acute intracranial hemorrhage, mass lesion, brain edema or extra-axial fluid collection. There is stable generalized prominence of the ventricles and subarachnoid spaces consistent with moderate atrophy. There are relatively mild chronic small vessel ischemic changes in the periventricular white  matter. There is no CT evidence of acute cortical infarction. Vascular: Intracranial vascular calcifications are present. Skull: Prominent soft tissue swelling in the right supraorbital scalp. No evidence of calvarial fracture. Sinuses/Orbits: Facial findings below. No evidence of orbital hematoma. The mastoid air cells and middle ears are clear. Other: None. CT MAXILLOFACIAL FINDINGS Osseous: No evidence of acute facial fracture. The temporomandibular joints are intact. Orbits: Right supraorbital scalp hematoma. No evidence of orbital hematoma. The globes are intact. The optic nerves and extraocular muscles appear normal. Sinuses: Scattered mucosal thickening in the right maxillary, bilateral ethmoid and bilateral sphenoid sinuses. Possible left sphenoid sinus air-fluid level. Soft tissues: None additional. CT CERVICAL SPINE FINDINGS Alignment: Mild convex right scoliosis. The lateral alignment is normal. Skull base and vertebrae: No evidence of acute fracture or traumatic subluxation. There is multilevel spondylosis, primarily affecting the facet joints, worse on the left. Soft tissues and spinal canal: No prevertebral fluid or swelling. No visible canal hematoma. Disc levels: No evidence of high-grade spinal stenosis or large disc herniation. There is multilevel spondylosis with asymmetric left-sided facet hypertrophy. There is some resulting foraminal narrowing, worst on the left at C3-4. Upper chest: Stable without suspicious findings. Other: None. IMPRESSION: 1. Right supraorbital scalp hematoma. No evidence of calvarial fracture. 2. No acute intracranial findings. 3. No evidence of orbital hematoma, maxillofacial or cervical spine fracture. 4. Paranasal sinus mucosal thickening, likely inflammatory. 5. Cervical spondylosis as described. Electronically Signed   By: Richardean Sale M.D.   On: 10/18/2016 17:10   US  Renal  Result Date: 10/18/2016 CLINICAL DATA:  Acute kidney injury, UTI EXAM: RENAL / URINARY  TRACT ULTRASOUND COMPLETE COMPARISON:  07/28/2016 FINDINGS: Right Kidney: Length: 14.1 cm. Increased cortical echogenicity. Marked hydronephrosis of the right kidney, grossly unchanged. Left Kidney: Length: 10.5 cm. Increased cortical echogenicity. Moderate severe left hydronephrosis, not seen on the prior study. Bladder: Foley catheter is present in the bladder with a thick wall. Hypoechoic structure posterior to the bladder could represent enlarged prostate gland. IMPRESSION: 1. Echogenic kidneys bilaterally. 2. Marked hydronephrosis of the right kidney grossly unchanged. 3. Moderate severe hydronephrosis of left kidney, new compared to prior study. 4. Thick-walled bladder collapsed around Foley catheter. Enlarged prostate. Electronically Signed   By: Donavan Foil M.D.   On: 10/18/2016 20:59   Dg Knee Complete 4 Views Left  Result Date: 10/18/2016 CLINICAL DATA:  Pain after fall EXAM: LEFT KNEE - COMPLETE 4+ VIEW COMPARISON:  None. FINDINGS: Degenerative changes most prominent in the medial compartment. No fracture or effusion. IMPRESSION: Degenerative changes. Electronically Signed   By: Dorise Bullion III M.D   On: 10/18/2016 17:34   Ct Renal Stone Study  Result Date: 10/18/2016 CLINICAL DATA:  History of hydronephrosis with worsening renal function and possible UTI EXAM: CT ABDOMEN AND PELVIS WITHOUT CONTRAST TECHNIQUE: Multidetector CT imaging of the abdomen and pelvis was performed following the standard protocol without IV contrast. COMPARISON:  Nuclear medicine scan 03/19/2016, CT 10/24/2014 FINDINGS: Lower chest: Hazy opacities within the bilateral lower lobes could reflect atelectasis or pneumonias. Mild reticular densities likely indicate underlying fibrosis. No pleural effusion. Mild distal esophageal wall thickening similar compared to prior. Small left infrahilar lymph node measuring 1 cm. Hepatobiliary: No focal hepatic abnormality is seen. The gallbladder is surgically absent. There is  intra hepatic biliary dilatation and dilated extrahepatic common bile duct similar compared to the previous CT scan. Pancreas: Unremarkable. No pancreatic ductal dilatation or surrounding inflammatory changes. Spleen: The spleen is slightly enlarged at 14 cm. Adrenals/Urinary Tract: Bilateral adrenal glands demonstrate slight nodular thickening but no focal mass. Kidneys are atrophic bilaterally. Markedly dilated right extrarenal pelvis with marked dilatation of the ureter diffusely moderate dilatation of left extra renal pelvis, increased compared to previous CT, however this was noted to be enlarged on nuclear medicine imaging. The left ureter is markedly dilated, similar compared to previous CT scan. There is a 3 mm stone within the mid left kidney. No stones are identified along the course of the left ureter. There is a Foley catheter in the bladder. The bladder wall is very thickened and there is mild soft tissue stranding surrounding the bladder. A small diverticulum is again present along the right aspect of the collapsed bladder. Stomach/Bowel: The stomach is nonenlarged. No dilated small bowel. There is colon diverticular disease without wall thickening. Vascular/Lymphatic: Multiple calcified phleboliths. Atherosclerosis. The previously noted abdominal pelvic adenopathy on the comparison CT scan has significantly decreased. There are clustered slightly enlarged lymph nodes in the left upper abdominal mesentery. Reproductive: The prostate gland is markedly enlarged. Other: There is no free air or free fluid. Fatty containing ventral hernia. 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 No new suspicious bone lesions. IMPRESSION: 1. Mild reticular density at the bilateral lung bases suggests chronic interstitial changes. There are hazy superimposed opacities which may reflect atelectasis or pneumonia. 2. Markedly dilated right extrarenal pelvis with markedly dilated right ureter similar compared to  prior imaging. Moderate dilatation of left extra renal pelvis, slightly increased; there is a very dilated left  ureter which appears similar compared to the prior CT. There is a 3 mm stone in the left kidney. There is no stone identified along the course of the dilated distal left ureter. 3. Foley catheter in the bladder. There is marked wall thickening of the bladder, this could relate to a cystitis or neoplasm. Right posterior bladder diverticulum. 4. Post cholecystectomy changes with stable intra and extrahepatic biliary dilatation. 5. Decreased adenopathy within the abdomen pelvis compared to the previous CT scan. Splenomegaly. 6. Markedly enlarged prostate gland. Electronically Signed   By: Donavan Foil M.D.   On: 10/18/2016 23:14   Ct Maxillofacial Wo Contrast  Result Date: 10/18/2016 CLINICAL DATA:  Fall in bathroom today. Right supraorbital laceration. History of thrombocytopenia. EXAM: CT HEAD WITHOUT CONTRAST CT MAXILLOFACIAL WITHOUT CONTRAST CT CERVICAL SPINE WITHOUT CONTRAST TECHNIQUE: Multidetector CT imaging of the head, cervical spine, and maxillofacial structures were performed using the standard protocol without intravenous contrast. Multiplanar CT image reconstructions of the cervical spine and maxillofacial structures were also generated. COMPARISON:  PET-CT 02/26/2016. FINDINGS: CT HEAD FINDINGS Brain: There is no evidence of acute intracranial hemorrhage, mass lesion, brain edema or extra-axial fluid collection. There is stable generalized prominence of the ventricles and subarachnoid spaces consistent with moderate atrophy. There are relatively mild chronic small vessel ischemic changes in the periventricular white matter. There is no CT evidence of acute cortical infarction. Vascular: Intracranial vascular calcifications are present. Skull: Prominent soft tissue swelling in the right supraorbital scalp. No evidence of calvarial fracture. Sinuses/Orbits: Facial findings below. No evidence  of orbital hematoma. The mastoid air cells and middle ears are clear. Other: None. CT MAXILLOFACIAL FINDINGS Osseous: No evidence of acute facial fracture. The temporomandibular joints are intact. Orbits: Right supraorbital scalp hematoma. No evidence of orbital hematoma. The globes are intact. The optic nerves and extraocular muscles appear normal. Sinuses: Scattered mucosal thickening in the right maxillary, bilateral ethmoid and bilateral sphenoid sinuses. Possible left sphenoid sinus air-fluid level. Soft tissues: None additional. CT CERVICAL SPINE FINDINGS Alignment: Mild convex right scoliosis. The lateral alignment is normal. Skull base and vertebrae: No evidence of acute fracture or traumatic subluxation. There is multilevel spondylosis, primarily affecting the facet joints, worse on the left. Soft tissues and spinal canal: No prevertebral fluid or swelling. No visible canal hematoma. Disc levels: No evidence of high-grade spinal stenosis or large disc herniation. There is multilevel spondylosis with asymmetric left-sided facet hypertrophy. There is some resulting foraminal narrowing, worst on the left at C3-4. Upper chest: Stable without suspicious findings. Other: None. IMPRESSION: 1. Right supraorbital scalp hematoma. No evidence of calvarial fracture. 2. No acute intracranial findings. 3. No evidence of orbital hematoma, maxillofacial or cervical spine fracture. 4. Paranasal sinus mucosal thickening, likely inflammatory. 5. Cervical spondylosis as described. Electronically Signed   By: Richardean Sale M.D.   On: 10/18/2016 17:10      Subjective: Feels much better. States that he slept very well "like a baby" last night when for the first time after several nights. Denies any other complaints. Denies pain, dyspnea, cough or chest pain. As per RN, no acute issues reported.  Discharge Exam:  Vitals:   10/23/16 1128 10/23/16 1617 10/23/16 2205 10/24/16 0657  BP: (!) 94/47 (!) 111/54 (!) 102/41  97/60  Pulse: 83 83 83 82  Resp: (!) '24 17 19 14  ' Temp: 97.8 F (36.6 C)  98.9 F (37.2 C) 97.6 F (36.4 C)  TempSrc: Oral  Oral Oral  SpO2: 99% 96% 98% 96%  Weight:    62.3 kg (137 lb 4.8 oz)  Height:        General: Alert, Awake and Oriented to Time, Place and Person. Lying comfortably propped up in bed without distress. Eyes: PERRL, Conjunctiva normal. Extensive bruising around right eye, right side of forehead. ENT: Oral Mucosa clear moist. Neck: no JVD, no Abnormal Mass Or lumps Cardiovascular: S1 and S2 Present, no Murmur, telemetry: Sinus rhythm with BBB morphology. Respiratory: Diminished breath sounds in the bases with occasional crackles but otherwise clear to auscultation. No increased work of breathing. Abdomen: Bowel Sound present, Soft and no tenderness. Foley catheter +. Skin: Extensive bruising of the face as indicated above and upper trunk. Wound over left knee with dressing. Extremities: Trace bilateral leg edema. Neurologic: Grossly no focal neuro deficit. Bilaterally Equal motor strength     The results of significant diagnostics from this hospitalization (including imaging, microbiology, ancillary and laboratory) are listed below for reference.     Microbiology: Recent Results (from the past 240 hour(s))  Urine culture     Status: Abnormal   Collection Time: 10/18/16  6:00 PM  Result Value Ref Range Status   Specimen Description URINE, CATHETERIZED  Final   Special Requests NONE  Final   Culture MULTIPLE SPECIES PRESENT, SUGGEST RECOLLECTION (A)  Final   Report Status 10/19/2016 FINAL  Final  MRSA PCR Screening     Status: Abnormal   Collection Time: 10/18/16  9:45 PM  Result Value Ref Range Status   MRSA by PCR POSITIVE (A) NEGATIVE Final    Comment:        The GeneXpert MRSA Assay (FDA approved for NASAL specimens only), is one component of a comprehensive MRSA colonization surveillance program. It is not intended to diagnose MRSA infection  nor to guide or monitor treatment for MRSA infections. RESULT CALLED TO, READ BACK BY AND VERIFIED WITH: S BARNHILL,RN '@[id number]'  10/19/16 MKELLY,MLT   Culture, Urine     Status: Abnormal   Collection Time: 10/19/16  5:07 PM  Result Value Ref Range Status   Specimen Description URINE, RANDOM  Final   Special Requests NONE  Final   Culture MULTIPLE SPECIES PRESENT, SUGGEST RECOLLECTION (A)  Final   Report Status 10/20/2016 FINAL  Final  Culture, blood (routine x 2)     Status: None (Preliminary result)   Collection Time: 10/21/16 10:38 AM  Result Value Ref Range Status   Specimen Description BLOOD RIGHT ARM  Final   Special Requests IN PEDIATRIC BOTTLE 1CC  Final   Culture NO GROWTH 2 DAYS  Final   Report Status PENDING  Incomplete  Culture, blood (routine x 2)     Status: None (Preliminary result)   Collection Time: 10/21/16 10:48 AM  Result Value Ref Range Status   Specimen Description BLOOD RIGHT HAND  Final   Special Requests IN PEDIATRIC BOTTLE 2CC  Final   Culture NO GROWTH 2 DAYS  Final   Report Status PENDING  Incomplete     Labs: BNP (last 3 results)  Recent Labs  10/18/16 1634  BNP 68.6   Basic Metabolic Panel:  Recent Labs Lab 10/20/16 1015 10/21/16 0439 10/22/16 0343 10/23/16 0402 10/24/16 0347  NA 140 141 142 142 141  K 3.6 3.4* 3.8 3.9 4.0  CL 111 112* 115* 113* 112*  CO2 21* '22 23 24 25  ' GLUCOSE 129* 88 107* 87 89  BUN 40* 42* 40* 38* 39*  CREATININE 1.94* 1.89* 1.79* 1.65* 1.56*  CALCIUM 8.3* 7.8*  8.0* 8.2* 8.3*  PHOS 2.7 2.4* 2.4* 2.4* 2.4*   Liver Function Tests:  Recent Labs Lab 10/18/16 1634 10/20/16 1015 10/21/16 0439 10/22/16 0343 10/23/16 0402 10/24/16 0347  AST 22  --   --   --   --   --   ALT 16*  --   --   --   --   --   ALKPHOS 137*  --   --   --   --   --   BILITOT 1.1  --   --   --   --   --   PROT 4.3*  --   --   --   --   --   ALBUMIN 2.4* 1.9* 1.7* 1.7* 1.9* 1.9*   No results for input(s): LIPASE, AMYLASE in the last  168 hours. No results for input(s): AMMONIA in the last 168 hours. CBC:  Recent Labs Lab 10/20/16 1015 10/21/16 0439 10/21/16 1044 10/22/16 0343 10/23/16 0402 10/24/16 0347  WBC 2.9* 3.2* 3.1* 2.7* 3.4* 3.3*  NEUTROABS 1.4* 1.5*  --  1.2* 1.7 1.8  HGB 7.8* 7.3* 7.9* 7.5* 7.7* 7.8*  HCT 25.4* 23.4* 25.9* 24.5* 24.8* 25.4*  MCV 84.7 85.7 86.3 84.8 85.5 85.2  PLT 69* 54* 45* 66* 52* 45*   C Urinalysis    Component Value Date/Time   COLORURINE YELLOW 10/18/2016 1800   APPEARANCEUR TURBID (A) 10/18/2016 1800   LABSPEC 1.014 10/18/2016 1800   PHURINE 6.0 10/18/2016 1800   GLUCOSEU NEGATIVE 10/18/2016 1800   HGBUR LARGE (A) 10/18/2016 1800   BILIRUBINUR NEGATIVE 10/18/2016 1800   KETONESUR NEGATIVE 10/18/2016 1800   PROTEINUR 100 (A) 10/18/2016 1800   NITRITE NEGATIVE 10/18/2016 1800   LEUKOCYTESUR LARGE (A) 10/18/2016 1800    Discussed in detail with patient's step son Mr. Sidonie Dickens, updated care and answered questions including planned DC to SNF. He was agreeable.  Time coordinating discharge: Over 30 minutes  SIGNED:  Vernell Leep, MD, FACP, [place]. [facility] Pager [phone]  If 7PM-7AM, please contact night-coverage [email or web address] Password [email or web address] 10/24/2016, 1:23 PM

## 2016-10-24 NOTE — Telephone Encounter (Signed)
Lm for stacy with [facility] to make her aware of below message. I have also LM for pt to make pt aware of this as well. Nothing further needed.

## 2016-10-24 NOTE — Progress Notes (Signed)
Report given to receiving nurse at [facility]. All questions answered, Awaiting transport.  Ferdinand Lango, RN

## 2016-10-24 NOTE — Clinical Social Work Note (Signed)
Patient medically stable for discharge back to [facility] today. Discharge clinicals transmitted to facility and patient will be transported by ambulance. Son, Rush Landmark contacted and advised of discharge today back to SNF and requested he contact admissions director at [facility] regarding re-admissions paperwork completion.  Sherilynn Dieu Givens, MSW, LCSW Licensed Clinical Social Worker [place] [phone]

## 2016-10-24 NOTE — Progress Notes (Signed)
Daily Progress Note   Patient Name: Daniel [name]       Date: 10/24/2016 DOB: 02-Feb-1919  Age: 82 y.o. MRN#: [id number] Attending Physician: Modena Jansky, MD Primary Care Physician: Wenda Low, MD Admit Date: 10/18/2016  Reason for Consultation/Follow-up: Establishing goals of care and Non pain symptom management  Subjective:  -meet patient at bedside,  he is alert and oriented and engaged.  He tells me he "had the best sleep" last night since he has been here.  Will continue Remeron  - today's conversation is upbeat, he tells me he will be going to a "rest home" and that he is hopeful for " good days"   -  Recommend palliative services to follow on discharge for continuity and assistance for patient to determine desire for life prolonging measures along his disease/life continuum   Length of Stay: 5  Current Medications: Scheduled Meds:  . dextromethorphan-guaiFENesin  1 tablet Oral BID  . docusate sodium  100 mg Oral BID  . feeding supplement (ENSURE ENLIVE)  237 mL Oral TID BM  . feeding supplement (PRO-STAT SUGAR FREE 64)  30 mL Oral BID  . finasteride  5 mg Oral Q breakfast  . levofloxacin (LEVAQUIN) IV  500 mg Intravenous Q48H  . mirabegron ER  50 mg Oral Daily  . mirtazapine  7.5 mg Oral QHS  . multivitamin  1 tablet Oral BID  . pantoprazole  40 mg Oral Daily  . polyvinyl alcohol  1 drop Both Eyes BID  . sodium chloride flush  3 mL Intravenous Q12H  . Vitamin D (Ergocalciferol)  50,000 Units Oral Once per day on Mon Thu    Continuous Infusions:   PRN Meds: acetaminophen **OR** acetaminophen, albuterol, artificial tears, bisacodyl, menthol-cetylpyridinium, ondansetron **OR** ondansetron (ZOFRAN) IV, oxybutynin, polyethylene glycol  Physical Exam  Constitutional: He appears cachectic. He has a sickly  appearance.  HENT:  Mouth/Throat: Mucous membranes are dry.  Cardiovascular: Normal rate, regular rhythm and normal heart sounds.   Pulmonary/Chest: He has decreased breath sounds in the right lower field and the left lower field.  Musculoskeletal:  Generalized weakness and muscle atrophy  Neurological: He is alert.  Skin: Skin is warm and dry.             Vital Signs: BP 97/60   Pulse 82   Temp 97.6 F (36.4 C) (Oral)   Resp 14   Ht 5\' 5"  (1.651 m)   Wt 62.3 kg (137 lb 4.8 oz)   SpO2 96%   BMI 22.85 kg/m  SpO2: SpO2: 96 % O2 Device: O2 Device: Not Delivered O2 Flow Rate:    Intake/output summary:   Intake/Output Summary (Last 24 hours) at 10/24/16 0921 Last data filed at 10/24/16 0700  Gross per 24 hour  Intake              240 ml  Output             1225 ml  Net             -985 ml   LBM: Last BM Date: 10/23/16 Baseline Weight: Weight: 54 kg (119 lb) Most recent weight: Weight: 62.3 kg (137 lb 4.8 oz)  Palliative Assessment/Data:  PPS 50%   Flowsheet Rows   Flowsheet Row Most Recent Value  Intake Tab  Referral Department  Hospitalist  Unit at Time of Referral  Cardiac/Telemetry Unit  Palliative Care Primary Diagnosis  Cancer  Date Notified  10/21/16  Palliative Care Type  New Palliative care  Reason for referral  Clarify Goals of Care  Date of Admission  10/18/16  # of days IP prior to Palliative referral  3  Clinical Assessment  Psychosocial & Spiritual Assessment  Palliative Care Outcomes      Patient Active Problem List   Diagnosis Date Noted  . Aspiration pneumonia ([place]) 10/22/2016  . Sepsis ([place]) 10/22/2016  . Muscle weakness (generalized)   . Palliative care encounter   . Goals of care, counseling/discussion   . Syncope 10/18/2016  . Fall 10/18/2016  . BPH (benign prostatic hyperplasia) 10/18/2016  . GERD (gastroesophageal reflux disease) 10/18/2016  . AKI (acute kidney injury) ([place]) 07/28/2016  . Hypotension 07/28/2016  .  Chronic diastolic CHF (congestive heart failure) ([place]) 07/28/2016  . Facial laceration   . UTI (lower urinary tract infection)   . UTI (urinary tract infection) 06/15/2016  . Acute renal failure ([place]) 06/14/2016  . Acute renal failure (ARF) ([place]) 06/14/2016  . Bilateral leg edema 06/07/2016  . Pancytopenia, acquired ([place]) 06/06/2016  . B12 deficiency 06/06/2016  . Acute respiratory failure ([place]) 03/19/2016  . Protein-calorie malnutrition, severe 03/15/2016  . Hypoxia 03/14/2016  . Lesion of left lung 03/07/2016  . Protein-calorie malnutrition, moderate ([place]) 03/07/2016  . Thrombocytopenia ([place]) 06/01/2015  . Hernia, inguinal, right 06/01/2015  . Essential hypertension 06/01/2015  . Acute renal failure superimposed on stage 3 chronic kidney disease ([place]) 01/10/2015  . 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 Preventive measure 11/14/2014  . Hydronephrosis, right 10/27/2014  . Cellulitis of arm, right 02/11/2014  . Bronchiectasis without acute exacerbation ([place]) 06/09/2013  . Hydroureteronephrosis 05/12/2012  . 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 CHRONIC AIRWAY OBSTRUCTION NEC 09/11/2009    Palliative Care Assessment & Plan   HPI:  82 y.o. male  with past medical history of NHL, small cell B Lymphoma, right sided hydronephrosis with indwelling cath, BPH, GERD, CKD-III, dCHF, and bronchiectasis with chronic cough who was admitted on 10/18/2016 after falling at his SNF.  Etiology of fall is unclear, though possibly due to vasovagal syncope, weakness, and/or sepsis due to UTI. He is presently being treated for sepsis and aspiration PNA with IV abx.  Assessment: -overall failure to thrive 2/2 to advanced age and multiple co-morbidities  Recommendations/Plan:  Continue  Remeron 7.5mg  qHS for sleep and appetite stimulation   Patient verbalizes his wish to return to [facility] and is open to Palliative to follow him there  Goals of Care and  Additional Recommendations:  Limitations on Scope of Treatment: Full Scope Treatment  Code Status:  DNR  Prognosis:   Unable to determine  Discharge Planning:  [place] for rehab with Palliative care service follow-up  Care plan was discussed with Patient , Dr Exie Parody  Thank you for allowing the Palliative Medicine Team to assist in the care of this patient.   Time In: 0815 Time Out: 0840 Total Time 25 minutes Prolonged Time Billed  no       Greater than 50%  of this time was spent counseling and coordinating care related to the above assessment and plan.  Wadie Lessen NP  Palliative Medicine Team Team Phone # [phone] Pager  [phone]   

## 2016-10-24 NOTE — Discharge Instructions (Signed)
Aspiration Pneumonia Aspiration pneumonia is an infection in your lungs. It occurs when food, liquid, or stomach contents (vomit) are inhaled (aspirated) into your lungs. When these things get into your lungs, swelling (inflammation) and infection can occur. This can make it difficult for you to breathe. Aspiration pneumonia is a serious condition and can be life threatening. What increases the risk? Aspiration pneumonia is more likely to occur when a person's cough (gag) reflex or ability to swallow has been decreased. Some things that can do this include:  Having a brain injury or disease, such as stroke, seizures, Parkinson's disease, dementia, or amyotrophic lateral sclerosis (ALS).  Being given general anesthetic for procedures.  Being in a coma (unconscious).  Having a narrowing of the tube that carries food to the stomach (esophagus).  Drinking too much alcohol. If a person passes out and vomits, vomit can be swallowed into the lungs.  Taking certain medicines, such as tranquilizers or sedatives. What are the signs or symptoms?  Coughing after swallowing food or liquids.  Breathing problems, such as wheezing or shortness of breath.  Bluish skin. This can be caused by lack of oxygen.  Coughing up food or mucus. The mucus might contain blood, greenish material, or yellowish-white fluid (pus).  Fever.  Chest pain.  Being more tired than usual (fatigue).  Sweating more than usual.  Bad breath. How is this diagnosed? A physical exam will be done. During the exam, the health care provider will listen to your lungs with a stethoscope to check for:  Crackling sounds in the lungs.  Decreased breath sounds.  A rapid heartbeat. Various tests may be ordered. These may include:  Chest X-ray.  CT scan.  Swallowing study. This test looks at how food is swallowed and whether it goes into your breathing tube (trachea) or food pipe (esophagus).  Sputum culture. Saliva and  mucus (sputum) are collected from the lungs or the tubes that carry air to the lungs (bronchi). The sputum is then tested for bacteria.  Bronchoscopy. This test uses a flexible tube (bronchoscope) to see inside the lungs. How is this treated? Treatment will usually include antibiotic medicines. Other medicines may also be used to reduce fever or pain. You may need to be treated in the hospital. In the hospital, your breathing will be carefully monitored. Depending on how well you are breathing, you may need to be given oxygen, or you may need breathing support from a breathing machine (ventilator). For people who fail a swallowing study, a feeding tube might be placed in the stomach, or they may be asked to avoid certain food textures or liquids when they eat. Follow these instructions at home:  Carefully follow any special eating instructions you were given, such as avoiding certain food textures or thickening liquids. This reduces the risk of developing aspiration pneumonia again.  Only take over-the-counter or prescription medicines as directed by your health care provider. Follow the directions carefully.  If you were prescribed antibiotics, take them as directed. Finish them even if you start to feel better.  Rest as instructed by your health care provider.  Keep all follow-up appointments with your health care provider. Contact a health care provider if:  You develop worsening shortness of breath, wheezing, or difficulty breathing.  You develop a fever.  You have chest pain. This information is not intended to replace advice given to you by your health care provider. Make sure you discuss any questions you have with your health care provider. Document Released:  09/22/2009 Document Revised: 05/08/2016 Document Reviewed: 05/13/2013 Elsevier Interactive Patient Education  2017 [employer]   Urinary Tract Infection, Adult Introduction A urinary tract infection (UTI) is an infection  of any part of the urinary tract. The urinary tract includes the:  Kidneys.  Ureters.  Bladder.  Urethra. These organs make, store, and get rid of pee (urine) in the body. Follow these instructions at home:  Take over-the-counter and prescription medicines only as told by your doctor.  If you were prescribed an antibiotic medicine, take it as told by your doctor. Do not stop taking the antibiotic even if you start to feel better.  Avoid the following drinks:  Alcohol.  Caffeine.  Tea.  Carbonated drinks.  Drink enough fluid to keep your pee clear or pale yellow.  Keep all follow-up visits as told by your doctor. This is important.  Make sure to:  Empty your bladder often and completely. Do not to hold pee for long periods of time.  Empty your bladder before and after sex.  Wipe from front to back after a bowel movement if you are male. Use each tissue one time when you wipe. Contact a doctor if:  You have back pain.  You have a fever.  You feel sick to your stomach (nauseous).  You throw up (vomit).  Your symptoms do not get better after 3 days.  Your symptoms go away and then come back. Get help right away if:  You have very bad back pain.  You have very bad lower belly (abdominal) pain.  You are throwing up and cannot keep down any medicines or water. This information is not intended to replace advice given to you by your health care provider. Make sure you discuss any questions you have with your health care provider. Document Released: 05/13/2008 Document Revised: 05/02/2016 Document Reviewed: 10/16/2015  2017 Elsevier

## 2016-10-24 NOTE — Progress Notes (Signed)
Physical Therapy Treatment Patient Details Name: Daniel [name] MRN: [id number] DOB: [birth year] Today's Date: 10/24/2016    History of Present Illness Patient is a 82 yo male admitted 10/18/16 following a fall with lacerations on Rt forehead and Lt knee.  Head CT negative per report.  Patient noted to have UTI.     PMH:  indwelling Foley catheter, dementia, anemia, HOH, CHF, HTN    PT Comments    Patient progressing well towards PT goal. Motivated to walk with PT. Improved ambulation distance with Min A for balance/safety. Pt reports discomfort in left knee. Eager to get out of hospital. Continues to have soft BP that drops post activity. BP 106/67 supine, BP sitting post ambulation 94/56 and dizziness reported. Will follow.   Follow Up Recommendations  SNF;Supervision/Assistance - 24 hour     Equipment Recommendations  None recommended by PT    Recommendations for Other Services       Precautions / Restrictions Precautions Precautions: Fall Precaution Comments: watch BP Restrictions Weight Bearing Restrictions: No    Mobility  Bed Mobility Overal bed mobility: Needs Assistance Bed Mobility: Supine to Sit     Supine to sit: Min guard;HOB elevated     General bed mobility comments: Able to get to EOB without assist but increased time and effortful. + dizziness.  Transfers Overall transfer level: Needs assistance Equipment used: Rolling walker (2 wheeled) Transfers: Sit to/from Stand Sit to Stand: Min assist         General transfer comment: Assist to boost from EOB with cues for hand placement. Stood from EOB x1,from chair x1. transferred to chair post ambulation bout.  Ambulation/Gait Ambulation/Gait assistance: Min guard Ambulation Distance (Feet): 120 Feet Assistive device: Rolling walker (2 wheeled) Gait Pattern/deviations: Step-through pattern;Decreased stride length;Trunk flexed;Shuffle Gait velocity: decreased   General Gait Details: Slow, shuffling  like gait with increased knee flexion bilaterally. Dizziness reported throughout ambulation.   Stairs            Wheelchair Mobility    Modified Rankin (Stroke Patients Only)       Balance Overall balance assessment: History of Falls;Needs assistance Sitting-balance support: Feet supported;No upper extremity supported Sitting balance-Leahy Scale: Good     Standing balance support: During functional activity Standing balance-Leahy Scale: Poor Standing balance comment: Reliant on BUEs for support in standing.                     Cognition Arousal/Alertness: Awake/alert Behavior During Therapy: WFL for tasks assessed/performed Overall Cognitive Status: No family/caregiver present to determine baseline cognitive functioning   Orientation Level: Disoriented to;Situation                  Exercises      General Comments        Pertinent Vitals/Pain Pain Assessment: Faces Faces Pain Scale: No hurt    Home Living                      Prior Function            PT Goals (current goals can now be found in the care plan section) Progress towards PT goals: Progressing toward goals    Frequency    Min 2X/week      PT Plan Current plan remains appropriate    Co-evaluation             End of Session Equipment Utilized During Treatment: Gait belt Activity Tolerance: Patient tolerated treatment well  Patient left: in chair;with call bell/phone within reach;with chair alarm set     Time: [phone] PT Time Calculation (min) (ACUTE ONLY): 24 min  Charges:  $Gait Training: 23-37 mins                    G Codes:      [place] 10/24/2016, 10:33 AM Wray Kearns, PT, DPT [phone]

## 2016-10-24 NOTE — Progress Notes (Signed)
Speech Language Pathology Treatment: Dysphagia  Patient Details Name: Daniel [name] MRN: [id number] DOB: 06-14-1919 Today's Date: 10/24/2016 Time: [id number] SLP Time Calculation (min) (ACUTE ONLY): 17 min  Assessment / Plan / Recommendation Clinical Impression   Delayed coughing followed trials of pureed solids and thin liquids. Pt continues to remain at risk for aspiration given PMH of chronic aspiration, with risk largely coming from esophageal issues. SLP provided education for dietary modifications and swallowing strategies and pt was in agreement to continue with current recommendations of Dys 1 and thin liquids. No further acute ST is necessary at this time. Will s/o.   HPI HPI: Daniel Poehlman Smithis a 82 y.o.malewith medical history significant of indwelling cath, NHL, small cell B-lymphoma, right-sided hydronephrosis, poor hearing wearing hearing aids, BPH, GERD, CKD-III, dCHF, mild bronchiectasis with chronic cough, who presents with fall.   Per report, pt fell in SNF. He reports to me and also to RN in ED that his fall happed after had bowel movement and slipped in bathroom.  The patient does complain of trouble swallowing.  He reported that about 3 years ago he had botex injections for a "small esophagus" and that he recently saw the GI doctor again but they are not able to repeat the botox injections.  He reported that his current symptoms are similar to just prior to his botox injections 3 years ago.  Most recent chest xray is negative for acute findings, however, the patient is dehydrated with a bolus of fluid just started.  Head CT is negative for acute findings.  The patient has had no previously documented swallowing evaluation at [facility].        SLP Plan  Continue with current plan of care     Recommendations  Diet recommendations: Thin liquid;Dysphagia 1 (puree) Liquids provided via: Straw;Cup Medication Administration: Crushed with puree Supervision: Full supervision/cueing for  compensatory strategies;Patient able to self feed Compensations: Slow rate;Small sips/bites;Minimize environmental distractions;Follow solids with liquid;Other (Comment) (no cold drinks) Postural Changes and/or Swallow Maneuvers: Seated upright 90 degrees;Upright 30-60 min after meal                Oral Care Recommendations: Oral care BID Follow up Recommendations: Skilled Nursing facility Plan: Continue with current plan of care       [place], Student SLP  Shela Leff 10/24/2016, 4:11 PM

## 2016-10-24 NOTE — Progress Notes (Signed)
Nutrition Follow-up  DOCUMENTATION CODES:   Severe malnutrition in context of chronic illness  INTERVENTION:    Continue Ensure Enlive PO TID, each supplement provides 350 kcal and 20 grams of protein  Continue Pro-stat 30 ml PO BID, each supplement provides 100 kcal and 15 gm protein.  NUTRITION DIAGNOSIS:   Inadequate oral intake related to poor appetite, acute illness, dysphagia (acute illness) as evidenced by percent weight loss.  Ongoing  GOAL:   Patient will meet greater than or equal to 90% of their needs  Progressing  MONITOR:   PO intake, Supplement acceptance, Labs, I & O's  ASSESSMENT:   82 y/o male PMHx NHL, indwelling Cath, Small cell B-Lymphoma, HOH, BPH, GERD, CKD3, CHF, Achlasia. Presented after falling at SNF after hard BM. Worked up for UTI and admitted for observation.    Palliative Care Team is now following patient. Diet has been downgraded to dysphagia 1 (pureed) with thin liquids. RN thinks intake has declined since diet downgrade. However, patient is consuming the Ensure and Pro-stat supplements when provided to him. Labs and medications reviewed.  Diet Order:  DIET - DYS 1 Room service appropriate? Yes; Fluid consistency: Thin  Skin:  Wound (see comment) (non-pressure wound to back)  Last BM:  11/15  Height:   Ht Readings from Last 1 Encounters:  10/18/16 5\' 5"  (1.651 m)    Weight:   Wt Readings from Last 1 Encounters:  10/24/16 137 lb 4.8 oz (62.3 kg)    Ideal Body Weight:  61.82 kg  BMI:  Body mass index is 22.85 kg/m.  Estimated Nutritional Needs:   Kcal:  1500-1700 (28-32 kcal/kg bw)  Protein:  65-75 g Pro (1.2-1.4 g/kg bw)  Fluid:  1.5-1.7 L fluid  EDUCATION NEEDS:   No education needs identified at this time  Molli Barrows, [place], [place], [place] Pager [phone] After Hours Pager [phone]

## 2016-10-25 ENCOUNTER — Inpatient Hospital Stay: Admission: RE | Admit: 2016-10-25 | Payer: Medicare Other | Source: Ambulatory Visit

## 2016-10-26 LAB — CULTURE, BLOOD (ROUTINE X 2)
CULTURE: NO GROWTH
Culture: NO GROWTH

## 2016-10-28 ENCOUNTER — Telehealth: Payer: Self-pay | Admitting: *Deleted

## 2016-11-08 ENCOUNTER — Ambulatory Visit: Payer: Medicare Other | Admitting: Pulmonary Disease

## 2016-11-08 NOTE — Telephone Encounter (Signed)
LM for [place] at [facility] to reschedule missed lab and NG appt to week of 12/4. Preferably Thursday or Friday. 30 minute appt

## 2016-11-08 DEATH — deceased

## 2017-10-20 IMAGING — CT CT RENAL STONE PROTOCOL
2 of 4 series · 14 of 46 positions shown, 16 images · non-contrast
Comparison: Nuclear medicine scan 03/19/2016, CT 10/24/2014

CLINICAL DATA: History of hydronephrosis with worsening renal
function and possible UTI

EXAM:
CT ABDOMEN AND PELVIS WITHOUT CONTRAST
TECHNIQUE: Multidetector CT imaging of the abdomen and pelvis was performed
following the standard protocol without IV contrast.

[Series 2: stone study 5.0 i30f 1 · axial · 0.74mm/px · z∈[-632,-222]mm · 11 of 100 slices shown, 13 images]
[im 9/100  soft-tissue]
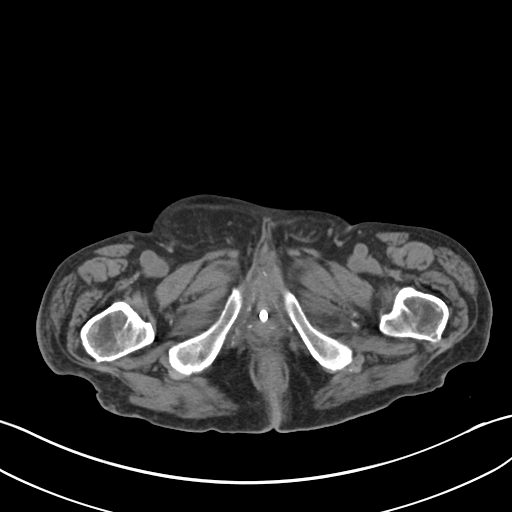
[im 9/100  bone]
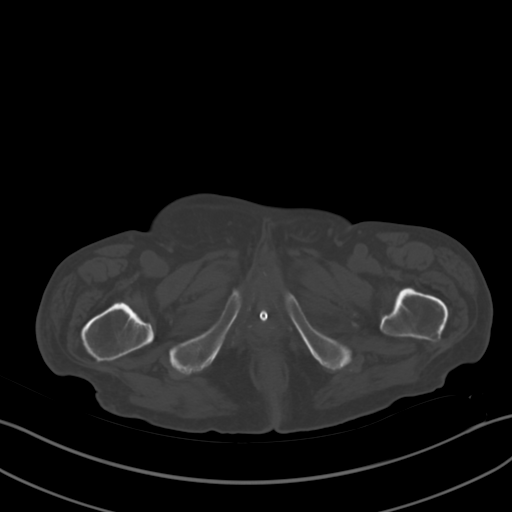
[im 17/100  soft-tissue]
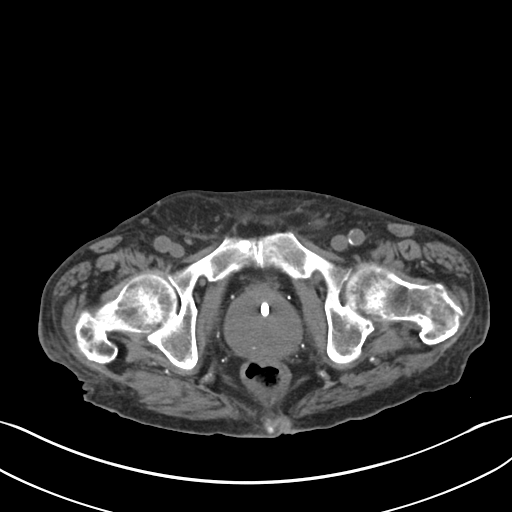
[im 25/100  soft-tissue]
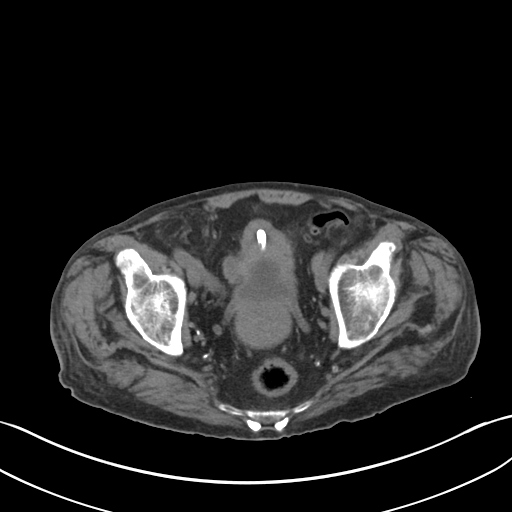
[im 34/100  soft-tissue]
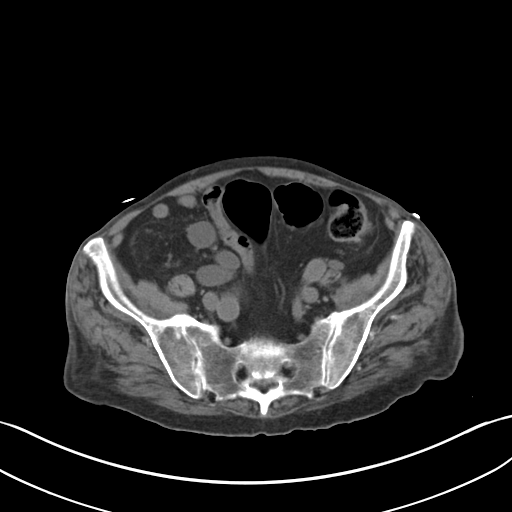
[im 42/100  soft-tissue]
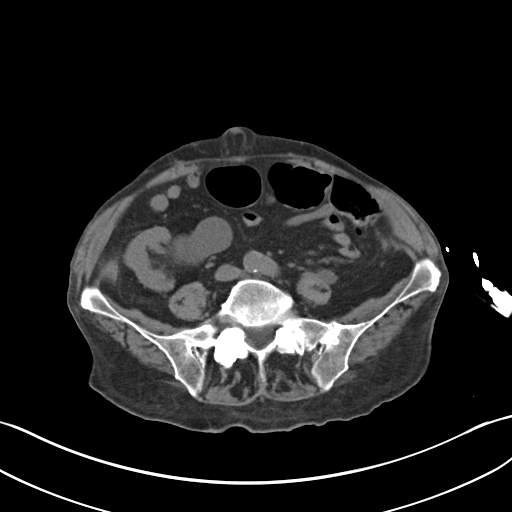
[im 50/100  soft-tissue]
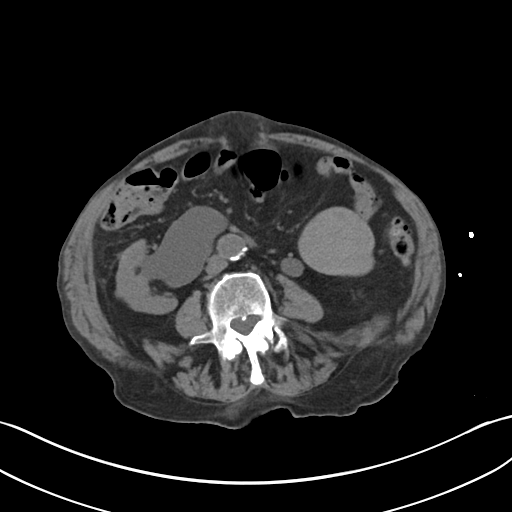
[im 58/100  soft-tissue]
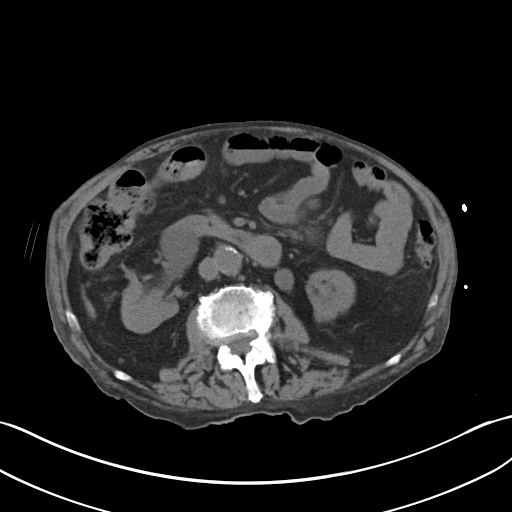
[im 67/100  soft-tissue]
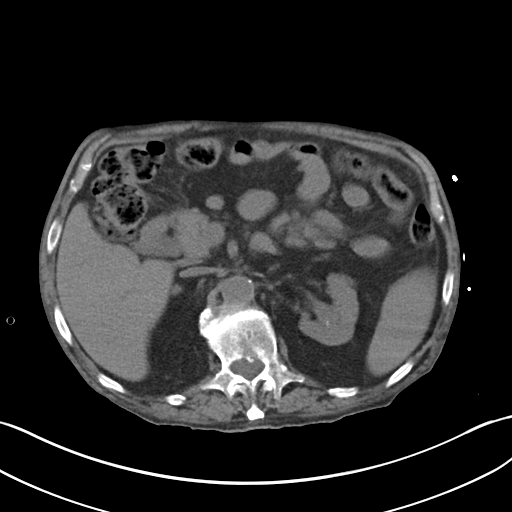
[im 75/100  soft-tissue]
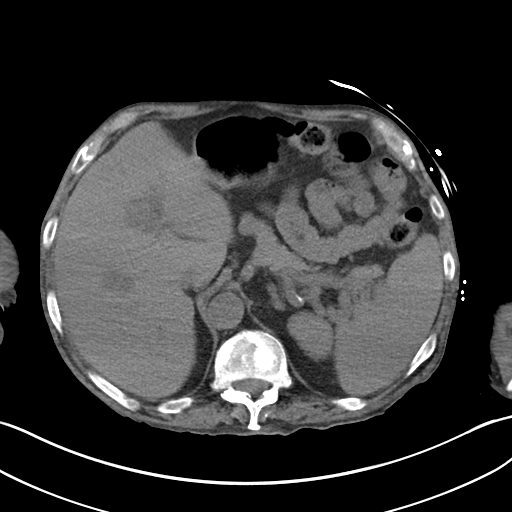
[im 75/100  bone]
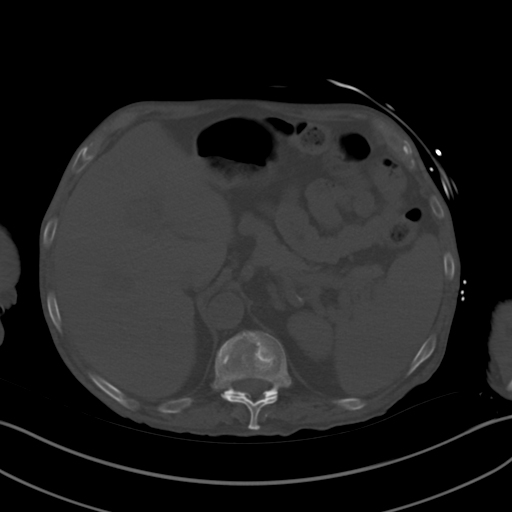
[im 83/100  soft-tissue]
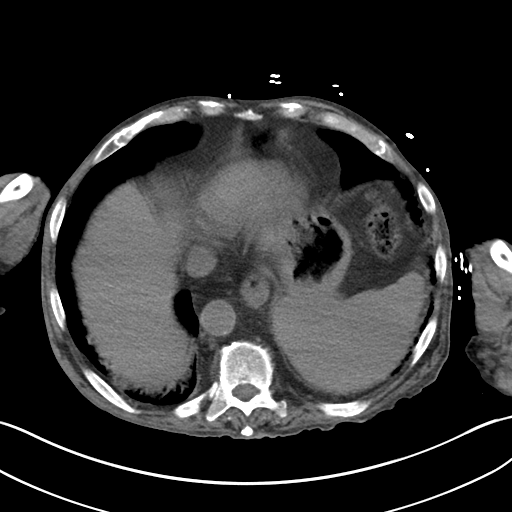
[im 91/100  soft-tissue]
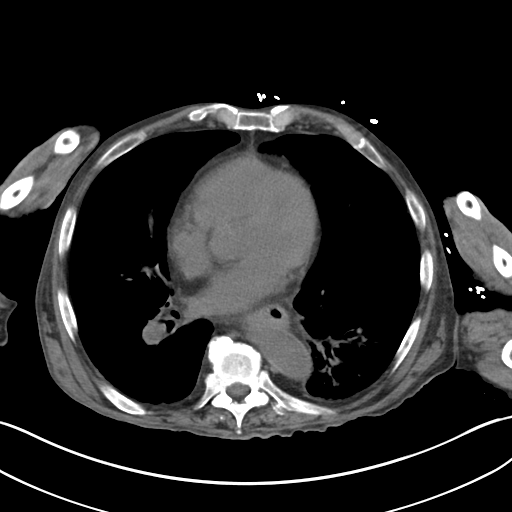

[Series 5: coronal soft tissue · coronal · 0.60mm/px · 3 of 90 slices shown]
[im 30/90  soft-tissue]
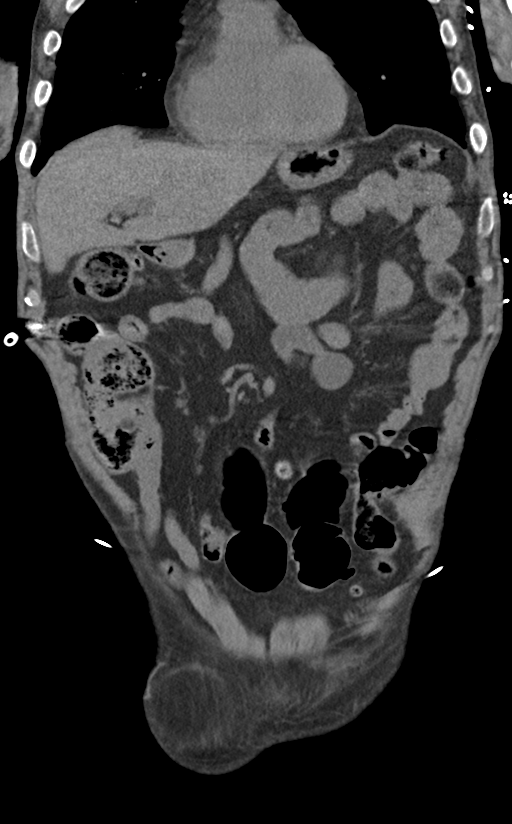
[im 40/90  soft-tissue]
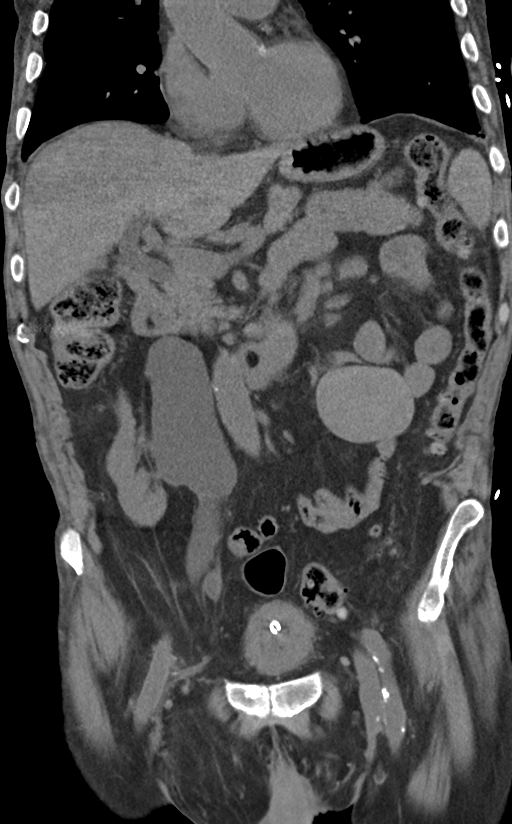
[im 50/90  soft-tissue]
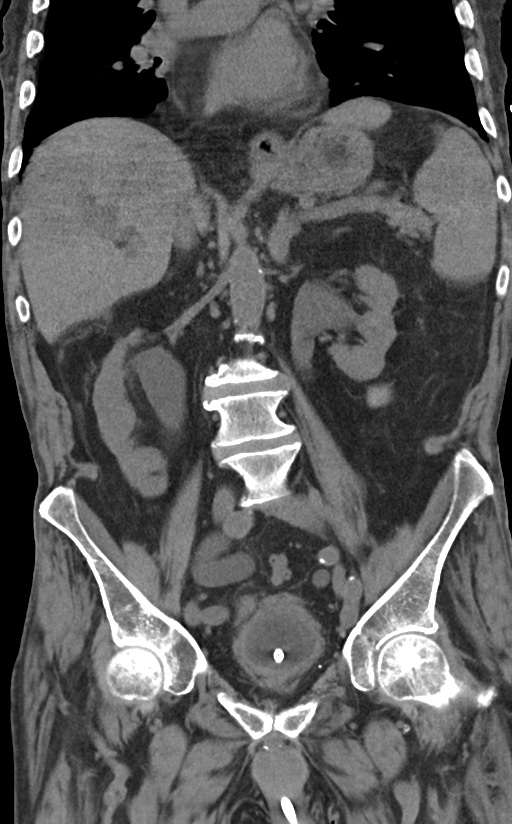

[14 of 46 positions shown; findings below may reference images not displayed]

FINDINGS: Lower chest: Hazy opacities within the bilateral lower lobes could
reflect atelectasis or pneumonias. Mild reticular densities likely
indicate underlying fibrosis. No pleural effusion. Mild distal
esophageal wall thickening similar compared to prior. Small left
infrahilar lymph node measuring 1 cm.

Hepatobiliary: No focal hepatic abnormality is seen. The gallbladder
is surgically absent. There is intra hepatic biliary dilatation and
dilated extrahepatic common bile duct similar compared to the
previous CT scan.

Pancreas: Unremarkable. No pancreatic ductal dilatation or
surrounding inflammatory changes.

Spleen: The spleen is slightly enlarged at 14 cm.

Adrenals/Urinary Tract: Bilateral adrenal glands demonstrate slight
nodular thickening but no focal mass. Kidneys are atrophic
bilaterally. Markedly dilated right extrarenal pelvis with marked
dilatation of the ureter diffusely moderate dilatation of left extra
renal pelvis, increased compared to previous CT, however this was
noted to be enlarged on nuclear medicine imaging. The left ureter is
markedly dilated, similar compared to previous CT scan. There is a 3
mm stone within the mid left kidney. No stones are identified along
the course of the left ureter. There is a Foley catheter in the
bladder. The bladder wall is very thickened and there is mild soft
tissue stranding surrounding the bladder. A small diverticulum is
again present along the right aspect of the collapsed bladder.

Stomach/Bowel: The stomach is nonenlarged. No dilated small bowel.
There is colon diverticular disease without wall thickening.

Vascular/Lymphatic: Multiple calcified phleboliths. Atherosclerosis.
The previously noted abdominal pelvic adenopathy on the comparison
CT scan has significantly decreased. There are clustered slightly
enlarged lymph nodes in the left upper abdominal mesentery.

Reproductive: The prostate gland is markedly enlarged.

Other: There is no free air or free fluid. Fatty containing ventral
hernia.

Musculoskeletal: Butterfly vertebra at T12 as before. No new
suspicious bone lesions.
IMPRESSION: 1. Mild reticular density at the bilateral lung bases suggests
chronic interstitial changes. There are hazy superimposed opacities
which may reflect atelectasis or pneumonia.
2. Markedly dilated right extrarenal pelvis with markedly dilated
right ureter similar compared to prior imaging. Moderate dilatation
of left extra renal pelvis, slightly increased; there is a very
dilated left ureter which appears similar compared to the prior CT.
There is a 3 mm stone in the left kidney. There is no stone
identified along the course of the dilated distal left ureter.
3. Foley catheter in the bladder. There is marked wall thickening of
the bladder, this could relate to a cystitis or neoplasm. Right
posterior bladder diverticulum.
4. Post cholecystectomy changes with stable intra and extrahepatic
biliary dilatation.
5. Decreased adenopathy within the abdomen pelvis compared to the
previous CT scan. Splenomegaly.
6. Markedly enlarged prostate gland.
